# Patient Record
Sex: Female | Born: 1945
Health system: Southern US, Community
[De-identification: ages and names within clinical notes are randomized; demographics above are authoritative.]

## PROBLEM LIST (undated history)

## (undated) DIAGNOSIS — I10 Essential (primary) hypertension: Secondary | ICD-10-CM

## (undated) DIAGNOSIS — N189 Chronic kidney disease, unspecified: Secondary | ICD-10-CM

## (undated) DIAGNOSIS — G20 Parkinson's disease: Secondary | ICD-10-CM

## (undated) DIAGNOSIS — M109 Gout, unspecified: Secondary | ICD-10-CM

## (undated) DIAGNOSIS — I4891 Unspecified atrial fibrillation: Secondary | ICD-10-CM

## (undated) DIAGNOSIS — E785 Hyperlipidemia, unspecified: Secondary | ICD-10-CM

## (undated) DIAGNOSIS — D649 Anemia, unspecified: Secondary | ICD-10-CM

## (undated) DIAGNOSIS — G20A1 Parkinson's disease without dyskinesia, without mention of fluctuations: Secondary | ICD-10-CM

## (undated) HISTORY — DX: Chronic kidney disease, unspecified: N18.9

## (undated) HISTORY — PX: APPENDECTOMY: SHX54

## (undated) HISTORY — DX: Anemia, unspecified: D64.9

## (undated) HISTORY — DX: Gout, unspecified: M10.9

## (undated) HISTORY — DX: Essential (primary) hypertension: I10

## (undated) HISTORY — DX: Hyperlipidemia, unspecified: E78.5

---

## 1998-05-08 ENCOUNTER — Emergency Department (HOSPITAL_COMMUNITY): Admission: EM | Admit: 1998-05-08 | Discharge: 1998-05-08 | Payer: Self-pay | Admitting: Emergency Medicine

## 1999-06-26 ENCOUNTER — Encounter: Admission: RE | Admit: 1999-06-26 | Discharge: 1999-09-24 | Payer: Self-pay | Admitting: Family Medicine

## 2000-06-02 ENCOUNTER — Other Ambulatory Visit: Admission: RE | Admit: 2000-06-02 | Discharge: 2000-06-02 | Payer: Self-pay | Admitting: Family Medicine

## 2001-06-08 ENCOUNTER — Other Ambulatory Visit: Admission: RE | Admit: 2001-06-08 | Discharge: 2001-06-08 | Payer: Self-pay | Admitting: Family Medicine

## 2002-09-05 ENCOUNTER — Other Ambulatory Visit: Admission: RE | Admit: 2002-09-05 | Discharge: 2002-09-05 | Payer: Self-pay | Admitting: Family Medicine

## 2003-08-21 ENCOUNTER — Other Ambulatory Visit: Admission: RE | Admit: 2003-08-21 | Discharge: 2003-08-21 | Payer: Self-pay | Admitting: Family Medicine

## 2004-08-20 ENCOUNTER — Ambulatory Visit: Payer: Self-pay | Admitting: Family Medicine

## 2004-08-27 ENCOUNTER — Ambulatory Visit: Payer: Self-pay | Admitting: Family Medicine

## 2004-08-27 ENCOUNTER — Other Ambulatory Visit: Admission: RE | Admit: 2004-08-27 | Discharge: 2004-08-27 | Payer: Self-pay | Admitting: Family Medicine

## 2004-09-26 ENCOUNTER — Ambulatory Visit: Payer: Self-pay | Admitting: Family Medicine

## 2004-11-13 ENCOUNTER — Ambulatory Visit: Payer: Self-pay | Admitting: Family Medicine

## 2005-02-10 ENCOUNTER — Ambulatory Visit: Payer: Self-pay | Admitting: Family Medicine

## 2005-08-28 ENCOUNTER — Ambulatory Visit: Payer: Self-pay | Admitting: Family Medicine

## 2005-09-02 ENCOUNTER — Other Ambulatory Visit: Admission: RE | Admit: 2005-09-02 | Discharge: 2005-09-02 | Payer: Self-pay | Admitting: Family Medicine

## 2005-09-02 ENCOUNTER — Ambulatory Visit: Payer: Self-pay | Admitting: Family Medicine

## 2005-09-02 ENCOUNTER — Encounter: Admission: RE | Admit: 2005-09-02 | Discharge: 2005-09-02 | Payer: Self-pay | Admitting: Family Medicine

## 2005-09-11 ENCOUNTER — Ambulatory Visit: Payer: Self-pay | Admitting: Family Medicine

## 2005-11-24 ENCOUNTER — Ambulatory Visit: Payer: Self-pay | Admitting: Family Medicine

## 2005-12-02 ENCOUNTER — Ambulatory Visit: Payer: Self-pay | Admitting: Family Medicine

## 2006-06-01 ENCOUNTER — Ambulatory Visit: Payer: Self-pay | Admitting: Family Medicine

## 2006-09-04 ENCOUNTER — Ambulatory Visit: Payer: Self-pay | Admitting: Family Medicine

## 2006-09-04 LAB — CONVERTED CEMR LAB
ALT: 19 units/L (ref 0–40)
AST: 18 units/L (ref 0–37)
Albumin: 3.9 g/dL (ref 3.5–5.2)
Alkaline Phosphatase: 87 units/L (ref 39–117)
BUN: 23 mg/dL (ref 6–23)
Basophils Absolute: 0 10*3/uL (ref 0.0–0.1)
Basophils Relative: 0.4 % (ref 0.0–1.0)
CO2: 25 meq/L (ref 19–32)
Calcium: 9.7 mg/dL (ref 8.4–10.5)
Chloride: 108 meq/L (ref 96–112)
Chol/HDL Ratio, serum: 3.8
Cholesterol: 139 mg/dL (ref 0–200)
Creatinine, Ser: 1.2 mg/dL (ref 0.4–1.2)
Creatinine,U: 153.5 mg/dL
Eosinophil percent: 3.8 % (ref 0.0–5.0)
GFR calc non Af Amer: 49 mL/min
Glomerular Filtration Rate, Af Am: 59 mL/min/{1.73_m2}
Glucose, Bld: 139 mg/dL — ABNORMAL HIGH (ref 70–99)
HCT: 32.6 % — ABNORMAL LOW (ref 36.0–46.0)
HDL: 36.3 mg/dL — ABNORMAL LOW (ref >39.0)
Hemoglobin: 10.9 g/dL — ABNORMAL LOW (ref 12.0–15.0)
Hgb A1c MFr Bld: 6.7 % — ABNORMAL HIGH (ref 4.6–6.0)
LDL Cholesterol: 66 mg/dL (ref 0–99)
Lymphocytes Relative: 22 % (ref 12.0–46.0)
MCHC: 33.5 g/dL (ref 30.0–36.0)
MCV: 83.3 fL (ref 78.0–100.0)
Microalb Creat Ratio: 5.9 mg/g (ref 0.0–30.0)
Microalb, Ur: 0.9 mg/dL (ref 0.0–1.9)
Monocytes Absolute: 0.5 10*3/uL (ref 0.2–0.7)
Monocytes Relative: 6.6 % (ref 3.0–11.0)
Neutro Abs: 5.3 10*3/uL (ref 1.4–7.7)
Neutrophils Relative %: 67.2 % (ref 43.0–77.0)
Platelets: 242 10*3/uL (ref 150–400)
Potassium: 4.3 meq/L (ref 3.5–5.1)
RBC: 3.92 M/uL (ref 3.87–5.11)
RDW: 13.7 % (ref 11.5–14.6)
Sodium: 141 meq/L (ref 135–145)
TSH: 1.4 microintl units/mL (ref 0.35–5.50)
Total Bilirubin: 0.6 mg/dL (ref 0.3–1.2)
Total Protein: 6.8 g/dL (ref 6.0–8.3)
Triglyceride fasting, serum: 184 mg/dL — ABNORMAL HIGH (ref 0–149)
VLDL: 37 mg/dL (ref 0–40)
WBC: 7.8 10*3/uL (ref 4.5–10.5)

## 2006-09-11 ENCOUNTER — Ambulatory Visit: Payer: Self-pay | Admitting: Family Medicine

## 2006-09-11 ENCOUNTER — Other Ambulatory Visit: Admission: RE | Admit: 2006-09-11 | Discharge: 2006-09-11 | Payer: Self-pay | Admitting: Family Medicine

## 2006-09-11 ENCOUNTER — Encounter (INDEPENDENT_AMBULATORY_CARE_PROVIDER_SITE_OTHER): Payer: Self-pay | Admitting: Specialist

## 2006-09-11 LAB — CONVERTED CEMR LAB
Ferritin: 69.5 ng/mL (ref 10.0–291.0)
Folate: 9.7 ng/mL
Iron: 53 ug/dL (ref 42–145)
Saturation Ratios: 14.7 % — ABNORMAL LOW (ref 20.0–50.0)
Transferrin: 258.1 mg/dL (ref >=212.0)
Vitamin B-12: 277 pg/mL (ref 211–911)

## 2006-09-25 ENCOUNTER — Ambulatory Visit: Payer: Self-pay | Admitting: Family Medicine

## 2006-10-09 ENCOUNTER — Ambulatory Visit: Payer: Self-pay | Admitting: Family Medicine

## 2006-10-09 LAB — CONVERTED CEMR LAB
Basophils Absolute: 0.1 10*3/uL (ref 0.0–0.1)
Basophils Relative: 0.8 % (ref 0.0–1.0)
Eosinophil percent: 3.9 % (ref 0.0–5.0)
HCT: 29.7 % — ABNORMAL LOW (ref 36.0–46.0)
Hemoglobin: 10 g/dL — ABNORMAL LOW (ref 12.0–15.0)
Lymphocytes Relative: 20.5 % (ref 12.0–46.0)
MCHC: 33.5 g/dL (ref 30.0–36.0)
MCV: 83.4 fL (ref 78.0–100.0)
Monocytes Absolute: 0.4 10*3/uL (ref 0.2–0.7)
Monocytes Relative: 6.7 % (ref 3.0–11.0)
Neutro Abs: 4.4 10*3/uL (ref 1.4–7.7)
Neutrophils Relative %: 68.1 % (ref 43.0–77.0)
Platelets: 198 10*3/uL (ref 150–400)
RBC: 3.56 M/uL — ABNORMAL LOW (ref 3.87–5.11)
RDW: 13.7 % (ref 11.5–14.6)
WBC: 6.6 10*3/uL (ref 4.5–10.5)

## 2006-10-20 ENCOUNTER — Ambulatory Visit: Payer: Self-pay | Admitting: Oncology

## 2006-12-02 LAB — CBC WITH DIFFERENTIAL/PLATELET
BASO%: 0.4 % (ref 0.0–2.0)
Eosinophils Absolute: 0.3 10*3/uL (ref 0.0–0.5)
MCHC: 34.5 g/dL (ref 32.0–36.0)
MONO#: 0.4 10*3/uL (ref 0.1–0.9)
NEUT#: 5.1 10*3/uL (ref 1.5–6.5)
RBC: 3.71 10*6/uL (ref 3.70–5.32)
RDW: 14.5 % (ref 11.3–14.5)
WBC: 7.4 10*3/uL (ref 3.9–10.0)
lymph#: 1.6 10*3/uL (ref 0.9–3.3)

## 2006-12-02 LAB — CHCC SMEAR

## 2006-12-04 LAB — COMPREHENSIVE METABOLIC PANEL
ALT: 16 U/L (ref 0–35)
AST: 12 U/L (ref 0–37)
CO2: 21 mEq/L (ref 19–32)
Calcium: 9.6 mg/dL (ref 8.4–10.5)
Chloride: 109 mEq/L (ref 96–112)
Sodium: 140 mEq/L (ref 135–145)
Total Bilirubin: 0.4 mg/dL (ref 0.3–1.2)
Total Protein: 6.6 g/dL (ref 6.0–8.3)

## 2006-12-04 LAB — FOLATE: Folate: 10.5 ng/mL

## 2006-12-04 LAB — PROTEIN ELECTROPHORESIS, SERUM
Alpha-1-Globulin: 4.5 % (ref 2.9–4.9)
Alpha-2-Globulin: 13.7 % — ABNORMAL HIGH (ref 7.1–11.8)
Beta 2: 5.5 % (ref 3.2–6.5)
Gamma Globulin: 11.7 % (ref 11.1–18.8)

## 2006-12-04 LAB — IGG, IGA, IGM
IgA: 141 mg/dL (ref 68–378)
IgG (Immunoglobin G), Serum: 763 mg/dL (ref 694–1618)
IgM, Serum: 80 mg/dL (ref 60–263)

## 2006-12-04 LAB — ERYTHROPOIETIN: Erythropoietin: 9.9 m[IU]/mL (ref 2.6–34.0)

## 2006-12-04 LAB — VITAMIN B12: Vitamin B-12: 337 pg/mL (ref 211–911)

## 2006-12-04 LAB — IRON AND TIBC
TIBC: 332 ug/dL (ref 250–470)
UIBC: 279 ug/dL

## 2006-12-28 ENCOUNTER — Ambulatory Visit: Payer: Self-pay | Admitting: Oncology

## 2006-12-31 LAB — CBC WITH DIFFERENTIAL/PLATELET
Basophils Absolute: 0 10*3/uL (ref 0.0–0.1)
Eosinophils Absolute: 0.3 10*3/uL (ref 0.0–0.5)
HGB: 10.3 g/dL — ABNORMAL LOW (ref 11.6–15.9)
MONO#: 0.4 10*3/uL (ref 0.1–0.9)
NEUT#: 4.7 10*3/uL (ref 1.5–6.5)
RBC: 3.65 10*6/uL — ABNORMAL LOW (ref 3.70–5.32)
RDW: 14.4 % (ref 11.3–14.5)
WBC: 6.9 10*3/uL (ref 3.9–10.0)
lymph#: 1.6 10*3/uL (ref 0.9–3.3)

## 2007-03-15 ENCOUNTER — Ambulatory Visit: Payer: Self-pay | Admitting: Oncology

## 2007-03-17 LAB — CBC WITH DIFFERENTIAL/PLATELET
Basophils Absolute: 0 10*3/uL (ref 0.0–0.1)
Eosinophils Absolute: 0.3 10*3/uL (ref 0.0–0.5)
HCT: 29.7 % — ABNORMAL LOW (ref 34.8–46.6)
HGB: 10.3 g/dL — ABNORMAL LOW (ref 11.6–15.9)
LYMPH%: 23.5 % (ref 14.0–48.0)
MCV: 80.6 fL — ABNORMAL LOW (ref 81.0–101.0)
MONO#: 0.4 10*3/uL (ref 0.1–0.9)
NEUT#: 5.4 10*3/uL (ref 1.5–6.5)
NEUT%: 67.8 % (ref 39.6–76.8)
Platelets: 214 10*3/uL (ref 145–400)
RBC: 3.68 10*6/uL — ABNORMAL LOW (ref 3.70–5.32)
WBC: 7.9 10*3/uL (ref 3.9–10.0)

## 2007-07-26 ENCOUNTER — Ambulatory Visit: Payer: Self-pay | Admitting: Oncology

## 2007-07-28 LAB — CBC WITH DIFFERENTIAL/PLATELET
BASO%: 0.3 % (ref 0.0–2.0)
HCT: 28.7 % — ABNORMAL LOW (ref 34.8–46.6)
LYMPH%: 19.7 % (ref 14.0–48.0)
MCHC: 35 g/dL (ref 32.0–36.0)
MCV: 81.6 fL (ref 81.0–101.0)
MONO#: 0.4 10*3/uL (ref 0.1–0.9)
MONO%: 4.5 % (ref 0.0–13.0)
NEUT%: 72.3 % (ref 39.6–76.8)
Platelets: 222 10*3/uL (ref 145–400)
WBC: 8.2 10*3/uL (ref 3.9–10.0)

## 2007-09-14 ENCOUNTER — Ambulatory Visit: Payer: Self-pay | Admitting: Family Medicine

## 2007-09-14 LAB — CONVERTED CEMR LAB
ALT: 15 units/L (ref 0–35)
AST: 13 units/L (ref 0–37)
Albumin: 3.6 g/dL (ref 3.5–5.2)
Alkaline Phosphatase: 69 units/L (ref 39–117)
BUN: 16 mg/dL (ref 6–23)
Basophils Absolute: 0.1 10*3/uL (ref 0.0–0.1)
Basophils Relative: 0.9 % (ref 0.0–1.0)
Bilirubin Urine: NEGATIVE
Bilirubin, Direct: 0.1 mg/dL (ref 0.0–0.3)
Blood in Urine, dipstick: NEGATIVE
CO2: 26 meq/L (ref 19–32)
Calcium: 9.7 mg/dL (ref 8.4–10.5)
Chloride: 103 meq/L (ref 96–112)
Cholesterol: 143 mg/dL (ref 0–200)
Creatinine, Ser: 1.4 mg/dL — ABNORMAL HIGH (ref 0.4–1.2)
Creatinine,U: 126.3 mg/dL
Eosinophils Absolute: 0.4 10*3/uL (ref 0.0–0.6)
Eosinophils Relative: 4.4 % (ref 0.0–5.0)
GFR calc Af Amer: 49 mL/min
GFR calc non Af Amer: 40 mL/min
Glucose, Bld: 103 mg/dL — ABNORMAL HIGH (ref 70–99)
Glucose, Urine, Semiquant: NEGATIVE
HCT: 29.8 % — ABNORMAL LOW (ref 36.0–46.0)
HDL: 34.6 mg/dL — ABNORMAL LOW (ref >39.0)
Hemoglobin: 10.1 g/dL — ABNORMAL LOW (ref 12.0–15.0)
Hgb A1c MFr Bld: 6.3 % — ABNORMAL HIGH (ref 4.6–6.0)
Ketones, urine, test strip: NEGATIVE
LDL Cholesterol: 69 mg/dL (ref 0–99)
Lymphocytes Relative: 18.7 % (ref 12.0–46.0)
MCHC: 34 g/dL (ref 30.0–36.0)
MCV: 83.1 fL (ref 78.0–100.0)
Microalb Creat Ratio: 5.5 mg/g (ref 0.0–30.0)
Microalb, Ur: 0.7 mg/dL (ref 0.0–1.9)
Monocytes Absolute: 0.5 10*3/uL (ref 0.2–0.7)
Monocytes Relative: 5.8 % (ref 3.0–11.0)
Neutro Abs: 5.7 10*3/uL (ref 1.4–7.7)
Neutrophils Relative %: 70.2 % (ref 43.0–77.0)
Nitrite: NEGATIVE
Platelets: 204 10*3/uL (ref 150–400)
Potassium: 4.3 meq/L (ref 3.5–5.1)
Protein, U semiquant: NEGATIVE
RBC: 3.59 M/uL — ABNORMAL LOW (ref 3.87–5.11)
RDW: 13.5 % (ref 11.5–14.6)
Sodium: 139 meq/L (ref 135–145)
Specific Gravity, Urine: 1.015
TSH: 1.3 microintl units/mL (ref 0.35–5.50)
Total Bilirubin: 0.4 mg/dL (ref 0.3–1.2)
Total CHOL/HDL Ratio: 4.1
Total Protein: 6.1 g/dL (ref 6.0–8.3)
Triglycerides: 196 mg/dL — ABNORMAL HIGH (ref 0–149)
Urobilinogen, UA: 0.2
VLDL: 39 mg/dL (ref 0–40)
WBC Urine, dipstick: NEGATIVE
WBC: 8.2 10*3/uL (ref 4.5–10.5)
pH: 5

## 2007-09-21 ENCOUNTER — Other Ambulatory Visit: Admission: RE | Admit: 2007-09-21 | Discharge: 2007-09-21 | Payer: Self-pay | Admitting: Family Medicine

## 2007-09-21 ENCOUNTER — Encounter: Payer: Self-pay | Admitting: Family Medicine

## 2007-09-21 ENCOUNTER — Ambulatory Visit: Payer: Self-pay | Admitting: Family Medicine

## 2007-09-21 DIAGNOSIS — E782 Mixed hyperlipidemia: Secondary | ICD-10-CM | POA: Insufficient documentation

## 2007-09-21 DIAGNOSIS — D649 Anemia, unspecified: Secondary | ICD-10-CM | POA: Insufficient documentation

## 2007-09-21 DIAGNOSIS — E139 Other specified diabetes mellitus without complications: Secondary | ICD-10-CM | POA: Insufficient documentation

## 2007-09-21 DIAGNOSIS — I10 Essential (primary) hypertension: Secondary | ICD-10-CM | POA: Insufficient documentation

## 2007-12-20 ENCOUNTER — Ambulatory Visit: Payer: Self-pay | Admitting: Oncology

## 2007-12-22 LAB — FERRITIN: Ferritin: 64 ng/mL (ref 10–291)

## 2007-12-22 LAB — COMPREHENSIVE METABOLIC PANEL
ALT: 10 U/L (ref 0–35)
CO2: 22 mEq/L (ref 19–32)
Sodium: 142 mEq/L (ref 135–145)
Total Bilirubin: 0.4 mg/dL (ref 0.3–1.2)
Total Protein: 6.4 g/dL (ref 6.0–8.3)

## 2007-12-22 LAB — CBC WITH DIFFERENTIAL/PLATELET
BASO%: 0.3 % (ref 0.0–2.0)
LYMPH%: 19.6 % (ref 14.0–48.0)
MCHC: 34.6 g/dL (ref 32.0–36.0)
MONO#: 0.3 10*3/uL (ref 0.1–0.9)
Platelets: 212 10*3/uL (ref 145–400)
RBC: 3.61 10*6/uL — ABNORMAL LOW (ref 3.70–5.32)
WBC: 6.4 10*3/uL (ref 3.9–10.0)
lymph#: 1.3 10*3/uL (ref 0.9–3.3)

## 2007-12-22 LAB — IRON AND TIBC: Iron: 46 ug/dL (ref 42–145)

## 2008-03-21 ENCOUNTER — Ambulatory Visit: Payer: Self-pay | Admitting: Family Medicine

## 2008-03-21 ENCOUNTER — Ambulatory Visit: Payer: Self-pay | Admitting: Oncology

## 2008-03-21 LAB — CONVERTED CEMR LAB
BUN: 23 mg/dL (ref 6–23)
Basophils Absolute: 0 10*3/uL (ref 0.0–0.1)
Basophils Relative: 0.1 % (ref 0.0–1.0)
CO2: 23 meq/L (ref 19–32)
Calcium: 9.3 mg/dL (ref 8.4–10.5)
Chloride: 106 meq/L (ref 96–112)
Creatinine, Ser: 1.6 mg/dL — ABNORMAL HIGH (ref 0.4–1.2)
Eosinophils Absolute: 0.3 10*3/uL (ref 0.0–0.7)
Eosinophils Relative: 3.9 % (ref 0.0–5.0)
GFR calc Af Amer: 42 mL/min
GFR calc non Af Amer: 35 mL/min
Glucose, Bld: 112 mg/dL — ABNORMAL HIGH (ref 70–99)
HCT: 29.2 % — ABNORMAL LOW (ref 36.0–46.0)
Hemoglobin: 10 g/dL — ABNORMAL LOW (ref 12.0–15.0)
Hgb A1c MFr Bld: 6.4 % — ABNORMAL HIGH (ref 4.6–6.0)
Iron: 64 ug/dL (ref 42–145)
Lymphocytes Relative: 18 % (ref 12.0–46.0)
MCHC: 34.4 g/dL (ref 30.0–36.0)
MCV: 81.7 fL (ref 78.0–100.0)
Monocytes Absolute: 0.3 10*3/uL (ref 0.1–1.0)
Monocytes Relative: 4.6 % (ref 3.0–12.0)
Neutro Abs: 5.2 10*3/uL (ref 1.4–7.7)
Neutrophils Relative %: 73.4 % (ref 43.0–77.0)
Platelets: 267 10*3/uL (ref 150–400)
Potassium: 4.4 meq/L (ref 3.5–5.1)
RBC: 3.57 M/uL — ABNORMAL LOW (ref 3.87–5.11)
RDW: 13.3 % (ref 11.5–14.6)
Saturation Ratios: 19 % — ABNORMAL LOW (ref 20.0–50.0)
Sodium: 138 meq/L (ref 135–145)
Transferrin: 241.1 mg/dL (ref >=212.0)
WBC: 7.1 10*3/uL (ref 4.5–10.5)

## 2008-03-22 ENCOUNTER — Telehealth: Payer: Self-pay | Admitting: Family Medicine

## 2008-06-02 ENCOUNTER — Ambulatory Visit: Payer: Self-pay | Admitting: Oncology

## 2008-06-06 LAB — CBC WITH DIFFERENTIAL/PLATELET
BASO%: 0.5 % (ref 0.0–2.0)
EOS%: 4.5 % (ref 0.0–7.0)
LYMPH%: 26 % (ref 14.0–48.0)
MCHC: 34.6 g/dL (ref 32.0–36.0)
MONO#: 0.5 10*3/uL (ref 0.1–0.9)
Platelets: 205 10*3/uL (ref 145–400)
RBC: 3.76 10*6/uL (ref 3.70–5.32)
WBC: 7.7 10*3/uL (ref 3.9–10.0)
lymph#: 2 10*3/uL (ref 0.9–3.3)

## 2008-06-06 LAB — COMPREHENSIVE METABOLIC PANEL
ALT: 17 U/L (ref 0–35)
AST: 11 U/L (ref 0–37)
Alkaline Phosphatase: 74 U/L (ref 39–117)
CO2: 20 mEq/L (ref 19–32)
Sodium: 139 mEq/L (ref 135–145)
Total Bilirubin: 0.4 mg/dL (ref 0.3–1.2)
Total Protein: 6.7 g/dL (ref 6.0–8.3)

## 2008-06-06 LAB — IRON AND TIBC
%SAT: 22 % (ref 20–55)
TIBC: 330 ug/dL (ref 250–470)
UIBC: 256 ug/dL

## 2008-06-06 LAB — FERRITIN: Ferritin: 188 ng/mL (ref 10–291)

## 2008-07-18 ENCOUNTER — Ambulatory Visit: Payer: Self-pay | Admitting: Family Medicine

## 2008-09-01 ENCOUNTER — Ambulatory Visit: Payer: Self-pay | Admitting: Oncology

## 2008-09-05 LAB — COMPREHENSIVE METABOLIC PANEL
Alkaline Phosphatase: 75 U/L (ref 39–117)
BUN: 26 mg/dL — ABNORMAL HIGH (ref 6–23)
Glucose, Bld: 121 mg/dL — ABNORMAL HIGH (ref 70–99)
Total Bilirubin: 0.4 mg/dL (ref 0.3–1.2)

## 2008-09-05 LAB — CBC WITH DIFFERENTIAL/PLATELET
Basophils Absolute: 0.1 10*3/uL (ref 0.0–0.1)
Eosinophils Absolute: 0.3 10*3/uL (ref 0.0–0.5)
HGB: 10.5 g/dL — ABNORMAL LOW (ref 11.6–15.9)
LYMPH%: 22.5 % (ref 14.0–48.0)
MCV: 84.5 fL (ref 81.0–101.0)
MONO%: 5.4 % (ref 0.0–13.0)
NEUT#: 4.8 10*3/uL (ref 1.5–6.5)
Platelets: 212 10*3/uL (ref 145–400)

## 2008-09-05 LAB — FERRITIN: Ferritin: 171 ng/mL (ref 10–291)

## 2008-09-05 LAB — IRON AND TIBC
TIBC: 319 ug/dL (ref 250–470)
UIBC: 259 ug/dL

## 2008-09-14 ENCOUNTER — Ambulatory Visit: Payer: Self-pay | Admitting: Family Medicine

## 2008-09-14 LAB — CONVERTED CEMR LAB
ALT: 13 units/L (ref 0–35)
AST: 16 units/L (ref 0–37)
Albumin: 3.8 g/dL (ref 3.5–5.2)
Alkaline Phosphatase: 59 units/L (ref 39–117)
BUN: 24 mg/dL — ABNORMAL HIGH (ref 6–23)
Basophils Absolute: 0 10*3/uL (ref 0.0–0.1)
Basophils Relative: 0.6 % (ref 0.0–3.0)
Bilirubin Urine: NEGATIVE
Bilirubin, Direct: 0.1 mg/dL (ref 0.0–0.3)
Blood in Urine, dipstick: NEGATIVE
CO2: 26 meq/L (ref 19–32)
Calcium: 9.6 mg/dL (ref 8.4–10.5)
Chloride: 111 meq/L (ref 96–112)
Cholesterol: 126 mg/dL (ref 0–200)
Creatinine, Ser: 1.5 mg/dL — ABNORMAL HIGH (ref 0.4–1.2)
Creatinine,U: 162 mg/dL
Eosinophils Absolute: 0.2 10*3/uL (ref 0.0–0.7)
Eosinophils Relative: 3.7 % (ref 0.0–5.0)
GFR calc Af Amer: 45 mL/min
GFR calc non Af Amer: 37 mL/min
Glucose, Bld: 75 mg/dL (ref 70–99)
Glucose, Urine, Semiquant: NEGATIVE
HCT: 31.4 % — ABNORMAL LOW (ref 36.0–46.0)
HDL: 34.3 mg/dL — ABNORMAL LOW (ref >39.0)
Hemoglobin: 10.7 g/dL — ABNORMAL LOW (ref 12.0–15.0)
Hgb A1c MFr Bld: 6.2 % — ABNORMAL HIGH (ref 4.6–6.0)
Ketones, urine, test strip: NEGATIVE
LDL Cholesterol: 54 mg/dL (ref 0–99)
Lymphocytes Relative: 21.6 % (ref 12.0–46.0)
MCHC: 34.2 g/dL (ref 30.0–36.0)
MCV: 84.7 fL (ref 78.0–100.0)
Microalb Creat Ratio: 5.6 mg/g (ref 0.0–30.0)
Microalb, Ur: 0.9 mg/dL (ref 0.0–1.9)
Monocytes Absolute: 0.4 10*3/uL (ref 0.1–1.0)
Monocytes Relative: 7.1 % (ref 3.0–12.0)
Neutro Abs: 3.9 10*3/uL (ref 1.4–7.7)
Neutrophils Relative %: 67 % (ref 43.0–77.0)
Nitrite: NEGATIVE
Platelets: 195 10*3/uL (ref 150–400)
Potassium: 4.4 meq/L (ref 3.5–5.1)
RBC: 3.71 M/uL — ABNORMAL LOW (ref 3.87–5.11)
RDW: 13.4 % (ref 11.5–14.6)
Sodium: 144 meq/L (ref 135–145)
Specific Gravity, Urine: 1.02
TSH: 1.16 microintl units/mL (ref 0.35–5.50)
Total Bilirubin: 0.6 mg/dL (ref 0.3–1.2)
Total CHOL/HDL Ratio: 3.7
Total Protein: 6.7 g/dL (ref 6.0–8.3)
Triglycerides: 189 mg/dL — ABNORMAL HIGH (ref 0–149)
Urobilinogen, UA: 0.2
VLDL: 38 mg/dL (ref 0–40)
WBC: 5.8 10*3/uL (ref 4.5–10.5)
pH: 5

## 2008-10-03 ENCOUNTER — Ambulatory Visit: Payer: Self-pay | Admitting: Family Medicine

## 2008-10-03 ENCOUNTER — Other Ambulatory Visit: Admission: RE | Admit: 2008-10-03 | Discharge: 2008-10-03 | Payer: Self-pay | Admitting: Family Medicine

## 2009-03-02 ENCOUNTER — Ambulatory Visit: Payer: Self-pay | Admitting: Oncology

## 2009-03-06 LAB — COMPREHENSIVE METABOLIC PANEL
Albumin: 4.2 g/dL (ref 3.5–5.2)
BUN: 22 mg/dL (ref 6–23)
Calcium: 9.3 mg/dL (ref 8.4–10.5)
Chloride: 107 mEq/L (ref 96–112)
Creatinine, Ser: 1.57 mg/dL — ABNORMAL HIGH (ref 0.40–1.20)
Glucose, Bld: 163 mg/dL — ABNORMAL HIGH (ref 70–99)
Potassium: 4.6 mEq/L (ref 3.5–5.3)

## 2009-03-06 LAB — CBC WITH DIFFERENTIAL/PLATELET
Basophils Absolute: 0 10*3/uL (ref 0.0–0.1)
EOS%: 4.1 % (ref 0.0–7.0)
Eosinophils Absolute: 0.3 10*3/uL (ref 0.0–0.5)
HCT: 30.9 % — ABNORMAL LOW (ref 34.8–46.6)
HGB: 10.5 g/dL — ABNORMAL LOW (ref 11.6–15.9)
MCH: 28.7 pg (ref 25.1–34.0)
MCV: 84.3 fL (ref 79.5–101.0)
MONO%: 5.8 % (ref 0.0–14.0)
NEUT#: 4.2 10*3/uL (ref 1.5–6.5)
NEUT%: 66.5 % (ref 38.4–76.8)
RDW: 14.5 % (ref 11.2–14.5)
lymph#: 1.5 10*3/uL (ref 0.9–3.3)

## 2009-03-06 LAB — IRON AND TIBC
%SAT: 23 % (ref 20–55)
TIBC: 321 ug/dL (ref 250–470)
UIBC: 248 ug/dL

## 2009-03-20 ENCOUNTER — Ambulatory Visit: Payer: Self-pay | Admitting: Family Medicine

## 2009-03-20 LAB — CONVERTED CEMR LAB
BUN: 28 mg/dL — ABNORMAL HIGH (ref 6–23)
CO2: 25 meq/L (ref 19–32)
Calcium: 9.4 mg/dL (ref 8.4–10.5)
Chloride: 116 meq/L — ABNORMAL HIGH (ref 96–112)
Creatinine, Ser: 1.6 mg/dL — ABNORMAL HIGH (ref 0.4–1.2)
GFR calc non Af Amer: 34.46 mL/min (ref >60)
Glucose, Bld: 86 mg/dL (ref 70–99)
Hgb A1c MFr Bld: 6.1 % (ref 4.6–6.5)
Potassium: 4.7 meq/L (ref 3.5–5.1)
Sodium: 143 meq/L (ref 135–145)

## 2009-03-27 ENCOUNTER — Ambulatory Visit: Payer: Self-pay | Admitting: Family Medicine

## 2009-03-27 DIAGNOSIS — N189 Chronic kidney disease, unspecified: Secondary | ICD-10-CM | POA: Insufficient documentation

## 2009-06-20 ENCOUNTER — Encounter: Payer: Self-pay | Admitting: *Deleted

## 2009-07-30 ENCOUNTER — Ambulatory Visit: Payer: Self-pay | Admitting: Family Medicine

## 2009-08-13 ENCOUNTER — Ambulatory Visit: Payer: Self-pay | Admitting: Oncology

## 2009-08-15 LAB — CBC WITH DIFFERENTIAL/PLATELET
Basophils Absolute: 0 10*3/uL (ref 0.0–0.1)
HCT: 31.6 % — ABNORMAL LOW (ref 34.8–46.6)
HGB: 10.6 g/dL — ABNORMAL LOW (ref 11.6–15.9)
MCH: 28.7 pg (ref 25.1–34.0)
MONO#: 0.4 10*3/uL (ref 0.1–0.9)
NEUT%: 67.1 % (ref 38.4–76.8)
WBC: 6.9 10*3/uL (ref 3.9–10.3)
lymph#: 1.5 10*3/uL (ref 0.9–3.3)

## 2009-08-17 LAB — IRON AND TIBC
%SAT: 17 % — ABNORMAL LOW (ref 20–55)
Iron: 56 ug/dL (ref 42–145)
TIBC: 339 ug/dL (ref 250–470)
UIBC: 283 ug/dL

## 2009-08-17 LAB — SPEP & IFE WITH QIG
Albumin ELP: 58.4 % (ref 55.8–66.1)
Alpha-1-Globulin: 4.7 % (ref 2.9–4.9)
Beta 2: 4 % (ref 3.2–6.5)
Beta Globulin: 7.5 % — ABNORMAL HIGH (ref 4.7–7.2)
IgA: 127 mg/dL (ref 68–378)
Total Protein, Serum Electrophoresis: 6.6 g/dL (ref 6.0–8.3)

## 2009-08-17 LAB — COMPREHENSIVE METABOLIC PANEL
BUN: 28 mg/dL — ABNORMAL HIGH (ref 6–23)
CO2: 22 mEq/L (ref 19–32)
Calcium: 9.5 mg/dL (ref 8.4–10.5)
Chloride: 108 mEq/L (ref 96–112)
Creatinine, Ser: 1.61 mg/dL — ABNORMAL HIGH (ref 0.40–1.20)

## 2009-08-17 LAB — ERYTHROPOIETIN: Erythropoietin: 6.4 m[IU]/mL (ref 2.6–34.0)

## 2009-08-17 LAB — FERRITIN: Ferritin: 114 ng/mL (ref 10–291)

## 2009-08-20 ENCOUNTER — Encounter: Payer: Self-pay | Admitting: Family Medicine

## 2009-08-20 LAB — HM MAMMOGRAPHY

## 2009-09-28 ENCOUNTER — Ambulatory Visit: Payer: Self-pay | Admitting: Family Medicine

## 2009-09-28 LAB — CONVERTED CEMR LAB
ALT: 16 units/L (ref 0–35)
AST: 17 units/L (ref 0–37)
Albumin: 3.8 g/dL (ref 3.5–5.2)
Alkaline Phosphatase: 50 units/L (ref 39–117)
BUN: 27 mg/dL — ABNORMAL HIGH (ref 6–23)
Basophils Absolute: 0.1 10*3/uL (ref 0.0–0.1)
Basophils Relative: 0.8 % (ref 0.0–3.0)
Bilirubin Urine: NEGATIVE
Bilirubin, Direct: 0 mg/dL (ref 0.0–0.3)
Blood in Urine, dipstick: NEGATIVE
CO2: 24 meq/L (ref 19–32)
Calcium: 9.5 mg/dL (ref 8.4–10.5)
Chloride: 111 meq/L (ref 96–112)
Cholesterol: 122 mg/dL (ref 0–200)
Creatinine, Ser: 1.6 mg/dL — ABNORMAL HIGH (ref 0.4–1.2)
Creatinine,U: 140.1 mg/dL
Eosinophils Absolute: 0.4 10*3/uL (ref 0.0–0.7)
Eosinophils Relative: 5.5 % — ABNORMAL HIGH (ref 0.0–5.0)
GFR calc non Af Amer: 34.4 mL/min (ref >60)
Glucose, Bld: 87 mg/dL (ref 70–99)
Glucose, Urine, Semiquant: NEGATIVE
HCT: 30.9 % — ABNORMAL LOW (ref 36.0–46.0)
HDL: 40.5 mg/dL (ref >39.00)
Hemoglobin: 10.3 g/dL — ABNORMAL LOW (ref 12.0–15.0)
Hgb A1c MFr Bld: 6.4 % (ref 4.6–6.5)
Ketones, urine, test strip: NEGATIVE
LDL Cholesterol: 47 mg/dL (ref 0–99)
Lymphocytes Relative: 22.6 % (ref 12.0–46.0)
Lymphs Abs: 1.5 10*3/uL (ref 0.7–4.0)
MCHC: 33.5 g/dL (ref 30.0–36.0)
MCV: 87.7 fL (ref 78.0–100.0)
Microalb Creat Ratio: 7.1 mg/g (ref 0.0–30.0)
Microalb, Ur: 1 mg/dL (ref 0.0–1.9)
Monocytes Absolute: 0.4 10*3/uL (ref 0.1–1.0)
Monocytes Relative: 5.9 % (ref 3.0–12.0)
Neutro Abs: 4.3 10*3/uL (ref 1.4–7.7)
Neutrophils Relative %: 65.2 % (ref 43.0–77.0)
Nitrite: NEGATIVE
Platelets: 168 10*3/uL (ref 150.0–400.0)
Potassium: 4.8 meq/L (ref 3.5–5.1)
Protein, U semiquant: NEGATIVE
RBC: 3.52 M/uL — ABNORMAL LOW (ref 3.87–5.11)
RDW: 13.5 % (ref 11.5–14.6)
Sodium: 143 meq/L (ref 135–145)
Specific Gravity, Urine: 1.02
TSH: 0.86 microintl units/mL (ref 0.35–5.50)
Total Bilirubin: 0.6 mg/dL (ref 0.3–1.2)
Total CHOL/HDL Ratio: 3
Total Protein: 6.5 g/dL (ref 6.0–8.3)
Triglycerides: 173 mg/dL — ABNORMAL HIGH (ref 0.0–149.0)
Urobilinogen, UA: 0.2
VLDL: 34.6 mg/dL (ref 0.0–40.0)
WBC: 6.7 10*3/uL (ref 4.5–10.5)
pH: 5

## 2009-10-04 ENCOUNTER — Other Ambulatory Visit: Admission: RE | Admit: 2009-10-04 | Discharge: 2009-10-04 | Payer: Self-pay | Admitting: Family Medicine

## 2009-10-04 ENCOUNTER — Ambulatory Visit: Payer: Self-pay | Admitting: Family Medicine

## 2009-10-23 LAB — HM DIABETES EYE EXAM

## 2010-01-02 ENCOUNTER — Ambulatory Visit: Payer: Self-pay | Admitting: Internal Medicine

## 2010-01-02 DIAGNOSIS — M79609 Pain in unspecified limb: Secondary | ICD-10-CM | POA: Insufficient documentation

## 2010-01-02 LAB — CONVERTED CEMR LAB: Blood Glucose, Fingerstick: 124

## 2010-02-11 ENCOUNTER — Ambulatory Visit: Payer: Self-pay | Admitting: Oncology

## 2010-02-12 LAB — CBC WITH DIFFERENTIAL/PLATELET
BASO%: 0.5 % (ref 0.0–2.0)
Basophils Absolute: 0 10*3/uL (ref 0.0–0.1)
EOS%: 6.1 % (ref 0.0–7.0)
Eosinophils Absolute: 0.4 10*3/uL (ref 0.0–0.5)
HCT: 30.4 % — ABNORMAL LOW (ref 34.8–46.6)
HGB: 10.4 g/dL — ABNORMAL LOW (ref 11.6–15.9)
LYMPH%: 22.8 % (ref 14.0–49.7)
MCH: 28.5 pg (ref 25.1–34.0)
MCHC: 34.3 g/dL (ref 31.5–36.0)
MCV: 83.1 fL (ref 79.5–101.0)
MONO#: 0.4 10*3/uL (ref 0.1–0.9)
MONO%: 5.9 % (ref 0.0–14.0)
NEUT#: 4.5 10*3/uL (ref 1.5–6.5)
NEUT%: 64.7 % (ref 38.4–76.8)
Platelets: 222 10*3/uL (ref 145–400)
RBC: 3.67 10*6/uL — ABNORMAL LOW (ref 3.70–5.45)
RDW: 14.6 % — ABNORMAL HIGH (ref 11.2–14.5)
WBC: 7 10*3/uL (ref 3.9–10.3)
lymph#: 1.6 10*3/uL (ref 0.9–3.3)

## 2010-02-12 LAB — COMPREHENSIVE METABOLIC PANEL
ALT: 9 U/L (ref 0–35)
AST: 8 U/L (ref 0–37)
Albumin: 4.1 g/dL (ref 3.5–5.2)
Alkaline Phosphatase: 61 U/L (ref 39–117)
BUN: 19 mg/dL (ref 6–23)
CO2: 22 mEq/L (ref 19–32)
Calcium: 9.4 mg/dL (ref 8.4–10.5)
Chloride: 107 mEq/L (ref 96–112)
Creatinine, Ser: 1.51 mg/dL — ABNORMAL HIGH (ref 0.40–1.20)
Glucose, Bld: 166 mg/dL — ABNORMAL HIGH (ref 70–99)
Potassium: 4.6 mEq/L (ref 3.5–5.3)
Sodium: 139 mEq/L (ref 135–145)
Total Bilirubin: 0.2 mg/dL — ABNORMAL LOW (ref 0.3–1.2)
Total Protein: 6.4 g/dL (ref 6.0–8.3)

## 2010-02-25 LAB — FECAL OCCULT BLOOD, GUAIAC: Occult Blood: NEGATIVE

## 2010-03-27 ENCOUNTER — Ambulatory Visit: Payer: Self-pay | Admitting: Family Medicine

## 2010-03-27 LAB — CONVERTED CEMR LAB
BUN: 25 mg/dL — ABNORMAL HIGH (ref 6–23)
CO2: 25 meq/L (ref 19–32)
Calcium: 9.7 mg/dL (ref 8.4–10.5)
Chloride: 109 meq/L (ref 96–112)
Creatinine, Ser: 1.6 mg/dL — ABNORMAL HIGH (ref 0.4–1.2)
GFR calc non Af Amer: 35.37 mL/min (ref >60)
Glucose, Bld: 92 mg/dL (ref 70–99)
Hgb A1c MFr Bld: 6.5 % (ref 4.6–6.5)
Potassium: 5.1 meq/L (ref 3.5–5.1)
Sodium: 143 meq/L (ref 135–145)

## 2010-04-03 ENCOUNTER — Ambulatory Visit: Payer: Self-pay | Admitting: Family Medicine

## 2010-08-13 ENCOUNTER — Ambulatory Visit: Payer: Self-pay | Admitting: Oncology

## 2010-08-15 ENCOUNTER — Ambulatory Visit: Payer: Self-pay | Admitting: Family Medicine

## 2010-08-15 LAB — CBC WITH DIFFERENTIAL/PLATELET
BASO%: 0.4 % (ref 0.0–2.0)
Basophils Absolute: 0 10*3/uL (ref 0.0–0.1)
EOS%: 4.3 % (ref 0.0–7.0)
Eosinophils Absolute: 0.4 10*3/uL (ref 0.0–0.5)
HCT: 30.9 % — ABNORMAL LOW (ref 34.8–46.6)
HGB: 10.4 g/dL — ABNORMAL LOW (ref 11.6–15.9)
LYMPH%: 22.2 % (ref 14.0–49.7)
MCH: 28 pg (ref 25.1–34.0)
MCHC: 33.8 g/dL (ref 31.5–36.0)
MCV: 82.9 fL (ref 79.5–101.0)
MONO#: 0.5 10*3/uL (ref 0.1–0.9)
MONO%: 5.9 % (ref 0.0–14.0)
NEUT#: 6 10*3/uL (ref 1.5–6.5)
NEUT%: 67.2 % (ref 38.4–76.8)
Platelets: 227 10*3/uL (ref 145–400)
RBC: 3.73 10*6/uL (ref 3.70–5.45)
RDW: 14.9 % — ABNORMAL HIGH (ref 11.2–14.5)
WBC: 8.9 10*3/uL (ref 3.9–10.3)
lymph#: 2 10*3/uL (ref 0.9–3.3)

## 2010-08-19 LAB — COMPREHENSIVE METABOLIC PANEL
ALT: 12 U/L (ref 0–35)
Albumin: 4.6 g/dL (ref 3.5–5.2)
Alkaline Phosphatase: 65 U/L (ref 39–117)
CO2: 21 mEq/L (ref 19–32)
Potassium: 4 mEq/L (ref 3.5–5.3)
Sodium: 139 mEq/L (ref 135–145)
Total Bilirubin: 0.3 mg/dL (ref 0.3–1.2)
Total Protein: 6.8 g/dL (ref 6.0–8.3)

## 2010-08-19 LAB — SPEP & IFE WITH QIG
Beta 2: 4.8 % (ref 3.2–6.5)
Gamma Globulin: 11.5 % (ref 11.1–18.8)
IgA: 124 mg/dL (ref 68–378)
IgM, Serum: 67 mg/dL (ref 60–263)

## 2010-08-19 LAB — ERYTHROPOIETIN: Erythropoietin: 11.2 m[IU]/mL (ref 2.6–34.0)

## 2010-08-19 LAB — FERRITIN: Ferritin: 92 ng/mL (ref 10–291)

## 2010-08-19 LAB — IRON AND TIBC: %SAT: 15 % — ABNORMAL LOW (ref 20–55)

## 2010-08-21 ENCOUNTER — Encounter: Payer: Self-pay | Admitting: Family Medicine

## 2010-10-25 ENCOUNTER — Telehealth: Payer: Self-pay | Admitting: *Deleted

## 2010-11-04 ENCOUNTER — Other Ambulatory Visit: Payer: Self-pay | Admitting: Family Medicine

## 2010-11-04 ENCOUNTER — Telehealth: Payer: Self-pay | Admitting: Family Medicine

## 2010-11-04 ENCOUNTER — Ambulatory Visit
Admission: RE | Admit: 2010-11-04 | Discharge: 2010-11-04 | Payer: Self-pay | Source: Home / Self Care | Attending: Family Medicine | Admitting: Family Medicine

## 2010-11-04 ENCOUNTER — Other Ambulatory Visit
Admission: RE | Admit: 2010-11-04 | Discharge: 2010-11-04 | Payer: Self-pay | Source: Home / Self Care | Admitting: Family Medicine

## 2010-11-04 ENCOUNTER — Encounter: Payer: Self-pay | Admitting: Family Medicine

## 2010-11-04 LAB — BASIC METABOLIC PANEL
BUN: 30 mg/dL — ABNORMAL HIGH (ref 6–23)
CO2: 23 mEq/L (ref 19–32)
Calcium: 9.3 mg/dL (ref 8.4–10.5)
GFR: 32.86 mL/min — ABNORMAL LOW (ref >60.00)
Glucose, Bld: 142 mg/dL — ABNORMAL HIGH (ref 70–99)
Potassium: 4.6 mEq/L (ref 3.5–5.1)
Sodium: 137 mEq/L (ref 135–145)

## 2010-11-04 LAB — LIPID PANEL
Cholesterol: 131 mg/dL (ref 0–200)
HDL: 37.6 mg/dL — ABNORMAL LOW (ref >39.00)
LDL Cholesterol: 62 mg/dL (ref 0–99)
Total CHOL/HDL Ratio: 3
Triglycerides: 157 mg/dL — ABNORMAL HIGH (ref 0.0–149.0)
VLDL: 31.4 mg/dL (ref 0.0–40.0)

## 2010-11-04 LAB — CONVERTED CEMR LAB
Bilirubin Urine: NEGATIVE
Blood in Urine, dipstick: NEGATIVE
Glucose, Urine, Semiquant: NEGATIVE
Ketones, urine, test strip: NEGATIVE
Protein, U semiquant: NEGATIVE
Specific Gravity, Urine: 1.01
Urobilinogen, UA: 0.2
pH: 5

## 2010-11-04 LAB — MICROALBUMIN / CREATININE URINE RATIO
Creatinine,U: 123.9 mg/dL
Microalb Creat Ratio: 2.2 mg/g (ref 0.0–30.0)

## 2010-11-04 LAB — CBC WITH DIFFERENTIAL/PLATELET
Basophils Relative: 0.4 % (ref 0.0–3.0)
Eosinophils Absolute: 0.3 10*3/uL (ref 0.0–0.7)
Eosinophils Relative: 3.6 % (ref 0.0–5.0)
HCT: 30.5 % — ABNORMAL LOW (ref 36.0–46.0)
Hemoglobin: 10.6 g/dL — ABNORMAL LOW (ref 12.0–15.0)
Lymphocytes Relative: 19.7 % (ref 12.0–46.0)
Lymphs Abs: 1.5 10*3/uL (ref 0.7–4.0)
MCHC: 34.6 g/dL (ref 30.0–36.0)
Monocytes Absolute: 0.5 10*3/uL (ref 0.1–1.0)
Monocytes Relative: 6.1 % (ref 3.0–12.0)
Neutro Abs: 5.2 10*3/uL (ref 1.4–7.7)
Neutrophils Relative %: 70.2 % (ref 43.0–77.0)
RBC: 3.66 Mil/uL — ABNORMAL LOW (ref 3.87–5.11)
RDW: 14.9 % — ABNORMAL HIGH (ref 11.5–14.6)
WBC: 7.4 10*3/uL (ref 4.5–10.5)

## 2010-11-04 LAB — HM PAP SMEAR

## 2010-11-04 LAB — HEPATIC FUNCTION PANEL
ALT: 12 U/L (ref 0–35)
AST: 15 U/L (ref 0–37)
Albumin: 3.8 g/dL (ref 3.5–5.2)
Alkaline Phosphatase: 64 U/L (ref 39–117)
Total Protein: 6.5 g/dL (ref 6.0–8.3)

## 2010-11-04 LAB — TSH: TSH: 1.23 u[IU]/mL (ref 0.35–5.50)

## 2010-11-12 ENCOUNTER — Ambulatory Visit
Admission: RE | Admit: 2010-11-12 | Discharge: 2010-11-12 | Payer: Self-pay | Source: Home / Self Care | Attending: Family Medicine | Admitting: Family Medicine

## 2010-11-12 ENCOUNTER — Other Ambulatory Visit: Payer: Self-pay | Admitting: Family Medicine

## 2010-11-12 DIAGNOSIS — M109 Gout, unspecified: Secondary | ICD-10-CM | POA: Insufficient documentation

## 2010-11-12 LAB — CBC WITH DIFFERENTIAL/PLATELET
Basophils Absolute: 0 10*3/uL (ref 0.0–0.1)
Basophils Relative: 0.2 % (ref 0.0–3.0)
Eosinophils Absolute: 0.1 10*3/uL (ref 0.0–0.7)
Eosinophils Relative: 1.2 % (ref 0.0–5.0)
HCT: 29.8 % — ABNORMAL LOW (ref 36.0–46.0)
Hemoglobin: 10.2 g/dL — ABNORMAL LOW (ref 12.0–15.0)
Lymphocytes Relative: 14.6 % (ref 12.0–46.0)
Lymphs Abs: 1.5 10*3/uL (ref 0.7–4.0)
MCHC: 34.3 g/dL (ref 30.0–36.0)
MCV: 83.4 fl (ref 78.0–100.0)
Monocytes Absolute: 0.6 10*3/uL (ref 0.1–1.0)
Neutro Abs: 7.8 10*3/uL — ABNORMAL HIGH (ref 1.4–7.7)
Neutrophils Relative %: 78 % — ABNORMAL HIGH (ref 43.0–77.0)
Platelets: 203 10*3/uL (ref 150.0–400.0)
RBC: 3.57 Mil/uL — ABNORMAL LOW (ref 3.87–5.11)
RDW: 14.4 % (ref 11.5–14.6)
WBC: 10 10*3/uL (ref 4.5–10.5)

## 2010-11-12 NOTE — Assessment & Plan Note (Signed)
**Note De-Identified  Obfuscation** Summary: FLU SHOT/CJR  Nurse Visit   Review of Systems       Flu Vaccine Consent Questions     Do you have a history of severe allergic reactions to this vaccine? no    Any prior history of allergic reactions to egg and/or gelatin? no    Do you have a sensitivity to the preservative Thimersol? no    Do you have a past history of Guillan-Barre Syndrome? no    Do you currently have an acute febrile illness? no    Have you ever had a severe reaction to latex? no    Vaccine information given and explained to patient? yes    Are you currently pregnant? no    Lot Number:[id number]   Exp Date:04/12/2011   Site Given  Left Deltoid IM    Allergies: No Known Drug Allergies  Orders Added: 1)  Flu Vaccine 25yrs + MEDICARE PATIENTS [Q2039] 2)  Administration Flu vaccine - MCR [G0008]

## 2010-11-12 NOTE — Miscellaneous (Signed)
Summary: mammogram  Clinical Lists Changes  Observations: Added new observation of MAMMOGRAM: normal (08/19/2010 11:41)      Preventive Care Screening  Mammogram:    Date:  08/19/2010    Results:  normal

## 2010-11-12 NOTE — Assessment & Plan Note (Signed)
Summary: pt stubbed big toe/swollen/warm to touch/pt is diabetic/cjr   Vital Signs:  Patient profile:   65 year old female Menstrual status:  postmenopausal Weight:      222 pounds Temp:     98.6 degrees F BP sitting:   118 / 70  (right arm) Cuff size:   regular  Vitals Entered By: Duard Brady LPN (January 02, 2010 10:29 AM) CC: c/o jammed (L) toe Is Patient Diabetic? Yes CBG Result 124   CC:  c/o jammed (L) toe.  History of Present Illness: 65 year old patient to jammed her left great toe on furniture 4 days ago.  She is still having pain, swelling, and had a difficult time with ambulation.  She has treated hypertension and type 2 diabetes.  She has dyslipidemia  Preventive Screening-Counseling & Management  Alcohol-Tobacco     Smoking Status: quit  Allergies (verified): No Known Drug Allergies  Past History:  Past Medical History: Reviewed history from 09/21/2007 and no changes required. Anemia-NOS Diabetes mellitus, type II Hyperlipidemia Hypertension childbirth x 3 appendectomy  Social History: Smoking Status:  quit  Review of Systems       The patient complains of difficulty walking.    Physical Exam  General:  overweight-appearing.   Extremities:  left great toe was swollen, slightly erythematous and warm to touch.  No misalignment   Impression & Recommendations:  Problem # 1:  TOE PAIN (ICD-729.5)  Orders: T-Toe(s) (73660TC)  Complete Medication List: 1)  Lisinopril-hydrochlorothiazide 20-25 Mg Tabs (Lisinopril-hydrochlorothiazide) .... Take 1 tablet by mouth every morning 2)  Glipizide 10 Mg Tabs (Glipizide) .... Take 1 tablet by mouth two times a day 3)  Glucophage 1000 Mg Tabs (Metformin hcl) .... Take 1 tablet by mouth two times a day 4)  Zocor 20 Mg Tabs (Simvastatin) .[name].. 1 tab @ bedtime 5)  Valacyclovir Hcl 1 Gm Tabs (Valacyclovir hcl) .... Take 2 tabs two times a day as needed for fever blister 6)  Hydrocodone-acetaminophen 5-500  Mg Tabs (Hydrocodone-acetaminophen) .... One every 6 hours for pain  Other Orders: Capillary Blood Glucose/CBG (78295)  Patient Instructions: 1)  Vimovo- one twice daily 2)  x-ray as scheduled 3)  keep left foot elevated as much as possible Prescriptions: HYDROCODONE-ACETAMINOPHEN 5-500 MG TABS (HYDROCODONE-ACETAMINOPHEN) one every 6 hours for pain  #50 x 0   Entered and Authorized by:   Gordy Savers  MD   Signed by:   Gordy Savers  MD on 01/02/2010   Method used:   Print then Give to Patient   RxID:   [id number]

## 2010-11-12 NOTE — Assessment & Plan Note (Signed)
Summary: 6 month rov/njr   Vital Signs:  Patient profile:   65 year old female Menstrual status:  postmenopausal Weight:      220 pounds BMI:     37.03 Temp:     98.3 degrees F oral BP sitting:   116 / 78  (left arm) Cuff size:   regular  Vitals Entered By: Kern Reap CMA Duncan Dull) (April 03, 2010 8:36 AM) CC: follow-up visit   CC:  follow-up visit.  History of Present Illness: Sherri Pratt is a 65 year old female, who comes in today for follow-up of type 2 diabetes.  She is on Glucotrol 10 mg b.i.d., Glucophage 1000 mg b.i.d.  Fasting blood sugar 92.  A1c6 .5%.  Diabetes Management History:      She says that she is not exercising regularly.    Allergies (verified): No Known Drug Allergies  Past History:  Past medical, surgical, family and social histories (including risk factors) reviewed, and no changes noted (except as noted below).  Past Medical History: Reviewed history from 09/21/2007 and no changes required. Anemia-NOS Diabetes mellitus, type II Hyperlipidemia Hypertension childbirth x 3 appendectomy  Family History: Reviewed history from 09/21/2007 and no changes required. Family History of Colon CA 1st degree relative <60 both mother and father had colon cancer  Social History: Reviewed history from 09/21/2007 and no changes required. Married Never Smoked Alcohol use-no Drug use-no Regular exercise-no  Review of Systems      See HPI  Physical Exam  General:  Well-developed,well-nourished,in no acute distress; alert,appropriate and cooperative throughout examination   Impression & Recommendations:  Problem # 1:  DIABETES MELLITUS, TYPE II (ICD-250.00) Assessment Improved  Her updated medication list for this problem includes:    Lisinopril-hydrochlorothiazide 20-25 Mg Tabs (Lisinopril-hydrochlorothiazide) .[name]... Take 1 tablet by mouth every morning    Glipizide 10 Mg Tabs (Glipizide) .[name]... Take 1 tablet by mouth two times a day    Glucophage 1000  Mg Tabs (Metformin hcl) .[name]... Take 1 tablet by mouth two times a day  Complete Medication List: 1)  Lisinopril-hydrochlorothiazide 20-25 Mg Tabs (Lisinopril-hydrochlorothiazide) .... Take 1 tablet by mouth every morning 2)  Glipizide 10 Mg Tabs (Glipizide) .... Take 1 tablet by mouth two times a day 3)  Glucophage 1000 Mg Tabs (Metformin hcl) .... Take 1 tablet by mouth two times a day 4)  Zocor 20 Mg Tabs (Simvastatin) .[name].. 1 tab @ bedtime 5)  Valacyclovir Hcl 1 Gm Tabs (Valacyclovir hcl) .... Take 2 tabs two times a day as needed for fever blister  Patient Instructions: 1)  continue current medication.  Since her tell is better resembling a walking program 30 minutes daily.  Return in 6 months for your annual complete exam

## 2010-11-12 NOTE — Letter (Signed)
Summary: [facility]  [facility]   Imported By: Maryln Gottron 08/26/2010 09:22:45  _____________________________________________________________________  External Attachment:    Type:   Image     Comment:   External Document

## 2010-11-13 ENCOUNTER — Encounter: Payer: Self-pay | Admitting: Family Medicine

## 2010-11-14 NOTE — Assessment & Plan Note (Signed)
Summary: pt will come in fasting/njrq/pt rsc from bmp/cjr rsc bmp/njr   Vital Signs:  Patient profile:   65 year old female Menstrual status:  postmenopausal Height:      64.75 inches Weight:      215 pounds Temp:     97.6 degrees F oral BP sitting:   130 / 80  (left arm) Cuff size:   regular  Vitals Entered By: Kern Reap CMA Duncan Dull) (November 04, 2010 9:40 AM) CC: wellness exam Is Patient Diabetic? No   CC:  wellness exam.  History of Present Illness: Sherri Pratt is a 65 year old, married female, nonsmoker, who comes in today for Medicare wellness examination because of underlying hypertension, diabetes, and hyperlipidemia, and obesity.  Her medications were reviewed in detail.  There been no changes.  Her weight is down 5 pounds.  She started a walking program.  Encouraged her to continue to do that 30 minutes daily.  Her last eye exam was January 2011 normal by ophthalmologist, regular dental care, BSE monthly, any mammography, colonoscopy, normal, tetanus, 2009, seasonal flu 2011, Pneumovax 2010, information given on shingles Here for Medicare AWV:  1.   Risk factors based on Past M, S, F history:......reviewed no changes except for weight loss 2.   Physical Activities: walking daily now 3.   Depression/mood: good mood.  No depression 4.   Hearing: normal 5.   ADL's: functions independently 6.   Fall Risk: reviewed.  None identified 7.   Home Safety: no guns in the house 8.   Height, weight, &visual acuity:height vision.  Normal weight loss as noted above 9.   Counseling: continue good health habits walking and weight loss 10.   Labs ordered based on risk factors: done today 11.           Referral Coordination....none identified 12.           Care Plan......continue current medicines follow-up in 3 months 13.            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  Past Medical History: Reviewed history from 09/21/2007 and no changes required. Anemia-NOS Diabetes mellitus, type II Hyperlipidemia Hypertension childbirth x 3 appendectomy  Family History: Reviewed history from 09/21/2007 and no changes required. Family History of Colon CA 1st degree relative <60 both mother and father had colon cancer  Social History: Reviewed history from 09/21/2007 and no changes required. Married Never Smoked Alcohol use-no Drug use-no Regular exercise-no  Review of Systems      See HPI  Physical Exam  General:  Well-developed,well-nourished,in no acute distress; alert,appropriate and cooperative throughout examination Head:  Normocephalic and atraumatic without obvious abnormalities. No apparent alopecia or balding. Eyes:  No corneal or conjunctival inflammation noted. EOMI. Perrla. Funduscopic exam benign, without hemorrhages, exudates or papilledema. Vision grossly normal. Ears:  External ear exam shows no significant lesions or deformities.  Otoscopic examination reveals clear canals, tympanic membranes are intact bilaterally without bulging, retraction, inflammation or discharge. Hearing is grossly normal bilaterally. Nose:  External nasal examination shows no deformity or inflammation. Nasal mucosa are pink and moist without lesions or exudates. Mouth:  Oral mucosa and oropharynx without lesions or exudates.  Teeth in good repair. Neck:  No deformities, masses, or tenderness noted. Chest Wall:  No deformities, masses, or tenderness noted. Breasts:  No mass, nodules, thickening, tenderness, bulging, retraction, inflamation, nipple discharge or skin changes noted.   Lungs:  Normal respiratory effort, chest expands symmetrically. Lungs are clear to auscultation, no crackles or wheezes. Heart:  Normal rate and regular rhythm. S1 and S2 normal without  gallop, murmur, click, rub or other extra sounds. Abdomen:  Bowel sounds positive,abdomen soft and non-tender without masses, organomegaly or hernias noted. Rectal:  No external abnormalities noted. Normal sphincter tone. No rectal masses or tenderness. Genitalia:  Pelvic Exam:        External: normal female genitalia without lesions or masses        Vagina: normal without lesions or masses        Cervix: normal without lesions or masses        Adnexa: normal bimanual exam without masses or fullness        Uterus: normal by palpation        Pap smear: performed Msk:  No deformity or scoliosis noted of thoracic or lumbar spine.   Pulses:  R and L carotid,radial,femoral,dorsalis pedis and posterior tibial pulses are full and equal bilaterally Extremities:  No clubbing, cyanosis, edema, or deformity noted with normal full range of motion of all joints.   Neurologic:  No cranial nerve deficits noted. Station and gait are normal. Plantar reflexes are down-going bilaterally. DTRs are symmetrical throughout. Sensory, motor and coordinative functions appear intact. Skin:  Intact without suspicious lesions or rashes Cervical Nodes:  No lymphadenopathy noted Axillary Nodes:  No palpable lymphadenopathy Inguinal Nodes:  No significant adenopathy Psych:  Cognition and judgment appear intact. Alert and cooperative with normal attention span and concentration. No apparent delusions, illusions, hallucinations  Diabetes Management Exam:    Foot Exam (with socks and/or shoes not present):       Sensory-Pinprick/Light touch:          Left medial foot (L-4): normal          Left dorsal foot (L-5): normal          Left lateral foot (S-1): normal          Right medial foot (L-4): normal          Right dorsal foot (L-5): normal          Right lateral foot (S-1): normal       Sensory-Monofilament:          Left foot: normal          Right foot: normal       Inspection:          Left foot: normal           Right foot: normal       Nails:          Left foot: normal          Right foot: normal    Eye Exam:       Eye Exam done elsewhere          Date: 10/23/2009          Results: normal          Done by: opth   Impression & Recommendations:  Problem # 1:  HYPERTENSION (ICD-401.9) Assessment Improved  Her updated medication list for this problem includes:    Lisinopril-hydrochlorothiazide 20-25 Mg Tabs (Lisinopril-hydrochlorothiazide) .[name]... Take 1 tablet by mouth every morning  Orders: Venipuncture (47425) TLB-Lipid Panel (80061-LIPID) TLB-BMP (Basic Metabolic Panel-BMET) (80048-METABOL) TLB-CBC Platelet - w/Differential (85025-CBCD) TLB-Hepatic/Liver Function Pnl (80076-HEPATIC) TLB-TSH (Thyroid  Stimulating Hormone) (84443-TSH) TLB-A1C / Hgb A1C (Glycohemoglobin) (83036-A1C) TLB-Microalbumin/Creat Ratio, Urine (82043-MALB) Prescription Created Electronically [phone]) Medicare -1st Annual Wellness Visit [phone]) Urinalysis-dipstick only (Medicare patient) (32440NU) Specimen Handling (27253)  Problem # 2:  HYPERLIPIDEMIA (ICD-272.4) Assessment: Improved  Her updated medication list for this problem includes:    Zocor 20 Mg Tabs (Simvastatin) .[name]... 1 tab @ bedtime  Orders: Venipuncture (66440) TLB-Lipid Panel (80061-LIPID) TLB-BMP (Basic Metabolic Panel-BMET) (80048-METABOL) TLB-CBC Platelet - w/Differential (85025-CBCD) TLB-Hepatic/Liver Function Pnl (80076-HEPATIC) TLB-TSH (Thyroid Stimulating Hormone) (84443-TSH) TLB-A1C / Hgb A1C (Glycohemoglobin) (83036-A1C) TLB-Microalbumin/Creat Ratio, Urine (82043-MALB) Prescription Created Electronically [phone]) Medicare -1st Annual Wellness Visit [phone]) Urinalysis-dipstick only (Medicare patient) (87564PP) Specimen Handling (29518)  Problem # 3:  DIABETES MELLITUS, TYPE II (ICD-250.00) Assessment: Improved  Her updated medication list for this problem includes:    Lisinopril-hydrochlorothiazide 20-25 Mg Tabs  (Lisinopril-hydrochlorothiazide) .[name]... Take 1 tablet by mouth every morning    Glipizide 10 Mg Tabs (Glipizide) .[name]... Take 1 tablet by mouth two times a day    Glucophage 1000 Mg Tabs (Metformin hcl) .[name]... Take 1 tablet by mouth two times a day  Orders: Venipuncture (84166) TLB-Lipid Panel (80061-LIPID) TLB-BMP (Basic Metabolic Panel-BMET) (80048-METABOL) TLB-CBC Platelet - w/Differential (85025-CBCD) TLB-Hepatic/Liver Function Pnl (80076-HEPATIC) TLB-TSH (Thyroid Stimulating Hormone) (84443-TSH) TLB-A1C / Hgb A1C (Glycohemoglobin) (83036-A1C) TLB-Microalbumin/Creat Ratio, Urine (82043-MALB) Prescription Created Electronically [id number]) Medicare -1st Annual Wellness Visit [phone]) Urinalysis-dipstick only (Medicare patient) (32355DD) Specimen Handling (22025)  Problem # 4:  PHYSICAL EXAMINATION (ICD-V70.0) Assessment: Unchanged  Orders: Venipuncture (42706) TLB-Lipid Panel (80061-LIPID) TLB-BMP (Basic Metabolic Panel-BMET) (80048-METABOL) TLB-CBC Platelet - w/Differential (85025-CBCD) TLB-Hepatic/Liver Function Pnl (80076-HEPATIC) TLB-TSH (Thyroid Stimulating Hormone) (84443-TSH) TLB-A1C / Hgb A1C (Glycohemoglobin) (83036-A1C) TLB-Microalbumin/Creat Ratio, Urine (82043-MALB) Prescription Created Electronically [phone]) Medicare -1st Annual Wellness Visit [phone]) Urinalysis-dipstick only (Medicare patient) (76160VP) Specimen Handling (71062)  Complete Medication List: 1)  Lisinopril-hydrochlorothiazide 20-25 Mg Tabs (Lisinopril-hydrochlorothiazide) .... Take 1 tablet by mouth every morning 2)  Glipizide 10 Mg Tabs (Glipizide) .... Take 1 tablet by mouth two times a day 3)  Glucophage 1000 Mg Tabs (Metformin hcl) .... Take 1 tablet by mouth two times a day 4)  Zocor 20 Mg Tabs (Simvastatin) .[name].. 1 tab @ bedtime 5)  Valacyclovir Hcl 1 Gm Tabs (Valacyclovir hcl) .... Take 2 tabs two times a day as needed for fever blister  Patient Instructions: 1)  Please schedule a follow-up  appointment in 3 months. 2)  Check your blood sugars regularly. If your readings are usually above : or below 70 you should contact our office. 3)  It is important that your Diabetic A1c level is checked every 3 months. 4)  See your eye doctor yearly to check for diabetic eye damage. 5)  Check your feet each night for sore areas, calluses or signs of infection. 6)  Check your Blood Pressure regularly. If it is above: you should make an appointment. 7)  BMP prior to visit, ICD-9: 8)  HbgA1C prior to visit, ICD-9: Prescriptions: ZOCOR 20 MG  TABS (SIMVASTATIN) 1 tab @ bedtime  #100 x 3   Entered and Authorized by:   Roderick Pee MD   Signed by:   Roderick Pee MD on 11/04/2010   Method used:   Electronically to        [facility]* (retail)       [address]       [place], [place]  [address]       Ph: [phone]       Fax: [phone]  RxID:   [id number] GLUCOPHAGE 1000 MG  TABS (METFORMIN HCL) Take 1 tablet by mouth two times a day  #200 x 3   Entered and Authorized by:   Roderick Pee MD   Signed by:   Roderick Pee MD on 11/04/2010   Method used:   Electronically to        [facility]* (retail)       [address]       [place], [place]  [address]       Ph: [phone]       Fax: [phone]   RxID:   [id number] GLIPIZIDE 10 MG  TABS (GLIPIZIDE) Take 1 tablet by mouth two times a day  #200 x 3   Entered and Authorized by:   Roderick Pee MD   Signed by:   Roderick Pee MD on 11/04/2010   Method used:   Electronically to        [facility]* (retail)       [address]       [place], [place]  [address]       Ph: [phone]       Fax: [phone]   RxID:   [id number] LISINOPRIL-HYDROCHLOROTHIAZIDE 20-25 MG  TABS (LISINOPRIL-HYDROCHLOROTHIAZIDE) Take 1 tablet by mouth every morning  #100 x 3   Entered and Authorized by:   Roderick Pee MD   Signed by:   Roderick Pee MD on 11/04/2010   Method used:   Electronically to         [facility]* (retail)       [address]       [place], [place]  [address]       Ph: [phone]       Fax: [phone]   RxID:   [id number]    Orders Added: 1)  Venipuncture [48546] 2)  TLB-Lipid Panel [80061-LIPID] 3)  TLB-BMP (Basic Metabolic Panel-BMET) [80048-METABOL] 4)  TLB-CBC Platelet - w/Differential [85025-CBCD] 5)  TLB-Hepatic/Liver Function Pnl [80076-HEPATIC] 6)  TLB-TSH (Thyroid Stimulating Hormone) [84443-TSH] 7)  TLB-A1C / Hgb A1C (Glycohemoglobin) [83036-A1C] 8)  TLB-Microalbumin/Creat Ratio, Urine [82043-MALB] 9)  Prescription Created Electronically [G8553] 10)  Medicare -1st Annual Wellness Visit [G0438] 11)  Urinalysis-dipstick only (Medicare patient) [81003QW] 12)  Specimen Handling [99000]       Laboratory Results   Urine Tests    Routine Urinalysis   Color: yellow Appearance: Clear Glucose: negative   (Normal Range: Negative) Bilirubin: negative   (Normal Range: Negative) Ketone: negative   (Normal Range: Negative) Spec. Gravity: 1.010   (Normal Range: 1.003-1.035) Blood: negative   (Normal Range: Negative) pH: 5.0   (Normal Range: 5.0-8.0) Protein: negative   (Normal Range: Negative) Urobilinogen: 0.2   (Normal Range: 0-1) Nitrite: negative   (Normal Range: Negative) Leukocyte Esterace: trace   (Normal Range: Negative)    Comments: Rita Ohara  November 04, 2010 12:10 PM      Appended Document: Orders Update    Clinical Lists Changes  Orders: Added new Service order of EKG w/ Interpretation (93000) - Signed

## 2010-11-14 NOTE — Progress Notes (Signed)
  Phone Note Outgoing Call   Summary of Call: I called Sherri Pratt to review her lab work hemoglobin 10.6, which is about, what it's been before.  She's been followed at the oncology center by Dr. Nida Boatman........ also GFR down to 27............. stop lisinopril, drink lots of water.  Follow-up in 3 weeks Initial call taken by: Roderick Pee MD,  November 04, 2010 5:29 PM

## 2010-11-14 NOTE — Progress Notes (Signed)
Summary: refill  Phone Note From Pharmacy   Caller: [facility] pharmacy Reason for Call: Needs renewal Details for Reason: metformin, lisinopril/hctz and simvastatin Initial call taken by: Romualdo Bolk, CMA Duncan Dull),  October 25, 2010 2:24 PM  Follow-up for Phone Call        Rx sent to pharmacy Follow-up by: Romualdo Bolk, CMA (AAMA),  October 25, 2010 2:25 PM    Prescriptions: ZOCOR 20 MG  TABS (SIMVASTATIN) 1 tab @ bedtime  #100 x 0   Entered by:   Romualdo Bolk, CMA (AAMA)   Authorized by:   Roderick Pee MD   Signed by:   Romualdo Bolk, CMA (AAMA) on 10/25/2010   Method used:   Electronically to        [facility]* (retail)       [address]       [place], [place]  [address]       Ph: [phone]       Fax: [phone]   RxID:   [id number] GLUCOPHAGE 1000 MG  TABS (METFORMIN HCL) Take 1 tablet by mouth two times a day  #200 x 0   Entered by:   Romualdo Bolk, CMA (AAMA)   Authorized by:   Roderick Pee MD   Signed by:   Romualdo Bolk, CMA (AAMA) on 10/25/2010   Method used:   Electronically to        [facility]* (retail)       [address]       [place], [place]  [address]       Ph: [phone]       Fax: [phone]   RxID:   [id number] LISINOPRIL-HYDROCHLOROTHIAZIDE 20-25 MG  TABS (LISINOPRIL-HYDROCHLOROTHIAZIDE) Take 1 tablet by mouth every morning  #100 x 0   Entered by:   Romualdo Bolk, CMA (AAMA)   Authorized by:   Roderick Pee MD   Signed by:   Romualdo Bolk, CMA (AAMA) on 10/25/2010   Method used:   Electronically to        [facility]* (retail)       [address]       [place], [place]  [address]       Ph: [phone]       Fax: [phone]   RxID:   [id number]

## 2010-11-20 NOTE — Assessment & Plan Note (Signed)
Summary: foot swollen,righr wrist swollen//ccm   Vital Signs:  Patient profile:   65 year old female Menstrual status:  postmenopausal Temp:     98.1 degrees F oral BP sitting:   140 / 80  (left arm) Cuff size:   regular  Vitals Entered By: Kern Reap CMA Duncan Dull) (November 12, 2010 11:47 AM) CC: left ankle and right wrist pain   CC:  left ankle and right wrist pain.  History of Present Illness: Sherri Pratt is a 65 year old, married female, nonsmoker, who comes in today for evaluation of pain in her right wrist and her left great toe for 4 days.  She states Saturday afternoon she all of a sudden developed pain in her left great toe in her right wrist.  They been swollen, red, and hot.  Pain on a scale of one to 10 as a 10.  She is not been able to sle of the severe pain.  No previous history or gallop.  Family history negative  Allergies: No Known Drug Allergies  Past History:  Past medical, surgical, family and social histories (including risk factors) reviewed for relevance to current acute and chronic problems.  Past Medical History: Reviewed history from 09/21/2007 and no changes required. Anemia-NOS Diabetes mellitus, type II Hyperlipidemia Hypertension childbirth x 3 appendectomy  Family History: Reviewed history from 09/21/2007 and no changes required. Family History of Colon CA 1st degree relative <60 both mother and father had colon cancer  Social History: Reviewed history from 09/21/2007 and no changes required. Married Never Smoked Alcohol use-no Drug use-no Regular exercise-no  Review of Systems      See HPI  Physical Exam  General:  Well-developed,well-nourished,in no acute distress; alert,appropriate and cooperative throughout examination Msk:  there is redness and swelling of the left great toe also right wrist   Problems:  Medical Problems Added: 1)  Dx of Gout  (ICD-274.9)  Impression & Recommendations:  Problem # 1:  GOUT  (ICD-274.9) Assessment New  Orders: Venipuncture ([address]) TLB-CBC Platelet - w/Differential (85025-CBCD) TLB-Uric Acid, Blood (84550-URIC) Prescription Created Electronically (J8119)  Complete Medication List: 1)  Glipizide 10 Mg Tabs (Glipizide) .... Take 1 tablet by mouth two times a day 2)  Glucophage 1000 Mg Tabs (Metformin hcl) .... Take 1 tablet by mouth two times a day 3)  Zocor 20 Mg Tabs (Simvastatin) .[name].. 1 tab @ bedtime 4)  Valacyclovir Hcl 1 Gm Tabs (Valacyclovir hcl) .... Take 2 tabs two times a day as needed for fever blister 5)  Prednisone 10 Mg Tabs (Prednisone) .... Uad 6)  Vicodin Es 7.5-750 Mg Tabs (Hydrocodone-acetaminophen) .... 1/2 to 1 q 4h as needed pain  Patient Instructions: 1)  begin prednisone, take two tabs x 3 days, one x 3 days, a half x 3 days, then a half a tablet every other day for two week taper.  Return in one week for follow-up Prescriptions: VICODIN ES 7.5-750 MG TABS (HYDROCODONE-ACETAMINOPHEN) 1/2 to 1 q 4h as needed pain  #30 x 1   Entered and Authorized by:   Roderick Pee MD   Signed by:   Roderick Pee MD on 11/12/2010   Method used:   Print then Give to Patient   RxID:   [id number] PREDNISONE 10 MG TABS (PREDNISONE) UAD  #50 x 1   Entered and Authorized by:   Roderick Pee MD   Signed by:   Roderick Pee MD on 11/12/2010   Method used:   Electronically to  [facility]* (retail)       [address]       [place], [place]  [address]       Ph: [phone]       Fax: [phone]   RxID:   [id number]    Orders Added: 1)  Venipuncture [40102] 2)  TLB-CBC Platelet - w/Differential [85025-CBCD] 3)  TLB-Uric Acid, Blood [84550-URIC] 4)  Prescription Created Electronically [G8553] 5)  Est. Patient Level III [72536]

## 2010-11-20 NOTE — Miscellaneous (Signed)
  Clinical Lists Changes  Medications: Added new medication of ALLOPURINOL 300 MG TABS (ALLOPURINOL) take one tab by mouth once daily - Signed Rx of ALLOPURINOL 300 MG TABS (ALLOPURINOL) take one tab by mouth once daily;  #100 x 3;  Signed;  Entered by: Kern Reap CMA (AAMA);  Authorized by: Roderick Pee MD;  Method used: Electronically to [facility]*, [address], [place], [place]  [address], Ph: [phone], Fax: [phone]    Prescriptions: ALLOPURINOL 300 MG TABS (ALLOPURINOL) take one tab by mouth once daily  #100 x 3   Entered by:   Kern Reap CMA (AAMA)   Authorized by:   Roderick Pee MD   Signed by:   Kern Reap CMA (AAMA) on 11/13/2010   Method used:   Electronically to        [facility]* (retail)       [address]       [place], [place]  [address]       Ph: [phone]       Fax: [phone]   RxID:   [id number]

## 2010-11-22 ENCOUNTER — Encounter: Payer: Self-pay | Admitting: Family Medicine

## 2010-11-25 ENCOUNTER — Encounter: Payer: Self-pay | Admitting: Family Medicine

## 2010-11-25 ENCOUNTER — Ambulatory Visit (INDEPENDENT_AMBULATORY_CARE_PROVIDER_SITE_OTHER): Payer: Medicare Other | Admitting: Family Medicine

## 2010-11-25 DIAGNOSIS — M109 Gout, unspecified: Secondary | ICD-10-CM

## 2010-11-25 DIAGNOSIS — N189 Chronic kidney disease, unspecified: Secondary | ICD-10-CM

## 2010-11-25 LAB — BASIC METABOLIC PANEL
BUN: 16 mg/dL (ref 6–23)
CO2: 26 mEq/L (ref 19–32)
Calcium: 9 mg/dL (ref 8.4–10.5)
Chloride: 104 mEq/L (ref 96–112)
Creatinine, Ser: 1.4 mg/dL — ABNORMAL HIGH (ref 0.4–1.2)
GFR: 41 mL/min — ABNORMAL LOW (ref >60.00)
Glucose, Bld: 113 mg/dL — ABNORMAL HIGH (ref 70–99)
Potassium: 5.1 mEq/L (ref 3.5–5.1)

## 2010-11-25 LAB — URIC ACID: Uric Acid, Serum: 4.7 mg/dL (ref 2.4–7.0)

## 2010-11-25 NOTE — Patient Instructions (Signed)
Continue your current medications.  We will call with further instructions.  Once we get her lab work back

## 2010-11-25 NOTE — Progress Notes (Signed)
  Subjective:    Patient ID: Sherri Pratt, female    DOB: June 16, 1946, 65 y.o.   MRN: [id number]  HPI  Sherri Pratt is a 65 year old female, who comes in today for follow-up of two problems.  We saw her a week ago with a severely swollen, red, left great toe.  We felt like it was gout.  Uric acid level was 9.3.  We start her on allopurinol 300 daily, and anti-inflammatories.  Now the pain, redness and swelling are gone.  Advised to take gallop.  There are no everyday for over  Her physical labs showed some marked decrease in her GFR to 32,,,,,, to go to repeat her labs today  Review of Systems    Negative Objective:   Physical Exam    Well-developed well-nourished, female, in no acute distress.  Examination of the left foot shows no erythema, swelling, or tenderness    Assessment & Plan:  Gout  Plan is to continue the allopurinol once daily.  Decrease in GFR.  Plan recheck labs

## 2011-01-13 ENCOUNTER — Other Ambulatory Visit: Payer: Self-pay | Admitting: *Deleted

## 2011-01-13 MED ORDER — METFORMIN HCL 1000 MG PO TABS: 1000.0000 mg | ORAL_TABLET | Freq: Two times a day (BID) | ORAL | Status: DC

## 2011-01-13 MED ORDER — GLIPIZIDE 10 MG PO TABS: 10.0000 mg | ORAL_TABLET | Freq: Two times a day (BID) | ORAL | Status: DC

## 2011-01-23 ENCOUNTER — Other Ambulatory Visit (INDEPENDENT_AMBULATORY_CARE_PROVIDER_SITE_OTHER): Payer: Medicare Other | Admitting: Family Medicine

## 2011-01-23 DIAGNOSIS — E119 Type 2 diabetes mellitus without complications: Secondary | ICD-10-CM

## 2011-01-23 LAB — BASIC METABOLIC PANEL
Calcium: 9 mg/dL (ref 8.4–10.5)
Creatinine, Ser: 1.4 mg/dL — ABNORMAL HIGH (ref 0.4–1.2)
GFR: 39.32 mL/min — ABNORMAL LOW (ref >60.00)

## 2011-01-23 LAB — HEMOGLOBIN A1C: Hgb A1c MFr Bld: 6.7 % — ABNORMAL HIGH (ref 4.6–6.5)

## 2011-01-30 ENCOUNTER — Ambulatory Visit (INDEPENDENT_AMBULATORY_CARE_PROVIDER_SITE_OTHER): Payer: Medicare Other | Admitting: Family Medicine

## 2011-01-30 ENCOUNTER — Encounter: Payer: Self-pay | Admitting: Family Medicine

## 2011-01-30 VITALS — BP 124/80 | Temp 97.6°F | Wt 225.0 lb

## 2011-01-30 DIAGNOSIS — E119 Type 2 diabetes mellitus without complications: Secondary | ICD-10-CM

## 2011-01-30 NOTE — Progress Notes (Signed)
  Subjective:    Patient ID: Sherri Pratt, female    DOB: [birth year], 65 y.o.   MRN: [id number]  HPI Sherri Pratt is a 65 year old, married female, nonsmoker, who comes in today for follow-up of diabetes, type II.  Her fasting blood sugar was 127A1c 6.7%.  A1c in January was 6.5.  She's walking most days, but not every day.  She takes metformin 1000 mg b.i.d. And Glucotrol 10 mg b.i.d..  No hypoglycemia   Review of Systems    General and metabolic review of systems otherwise negative Objective:   Physical Exam    Well-developed well-nourished, female, in no acute distress    Assessment & Plan:  Diabetes type II,,,,,,,,,, continue current medications follow-up in September,,,,,,,,,, A1c,,,,,,,,,,,,,,, CPX January 2013  Walk daily

## 2011-01-30 NOTE — Patient Instructions (Signed)
Continue current medications.  Follow-up BMP, and A1c in September.  Annual physical first week in January 2013  Walk daily !!!!!!!!!!!!!

## 2011-05-16 ENCOUNTER — Other Ambulatory Visit: Payer: Self-pay | Admitting: Oncology

## 2011-05-16 ENCOUNTER — Encounter (HOSPITAL_BASED_OUTPATIENT_CLINIC_OR_DEPARTMENT_OTHER): Payer: Medicare Other | Admitting: Oncology

## 2011-05-16 DIAGNOSIS — N289 Disorder of kidney and ureter, unspecified: Secondary | ICD-10-CM

## 2011-05-16 DIAGNOSIS — D649 Anemia, unspecified: Secondary | ICD-10-CM

## 2011-05-16 DIAGNOSIS — D509 Iron deficiency anemia, unspecified: Secondary | ICD-10-CM

## 2011-05-16 DIAGNOSIS — D638 Anemia in other chronic diseases classified elsewhere: Secondary | ICD-10-CM

## 2011-05-16 LAB — CBC WITH DIFFERENTIAL/PLATELET
Basophils Absolute: 0 10*3/uL (ref 0.0–0.1)
EOS%: 4.6 % (ref 0.0–7.0)
HCT: 32.7 % — ABNORMAL LOW (ref 34.8–46.6)
HGB: 10.8 g/dL — ABNORMAL LOW (ref 11.6–15.9)
LYMPH%: 22.1 % (ref 14.0–49.7)
MCH: 27.6 pg (ref 25.1–34.0)
MCV: 83.2 fL (ref 79.5–101.0)
MONO%: 5.3 % (ref 0.0–14.0)
NEUT%: 67.5 % (ref 38.4–76.8)
Platelets: 188 10*3/uL (ref 145–400)
lymph#: 1.8 10*3/uL (ref 0.9–3.3)

## 2011-05-16 LAB — COMPREHENSIVE METABOLIC PANEL
AST: 22 U/L (ref 0–37)
BUN: 21 mg/dL (ref 6–23)
Calcium: 9.1 mg/dL (ref 8.4–10.5)
Chloride: 107 mEq/L (ref 96–112)
Creatinine, Ser: 1.5 mg/dL — ABNORMAL HIGH (ref 0.50–1.10)
Total Bilirubin: 0.3 mg/dL (ref 0.3–1.2)

## 2011-07-02 ENCOUNTER — Other Ambulatory Visit (INDEPENDENT_AMBULATORY_CARE_PROVIDER_SITE_OTHER): Payer: Medicare Other

## 2011-07-02 DIAGNOSIS — E119 Type 2 diabetes mellitus without complications: Secondary | ICD-10-CM

## 2011-07-02 LAB — BASIC METABOLIC PANEL
BUN: 16 mg/dL (ref 6–23)
CO2: 25 mEq/L (ref 19–32)
Chloride: 109 mEq/L (ref 96–112)
Creatinine, Ser: 1.4 mg/dL — ABNORMAL HIGH (ref 0.4–1.2)

## 2011-07-09 ENCOUNTER — Ambulatory Visit (INDEPENDENT_AMBULATORY_CARE_PROVIDER_SITE_OTHER): Payer: Medicare Other | Admitting: Family Medicine

## 2011-07-09 ENCOUNTER — Encounter: Payer: Self-pay | Admitting: Family Medicine

## 2011-07-09 VITALS — BP 124/84 | Temp 97.9°F | Wt 222.0 lb

## 2011-07-09 DIAGNOSIS — Z23 Encounter for immunization: Secondary | ICD-10-CM

## 2011-07-09 DIAGNOSIS — E119 Type 2 diabetes mellitus without complications: Secondary | ICD-10-CM

## 2011-07-09 DIAGNOSIS — L255 Unspecified contact dermatitis due to plants, except food: Secondary | ICD-10-CM

## 2011-07-09 NOTE — Progress Notes (Signed)
  Subjective:    Patient ID: Sherri Pratt, female    DOB: 01/16/1946, 65 y.o.   MRN: [id number]  Sherri Pratt is a 65 year old, married female, nonsmoker, who comes in today for follow-up of diabetes, and a new problem of a skin rash.  She is currently maxed out on her oral medicines, 10 mg, glipizide b.i.d., and metformin 1000 mg b.i.d..  Her fasting blood sugars 136A1c7.2.  She does not follow diet and she does not exercise on a regular basis.  We discussed adding insulin.  She is very reluctant to do that.  She has a rash on her left arm for 3 weeks.  It's red itchy.    Review of Systems    General metabolic and dermatologic review of systems otherwise negative Objective:   Physical Exam   Well-developed well-nourished, female, in no acute distress.  Examination of the skin shows rash, consistent with a contact her private     Assessment & Plan:  Diabetes type II not a goal plan walk 15 minutes daily.  Follow-up CPX in January.  Contact dermatitis.  Cortisone cream 3 times daily

## 2011-07-09 NOTE — Patient Instructions (Signed)
Continued all medications.  Walk 15 minutes daily.  Follow up in January for her annual exam.  Labs one week prior

## 2011-07-29 LAB — HM DIABETES EYE EXAM

## 2011-07-30 ENCOUNTER — Encounter: Payer: Self-pay | Admitting: Family Medicine

## 2011-10-27 ENCOUNTER — Other Ambulatory Visit (INDEPENDENT_AMBULATORY_CARE_PROVIDER_SITE_OTHER): Payer: Medicare Other

## 2011-10-27 DIAGNOSIS — E119 Type 2 diabetes mellitus without complications: Secondary | ICD-10-CM

## 2011-10-27 LAB — POCT URINALYSIS DIPSTICK
Blood, UA: NEGATIVE
Ketones, UA: NEGATIVE
Spec Grav, UA: 1.025
pH, UA: 5.5

## 2011-10-27 LAB — CBC WITH DIFFERENTIAL/PLATELET
Basophils Absolute: 0 10*3/uL (ref 0.0–0.1)
Eosinophils Absolute: 0.4 10*3/uL (ref 0.0–0.7)
Hemoglobin: 11.3 g/dL — ABNORMAL LOW (ref 12.0–15.0)
Lymphocytes Relative: 20.3 % (ref 12.0–46.0)
Lymphs Abs: 1.4 10*3/uL (ref 0.7–4.0)
MCHC: 33.2 g/dL (ref 30.0–36.0)
Neutro Abs: 4.7 10*3/uL (ref 1.4–7.7)
Platelets: 187 10*3/uL (ref 150.0–400.0)
RDW: 16 % — ABNORMAL HIGH (ref 11.5–14.6)

## 2011-10-27 LAB — BASIC METABOLIC PANEL
BUN: 15 mg/dL (ref 6–23)
CO2: 25 mEq/L (ref 19–32)
Calcium: 8.9 mg/dL (ref 8.4–10.5)
Glucose, Bld: 92 mg/dL (ref 70–99)
Sodium: 142 mEq/L (ref 135–145)

## 2011-10-27 LAB — LIPID PANEL
Cholesterol: 104 mg/dL (ref 0–200)
HDL: 43.3 mg/dL (ref >39.00)
VLDL: 31.6 mg/dL (ref 0.0–40.0)

## 2011-10-27 LAB — HEPATIC FUNCTION PANEL
Albumin: 3.8 g/dL (ref 3.5–5.2)
Alkaline Phosphatase: 72 U/L (ref 39–117)

## 2011-10-27 LAB — MICROALBUMIN / CREATININE URINE RATIO: Microalb Creat Ratio: 2.2 mg/g (ref 0.0–30.0)

## 2011-11-06 ENCOUNTER — Encounter: Payer: Self-pay | Admitting: Family Medicine

## 2011-11-06 ENCOUNTER — Ambulatory Visit (INDEPENDENT_AMBULATORY_CARE_PROVIDER_SITE_OTHER): Payer: Medicare Other | Admitting: Family Medicine

## 2011-11-06 DIAGNOSIS — B009 Herpesviral infection, unspecified: Secondary | ICD-10-CM

## 2011-11-06 DIAGNOSIS — Z0000 Encounter for general adult medical examination without abnormal findings: Secondary | ICD-10-CM

## 2011-11-06 DIAGNOSIS — M109 Gout, unspecified: Secondary | ICD-10-CM

## 2011-11-06 DIAGNOSIS — I10 Essential (primary) hypertension: Secondary | ICD-10-CM

## 2011-11-06 DIAGNOSIS — E669 Obesity, unspecified: Secondary | ICD-10-CM

## 2011-11-06 DIAGNOSIS — N189 Chronic kidney disease, unspecified: Secondary | ICD-10-CM

## 2011-11-06 DIAGNOSIS — E119 Type 2 diabetes mellitus without complications: Secondary | ICD-10-CM

## 2011-11-06 DIAGNOSIS — E785 Hyperlipidemia, unspecified: Secondary | ICD-10-CM

## 2011-11-06 MED ORDER — GLIPIZIDE 10 MG PO TABS: 10.0000 mg | ORAL_TABLET | Freq: Two times a day (BID) | ORAL | Status: DC

## 2011-11-06 MED ORDER — VALACYCLOVIR HCL 1 G PO TABS: 1000.0000 mg | ORAL_TABLET | Freq: Two times a day (BID) | ORAL | Status: DC | PRN

## 2011-11-06 MED ORDER — LISINOPRIL-HYDROCHLOROTHIAZIDE 20-25 MG PO TABS: 1.0000 | ORAL_TABLET | Freq: Every day | ORAL | Status: DC

## 2011-11-06 MED ORDER — ALLOPURINOL 300 MG PO TABS: 300.0000 mg | ORAL_TABLET | Freq: Every day | ORAL | Status: DC

## 2011-11-06 MED ORDER — SIMVASTATIN 20 MG PO TABS: 20.0000 mg | ORAL_TABLET | Freq: Every day | ORAL | Status: DC

## 2011-11-06 MED ORDER — METFORMIN HCL 1000 MG PO TABS: 1000.0000 mg | ORAL_TABLET | Freq: Two times a day (BID) | ORAL | Status: DC

## 2011-11-06 NOTE — Progress Notes (Signed)
  Subjective:    Patient ID: Sherri Pratt, female    DOB: 1945/11/16, 66 y.o.   MRN: [id number]  HPI Sherri Pratt is a 66 year old married female nonsmoker who comes in today for a Medicare wellness examination because of a history of diabetes, hypertension, hyperuricemia, hyperlipidemia, obesity,  She takes glipizide 10 mg b.i.d. And metformin 1000 mg b.i.d. Fasting blood sugar 121 yesterday A1c is 6.8%  She takes it at his side 20 to 25 daily for hypertension and initial BP was 180/90.  She states she always gets nervous when she comes in here and her blood pressure goes up and her blood pressure at home was normal.  Asked her to check her daily for 3 weeks and fax me the data  She takes Zocor 20 mg nightly and an aspirin tablet for hyperlipidemia lipids are dull with an LDL of 29  Her weight is 212 pounds unchanged.  She does not diet or exercise.  She has an occasional bout of HSV and she takes Valtrex 1000 mg b.i.d. P.r.n.  She gets routine eye care, hearing normal, regular dental care, colonoscopy normal, pelvic and Pap last year normal therefore it re-three years, flu shot 2012, Pneumovax x 2, tetanus 2009, information given on shingles  Cognitive function normal she does not exercise on a regular basis.  Home health safety reviewed no issues identified, no guns in the house, she does have a health care power-of-attorney and a living well.   Review of Systems  Constitutional: Negative.   HENT: Negative.   Eyes: Negative.   Respiratory: Negative.   Cardiovascular: Negative.   Gastrointestinal: Negative.   Genitourinary: Negative.   Musculoskeletal: Negative.   Neurological: Negative.   Hematological: Negative.   Psychiatric/Behavioral: Negative.        Objective:   Physical Exam  Constitutional: She appears well-developed and well-nourished.  HENT:  Head: Normocephalic and atraumatic.  Right Ear: External ear normal.  Left Ear: External ear normal.  Nose: Nose normal.    Mouth/Throat: Oropharynx is clear and moist.  Eyes: EOM are normal. Pupils are equal, round, and reactive to light.  Neck: Normal range of motion. Neck supple. No thyromegaly present.  Cardiovascular: Normal rate, regular rhythm, normal heart sounds and intact distal pulses.  Exam reveals no gallop and no friction rub.   No murmur heard. Pulmonary/Chest: Effort normal and breath sounds normal.  Abdominal: Soft. Bowel sounds are normal. She exhibits no distension and no mass. There is no tenderness. There is no rebound.  Genitourinary:       Bilateral breast exam normal  Musculoskeletal: Normal range of motion.  Lymphadenopathy:    She has no cervical adenopathy.  Neurological: She is alert. She has normal reflexes. No cranial nerve deficit. She exhibits normal muscle tone. Coordination normal.  Skin: Skin is warm and dry.  Psychiatric: She has a normal mood and affect. Her behavior is normal. Judgment and thought content normal.          Assessment & Plan:  Healthy female.  Diabetes type II adult continue current therapy begin a walking exercise program daily.  Hypertension.  Question gall plan BP check at home daily for 3 weeks and fax data.  Hyperlipidemia, goal continue current medication.  History of hyperuricemia continue no apparent 300 mg daily.  Obesity.  Again encouraged diet exercise and weight loss.  History of HSV.  Continue Valtrex p.r.n.  Follow up A1c in 6 months in stable

## 2011-11-06 NOTE — Patient Instructions (Signed)
Continue your current medications.  Begin a walking program 20 minutes daily.  Check a blood pressure in the morning everyday for 3 weeks and fax me the day that at [phone].  Return in 6 months for follow-up, sooner if any problems.  Call today and get set up for your mammogram

## 2012-02-02 ENCOUNTER — Other Ambulatory Visit: Payer: Self-pay | Admitting: *Deleted

## 2012-02-02 DIAGNOSIS — E119 Type 2 diabetes mellitus without complications: Secondary | ICD-10-CM

## 2012-02-02 MED ORDER — GLIPIZIDE 10 MG PO TABS: 10.0000 mg | ORAL_TABLET | Freq: Two times a day (BID) | ORAL | Status: DC

## 2012-04-12 ENCOUNTER — Telehealth: Payer: Self-pay | Admitting: Oncology

## 2012-04-12 NOTE — Telephone Encounter (Signed)
called pt and she is aware of her appt  aom

## 2012-04-27 ENCOUNTER — Other Ambulatory Visit: Payer: Medicare Other

## 2012-05-04 ENCOUNTER — Other Ambulatory Visit (INDEPENDENT_AMBULATORY_CARE_PROVIDER_SITE_OTHER): Payer: Medicare Other

## 2012-05-04 ENCOUNTER — Ambulatory Visit: Payer: Medicare Other | Admitting: Family Medicine

## 2012-05-04 DIAGNOSIS — E119 Type 2 diabetes mellitus without complications: Secondary | ICD-10-CM

## 2012-05-04 LAB — BASIC METABOLIC PANEL
CO2: 25 mEq/L (ref 19–32)
Chloride: 105 mEq/L (ref 96–112)
Potassium: 4.1 mEq/L (ref 3.5–5.1)

## 2012-05-04 LAB — HEMOGLOBIN A1C: Hgb A1c MFr Bld: 7.4 % — ABNORMAL HIGH (ref 4.6–6.5)

## 2012-05-10 ENCOUNTER — Encounter: Payer: Self-pay | Admitting: Family Medicine

## 2012-05-10 ENCOUNTER — Ambulatory Visit (INDEPENDENT_AMBULATORY_CARE_PROVIDER_SITE_OTHER): Payer: Medicare Other | Admitting: Family Medicine

## 2012-05-10 VITALS — BP 120/80 | Temp 98.2°F | Wt 215.0 lb

## 2012-05-10 DIAGNOSIS — E119 Type 2 diabetes mellitus without complications: Secondary | ICD-10-CM

## 2012-05-10 MED ORDER — "INSULIN SYRINGE 29G X 1/2"" 0.5 ML MISC": 1.0000 | Freq: Every day | Status: DC

## 2012-05-10 MED ORDER — INSULIN LISPRO PROT & LISPRO (75-25 MIX) 100 UNIT/ML ~~LOC~~ SUSP: 10.0000 [IU] | Freq: Every day | SUBCUTANEOUS | Status: DC

## 2012-05-10 NOTE — Progress Notes (Signed)
  Subjective:    Patient ID: Sherri Pratt, female    DOB: 1945/12/21, 66 y.o.   MRN: [id number]  HPI Sherri Pratt is a 66 year old married female nonsmoker who comes in today for followup of diabetes type 2  She's currently on max dose of Glucotrol and metformin however blood sugars going up A1c going up. We'll therefore start her on small doses of insulin. Weight is steady at 2:15. She finds it difficult to diet and exercise   Review of Systems    general and metabolic review of systems otherwise negative Objective:   Physical Exam Obese female no acute distress       Assessment & Plan:  Diabetes type 1 ,,,,,,,,, started insulin 7525 doses 10 units nightly followup A1c in 2 months

## 2012-05-10 NOTE — Patient Instructions (Signed)
Begin insulin and take 10 units nightly at bedtime  Check a fasting blood sugar daily in the morning  Walk 20 minutes daily  Followup in 2 months labs one week prior

## 2012-05-26 ENCOUNTER — Other Ambulatory Visit: Payer: Self-pay | Admitting: Oncology

## 2012-05-26 ENCOUNTER — Ambulatory Visit (HOSPITAL_BASED_OUTPATIENT_CLINIC_OR_DEPARTMENT_OTHER): Payer: Medicare Other | Admitting: Oncology

## 2012-05-26 ENCOUNTER — Other Ambulatory Visit (HOSPITAL_BASED_OUTPATIENT_CLINIC_OR_DEPARTMENT_OTHER): Payer: Medicare Other | Admitting: Lab

## 2012-05-26 VITALS — BP 164/75 | HR 66 | Temp 97.2°F | Resp 20 | Ht 64.5 in | Wt 219.6 lb

## 2012-05-26 DIAGNOSIS — D649 Anemia, unspecified: Secondary | ICD-10-CM

## 2012-05-26 DIAGNOSIS — E119 Type 2 diabetes mellitus without complications: Secondary | ICD-10-CM

## 2012-05-26 DIAGNOSIS — N289 Disorder of kidney and ureter, unspecified: Secondary | ICD-10-CM

## 2012-05-26 DIAGNOSIS — I10 Essential (primary) hypertension: Secondary | ICD-10-CM

## 2012-05-26 DIAGNOSIS — N189 Chronic kidney disease, unspecified: Secondary | ICD-10-CM

## 2012-05-26 LAB — CBC WITH DIFFERENTIAL/PLATELET
Basophils Absolute: 0 10*3/uL (ref 0.0–0.1)
Eosinophils Absolute: 0.3 10*3/uL (ref 0.0–0.5)
HGB: 11.5 g/dL — ABNORMAL LOW (ref 11.6–15.9)
MCV: 83.5 fL (ref 79.5–101.0)
MONO%: 5.7 % (ref 0.0–14.0)
NEUT#: 5.1 10*3/uL (ref 1.5–6.5)
Platelets: 180 10*3/uL (ref 145–400)
RDW: 15.9 % — ABNORMAL HIGH (ref 11.2–14.5)

## 2012-05-26 LAB — IRON AND TIBC
TIBC: 357 ug/dL (ref 250–470)
UIBC: 313 ug/dL (ref 125–400)

## 2012-05-26 LAB — FERRITIN: Ferritin: 40 ng/mL (ref 10–291)

## 2012-05-26 NOTE — Progress Notes (Signed)
Hematology and Oncology Follow Up Visit  Sherri Pratt [id number] 03-18-1946 66 y.o. 05/26/2012 10:11 AM    Principle Diagnosis: A 66 year old female with multifactorial anemia.  She has anemia of renal disease, anemia of chronic disease and element of iron deficiency.  Prior Therapy: Status post iron replacement 500 mg IV back in 2008.  SECONDARY DIAGNOSES:  Including diabetes mellitus, chronic renal sufficiency and hypothyroidism.  Interim History: Sherri Pratt presents today for followup visit. A very pleasant, 66 year old female with few comorbid conditions outlined above.  Since the last time I saw her she had not really reported any major changes in her health or her performance status.  She had not had any hospitalization.  Had not had any illnesses.  Did not report any hematochezia or melena.  Overall performance status and activity level is pretty much unchanged at this point.  She did not report any hematochezia.  She does report that she had a colonoscopy although she is not quite recall when that was. She was recently started on insulin and have been doing well with it.   Medications: I have reviewed the patient's current medications. Current outpatient prescriptions:allopurinol (ZYLOPRIM) 300 MG tablet, Take 1 tablet (300 mg total) by mouth daily., Disp: 100 tablet, Rfl: 3;  glipiZIDE (GLUCOTROL) 10 MG tablet, Take 1 tablet (10 mg total) by mouth 2 (two) times daily before a meal., Disp: 200 tablet, Rfl: 3;  insulin lispro protamine-insulin lispro (HUMALOG 75/25) (75-25) 100 UNIT/ML SUSP, Inject 10 Units into the skin daily with breakfast., Disp: 10 mL, Rfl: 12 INSULIN SYRINGE .5CC/29G 29G X 1/2" 0.5 ML MISC, 1 each by Does not apply route daily., Disp: 100 each, Rfl: 3;  lisinopril-hydrochlorothiazide (PRINZIDE,ZESTORETIC) 20-25 MG per tablet, Take 1 tablet by mouth daily., Disp: 100 tablet, Rfl: 3;  metFORMIN (GLUCOPHAGE) 1000 MG tablet, Take 1 tablet (1,000 mg total) by mouth 2 (two)  times daily with a meal., Disp: 200 tablet, Rfl: 3 simvastatin (ZOCOR) 20 MG tablet, Take 1 tablet (20 mg total) by mouth at bedtime., Disp: 100 tablet, Rfl: 3  Allergies: Not on File  Past Medical History, Surgical history, Social history, and Family History were reviewed and updated.  Review of Systems: Constitutional:  Negative for fever, chills, night sweats, anorexia, weight loss, pain. Cardiovascular: no chest pain or dyspnea on exertion Respiratory: negative Neurological: negative Dermatological: negative ENT: negative Skin: Negative. Gastrointestinal: negative Genito-Urinary: negative Hematological and Lymphatic: negative Breast: negative Musculoskeletal: negative Remaining ROS negative. Physical Exam: Blood pressure 164/75, pulse 66, temperature 97.2 F (36.2 C), temperature source Oral, resp. rate 20, height 5' 4.5" (1.638 m), weight 219 lb 9.6 oz (99.61 kg). ECOG: 1 General appearance: alert Head: Normocephalic, without obvious abnormality, atraumatic Neck: no adenopathy, no carotid bruit, no JVD, supple, symmetrical, trachea midline and thyroid not enlarged, symmetric, no tenderness/mass/nodules Lymph nodes: Cervical, supraclavicular, and axillary nodes normal. Heart:regular rate and rhythm, S1, S2 normal, no murmur, click, rub or gallop Lung:chest clear, no wheezing, rales, normal symmetric air entry Abdomin: soft, non-tender, without masses or organomegaly EXT:no erythema, induration, or nodules   Lab Results: Lab Results  Component Value Date   WBC 7.6 05/26/2012   HGB 11.5* 05/26/2012   HCT 34.9 05/26/2012   MCV 83.5 05/26/2012   PLT 180 05/26/2012     Chemistry      Component Value Date/Time   NA 138 05/04/2012 0852   K 4.1 05/04/2012 0852   CL 105 05/04/2012 0852   CO2 25 05/04/2012 4098  BUN 20 05/04/2012 0852   CREATININE 1.2 05/04/2012 0852      Component Value Date/Time   CALCIUM 9.5 05/04/2012 0852   ALKPHOS 72 10/27/2011 0805   AST 22 10/27/2011  0805   ALT 22 10/27/2011 0805   BILITOT 0.4 10/27/2011 0805      Impression and Plan:  This is a pleasant, 66 year old female with the following issues:  1. Mild anemia, multifactorial in nature.  Again her erythropoietin levels were suboptimal prior and are fairly adequate at this point.  Overall, her hemoglobin is almost normal. She is asymptomatic.  I do not think there is really need to initiate growth factor support such as Aranesp or Procrit to boost her production.  For now, I will be happy to see Sherri Pratt as needed and we can consider retreating her with iron or initiating growth factor support in the future if her counts drop. For now, she does not really need any intervention.   2. Hypertension, slightly elevated again, monitored by Dr. Tawanna Cooler.  [facility], MD 8/14/201310:11 AM

## 2012-07-12 ENCOUNTER — Other Ambulatory Visit (INDEPENDENT_AMBULATORY_CARE_PROVIDER_SITE_OTHER): Payer: Medicare Other

## 2012-07-12 DIAGNOSIS — E119 Type 2 diabetes mellitus without complications: Secondary | ICD-10-CM

## 2012-07-12 LAB — BASIC METABOLIC PANEL
CO2: 24 mEq/L (ref 19–32)
Calcium: 9.1 mg/dL (ref 8.4–10.5)
GFR: 44.13 mL/min — ABNORMAL LOW (ref >60.00)
Sodium: 138 mEq/L (ref 135–145)

## 2012-07-12 LAB — HEMOGLOBIN A1C: Hgb A1c MFr Bld: 7.2 % — ABNORMAL HIGH (ref 4.6–6.5)

## 2012-07-19 ENCOUNTER — Ambulatory Visit (INDEPENDENT_AMBULATORY_CARE_PROVIDER_SITE_OTHER): Payer: Medicare Other | Admitting: Family Medicine

## 2012-07-19 ENCOUNTER — Encounter: Payer: Self-pay | Admitting: Family Medicine

## 2012-07-19 DIAGNOSIS — N189 Chronic kidney disease, unspecified: Secondary | ICD-10-CM

## 2012-07-19 DIAGNOSIS — E119 Type 2 diabetes mellitus without complications: Secondary | ICD-10-CM

## 2012-07-19 MED ORDER — LISINOPRIL 40 MG PO TABS: 40.0000 mg | ORAL_TABLET | Freq: Every day | ORAL | Status: DC

## 2012-07-19 NOTE — Progress Notes (Signed)
  Subjective:    Patient ID: Sherri Pratt, female    DOB: [birth year], 66 y.o.   MRN: [id number]  HPI Sherri Pratt is a 66 year old married female nonsmoker who comes in today for followup of diabetes and renal insufficiency  She's done a much better job controlling her diabetes her blood sugar was 177 2 months ago now start to 113. Her A1c has gone from 7.4-7.2%. However her creatinine went up from 1.2-1.3 and her GFR Strout from 50 down to 44   Review of Systems    general and kidney review of systems otherwise negative Objective:   Physical Exam Well-developed overweight female no acute distress       Assessment & Plan:  Diabetes type 2 approach and goal continue current therapy  Hypertension and renal insufficiency

## 2012-07-19 NOTE — Patient Instructions (Signed)
Stop the Zestoretic  Begin plain lisinopril 40 mg one tablet daily in the morning  BP checked daily in the morning followup in 4 weeks  Drink 24 ounces of water daily

## 2012-07-29 LAB — HM DIABETES EYE EXAM: HM Diabetic Eye Exam: DETECTED

## 2012-08-02 ENCOUNTER — Encounter: Payer: Self-pay | Admitting: Family Medicine

## 2012-08-23 ENCOUNTER — Encounter: Payer: Self-pay | Admitting: Family Medicine

## 2012-08-23 ENCOUNTER — Ambulatory Visit (INDEPENDENT_AMBULATORY_CARE_PROVIDER_SITE_OTHER): Payer: Medicare Other | Admitting: Family Medicine

## 2012-08-23 VITALS — BP 150/84 | Temp 98.0°F | Wt 222.0 lb

## 2012-08-23 DIAGNOSIS — E119 Type 2 diabetes mellitus without complications: Secondary | ICD-10-CM

## 2012-08-23 DIAGNOSIS — I10 Essential (primary) hypertension: Secondary | ICD-10-CM

## 2012-08-23 NOTE — Patient Instructions (Addendum)
Continue your current treatment for your diabetes  Remember to walk daily  24 ounces of water daily  Increase the lisinopril to 60 mg daily  Check your blood pressure right arm sitting position daily in the morning  Return in 4 weeks for followup

## 2012-08-23 NOTE — Progress Notes (Signed)
  Subjective:    Patient ID: [name], female    DOB: 10/18/1945, 66 y.o.   MRN: [id number]  HPI Janina is a 66 year old married female nonsmoker who comes in today for followup of diabetes and hypertension  She's currently on lisinopril 40 mg daily BP right arm sitting position by me 170/80  Her digital machine is reading about 10 points too low on the systolic.  Her blood sugars are in the 120 range on Glucotrol 10 mg twice a day, 10 units of 7525 insulin daily along with metformin 1000 mg twice a day.   Review of Systems Gen. cardiovascular metabolic review of systems otherwise negative    Objective:   Physical Exam Well-developed well-nourished slightly overweight female no acute distress BP right arm sitting position is 170/70  Blood sugar at goal         Assessment & Plan:  Diabetes type 1 check labs  Hypertension not at goal increase lisinopril to 60 mg daily followup in 4 weeks

## 2012-09-20 ENCOUNTER — Ambulatory Visit (INDEPENDENT_AMBULATORY_CARE_PROVIDER_SITE_OTHER): Payer: Medicare Other | Admitting: Family Medicine

## 2012-09-20 ENCOUNTER — Encounter: Payer: Self-pay | Admitting: Family Medicine

## 2012-09-20 VITALS — BP 120/72 | Temp 97.9°F | Wt 223.0 lb

## 2012-09-20 DIAGNOSIS — E119 Type 2 diabetes mellitus without complications: Secondary | ICD-10-CM

## 2012-09-20 DIAGNOSIS — I10 Essential (primary) hypertension: Secondary | ICD-10-CM

## 2012-09-20 LAB — BASIC METABOLIC PANEL
Calcium: 9.5 mg/dL (ref 8.4–10.5)
GFR: 47.06 mL/min — ABNORMAL LOW (ref >60.00)
Sodium: 138 mEq/L (ref 135–145)

## 2012-09-20 MED ORDER — LISINOPRIL 40 MG PO TABS: ORAL_TABLET | ORAL | Status: DC

## 2012-09-20 NOTE — Patient Instructions (Addendum)
Continue your current medications  I would check a fasting blood sugar and blood pressure weekly  Followup in January

## 2012-09-20 NOTE — Progress Notes (Signed)
  Subjective:    Patient ID: Sherri Pratt, female    DOB: 02-18-1946, 66 y.o.   MRN: [id number]  HPI Sherri Pratt is a 66 year old married female nonsmoker who comes in today for evaluation of hypertension  We increased her lisinopril from 40 mg daily the 60 because her blood pressure was elevated. BP now 120/72  Fasting blood sugars daily in the 110 range   Review of Systems    review of systems negative Objective:   Physical Exam  Well-developed well-nourished female in acute distress BP right arm sitting position 120/72      Assessment & Plan:  Hypertension adult continue current therapy  Diabetes type 1  at goal continue current therapy

## 2012-11-01 ENCOUNTER — Other Ambulatory Visit (INDEPENDENT_AMBULATORY_CARE_PROVIDER_SITE_OTHER): Payer: Medicare Other

## 2012-11-01 DIAGNOSIS — I10 Essential (primary) hypertension: Secondary | ICD-10-CM

## 2012-11-01 DIAGNOSIS — E119 Type 2 diabetes mellitus without complications: Secondary | ICD-10-CM

## 2012-11-01 LAB — BASIC METABOLIC PANEL
BUN: 16 mg/dL (ref 6–23)
CO2: 25 mEq/L (ref 19–32)
Chloride: 107 mEq/L (ref 96–112)
GFR: 46.16 mL/min — ABNORMAL LOW (ref >60.00)
Glucose, Bld: 117 mg/dL — ABNORMAL HIGH (ref 70–99)
Potassium: 4.7 mEq/L (ref 3.5–5.1)

## 2012-11-01 LAB — POCT URINALYSIS DIPSTICK
Blood, UA: NEGATIVE
Ketones, UA: NEGATIVE
Leukocytes, UA: NEGATIVE
pH, UA: 5

## 2012-11-01 LAB — LIPID PANEL
HDL: 41.8 mg/dL (ref >39.00)
LDL Cholesterol: 56 mg/dL (ref 0–99)
Total CHOL/HDL Ratio: 3
Triglycerides: 145 mg/dL (ref 0.0–149.0)
VLDL: 29 mg/dL (ref 0.0–40.0)

## 2012-11-01 LAB — CBC WITH DIFFERENTIAL/PLATELET
Basophils Relative: 0.5 % (ref 0.0–3.0)
Eosinophils Relative: 5 % (ref 0.0–5.0)
HCT: 33.2 % — ABNORMAL LOW (ref 36.0–46.0)
Hemoglobin: 11 g/dL — ABNORMAL LOW (ref 12.0–15.0)
Lymphs Abs: 1.7 10*3/uL (ref 0.7–4.0)
MCV: 84.9 fl (ref 78.0–100.0)
Monocytes Absolute: 0.4 10*3/uL (ref 0.1–1.0)
Monocytes Relative: 5.8 % (ref 3.0–12.0)
Neutro Abs: 5.2 10*3/uL (ref 1.4–7.7)
Platelets: 181 10*3/uL (ref 150.0–400.0)
RBC: 3.91 Mil/uL (ref 3.87–5.11)
WBC: 7.7 10*3/uL (ref 4.5–10.5)

## 2012-11-01 LAB — HEPATIC FUNCTION PANEL
AST: 24 U/L (ref 0–37)
Albumin: 3.9 g/dL (ref 3.5–5.2)
Total Bilirubin: 0.5 mg/dL (ref 0.3–1.2)

## 2012-11-08 ENCOUNTER — Encounter: Payer: Self-pay | Admitting: Family Medicine

## 2012-11-08 ENCOUNTER — Ambulatory Visit (INDEPENDENT_AMBULATORY_CARE_PROVIDER_SITE_OTHER): Payer: Medicare Other | Admitting: Family Medicine

## 2012-11-08 VITALS — BP 130/70 | Temp 98.3°F | Ht 65.5 in | Wt 222.0 lb

## 2012-11-08 DIAGNOSIS — D649 Anemia, unspecified: Secondary | ICD-10-CM | POA: Insufficient documentation

## 2012-11-08 DIAGNOSIS — E119 Type 2 diabetes mellitus without complications: Secondary | ICD-10-CM

## 2012-11-08 DIAGNOSIS — M109 Gout, unspecified: Secondary | ICD-10-CM

## 2012-11-08 DIAGNOSIS — Z0000 Encounter for general adult medical examination without abnormal findings: Secondary | ICD-10-CM

## 2012-11-08 DIAGNOSIS — I10 Essential (primary) hypertension: Secondary | ICD-10-CM

## 2012-11-08 DIAGNOSIS — N189 Chronic kidney disease, unspecified: Secondary | ICD-10-CM

## 2012-11-08 DIAGNOSIS — E785 Hyperlipidemia, unspecified: Secondary | ICD-10-CM

## 2012-11-08 DIAGNOSIS — B369 Superficial mycosis, unspecified: Secondary | ICD-10-CM | POA: Insufficient documentation

## 2012-11-08 LAB — HM DIABETES FOOT EXAM: HM Diabetic Foot Exam: NORMAL

## 2012-11-08 MED ORDER — INSULIN LISPRO PROT & LISPRO (75-25 MIX) 100 UNIT/ML ~~LOC~~ SUSP: 10.0000 [IU] | Freq: Every day | SUBCUTANEOUS | Status: DC

## 2012-11-08 MED ORDER — GLIPIZIDE 10 MG PO TABS: 10.0000 mg | ORAL_TABLET | Freq: Two times a day (BID) | ORAL | Status: DC

## 2012-11-08 MED ORDER — SIMVASTATIN 20 MG PO TABS: 20.0000 mg | ORAL_TABLET | Freq: Every day | ORAL | Status: DC

## 2012-11-08 MED ORDER — ALLOPURINOL 300 MG PO TABS: 300.0000 mg | ORAL_TABLET | Freq: Every day | ORAL | Status: DC

## 2012-11-08 MED ORDER — METFORMIN HCL 1000 MG PO TABS: 1000.0000 mg | ORAL_TABLET | Freq: Two times a day (BID) | ORAL | Status: DC

## 2012-11-08 MED ORDER — "INSULIN SYRINGE 29G X 1/2"" 0.5 ML MISC": 1.0000 | Freq: Every day | Status: DC

## 2012-11-08 NOTE — Patient Instructions (Addendum)
Continue your current medications  Remember to walk daily  Work hard this year on a diet and weight loss  Followup in 6 months  Nonfasting labs one week prior  Stop all aspirin and aspirin products

## 2012-11-08 NOTE — Progress Notes (Signed)
  Subjective:    Patient ID: Sherri Pratt, female    DOB: June 29, 1946, 67 y.o.   MRN: [id number]  HPI [place] is a 67 year old married female nonsmoker who comes in today for a Medicare wellness examination because of a history of gout, diabetes type 1, hyperlipidemia, hypertension, obesity, and a fungal infection in her left groin area  Her medications reviewed in detail and there've been no changes. 6 her fasting blood sugar at home in the morning weekly is around 95.  She gets routine eye care, dental,,,,,,,,,,, due to have her teeth removed by Dr. Manson Passey oral surgeon,,,,,,, BSE monthly and you mammography and colonoscopy and GI. Vaccinations up-to-date flu shot 2013.  Cognitive function normal she does not exercise on a regular basis home health safety reviewed no issues identified, no guns in the house, she does have a health care power of attorney and living well   Review of Systems  Constitutional: Negative.   HENT: Negative.   Eyes: Negative.   Respiratory: Negative.   Cardiovascular: Negative.   Gastrointestinal: Negative.   Genitourinary: Negative.   Musculoskeletal: Negative.   Neurological: Negative.   Hematological: Negative.   Psychiatric/Behavioral: Negative.        Objective:   Physical Exam  Constitutional: She is oriented to person, place, and time. She appears well-developed and well-nourished.  HENT:  Head: Normocephalic and atraumatic.  Right Ear: External ear normal.  Left Ear: External ear normal.  Nose: Nose normal.  Mouth/Throat: Oropharynx is clear and moist.  Eyes: EOM are normal. Pupils are equal, round, and reactive to light.  Neck: Normal range of motion. Neck supple. No thyromegaly present.  Cardiovascular: Normal rate, regular rhythm, normal heart sounds and intact distal pulses.  Exam reveals no gallop and no friction rub.   No murmur heard.      No carotid bruits, aorta normal, peripheral pulses normal  Pulmonary/Chest: Effort normal and breath  sounds normal.  Abdominal: Soft. Bowel sounds are normal. She exhibits no distension and no mass. There is no tenderness. There is no rebound.  Genitourinary:       Bilateral breast exam normal  Pelvic and rectal deferred no longer indicated  Musculoskeletal: Normal range of motion.  Lymphadenopathy:    She has no cervical adenopathy.  Neurological: She is alert and oriented to person, place, and time. She has normal reflexes. No cranial nerve deficit. She exhibits normal muscle tone. Coordination normal.       No neuropathy  Skin: Skin is warm and dry.       Total body skin exam normal except for fungal infection left groin  Psychiatric: She has a normal mood and affect. Her behavior is normal. Judgment and thought content normal.          Assessment & Plan:  Healthy female  History of gout continue allopurinol  Diabetes type 2 continue current medication  Hypertension continue current medication  Hyperlipidemia continue current medication  Obesity weight 222 pounds again discussed diet exercise and weight loss  Fungal infection left groin OTC antifungal cream twice daily  Return in 6 months for followup on her diabetes

## 2013-01-27 ENCOUNTER — Other Ambulatory Visit: Payer: Self-pay | Admitting: Family Medicine

## 2013-05-02 ENCOUNTER — Other Ambulatory Visit (INDEPENDENT_AMBULATORY_CARE_PROVIDER_SITE_OTHER): Payer: Medicare Other

## 2013-05-02 DIAGNOSIS — E119 Type 2 diabetes mellitus without complications: Secondary | ICD-10-CM

## 2013-05-02 DIAGNOSIS — D649 Anemia, unspecified: Secondary | ICD-10-CM

## 2013-05-02 LAB — CBC WITH DIFFERENTIAL/PLATELET
Eosinophils Absolute: 0.5 10*3/uL (ref 0.0–0.7)
HCT: 32.2 % — ABNORMAL LOW (ref 36.0–46.0)
Lymphs Abs: 2.1 10*3/uL (ref 0.7–4.0)
MCHC: 33.1 g/dL (ref 30.0–36.0)
MCV: 86.8 fl (ref 78.0–100.0)
Monocytes Absolute: 0.6 10*3/uL (ref 0.1–1.0)
Neutrophils Relative %: 67.2 % (ref 43.0–77.0)
Platelets: 201 10*3/uL (ref 150.0–400.0)

## 2013-05-02 LAB — BASIC METABOLIC PANEL
BUN: 24 mg/dL — ABNORMAL HIGH (ref 6–23)
CO2: 23 mEq/L (ref 19–32)
Chloride: 112 mEq/L (ref 96–112)
Creatinine, Ser: 1.2 mg/dL (ref 0.4–1.2)
Glucose, Bld: 121 mg/dL — ABNORMAL HIGH (ref 70–99)

## 2013-05-02 LAB — FERRITIN: Ferritin: 40.5 ng/mL (ref 10.0–291.0)

## 2013-05-02 LAB — VITAMIN B12: Vitamin B-12: 182 pg/mL — ABNORMAL LOW (ref 211–911)

## 2013-05-09 ENCOUNTER — Ambulatory Visit: Payer: Medicare Other | Admitting: Family Medicine

## 2013-07-14 ENCOUNTER — Encounter: Payer: Self-pay | Admitting: Family Medicine

## 2013-07-14 ENCOUNTER — Ambulatory Visit (INDEPENDENT_AMBULATORY_CARE_PROVIDER_SITE_OTHER): Payer: Medicare Other | Admitting: Family Medicine

## 2013-07-14 VITALS — BP 120/80 | Temp 98.6°F | Wt 216.0 lb

## 2013-07-14 DIAGNOSIS — I10 Essential (primary) hypertension: Secondary | ICD-10-CM

## 2013-07-14 DIAGNOSIS — D649 Anemia, unspecified: Secondary | ICD-10-CM

## 2013-07-14 DIAGNOSIS — E119 Type 2 diabetes mellitus without complications: Secondary | ICD-10-CM

## 2013-07-14 DIAGNOSIS — D510 Vitamin B12 deficiency anemia due to intrinsic factor deficiency: Secondary | ICD-10-CM

## 2013-07-14 DIAGNOSIS — Z23 Encounter for immunization: Secondary | ICD-10-CM

## 2013-07-14 MED ORDER — GLUCOSE BLOOD VI STRP: 1.0000 | ORAL_STRIP | Freq: Every day | Status: DC

## 2013-07-14 MED ORDER — CYANOCOBALAMIN 1000 MCG/ML IJ SOLN: INTRAMUSCULAR | Status: DC

## 2013-07-14 MED ORDER — "INSULIN SYRINGE 29G X 1/2"" 1 ML MISC": 1.0000 | Status: DC

## 2013-07-14 NOTE — Patient Instructions (Addendum)
Vitamin B12........[name] 1 cc weekly for 2 months then 1 cc monthly forever  Continue your diet and exercise and diabetes medicine as outlined  Return in 6 months for followup sooner if any problems  Return in 3 months for followup on the pernicious anemia

## 2013-07-14 NOTE — Progress Notes (Signed)
  Subjective:    Patient ID: Sherri Pratt, female    DOB: 13-Dec-1945, 67 y.o.   MRN: [id number]  HPI Sherri Pratt is a 67 year old married female nonsmoker who comes in today for evaluation of 2 problems  She has underlying diabetes and is currently on insulin 10 units with breakfast long-acting 70-25, metformin 1000 mg twice a day, glipizide 10 mg twice a day. Blood sugar is in the 110 range at home A1c has dropped from 7.2 7.0%. She's lost 6 pounds in the last 6 months by walking every day and pay more attention to her diet.  Her hemoglobin has dropped to 10.7. Ferritin level normal at 40.5 B12 level low at 182.   Review of Systems    review of systems otherwise negative Objective:   Physical Exam Well-developed well-nourished female no acute distress vital signs stable she is afebrile       Assessment & Plan:  Diabetes type 1 under improved control with improved diet and daily exercise now A1c normal continue above therapy followup in 6 months  Pernicious anemia begin B12......... I gave her 1 cc of B12 left upper thigh and instructed her how to do it at home

## 2013-09-13 ENCOUNTER — Other Ambulatory Visit: Payer: Self-pay | Admitting: *Deleted

## 2013-09-13 MED ORDER — LISINOPRIL 40 MG PO TABS: ORAL_TABLET | ORAL | Status: DC

## 2013-10-30 ENCOUNTER — Telehealth: Payer: Self-pay | Admitting: Family Medicine

## 2013-10-30 DIAGNOSIS — E119 Type 2 diabetes mellitus without complications: Secondary | ICD-10-CM

## 2013-10-30 NOTE — Telephone Encounter (Signed)
[facility] requesting refill of glipiZIDE (GLUCOTROL) 10 MG tablet #200, last filled 07/27/13

## 2013-10-31 MED ORDER — GLIPIZIDE 10 MG PO TABS: 10.0000 mg | ORAL_TABLET | Freq: Two times a day (BID) | ORAL | Status: DC

## 2013-11-02 ENCOUNTER — Other Ambulatory Visit (INDEPENDENT_AMBULATORY_CARE_PROVIDER_SITE_OTHER): Payer: Medicare Other

## 2013-11-02 DIAGNOSIS — Z0000 Encounter for general adult medical examination without abnormal findings: Secondary | ICD-10-CM

## 2013-11-02 DIAGNOSIS — E119 Type 2 diabetes mellitus without complications: Secondary | ICD-10-CM

## 2013-11-02 LAB — CBC WITH DIFFERENTIAL/PLATELET
BASOS ABS: 0 10*3/uL (ref 0.0–0.1)
Basophils Relative: 0.4 % (ref 0.0–3.0)
EOS ABS: 0.3 10*3/uL (ref 0.0–0.7)
Eosinophils Relative: 3.9 % (ref 0.0–5.0)
HEMATOCRIT: 33.6 % — AB (ref 36.0–46.0)
HEMOGLOBIN: 11 g/dL — AB (ref 12.0–15.0)
LYMPHS ABS: 1.7 10*3/uL (ref 0.7–4.0)
Lymphocytes Relative: 21.2 % (ref 12.0–46.0)
MCHC: 32.8 g/dL (ref 30.0–36.0)
MCV: 83.4 fl (ref 78.0–100.0)
MONO ABS: 0.5 10*3/uL (ref 0.1–1.0)
MONOS PCT: 5.6 % (ref 3.0–12.0)
NEUTROS ABS: 5.6 10*3/uL (ref 1.4–7.7)
Neutrophils Relative %: 68.9 % (ref 43.0–77.0)
PLATELETS: 209 10*3/uL (ref 150.0–400.0)
RBC: 4.03 Mil/uL (ref 3.87–5.11)
RDW: 15.8 % — AB (ref 11.5–14.6)
WBC: 8.2 10*3/uL (ref 4.5–10.5)

## 2013-11-02 LAB — BASIC METABOLIC PANEL
BUN: 18 mg/dL (ref 6–23)
CO2: 23 meq/L (ref 19–32)
Calcium: 9.4 mg/dL (ref 8.4–10.5)
Chloride: 112 mEq/L (ref 96–112)
Creatinine, Ser: 1.4 mg/dL — ABNORMAL HIGH (ref 0.4–1.2)
GFR: 38.37 mL/min — AB (ref >60.00)
GLUCOSE: 134 mg/dL — AB (ref 70–99)
POTASSIUM: 4.3 meq/L (ref 3.5–5.1)
SODIUM: 141 meq/L (ref 135–145)

## 2013-11-02 LAB — POCT URINALYSIS DIPSTICK
Blood, UA: NEGATIVE
Glucose, UA: NEGATIVE
Ketones, UA: NEGATIVE
Nitrite, UA: NEGATIVE
Spec Grav, UA: >=1.03
UROBILINOGEN UA: 0.2
pH, UA: 5

## 2013-11-02 LAB — HEPATIC FUNCTION PANEL
ALBUMIN: 3.7 g/dL (ref 3.5–5.2)
ALT: 15 U/L (ref 0–35)
AST: 15 U/L (ref 0–37)
Alkaline Phosphatase: 76 U/L (ref 39–117)
BILIRUBIN TOTAL: 0.4 mg/dL (ref 0.3–1.2)
Bilirubin, Direct: 0 mg/dL (ref 0.0–0.3)
Total Protein: 7 g/dL (ref 6.0–8.3)

## 2013-11-02 LAB — LIPID PANEL
CHOL/HDL RATIO: 2
Cholesterol: 120 mg/dL (ref 0–200)
HDL: 48.4 mg/dL (ref >39.00)
LDL Cholesterol: 49 mg/dL (ref 0–99)
Triglycerides: 114 mg/dL (ref 0.0–149.0)
VLDL: 22.8 mg/dL (ref 0.0–40.0)

## 2013-11-02 LAB — MICROALBUMIN / CREATININE URINE RATIO
Creatinine,U: 225.7 mg/dL
MICROALB/CREAT RATIO: 1.7 mg/g (ref 0.0–30.0)
Microalb, Ur: 3.8 mg/dL — ABNORMAL HIGH (ref 0.0–1.9)

## 2013-11-02 LAB — HEMOGLOBIN A1C: HEMOGLOBIN A1C: 6.8 % — AB (ref 4.6–6.5)

## 2013-11-02 LAB — PSA: PSA: 0 ng/mL — ABNORMAL LOW (ref 0.10–4.00)

## 2013-11-02 LAB — TSH: TSH: 0.71 u[IU]/mL (ref 0.35–5.50)

## 2013-11-09 ENCOUNTER — Encounter: Payer: Self-pay | Admitting: Family Medicine

## 2013-11-09 ENCOUNTER — Ambulatory Visit (INDEPENDENT_AMBULATORY_CARE_PROVIDER_SITE_OTHER): Payer: Medicare Other | Admitting: Family Medicine

## 2013-11-09 VITALS — BP 124/80 | Temp 97.7°F | Ht 64.5 in | Wt 218.0 lb

## 2013-11-09 DIAGNOSIS — N189 Chronic kidney disease, unspecified: Secondary | ICD-10-CM

## 2013-11-09 DIAGNOSIS — Z23 Encounter for immunization: Secondary | ICD-10-CM

## 2013-11-09 DIAGNOSIS — E785 Hyperlipidemia, unspecified: Secondary | ICD-10-CM

## 2013-11-09 DIAGNOSIS — E119 Type 2 diabetes mellitus without complications: Secondary | ICD-10-CM

## 2013-11-09 DIAGNOSIS — Z0000 Encounter for general adult medical examination without abnormal findings: Secondary | ICD-10-CM

## 2013-11-09 DIAGNOSIS — D510 Vitamin B12 deficiency anemia due to intrinsic factor deficiency: Secondary | ICD-10-CM

## 2013-11-09 DIAGNOSIS — M109 Gout, unspecified: Secondary | ICD-10-CM

## 2013-11-09 DIAGNOSIS — D649 Anemia, unspecified: Secondary | ICD-10-CM

## 2013-11-09 DIAGNOSIS — I10 Essential (primary) hypertension: Secondary | ICD-10-CM

## 2013-11-09 MED ORDER — METFORMIN HCL 1000 MG PO TABS: 1000.0000 mg | ORAL_TABLET | Freq: Two times a day (BID) | ORAL | Status: DC

## 2013-11-09 MED ORDER — INSULIN LISPRO PROT & LISPRO (75-25 MIX) 100 UNIT/ML ~~LOC~~ SUSP: 10.0000 [IU] | Freq: Every day | SUBCUTANEOUS | Status: DC

## 2013-11-09 MED ORDER — "INSULIN SYRINGE 29G X 1/2"" 1 ML MISC": 1.0000 | Status: DC

## 2013-11-09 MED ORDER — GLIPIZIDE 10 MG PO TABS: 10.0000 mg | ORAL_TABLET | Freq: Two times a day (BID) | ORAL | Status: DC

## 2013-11-09 MED ORDER — SIMVASTATIN 20 MG PO TABS: 20.0000 mg | ORAL_TABLET | Freq: Every day | ORAL | Status: DC

## 2013-11-09 MED ORDER — GLUCOSE BLOOD VI STRP: 1.0000 | ORAL_STRIP | Freq: Every day | Status: DC

## 2013-11-09 MED ORDER — INSULIN SYRINGE 29G X 1/2" 0.5 ML MISC
1.0000 | Freq: Every day | Status: DC
Start: 2013-11-09 — End: 2014-05-18

## 2013-11-09 MED ORDER — CYANOCOBALAMIN 1000 MCG/ML IJ SOLN: INTRAMUSCULAR | Status: DC

## 2013-11-09 MED ORDER — ALLOPURINOL 300 MG PO TABS: 300.0000 mg | ORAL_TABLET | Freq: Every day | ORAL | Status: DC

## 2013-11-09 MED ORDER — LISINOPRIL 40 MG PO TABS: ORAL_TABLET | ORAL | Status: DC

## 2013-11-09 NOTE — Progress Notes (Signed)
Pre visit review using our clinic review tool, if applicable. No additional management support is needed unless otherwise documented below in the visit note. 

## 2013-11-09 NOTE — Progress Notes (Signed)
   Subjective:    Patient ID: Sherri Pratt, female    DOB: 1946/07/22, 68 y.o.   MRN: [id number]  HPI Darel HongJudy is a 68 year old married female nonsmoker who comes in today for a Medicare wellness examination because of a history of diabetes type 1, gallop, B12 deficiency, hyperlipidemia, hypertension, obesity.  She gets routine eye care, dental care, BSE monthly, and you mammography, and a colonoscopy 10 years they've been normal  Medication reviewed the been no changes  [place] cognitive function normal she does not exercise on a regular basis weight 218 pounds, home health safety reviewed no issues identified, no guns in the house, she does have a health care power of attorney and living well.  She's taking her insulin at bedtime 10 units in getting some lows in the morning 55-65.   Review of Systems  Constitutional: Negative.   HENT: Negative.   Eyes: Negative.   Respiratory: Negative.   Cardiovascular: Negative.   Gastrointestinal: Negative.   Endocrine: Negative.   Genitourinary: Negative.   Musculoskeletal: Negative.   Allergic/Immunologic: Negative.   Neurological: Negative.   Hematological: Negative.   Psychiatric/Behavioral: Negative.        Objective:   Physical Exam  Nursing note and vitals reviewed. Constitutional: She appears well-developed and well-nourished.  HENT:  Head: Normocephalic and atraumatic.  Right Ear: External ear normal.  Left Ear: External ear normal.  Nose: Nose normal.  Mouth/Throat: Oropharynx is clear and moist.  Eyes: EOM are normal. Pupils are equal, round, and reactive to light.  Neck: Normal range of motion. Neck supple. No thyromegaly present.  Cardiovascular: Normal rate, regular rhythm, normal heart sounds and intact distal pulses.  Exam reveals no gallop and no friction rub.   No murmur heard. No carotid or bruits peripheral pulses 2+ and symmetrical no neuropathy  Pulmonary/Chest: Effort normal and breath sounds normal.  Abdominal:  Soft. Bowel sounds are normal. She exhibits no distension and no mass. There is no tenderness. There is no rebound and no guarding.  Massive panniculus  Genitourinary:  Bilateral breast exam normal  She said perhaps for 40+ years all of which have been normal therefore not repeated asymptomatic  Musculoskeletal: Normal range of motion.  Lymphadenopathy:    She has no cervical adenopathy.  Neurological: She is alert. She has normal reflexes. No cranial nerve deficit. She exhibits normal muscle tone. Coordination normal.  Skin: Skin is warm and dry.  Total body skin exam normal  Psychiatric: She has a normal mood and affect. Her behavior is normal. Judgment and thought content normal.          Assessment & Plan:  Healthy female  Diabetes type 1 continue current medications  History of gout continue allopurinol  History of B12 deficiency continue vitamin B12 monthly  Hypertension at goal continue lisinopril 40 mg daily  Hyperlipidemia continue simvastatin 20 mg daily. Followup in 6 months

## 2013-11-09 NOTE — Patient Instructions (Signed)
Continue current medications except take your insulin with the meal instead of at bedtime to avoid the hypoglycemia you're getting in the morning  Start a walking program 15 minutes daily  Return in 6 months for followup  Labs one week prior

## 2013-11-11 ENCOUNTER — Telehealth: Payer: Self-pay

## 2013-11-11 ENCOUNTER — Telehealth: Payer: Self-pay | Admitting: Family Medicine

## 2013-11-11 NOTE — Telephone Encounter (Signed)
Relevant patient education assigned to patient using Emmi. ° °

## 2014-05-02 ENCOUNTER — Other Ambulatory Visit (INDEPENDENT_AMBULATORY_CARE_PROVIDER_SITE_OTHER): Payer: Medicare Other

## 2014-05-02 DIAGNOSIS — E119 Type 2 diabetes mellitus without complications: Secondary | ICD-10-CM

## 2014-05-02 LAB — BASIC METABOLIC PANEL
BUN: 25 mg/dL — ABNORMAL HIGH (ref 6–23)
CALCIUM: 9.5 mg/dL (ref 8.4–10.5)
CO2: 25 mEq/L (ref 19–32)
Chloride: 112 mEq/L (ref 96–112)
Creatinine, Ser: 1.5 mg/dL — ABNORMAL HIGH (ref 0.4–1.2)
GFR: 36 mL/min — AB (ref >60.00)
GLUCOSE: 149 mg/dL — AB (ref 70–99)
POTASSIUM: 4.9 meq/L (ref 3.5–5.1)
SODIUM: 142 meq/L (ref 135–145)

## 2014-05-02 LAB — HEMOGLOBIN A1C: HEMOGLOBIN A1C: 7.1 % — AB (ref 4.6–6.5)

## 2014-05-09 ENCOUNTER — Ambulatory Visit: Payer: Medicare Other | Admitting: Family Medicine

## 2014-05-18 ENCOUNTER — Ambulatory Visit (INDEPENDENT_AMBULATORY_CARE_PROVIDER_SITE_OTHER): Payer: Medicare Other | Admitting: Family Medicine

## 2014-05-18 ENCOUNTER — Encounter: Payer: Self-pay | Admitting: Family Medicine

## 2014-05-18 VITALS — BP 110/80 | Temp 97.5°F | Wt 219.0 lb

## 2014-05-18 DIAGNOSIS — E119 Type 2 diabetes mellitus without complications: Secondary | ICD-10-CM

## 2014-05-18 DIAGNOSIS — I10 Essential (primary) hypertension: Secondary | ICD-10-CM

## 2014-05-18 DIAGNOSIS — N189 Chronic kidney disease, unspecified: Secondary | ICD-10-CM

## 2014-05-18 NOTE — Patient Instructions (Signed)
Continue your current medications  Walk 15 minutes daily  Return in January for your annual exam  Labs one week prior,,,,,,,, 11:30,,,,,,,, or 4:30

## 2014-05-18 NOTE — Progress Notes (Signed)
   Subjective:    Patient ID: Sherri Pratt, female    DOB: [birth year], 68 y.o.   MRN: [id number]  HPI Sherri Pratt is a 68 year old female who comes in today for followup of diabetes, hypertension, renal insufficiency  Her weight is 219 pounds hemoglobin A1c 6 months ago was 6.8 now 7.1. Her medicine as stated the same but she's not exercising on a daily. Encouraged her to walk daily  BP 110/80 on medication  Renal insufficiency stable   Review of Systems    review of systems negative except she's not exercising daily Objective:   Physical Exam  Well-developed well-nourished female no acute distress vital signs stable she is afebrile     Assessment & Plan:  Diabetes type 1,,,,,,, continue current oral medications and insulin followup in 6 months  Hypertension at goal continue current therapy  Renal insufficiency stable  Overweight,,,,,,,,,,, walk 15 minutes daily,,

## 2014-05-18 NOTE — Progress Notes (Signed)
Pre visit review using our clinic review tool, if applicable. No additional management support is needed unless otherwise documented below in the visit note. Lab Results  Component Value Date   HGBA1C 7.1* 05/02/2014   HGBA1C 6.8* 11/02/2013   HGBA1C 7.0* 05/02/2013   Lab Results  Component Value Date   MICROALBUR 3.8* 11/02/2013   LDLCALC 49 11/02/2013   CREATININE 1.5* 05/02/2014

## 2014-07-07 ENCOUNTER — Ambulatory Visit (INDEPENDENT_AMBULATORY_CARE_PROVIDER_SITE_OTHER): Payer: Medicare Other

## 2014-07-07 DIAGNOSIS — Z23 Encounter for immunization: Secondary | ICD-10-CM

## 2014-11-06 ENCOUNTER — Other Ambulatory Visit (INDEPENDENT_AMBULATORY_CARE_PROVIDER_SITE_OTHER): Payer: Medicare Other

## 2014-11-06 DIAGNOSIS — E119 Type 2 diabetes mellitus without complications: Secondary | ICD-10-CM

## 2014-11-06 DIAGNOSIS — N189 Chronic kidney disease, unspecified: Secondary | ICD-10-CM

## 2014-11-06 DIAGNOSIS — I10 Essential (primary) hypertension: Secondary | ICD-10-CM

## 2014-11-06 LAB — HEPATIC FUNCTION PANEL
ALK PHOS: 71 U/L (ref 39–117)
ALT: 10 U/L (ref 0–35)
AST: 14 U/L (ref 0–37)
Albumin: 3.9 g/dL (ref 3.5–5.2)
BILIRUBIN DIRECT: 0.1 mg/dL (ref 0.0–0.3)
Total Bilirubin: 0.5 mg/dL (ref 0.2–1.2)
Total Protein: 6.5 g/dL (ref 6.0–8.3)

## 2014-11-06 LAB — CBC WITH DIFFERENTIAL/PLATELET
BASOS PCT: 0.5 % (ref 0.0–3.0)
Basophils Absolute: 0 10*3/uL (ref 0.0–0.1)
EOS ABS: 0.2 10*3/uL (ref 0.0–0.7)
Eosinophils Relative: 3 % (ref 0.0–5.0)
HCT: 34.6 % — ABNORMAL LOW (ref 36.0–46.0)
HEMOGLOBIN: 11.4 g/dL — AB (ref 12.0–15.0)
Lymphocytes Relative: 20.7 % (ref 12.0–46.0)
Lymphs Abs: 1.6 10*3/uL (ref 0.7–4.0)
MCHC: 32.9 g/dL (ref 30.0–36.0)
MCV: 82.9 fl (ref 78.0–100.0)
Monocytes Absolute: 0.4 10*3/uL (ref 0.1–1.0)
Monocytes Relative: 5.7 % (ref 3.0–12.0)
Neutro Abs: 5.4 10*3/uL (ref 1.4–7.7)
Neutrophils Relative %: 70.1 % (ref 43.0–77.0)
Platelets: 221 10*3/uL (ref 150.0–400.0)
RBC: 4.18 Mil/uL (ref 3.87–5.11)
RDW: 16 % — AB (ref 11.5–15.5)
WBC: 7.7 10*3/uL (ref 4.0–10.5)

## 2014-11-06 LAB — LIPID PANEL
Cholesterol: 129 mg/dL (ref 0–200)
HDL: 46 mg/dL (ref >39.00)
LDL Cholesterol: 56 mg/dL (ref 0–99)
NonHDL: 83
Total CHOL/HDL Ratio: 3
Triglycerides: 135 mg/dL (ref 0.0–149.0)
VLDL: 27 mg/dL (ref 0.0–40.0)

## 2014-11-06 LAB — TSH: TSH: 1.51 u[IU]/mL (ref 0.35–4.50)

## 2014-11-06 LAB — POCT URINALYSIS DIPSTICK
Bilirubin, UA: NEGATIVE
Glucose, UA: NEGATIVE
KETONES UA: NEGATIVE
Leukocytes, UA: NEGATIVE
Nitrite, UA: NEGATIVE
Spec Grav, UA: 1.02
UROBILINOGEN UA: 0.2
pH, UA: 5

## 2014-11-06 LAB — BASIC METABOLIC PANEL
BUN: 14 mg/dL (ref 6–23)
CO2: 23 mEq/L (ref 19–32)
CREATININE: 1.15 mg/dL (ref 0.40–1.20)
Calcium: 9.4 mg/dL (ref 8.4–10.5)
Chloride: 108 mEq/L (ref 96–112)
GFR: 49.59 mL/min — AB (ref >60.00)
Glucose, Bld: 126 mg/dL — ABNORMAL HIGH (ref 70–99)
Potassium: 4 mEq/L (ref 3.5–5.1)
SODIUM: 140 meq/L (ref 135–145)

## 2014-11-06 LAB — MICROALBUMIN / CREATININE URINE RATIO
Creatinine,U: 128.4 mg/dL
Microalb Creat Ratio: 1.9 mg/g (ref 0.0–30.0)
Microalb, Ur: 2.4 mg/dL — ABNORMAL HIGH (ref 0.0–1.9)

## 2014-11-06 LAB — HEMOGLOBIN A1C: HEMOGLOBIN A1C: 7 % — AB (ref 4.6–6.5)

## 2014-11-13 ENCOUNTER — Ambulatory Visit (INDEPENDENT_AMBULATORY_CARE_PROVIDER_SITE_OTHER): Payer: Medicare Other | Admitting: Family Medicine

## 2014-11-13 ENCOUNTER — Encounter: Payer: Self-pay | Admitting: Family Medicine

## 2014-11-13 VITALS — BP 122/76 | HR 60 | Temp 97.3°F | Ht 64.25 in | Wt 215.4 lb

## 2014-11-13 DIAGNOSIS — D510 Vitamin B12 deficiency anemia due to intrinsic factor deficiency: Secondary | ICD-10-CM

## 2014-11-13 DIAGNOSIS — I10 Essential (primary) hypertension: Secondary | ICD-10-CM

## 2014-11-13 DIAGNOSIS — E7801 Familial hypercholesterolemia: Secondary | ICD-10-CM

## 2014-11-13 DIAGNOSIS — E780 Pure hypercholesterolemia: Secondary | ICD-10-CM

## 2014-11-13 DIAGNOSIS — E139 Other specified diabetes mellitus without complications: Secondary | ICD-10-CM

## 2014-11-13 DIAGNOSIS — B369 Superficial mycosis, unspecified: Secondary | ICD-10-CM

## 2014-11-13 DIAGNOSIS — M10079 Idiopathic gout, unspecified ankle and foot: Secondary | ICD-10-CM

## 2014-11-13 DIAGNOSIS — N181 Chronic kidney disease, stage 1: Secondary | ICD-10-CM

## 2014-11-13 DIAGNOSIS — Z0000 Encounter for general adult medical examination without abnormal findings: Secondary | ICD-10-CM

## 2014-11-13 MED ORDER — INSULIN LISPRO PROT & LISPRO (75-25 MIX) 100 UNIT/ML ~~LOC~~ SUSP: 10.0000 [IU] | Freq: Every day | SUBCUTANEOUS | Status: DC

## 2014-11-13 MED ORDER — ALLOPURINOL 300 MG PO TABS
300.0000 mg | ORAL_TABLET | Freq: Every day | ORAL | Status: DC
Start: 2014-11-13 — End: 2015-11-21

## 2014-11-13 MED ORDER — GLIPIZIDE 10 MG PO TABS: 10.0000 mg | ORAL_TABLET | Freq: Two times a day (BID) | ORAL | Status: DC

## 2014-11-13 MED ORDER — CYANOCOBALAMIN 1000 MCG/ML IJ SOLN: INTRAMUSCULAR | Status: DC

## 2014-11-13 MED ORDER — SIMVASTATIN 20 MG PO TABS: 20.0000 mg | ORAL_TABLET | Freq: Every day | ORAL | Status: DC

## 2014-11-13 MED ORDER — METFORMIN HCL 1000 MG PO TABS: 1000.0000 mg | ORAL_TABLET | Freq: Two times a day (BID) | ORAL | Status: DC

## 2014-11-13 MED ORDER — LISINOPRIL 40 MG PO TABS: ORAL_TABLET | ORAL | Status: DC

## 2014-11-13 NOTE — Progress Notes (Signed)
   Subjective:    Patient ID: Sherri Pratt, female    DOB: 1946/04/10, 69 y.o.   MRN: [id number]  HPI Sherri Pratt is a 69 year old married female nonsmoker who comes in today for general physical examination because of a history of gout, diabetes type 1, pernicious anemia, hypertension, obesity  Her weight is unchanged at 215 pounds. She does not exercise on a regular basis  Med list reviewed it's been accurate.  Vaccinations up-to-date she needs a shingles vaccine. Advised to call insurance company  She gets routine eye care, no dental care because she has upper and lower dentures. She does BSE monthly and annual mammography and recent colonoscopy by Dr. Dickie LaBrody was normal.  Home health safety reviewed no issues identified, she does not exercise on a regular basis, home cognitive function normal, no guns in the house, she does not have a healthcare power of attorney and living well. [place] 3 [place]. Asked her to work with one of her sons to get a living well     Review of Systems  Constitutional: Negative.   HENT: Negative.   Eyes: Negative.   Respiratory: Negative.   Cardiovascular: Negative.   Gastrointestinal: Negative.   Endocrine: Negative.   Genitourinary: Negative.   Musculoskeletal: Negative.   Skin: Negative.   Allergic/Immunologic: Negative.   Neurological: Negative.   Hematological: Negative.   Psychiatric/Behavioral: Negative.        Objective:   Physical Exam  Constitutional: She is oriented to person, place, and time. She appears well-developed and well-nourished.  HENT:  Head: Normocephalic and atraumatic.  Right Ear: External ear normal.  Left Ear: External ear normal.  Nose: Nose normal.  Mouth/Throat: Oropharynx is clear and moist.  Eyes: EOM are normal. Pupils are equal, round, and reactive to light.  Neck: Normal range of motion. Neck supple. No JVD present. No tracheal deviation present. No thyromegaly present.  Cardiovascular: Normal  rate, regular rhythm, normal heart sounds and intact distal pulses.  Exam reveals no gallop and no friction rub.   No murmur heard. Pulmonary/Chest: Effort normal and breath sounds normal. No stridor. No respiratory distress. She has no wheezes. She has no rales. She exhibits no tenderness.  Abdominal: Soft. Bowel sounds are normal. She exhibits no distension and no mass. There is no tenderness. There is no rebound and no guarding.  Genitourinary:  Pelvic and Pap normal year ago recommend every 3 years  Bilateral breast exam normal except for numerous seborrheic keratoses  Musculoskeletal: Normal range of motion.  Lymphadenopathy:    She has no cervical adenopathy.  Neurological: She is alert and oriented to person, place, and time. She has normal reflexes. No cranial nerve deficit. She exhibits normal muscle tone. Coordination normal.  No neuropathy  Skin: Skin is warm and dry. No rash noted. No erythema. No pallor.  Psychiatric: She has a normal mood and affect. Her behavior is normal. Judgment and thought content normal.  Nursing note and vitals reviewed.         Assessment & Plan:  Healthy female  History gout continue allopurinol  Diabetes type 1 continue current treatment program but at a 30 minute walking program  Hypertension continue lisinopril 40 mg daily  History of B12 deficiency continue B12 1 mL monthly

## 2014-11-13 NOTE — Patient Instructions (Addendum)
Continue current medications  Start a walking program 30 minutes daily  Nonfasting labs now August  Follow-up in September for your blood sugar  Healthcare power of attorney and living well  Shingles vaccine.......... call your insurance And find out where and you and your husband can get it done  the cheapest

## 2014-11-13 NOTE — Progress Notes (Signed)
Pre visit review using our clinic review tool, if applicable. No additional management support is needed unless otherwise documented below in the visit note. 

## 2014-11-21 ENCOUNTER — Telehealth: Payer: Self-pay

## 2014-11-21 NOTE — Telephone Encounter (Signed)
[facility] refill request for PRODIGY 1/2CC 31G. USE AS DIRECTED TO GIVE HUMALOG. #100

## 2014-11-22 MED ORDER — "INSULIN SYRINGE-NEEDLE U-100 31G X 5/16"" 0.5 ML MISC": 1.0000 | Freq: Every day | Status: DC

## 2014-11-22 NOTE — Telephone Encounter (Signed)
Rx sent 

## 2015-02-05 LAB — HM DIABETES EYE EXAM

## 2015-02-13 ENCOUNTER — Encounter: Payer: Self-pay | Admitting: Family Medicine

## 2015-03-26 ENCOUNTER — Telehealth: Payer: Self-pay

## 2015-03-26 NOTE — Telephone Encounter (Signed)
Not due until 01/03/16

## 2015-06-12 ENCOUNTER — Other Ambulatory Visit (INDEPENDENT_AMBULATORY_CARE_PROVIDER_SITE_OTHER): Payer: Medicare Other

## 2015-06-12 DIAGNOSIS — E119 Type 2 diabetes mellitus without complications: Secondary | ICD-10-CM | POA: Diagnosis not present

## 2015-06-12 DIAGNOSIS — I10 Essential (primary) hypertension: Secondary | ICD-10-CM | POA: Diagnosis not present

## 2015-06-12 LAB — BASIC METABOLIC PANEL
BUN: 18 mg/dL (ref 6–23)
CALCIUM: 9.7 mg/dL (ref 8.4–10.5)
CO2: 25 meq/L (ref 19–32)
CREATININE: 1.16 mg/dL (ref 0.40–1.20)
Chloride: 108 mEq/L (ref 96–112)
GFR: 49.01 mL/min — AB (ref >60.00)
GLUCOSE: 104 mg/dL — AB (ref 70–99)
Potassium: 5.2 mEq/L — ABNORMAL HIGH (ref 3.5–5.1)
Sodium: 140 mEq/L (ref 135–145)

## 2015-06-12 LAB — HEMOGLOBIN A1C: Hgb A1c MFr Bld: 7.1 % — ABNORMAL HIGH (ref 4.6–6.5)

## 2015-06-19 ENCOUNTER — Ambulatory Visit (INDEPENDENT_AMBULATORY_CARE_PROVIDER_SITE_OTHER): Payer: Medicare Other | Admitting: Family Medicine

## 2015-06-19 ENCOUNTER — Encounter: Payer: Self-pay | Admitting: Family Medicine

## 2015-06-19 VITALS — BP 120/84 | Temp 98.1°F | Wt 215.0 lb

## 2015-06-19 DIAGNOSIS — Z23 Encounter for immunization: Secondary | ICD-10-CM

## 2015-06-19 DIAGNOSIS — I10 Essential (primary) hypertension: Secondary | ICD-10-CM | POA: Diagnosis not present

## 2015-06-19 DIAGNOSIS — E139 Other specified diabetes mellitus without complications: Secondary | ICD-10-CM

## 2015-06-19 NOTE — Progress Notes (Signed)
   Subjective:    Patient ID: Sherri Pratt, female    DOB: 1946/06/14, 69 y.o.   MRN: [id number]  HPI  Sherri Pratt is a 69 year old married female nonsmoker who comes in today for follow-up of diabetes type 1 and hypertension  She's on a combination of Glucotrol 10 mg twice a day, metformin 1000 mg twice a day, and insulin 10 units daily at breakfast. She takes the Humalog mix 75-25.  She has overall she feels well her weight is a same 215 pounds as it was last February however her blood sugars down her A1c is dropped too. She's changed her diet and is now avoiding all carbohydrates. She's not walking daily  BP 120/84 on lisinopril 60 mg daily  Blood sugar down to 104 A1c 7.1%   Review of Systems Review of systems otherwise negative no hypoglycemia    Objective:   Physical Exam  Well-developed well-nourished female no acute distress vital signs stable she's afebrile BP 120/84      Assessment & Plan:  Diabetes type 1 at goal........ continue current therapy follow-up in 6 months  Hypertension at goal......... continue current therapy

## 2015-06-19 NOTE — Progress Notes (Signed)
Pre visit review using our clinic review tool, if applicable. No additional management support is needed unless otherwise documented below in the visit note. 

## 2015-06-19 NOTE — Patient Instructions (Signed)
Continue current medications  Start a walking program........ 20 minutes daily  Return in February for follow-up and annual physical examination  Labs fasting one week prior

## 2015-09-17 ENCOUNTER — Other Ambulatory Visit: Payer: Self-pay | Admitting: Family Medicine

## 2015-11-14 ENCOUNTER — Other Ambulatory Visit (INDEPENDENT_AMBULATORY_CARE_PROVIDER_SITE_OTHER): Payer: Medicare Other

## 2015-11-14 DIAGNOSIS — E139 Other specified diabetes mellitus without complications: Secondary | ICD-10-CM | POA: Diagnosis not present

## 2015-11-14 LAB — HEPATIC FUNCTION PANEL
ALBUMIN: 4 g/dL (ref 3.5–5.2)
ALK PHOS: 68 U/L (ref 39–117)
ALT: 8 U/L (ref 0–35)
AST: 11 U/L (ref 0–37)
BILIRUBIN DIRECT: 0.1 mg/dL (ref 0.0–0.3)
Total Bilirubin: 0.6 mg/dL (ref 0.2–1.2)
Total Protein: 6.9 g/dL (ref 6.0–8.3)

## 2015-11-14 LAB — CBC WITH DIFFERENTIAL/PLATELET
BASOS PCT: 0.4 % (ref 0.0–3.0)
Basophils Absolute: 0 10*3/uL (ref 0.0–0.1)
EOS PCT: 1.8 % (ref 0.0–5.0)
Eosinophils Absolute: 0.1 10*3/uL (ref 0.0–0.7)
HEMATOCRIT: 36.1 % (ref 36.0–46.0)
HEMOGLOBIN: 11.4 g/dL — AB (ref 12.0–15.0)
LYMPHS ABS: 1.5 10*3/uL (ref 0.7–4.0)
Lymphocytes Relative: 19.8 % (ref 12.0–46.0)
MCHC: 31.7 g/dL (ref 30.0–36.0)
MCV: 83.4 fl (ref 78.0–100.0)
MONO ABS: 0.4 10*3/uL (ref 0.1–1.0)
MONOS PCT: 5.9 % (ref 3.0–12.0)
NEUTROS ABS: 5.5 10*3/uL (ref 1.4–7.7)
Neutrophils Relative %: 72.1 % (ref 43.0–77.0)
PLATELETS: 233 10*3/uL (ref 150.0–400.0)
RBC: 4.33 Mil/uL (ref 3.87–5.11)
RDW: 15.6 % — AB (ref 11.5–15.5)
WBC: 7.7 10*3/uL (ref 4.0–10.5)

## 2015-11-14 LAB — HEMOGLOBIN A1C: Hgb A1c MFr Bld: 6.6 % — ABNORMAL HIGH (ref 4.6–6.5)

## 2015-11-14 LAB — BASIC METABOLIC PANEL
BUN: 13 mg/dL (ref 6–23)
CHLORIDE: 105 meq/L (ref 96–112)
CO2: 23 mEq/L (ref 19–32)
Calcium: 9.6 mg/dL (ref 8.4–10.5)
Creatinine, Ser: 1.18 mg/dL (ref 0.40–1.20)
GFR: 48 mL/min — ABNORMAL LOW (ref >60.00)
Glucose, Bld: 74 mg/dL (ref 70–99)
POTASSIUM: 3.7 meq/L (ref 3.5–5.1)
SODIUM: 140 meq/L (ref 135–145)

## 2015-11-14 LAB — LIPID PANEL
CHOL/HDL RATIO: 3
Cholesterol: 133 mg/dL (ref 0–200)
HDL: 48.7 mg/dL (ref >39.00)
LDL Cholesterol: 53 mg/dL (ref 0–99)
NONHDL: 84.52
Triglycerides: 160 mg/dL — ABNORMAL HIGH (ref 0.0–149.0)
VLDL: 32 mg/dL (ref 0.0–40.0)

## 2015-11-14 LAB — MICROALBUMIN / CREATININE URINE RATIO
CREATININE, U: 183.8 mg/dL
Microalb Creat Ratio: 1 mg/g (ref 0.0–30.0)
Microalb, Ur: 1.9 mg/dL (ref 0.0–1.9)

## 2015-11-14 LAB — POCT URINALYSIS DIPSTICK
BILIRUBIN UA: NEGATIVE
GLUCOSE UA: NEGATIVE
Ketones, UA: NEGATIVE
Leukocytes, UA: NEGATIVE
Nitrite, UA: NEGATIVE
PH UA: 5.5
RBC UA: NEGATIVE
SPEC GRAV UA: 1.025
Urobilinogen, UA: 0.2

## 2015-11-14 LAB — TSH: TSH: 1.42 u[IU]/mL (ref 0.35–4.50)

## 2015-11-21 ENCOUNTER — Ambulatory Visit (INDEPENDENT_AMBULATORY_CARE_PROVIDER_SITE_OTHER): Payer: Medicare Other | Admitting: Family Medicine

## 2015-11-21 ENCOUNTER — Encounter: Payer: Self-pay | Admitting: Family Medicine

## 2015-11-21 VITALS — BP 120/84 | Temp 97.5°F | Ht 64.0 in | Wt 209.0 lb

## 2015-11-21 DIAGNOSIS — M109 Gout, unspecified: Secondary | ICD-10-CM

## 2015-11-21 DIAGNOSIS — Z0000 Encounter for general adult medical examination without abnormal findings: Secondary | ICD-10-CM | POA: Diagnosis not present

## 2015-11-21 DIAGNOSIS — E785 Hyperlipidemia, unspecified: Secondary | ICD-10-CM

## 2015-11-21 DIAGNOSIS — E7801 Familial hypercholesterolemia: Secondary | ICD-10-CM

## 2015-11-21 DIAGNOSIS — E139 Other specified diabetes mellitus without complications: Secondary | ICD-10-CM | POA: Diagnosis not present

## 2015-11-21 DIAGNOSIS — D510 Vitamin B12 deficiency anemia due to intrinsic factor deficiency: Secondary | ICD-10-CM

## 2015-11-21 DIAGNOSIS — M10079 Idiopathic gout, unspecified ankle and foot: Secondary | ICD-10-CM

## 2015-11-21 DIAGNOSIS — I10 Essential (primary) hypertension: Secondary | ICD-10-CM

## 2015-11-21 DIAGNOSIS — N181 Chronic kidney disease, stage 1: Secondary | ICD-10-CM

## 2015-11-21 MED ORDER — ALLOPURINOL 300 MG PO TABS: 300.0000 mg | ORAL_TABLET | Freq: Every day | ORAL | Status: DC

## 2015-11-21 MED ORDER — METFORMIN HCL 1000 MG PO TABS: 1000.0000 mg | ORAL_TABLET | Freq: Two times a day (BID) | ORAL | Status: DC

## 2015-11-21 MED ORDER — "INSULIN SYRINGE-NEEDLE U-100 31G X 5/16"" 0.5 ML MISC": 1.0000 | Freq: Every day | Status: DC

## 2015-11-21 MED ORDER — INSULIN LISPRO PROT & LISPRO (75-25 MIX) 100 UNIT/ML ~~LOC~~ SUSP: 10.0000 [IU] | Freq: Every day | SUBCUTANEOUS | Status: DC

## 2015-11-21 MED ORDER — GLIPIZIDE 10 MG PO TABS: 10.0000 mg | ORAL_TABLET | Freq: Two times a day (BID) | ORAL | Status: DC

## 2015-11-21 MED ORDER — LISINOPRIL 40 MG PO TABS: ORAL_TABLET | ORAL | Status: DC

## 2015-11-21 MED ORDER — CYANOCOBALAMIN 1000 MCG/ML IJ SOLN: INTRAMUSCULAR | Status: DC

## 2015-11-21 MED ORDER — SIMVASTATIN 20 MG PO TABS: 20.0000 mg | ORAL_TABLET | Freq: Every day | ORAL | Status: DC

## 2015-11-21 NOTE — Progress Notes (Signed)
   Subjective:    Patient ID: Sherri Pratt, female    DOB: 01-Oct-1946, 70 y.o.   MRN: [id number]  HPI Sherri Pratt is a 70 year old married female nonsmoker who comes in today for general physical examination  She has a history of gout and takes allopurinol 300 mg daily. Asymptomatic on medication  She has history of B12 deficiency. She takes vitamin B12 1 mL monthly  She has diabetes type 1 on Glucotrol 10 mg twice a day, metformin 1000 mg twice a day, and insulin 10 units at breakfast. Blood sugar is in the 78-80 range in the morning. A1c 6.6%  She takes lisinopril 60 mg daily for hypertension BP today 120/84  She takes Zocor 20 mg daily for hyperlipidemia. Also her weight is dropped from 6 pounds from last year. Encouraged to continue diet exercise and weight loss program. Obviously she has classic metabolic syndrome  She gets routine eye care, dental care,,,,,, upper denture,,,,, last colonoscopy of Dr. Dickie La was normal. Mammogram March 2015. Advised to do BSE monthly and get annual mammography.  Pelvic and Pap 2015 normal asymptomatic therefore every 3 years.  Vaccinations up-to-date including a shingles vaccine   Review of Systems  Constitutional: Negative.   HENT: Negative.   Eyes: Negative.   Respiratory: Negative.   Cardiovascular: Negative.   Gastrointestinal: Negative.   Endocrine: Negative.   Genitourinary: Negative.   Musculoskeletal: Negative.   Skin: Negative.   Allergic/Immunologic: Negative.   Neurological: Negative.   Hematological: Negative.   Psychiatric/Behavioral: Negative.        Objective:   Physical Exam  Constitutional: She appears well-developed and well-nourished.  HENT:  Head: Normocephalic and atraumatic.  Right Ear: External ear normal.  Left Ear: External ear normal.  Nose: Nose normal.  Mouth/Throat: Oropharynx is clear and moist.  Eyes: EOM are normal. Pupils are equal, round, and reactive to light.  Neck: Normal range of motion. Neck  supple. No JVD present. No tracheal deviation present. No thyromegaly present.  Cardiovascular: Normal rate, regular rhythm, normal heart sounds and intact distal pulses.  Exam reveals no gallop and no friction rub.   No murmur heard. No carotid nor aortic bruits peripheral pulses 2+ and symmetrical  Pulmonary/Chest: Effort normal and breath sounds normal. No stridor. No respiratory distress. She has no wheezes. She has no rales. She exhibits no tenderness.  Abdominal: Soft. Bowel sounds are normal. She exhibits no distension and no mass. There is no tenderness. There is no rebound and no guarding.  Genitourinary:  Bilateral breast exam normal  Musculoskeletal: Normal range of motion.  Lymphadenopathy:    She has no cervical adenopathy.  Neurological: She is alert. She has normal reflexes. No cranial nerve deficit. She exhibits normal muscle tone. Coordination normal.  Skin: Skin is warm and dry. No rash noted. No erythema. No pallor.  Total body skin exam normal except for numerous seborrheic keratosis  Psychiatric: She has a normal mood and affect. Her behavior is normal. Judgment and thought content normal.  Nursing note and vitals reviewed.         Assessment & Plan:  Classic metabolic syndrome  ............... diabetes at goal........ continue current therapy  Hypertension at goal.....[name] continue current therapy  Hyperlipidemia at goal.....[name] continue current therapy  Obesity..........[name] weight loss 6 pounds............[name]

## 2015-11-21 NOTE — Progress Notes (Signed)
Pre visit review using our clinic review tool, if applicable. No additional management support is needed unless otherwise documented below in the visit note. 

## 2015-11-21 NOTE — Patient Instructions (Signed)
Call and get set up for your screening mammogram.  Call GI........ [phone]......... to inquire about a follow-up colonoscopy  Walk 30 minutes daily  Continue current medications  Check a fasting blood sugar Monday Wednesday Friday.....[name] if your blood sugar drops below 80 in the morning...Marland KitchenMarland KitchenMarland Kitchen decrease the Glucotrol to 10 mg in the morning and 5 mg before your evening female  Follow-up in 6 months........ you might want to establish at the about her [facility] office since is closer to your home......[name] or feel like to come here we have 3 new people........ Atilano Median or adult nurse practitioner's or Dr. [place]  Talk with your family about a healthcare power of attorney and living well  Call your insurance company and find out where you can get the shingles vaccine  Baby aspirin Monday Wednesday Friday

## 2016-01-18 ENCOUNTER — Telehealth: Payer: Self-pay | Admitting: *Deleted

## 2016-01-18 MED ORDER — "INSULIN SYRINGE-NEEDLE U-100 31G X 5/16"" 0.5 ML MISC": Status: DC

## 2016-01-21 NOTE — Telephone Encounter (Signed)
rx sent

## 2016-02-27 DIAGNOSIS — H5203 Hypermetropia, bilateral: Secondary | ICD-10-CM | POA: Diagnosis not present

## 2016-03-06 ENCOUNTER — Encounter: Payer: Self-pay | Admitting: Adult Health

## 2016-03-06 ENCOUNTER — Ambulatory Visit (INDEPENDENT_AMBULATORY_CARE_PROVIDER_SITE_OTHER): Payer: Medicare Other | Admitting: Adult Health

## 2016-03-06 DIAGNOSIS — N183 Chronic kidney disease, stage 3 unspecified: Secondary | ICD-10-CM

## 2016-03-06 DIAGNOSIS — E139 Other specified diabetes mellitus without complications: Secondary | ICD-10-CM

## 2016-03-06 DIAGNOSIS — Z23 Encounter for immunization: Secondary | ICD-10-CM | POA: Diagnosis not present

## 2016-03-06 DIAGNOSIS — Z7189 Other specified counseling: Secondary | ICD-10-CM

## 2016-03-06 DIAGNOSIS — I10 Essential (primary) hypertension: Secondary | ICD-10-CM

## 2016-03-06 DIAGNOSIS — Z7689 Persons encountering health services in other specified circumstances: Secondary | ICD-10-CM

## 2016-03-06 MED ORDER — PNEUMOCOCCAL VAC POLYVALENT 25 MCG/0.5ML IJ INJ: 0.5000 mL | INJECTION | INTRAMUSCULAR | Status: DC

## 2016-03-06 NOTE — Patient Instructions (Signed)
It was great meeting you today!   Work on diet and increasing the time you walk.   I will see you in August for a follow up appointment for diabetes.   If you need anything in the meantime, please let me know

## 2016-03-06 NOTE — Progress Notes (Signed)
Pre visit review using our clinic review tool, if applicable. No additional management support is needed unless otherwise documented below in the visit note. 

## 2016-03-06 NOTE — Progress Notes (Signed)
Patient presents to clinic today to establish care. She is a pleasant 70 year old female who  has a past medical history of Anemia; Diabetes mellitus; Hypertension; and Hyperlipidemia.   Her last physical was in Feb  2017 with MD Sherri Pratt.   She has no acute concerns  Acute Concerns: Establish Care  Chronic Issues: CKD  - She feels as though this is well controlled. Does not see Nephrology   Diabetes Type 1 She has diabetes type 1 on Glucotrol 1.5 tablets mg  Once a day, metformin 1000 mg twice a day, and insulin 10 units at breakfast. Blood sugar is in the 78-80 range in the morning. A1c 6.6%  Essential Hypertension  - Well controlled. She denies any symptoms    Gout  - No recent gout flares.   Health Maintenance: Dental -- She has dentures  Vision -- Yearly  Immunizations -- UTD  Colonoscopy -- With Dr. Dickie Pratt - do not have file  Mammogram --2010 - She has an appointment tomorrow morning.  PAP -- 2015 - Normal  Diet: Tries to eat healthy  Exercise: She walks 15 minutes per day.    She is not followed by anyone else   Past Medical History  Diagnosis Date  . Anemia   . Diabetes mellitus   . Hypertension   . Hyperlipidemia     Past Surgical History  Procedure Laterality Date  . Appendectomy      Current Outpatient Prescriptions on File Prior to Visit  Medication Sig Dispense Refill  . allopurinol (ZYLOPRIM) 300 MG tablet Take 1 tablet (300 mg total) by mouth daily. 100 tablet 3  . cyanocobalamin (,VITAMIN B-12,) 1000 MCG/ML injection 1 cc monthly 30 mL 1  . glipiZIDE (GLUCOTROL) 10 MG tablet Take 1 tablet (10 mg total) by mouth 2 (two) times daily before a meal. 200 tablet 3  . glucose blood (BAYER CONTOUR TEST) test strip 1 each by Other route daily. Dx 250.01 100 each 12  . insulin lispro protamine-lispro (HUMALOG 75/25 MIX) (75-25) 100 UNIT/ML SUSP injection Inject 10 Units into the skin daily with breakfast. 10 mL 12  . Insulin Syringe-Needle U-100 31G X  5/16" 0.5 ML MISC Use daily dx e11.9 100 each 3  . lisinopril (PRINIVIL,ZESTRIL) 40 MG tablet One and half tabs daily 150 tablet 3  . metFORMIN (GLUCOPHAGE) 1000 MG tablet Take 1 tablet (1,000 mg total) by mouth 2 (two) times daily with a meal. 200 tablet 3  . simvastatin (ZOCOR) 20 MG tablet Take 1 tablet (20 mg total) by mouth at bedtime. 100 tablet 3   No current facility-administered medications on file prior to visit.    No Known Allergies  Family History  Problem Relation Age of Onset  . Cancer Mother     colon  . Cancer Father     colon    Social History   Social History  . Marital Status: Married    Spouse Name: N/A  . Number of Children: N/A  . Years of Education: N/A   Occupational History  . Not on file.   Social History Main Topics  . Smoking status: Never Smoker   . Smokeless tobacco: Not on file  . Alcohol Use: No  . Drug Use: No  . Sexual Activity: Not on file   Other Topics Concern  . Not on file   Social History Narrative    Review of Systems  Constitutional: Negative.   HENT: Negative.   Respiratory: Negative.  Cardiovascular: Negative.   Gastrointestinal: Negative.   Genitourinary: Negative.   Neurological: Negative.     There were no vitals taken for this visit.  Physical Exam  Constitutional: She is oriented to person, place, and time. No distress.  HENT:  Head: Normocephalic and atraumatic.  Right Ear: External ear normal.  Left Ear: External ear normal.  Nose: Nose normal.  Mouth/Throat: Oropharynx is clear and moist. No oropharyngeal exudate.  Cardiovascular: Normal rate, regular rhythm, normal heart sounds and intact distal pulses.  Exam reveals no gallop and no friction rub.   No murmur heard. Pulmonary/Chest: Effort normal and breath sounds normal. No respiratory distress. She has no wheezes. She has no rales. She exhibits no tenderness.  Neurological: She is alert and oriented to person, place, and time. Gait normal. GCS  score is 15.  Skin: Skin is warm and dry. No rash noted. She is not diaphoretic. No erythema. No pallor.  Psychiatric: Mood, memory, affect and judgment normal.  Nursing note and vitals reviewed.   Assessment/Plan:   1. Encounter to establish care - Follow up in Feb. For next CPE - Follow up in 3 months for diabetes follow up - Work on diet and exercise  2. Essential hypertension - Well controlled, no change  3. Diabetes 1.5, managed as type 1 (HCC) - Well controlled, no change   4. Chronic kidney disease, stage 3 (moderate) - Stable over the last 2 years.   5. Need for prophylactic vaccination against Streptococcus pneumoniae (pneumococcus) - pneumococcal 23 valent vaccine (PNU-IMMUNE) injection 0.5 mL; Inject 0.5 mLs into the muscle tomorrow at 10 am.   [name], NP

## 2016-03-07 DIAGNOSIS — Z1231 Encounter for screening mammogram for malignant neoplasm of breast: Secondary | ICD-10-CM | POA: Diagnosis not present

## 2016-03-07 LAB — HM MAMMOGRAPHY

## 2016-03-11 ENCOUNTER — Encounter: Payer: Self-pay | Admitting: Adult Health

## 2016-07-17 ENCOUNTER — Encounter: Payer: Self-pay | Admitting: Adult Health

## 2016-07-17 ENCOUNTER — Ambulatory Visit (INDEPENDENT_AMBULATORY_CARE_PROVIDER_SITE_OTHER): Payer: Medicare Other | Admitting: Adult Health

## 2016-07-17 DIAGNOSIS — Z23 Encounter for immunization: Secondary | ICD-10-CM

## 2016-07-17 DIAGNOSIS — I10 Essential (primary) hypertension: Secondary | ICD-10-CM | POA: Diagnosis not present

## 2016-07-17 DIAGNOSIS — E139 Other specified diabetes mellitus without complications: Secondary | ICD-10-CM

## 2016-07-17 DIAGNOSIS — E109 Type 1 diabetes mellitus without complications: Secondary | ICD-10-CM | POA: Diagnosis not present

## 2016-07-17 LAB — POCT GLYCOSYLATED HEMOGLOBIN (HGB A1C): Hemoglobin A1C: 6.7

## 2016-07-17 NOTE — Progress Notes (Signed)
Pre visit review using our clinic review tool, if applicable. No additional management support is needed unless otherwise documented below in the visit note. 

## 2016-07-17 NOTE — Progress Notes (Addendum)
Subjective:    Patient ID: Sherri Pratt, female    DOB: [birth year], 70 y.o.   MRN: [id number]  HPI  70 year old female who  has a past medical history of Anemia; Chronic kidney disease; Diabetes mellitus; Gout; Hyperlipidemia; and Hypertension. She presents to the office today for follow up regarding diabetes and hypertension. Her last A1c was 6.6 in February 2017. Her diabetes is controlled by Glipizide 10 mg, Humalog, and Metformin.   Her blood pressure is well controlled with lisinopril 40 mg. Her blood pressure is 140/70 today and she has not taken her blood pressure medication.   Today in the office she reports that she is doing well. She is taking her medications as directed. She is trying to eat healthy and continues to walk.    Review of Systems  Constitutional: Negative.   Respiratory: Negative.   Cardiovascular: Negative.   Genitourinary: Negative.   Neurological: Negative.   All other systems reviewed and are negative.  Past Medical History:  Diagnosis Date  . Anemia   . Chronic kidney disease   . Diabetes mellitus   . Gout   . Hyperlipidemia   . Hypertension     Social History   Social History  . Marital status: Married    Spouse name: N/A  . Number of children: N/A  . Years of education: N/A   Occupational History  . Not on file.   Social History Main Topics  . Smoking status: Never Smoker  . Smokeless tobacco: Not on file  . Alcohol use No  . Drug use: No  . Sexual activity: Not on file   Other Topics Concern  . Not on file   Social History Narrative   Retired - She Forensic psychologist   Married for 51 years    Three children - all in [place]      She likes to quilting and shopping.     Past Surgical History:  Procedure Laterality Date  . APPENDECTOMY      Family History  Problem Relation Age of Onset  . Cancer Mother     colon  . Cancer Father     colon  . Hypertension Mother     No Known Allergies  Current Outpatient Prescriptions  on File Prior to Visit  Medication Sig Dispense Refill  . allopurinol (ZYLOPRIM) 300 MG tablet Take 1 tablet (300 mg total) by mouth daily. 100 tablet 3  . cyanocobalamin (,VITAMIN B-12,) 1000 MCG/ML injection 1 cc monthly 30 mL 1  . glipiZIDE (GLUCOTROL) 10 MG tablet Take 1 tablet (10 mg total) by mouth 2 (two) times daily before a meal. (Patient taking differently: Take 15 mg by mouth 2 (two) times daily before a meal. ) 200 tablet 3  . glucose blood (BAYER CONTOUR TEST) test strip 1 each by Other route daily. Dx 250.01 100 each 12  . insulin lispro protamine-lispro (HUMALOG 75/25 MIX) (75-25) 100 UNIT/ML SUSP injection Inject 10 Units into the skin daily with breakfast. 10 mL 12  . Insulin Syringe-Needle U-100 31G X 5/16" 0.5 ML MISC Use daily dx e11.9 100 each 3  . lisinopril (PRINIVIL,ZESTRIL) 40 MG tablet One and half tabs daily 150 tablet 3  . metFORMIN (GLUCOPHAGE) 1000 MG tablet Take 1 tablet (1,000 mg total) by mouth 2 (two) times daily with a meal. 200 tablet 3  . simvastatin (ZOCOR) 20 MG tablet Take 1 tablet (20 mg total) by mouth at bedtime. 100 tablet 3   No  current facility-administered medications on file prior to visit.     BP 140/70 (BP Location: Left Arm, Patient Position: Sitting, Cuff Size: Large)   Pulse 63   Temp 97.6 F (36.4 C) (Oral)   Resp 20   Ht 5\' 4"  (1.626 m)   Wt 214 lb 6.1 oz (97.2 kg)   SpO2 99%   BMI 36.80 kg/m       Objective:   Physical Exam  Constitutional: She is oriented to person, place, and time. She appears well-developed and well-nourished. No distress.  HENT:  Head: Normocephalic and atraumatic.  Right Ear: External ear normal.  Left Ear: External ear normal.  Nose: Nose normal.  Mouth/Throat: Oropharynx is clear and moist. No oropharyngeal exudate.  Eyes: Conjunctivae and EOM are normal. Pupils are equal, round, and reactive to light. Right eye exhibits no discharge. No scleral icterus.  Cardiovascular: Normal rate, regular rhythm,  normal heart sounds and intact distal pulses.  Exam reveals no gallop and no friction rub.   No murmur heard. Pulmonary/Chest: Effort normal and breath sounds normal. No respiratory distress. She has no wheezes. She has no rales. She exhibits no tenderness.  Abdominal: Soft. Bowel sounds are normal. She exhibits no distension and no mass. There is no tenderness. There is no rebound and no guarding.  Musculoskeletal: Normal range of motion. She exhibits no edema, tenderness or deformity.  Neurological: She is alert and oriented to person, place, and time. She has normal reflexes. She displays normal reflexes. No cranial nerve deficit. She exhibits normal muscle tone. Coordination normal.  Skin: Skin is warm and dry. No rash noted. She is not diaphoretic. No erythema. No pallor.  Psychiatric: She has a normal mood and affect. Her behavior is normal. Judgment and thought content normal.  Nursing note and vitals reviewed.     Assessment & Plan:  1. Essential hypertension - Well controlled on current medication  - No change   2. Diabetes 1.5, managed as type 1 (HCC) - POCT glycosylated hemoglobin (Hb A1C) - 6.7 - Continue with current medication therapy - Increase exercise - Continue to work on diabetic diet  Sherri Freesory Tinley Rought, NP

## 2016-11-10 ENCOUNTER — Other Ambulatory Visit (INDEPENDENT_AMBULATORY_CARE_PROVIDER_SITE_OTHER): Payer: Medicare Other

## 2016-11-10 DIAGNOSIS — Z0000 Encounter for general adult medical examination without abnormal findings: Secondary | ICD-10-CM | POA: Diagnosis not present

## 2016-11-10 LAB — BASIC METABOLIC PANEL
BUN: 22 mg/dL (ref 6–23)
CALCIUM: 9.1 mg/dL (ref 8.4–10.5)
CO2: 24 mEq/L (ref 19–32)
Chloride: 109 mEq/L (ref 96–112)
Creatinine, Ser: 1.04 mg/dL (ref 0.40–1.20)
GFR: 55.37 mL/min — AB (ref >60.00)
GLUCOSE: 148 mg/dL — AB (ref 70–99)
Potassium: 4.4 mEq/L (ref 3.5–5.1)
Sodium: 141 mEq/L (ref 135–145)

## 2016-11-10 LAB — MICROALBUMIN / CREATININE URINE RATIO
Creatinine,U: 130.4 mg/dL
Microalb Creat Ratio: 1 mg/g (ref 0.0–30.0)
Microalb, Ur: 1.3 mg/dL (ref 0.0–1.9)

## 2016-11-10 LAB — CBC WITH DIFFERENTIAL/PLATELET
BASOS ABS: 0.1 10*3/uL (ref 0.0–0.1)
Basophils Relative: 1 % (ref 0.0–3.0)
EOS ABS: 0.3 10*3/uL (ref 0.0–0.7)
Eosinophils Relative: 4.5 % (ref 0.0–5.0)
HCT: 32.7 % — ABNORMAL LOW (ref 36.0–46.0)
HEMOGLOBIN: 10.8 g/dL — AB (ref 12.0–15.0)
LYMPHS ABS: 1.8 10*3/uL (ref 0.7–4.0)
Lymphocytes Relative: 22.9 % (ref 12.0–46.0)
MCHC: 33.1 g/dL (ref 30.0–36.0)
MCV: 83.9 fl (ref 78.0–100.0)
MONO ABS: 0.5 10*3/uL (ref 0.1–1.0)
Monocytes Relative: 6.3 % (ref 3.0–12.0)
NEUTROS PCT: 65.3 % (ref 43.0–77.0)
Neutro Abs: 5 10*3/uL (ref 1.4–7.7)
Platelets: 191 10*3/uL (ref 150.0–400.0)
RBC: 3.89 Mil/uL (ref 3.87–5.11)
RDW: 16.4 % — ABNORMAL HIGH (ref 11.5–15.5)
WBC: 7.7 10*3/uL (ref 4.0–10.5)

## 2016-11-10 LAB — HEPATIC FUNCTION PANEL
ALK PHOS: 66 U/L (ref 39–117)
ALT: 7 U/L (ref 0–35)
AST: 10 U/L (ref 0–37)
Albumin: 3.9 g/dL (ref 3.5–5.2)
BILIRUBIN DIRECT: 0.1 mg/dL (ref 0.0–0.3)
BILIRUBIN TOTAL: 0.3 mg/dL (ref 0.2–1.2)
Total Protein: 6 g/dL (ref 6.0–8.3)

## 2016-11-10 LAB — POC URINALSYSI DIPSTICK (AUTOMATED)
BILIRUBIN UA: NEGATIVE
Blood, UA: NEGATIVE
Glucose, UA: NEGATIVE
KETONES UA: NEGATIVE
Nitrite, UA: NEGATIVE
PH UA: 5
Protein, UA: NEGATIVE
SPEC GRAV UA: 1.02
Urobilinogen, UA: 0.2

## 2016-11-10 LAB — HEMOGLOBIN A1C: Hgb A1c MFr Bld: 6.7 % — ABNORMAL HIGH (ref 4.6–6.5)

## 2016-11-10 LAB — LIPID PANEL
CHOL/HDL RATIO: 3
CHOLESTEROL: 136 mg/dL (ref 0–200)
HDL: 41.6 mg/dL (ref >39.00)
LDL CALC: 58 mg/dL (ref 0–99)
NonHDL: 94.05
Triglycerides: 181 mg/dL — ABNORMAL HIGH (ref 0.0–149.0)
VLDL: 36.2 mg/dL (ref 0.0–40.0)

## 2016-11-10 LAB — TSH: TSH: 1.22 u[IU]/mL (ref 0.35–4.50)

## 2016-11-21 ENCOUNTER — Encounter: Payer: Medicare Other | Admitting: Adult Health

## 2016-11-25 ENCOUNTER — Ambulatory Visit (INDEPENDENT_AMBULATORY_CARE_PROVIDER_SITE_OTHER): Payer: Medicare Other | Admitting: Adult Health

## 2016-11-25 VITALS — BP 124/70 | Temp 97.5°F | Ht 64.0 in | Wt 208.6 lb

## 2016-11-25 DIAGNOSIS — E785 Hyperlipidemia, unspecified: Secondary | ICD-10-CM | POA: Diagnosis not present

## 2016-11-25 DIAGNOSIS — E109 Type 1 diabetes mellitus without complications: Secondary | ICD-10-CM | POA: Diagnosis not present

## 2016-11-25 DIAGNOSIS — I10 Essential (primary) hypertension: Secondary | ICD-10-CM

## 2016-11-25 DIAGNOSIS — Z0000 Encounter for general adult medical examination without abnormal findings: Secondary | ICD-10-CM | POA: Diagnosis not present

## 2016-11-25 DIAGNOSIS — E139 Other specified diabetes mellitus without complications: Secondary | ICD-10-CM

## 2016-11-25 NOTE — Progress Notes (Signed)
Subjective:    Patient ID: Sherri BiddingJudy C Cotto, female    DOB: 27-May-1946, 71 y.o.   MRN: [id number]  HPI  Patient presents for yearly preventative medicine examination. She is a pleasant 71 year old female who  has a past medical history of Anemia; Chronic kidney disease; Diabetes mellitus; Gout; Hyperlipidemia; and Hypertension.   All immunizations and health maintenance protocols were reviewed with the patient and needed orders were placed.  Appropriate screening laboratory values were ordered for the patient including screening of hyperlipidemia, renal function and hepatic function. If indicated by BPH, a PSA was ordered.  Medication reconciliation,  past medical history, social history, problem list and allergies were reviewed in detail with the patient  Goals were established with regard to weight loss, exercise, and  diet in compliance with medications. Her diet and exercise regimen have been suffering  End of life planning was discussed. She does not have an advanced directive or living will.   She is up to date on her dental visits, vision exams, colonoscopies, mammograms.   He reports that her blood sugars have been in the 170's as she has been eating more sweets and has not been walking.   She has no acute complaints today   Review of Systems  Constitutional: Negative.   HENT: Negative.   Eyes: Negative.   Respiratory: Negative.   Cardiovascular: Negative.   Endocrine: Negative.   Genitourinary: Negative.   Musculoskeletal: Negative.   Skin: Negative.   Allergic/Immunologic: Negative.   Neurological: Negative.   Psychiatric/Behavioral: Negative.   All other systems reviewed and are negative.  Past Medical History:  Diagnosis Date  . Anemia   . Chronic kidney disease   . Diabetes mellitus   . Gout   . Hyperlipidemia   . Hypertension     Social History   Social History  . Marital status: Married    Spouse name: N/A  . Number of children: N/A  . Years of  education: N/A   Occupational History  . Not on file.   Social History Main Topics  . Smoking status: Never Smoker  . Smokeless tobacco: Not on file  . Alcohol use No  . Drug use: No  . Sexual activity: Not on file   Other Topics Concern  . Not on file   Social History Narrative   Retired - She Forensic psychologistsold insurance   Married for 51 years    Three children - all in [place]      She likes to quilting and shopping.     Past Surgical History:  Procedure Laterality Date  . APPENDECTOMY      Family History  Problem Relation Age of Onset  . Cancer Mother     colon  . Cancer Father     colon  . Hypertension Mother     No Known Allergies  Current Outpatient Prescriptions on File Prior to Visit  Medication Sig Dispense Refill  . allopurinol (ZYLOPRIM) 300 MG tablet Take 1 tablet (300 mg total) by mouth daily. 100 tablet 3  . cyanocobalamin (,VITAMIN B-12,) 1000 MCG/ML injection 1 cc monthly 30 mL 1  . glipiZIDE (GLUCOTROL) 10 MG tablet Take 1 tablet (10 mg total) by mouth 2 (two) times daily before a meal. (Patient taking differently: Take 15 mg by mouth 2 (two) times daily before a meal. ) 200 tablet 3  . glucose blood (BAYER CONTOUR TEST) test strip 1 each by Other route daily. Dx 250.01 100 each 12  .  insulin lispro protamine-lispro (HUMALOG 75/25 MIX) (75-25) 100 UNIT/ML SUSP injection Inject 10 Units into the skin daily with breakfast. 10 mL 12  . Insulin Syringe-Needle U-100 31G X 5/16" 0.5 ML MISC Use daily dx e11.9 100 each 3  . lisinopril (PRINIVIL,ZESTRIL) 40 MG tablet One and half tabs daily 150 tablet 3  . metFORMIN (GLUCOPHAGE) 1000 MG tablet Take 1 tablet (1,000 mg total) by mouth 2 (two) times daily with a meal. 200 tablet 3  . simvastatin (ZOCOR) 20 MG tablet Take 1 tablet (20 mg total) by mouth at bedtime. 100 tablet 3   No current facility-administered medications on file prior to visit.     BP 124/70   Temp 97.5 F (36.4 C) (Oral)   Ht 5\' 4"  (1.626 m)   Wt 208  lb 9.6 oz (94.6 kg)   BMI 35.81 kg/m       Objective:   Physical Exam  Constitutional: She is oriented to person, place, and time. She appears well-developed and well-nourished. No distress.  Obese   HENT:  Head: Normocephalic and atraumatic.  Right Ear: External ear normal.  Left Ear: External ear normal.  Nose: Nose normal.  Mouth/Throat: Oropharynx is clear and moist. No oropharyngeal exudate.  Eyes: Conjunctivae are normal. Right eye exhibits no discharge. Left eye exhibits no discharge.  Neck: Normal range of motion. Neck supple. Carotid bruit is not present. No tracheal deviation present. No thyroid mass and no thyromegaly present.  Cardiovascular: Normal rate, regular rhythm, normal heart sounds and intact distal pulses.  Exam reveals no gallop and no friction rub.   No murmur heard. Pulmonary/Chest: Effort normal and breath sounds normal. No stridor. No respiratory distress. She has no wheezes. She has no rales. She exhibits no tenderness.  Abdominal: Soft. Bowel sounds are normal. She exhibits no distension and no mass. There is no tenderness. There is no rebound and no guarding.  Musculoskeletal: Normal range of motion.  Lymphadenopathy:    She has no cervical adenopathy.  Neurological: She is alert and oriented to person, place, and time. No cranial nerve deficit. Coordination normal.  Skin: Skin is warm and dry. No rash noted. She is not diaphoretic. No erythema. No pallor.  Psychiatric: She has a normal mood and affect. Her behavior is normal. Judgment and thought content normal.  Nursing note and vitals reviewed.     Assessment & Plan:  1. Routine general medical examination at a health care facility - Reviewed labs in detail with patient and all questions answered - I would like her to work on increasing exercising and cutting back on sugars.  - Follow up in one year for CPE  2. Essential hypertension - At goal. No change in medication   3. Diabetes 1.5,  managed as type 1 (HCC) - A1c has stayed steady at 6.7  - No change in medications  - work on diet and exercise - Follow up in 6 months   4. Hyperlipidemia, unspecified hyperlipidemia type - Controlled on statin  - No change at this time   [name], NP

## 2016-12-08 ENCOUNTER — Other Ambulatory Visit: Payer: Self-pay | Admitting: Family Medicine

## 2016-12-08 DIAGNOSIS — E139 Other specified diabetes mellitus without complications: Secondary | ICD-10-CM

## 2017-01-05 ENCOUNTER — Other Ambulatory Visit: Payer: Self-pay | Admitting: Family Medicine

## 2017-01-05 DIAGNOSIS — E7801 Familial hypercholesterolemia: Secondary | ICD-10-CM

## 2017-01-05 DIAGNOSIS — M10079 Idiopathic gout, unspecified ankle and foot: Secondary | ICD-10-CM

## 2017-01-06 ENCOUNTER — Other Ambulatory Visit: Payer: Self-pay | Admitting: Family Medicine

## 2017-01-06 DIAGNOSIS — M10079 Idiopathic gout, unspecified ankle and foot: Secondary | ICD-10-CM

## 2017-01-06 DIAGNOSIS — E7801 Familial hypercholesterolemia: Secondary | ICD-10-CM

## 2017-01-06 NOTE — Telephone Encounter (Signed)
Ok to refill for one year  

## 2017-01-26 ENCOUNTER — Other Ambulatory Visit: Payer: Self-pay | Admitting: Family Medicine

## 2017-01-26 DIAGNOSIS — I10 Essential (primary) hypertension: Secondary | ICD-10-CM

## 2017-03-02 DIAGNOSIS — H524 Presbyopia: Secondary | ICD-10-CM | POA: Diagnosis not present

## 2017-03-02 LAB — HM DIABETES EYE EXAM

## 2017-03-03 ENCOUNTER — Encounter: Payer: Self-pay | Admitting: Family Medicine

## 2017-03-03 ENCOUNTER — Other Ambulatory Visit: Payer: Self-pay | Admitting: Family Medicine

## 2017-03-03 DIAGNOSIS — E139 Other specified diabetes mellitus without complications: Secondary | ICD-10-CM

## 2017-04-06 ENCOUNTER — Other Ambulatory Visit: Payer: Self-pay | Admitting: Adult Health

## 2017-04-06 DIAGNOSIS — E7801 Familial hypercholesterolemia: Secondary | ICD-10-CM

## 2017-04-06 DIAGNOSIS — M10079 Idiopathic gout, unspecified ankle and foot: Secondary | ICD-10-CM

## 2017-04-07 NOTE — Telephone Encounter (Signed)
Ok to refill Allopurinol?

## 2017-04-07 NOTE — Telephone Encounter (Signed)
Ok to refill allopurinol for one year

## 2017-04-14 ENCOUNTER — Encounter (HOSPITAL_COMMUNITY): Payer: Self-pay

## 2017-04-14 DIAGNOSIS — N182 Chronic kidney disease, stage 2 (mild): Secondary | ICD-10-CM | POA: Diagnosis not present

## 2017-04-14 DIAGNOSIS — I959 Hypotension, unspecified: Secondary | ICD-10-CM | POA: Diagnosis not present

## 2017-04-14 DIAGNOSIS — N179 Acute kidney failure, unspecified: Principal | ICD-10-CM | POA: Diagnosis present

## 2017-04-14 DIAGNOSIS — D631 Anemia in chronic kidney disease: Secondary | ICD-10-CM | POA: Diagnosis not present

## 2017-04-14 DIAGNOSIS — E1122 Type 2 diabetes mellitus with diabetic chronic kidney disease: Secondary | ICD-10-CM | POA: Diagnosis present

## 2017-04-14 DIAGNOSIS — E109 Type 1 diabetes mellitus without complications: Secondary | ICD-10-CM | POA: Diagnosis not present

## 2017-04-14 DIAGNOSIS — Z6833 Body mass index (BMI) 33.0-33.9, adult: Secondary | ICD-10-CM | POA: Diagnosis not present

## 2017-04-14 DIAGNOSIS — E0590 Thyrotoxicosis, unspecified without thyrotoxic crisis or storm: Secondary | ICD-10-CM | POA: Diagnosis not present

## 2017-04-14 DIAGNOSIS — R41841 Cognitive communication deficit: Secondary | ICD-10-CM | POA: Diagnosis not present

## 2017-04-14 DIAGNOSIS — I480 Paroxysmal atrial fibrillation: Secondary | ICD-10-CM | POA: Diagnosis present

## 2017-04-14 DIAGNOSIS — E860 Dehydration: Secondary | ICD-10-CM | POA: Diagnosis present

## 2017-04-14 DIAGNOSIS — I129 Hypertensive chronic kidney disease with stage 1 through stage 4 chronic kidney disease, or unspecified chronic kidney disease: Secondary | ICD-10-CM | POA: Diagnosis present

## 2017-04-14 DIAGNOSIS — R531 Weakness: Secondary | ICD-10-CM | POA: Diagnosis not present

## 2017-04-14 DIAGNOSIS — Z79899 Other long term (current) drug therapy: Secondary | ICD-10-CM | POA: Diagnosis not present

## 2017-04-14 DIAGNOSIS — E785 Hyperlipidemia, unspecified: Secondary | ICD-10-CM | POA: Diagnosis present

## 2017-04-14 DIAGNOSIS — E669 Obesity, unspecified: Secondary | ICD-10-CM | POA: Diagnosis present

## 2017-04-14 DIAGNOSIS — E878 Other disorders of electrolyte and fluid balance, not elsewhere classified: Secondary | ICD-10-CM | POA: Diagnosis present

## 2017-04-14 DIAGNOSIS — Z8249 Family history of ischemic heart disease and other diseases of the circulatory system: Secondary | ICD-10-CM | POA: Diagnosis not present

## 2017-04-14 DIAGNOSIS — E11649 Type 2 diabetes mellitus with hypoglycemia without coma: Secondary | ICD-10-CM | POA: Diagnosis not present

## 2017-04-14 DIAGNOSIS — I10 Essential (primary) hypertension: Secondary | ICD-10-CM | POA: Diagnosis not present

## 2017-04-14 DIAGNOSIS — N390 Urinary tract infection, site not specified: Secondary | ICD-10-CM | POA: Diagnosis present

## 2017-04-14 DIAGNOSIS — R278 Other lack of coordination: Secondary | ICD-10-CM | POA: Diagnosis not present

## 2017-04-14 DIAGNOSIS — M109 Gout, unspecified: Secondary | ICD-10-CM | POA: Diagnosis not present

## 2017-04-14 DIAGNOSIS — Z794 Long term (current) use of insulin: Secondary | ICD-10-CM

## 2017-04-14 DIAGNOSIS — I4891 Unspecified atrial fibrillation: Secondary | ICD-10-CM | POA: Diagnosis present

## 2017-04-14 DIAGNOSIS — Z7984 Long term (current) use of oral hypoglycemic drugs: Secondary | ICD-10-CM

## 2017-04-14 DIAGNOSIS — R110 Nausea: Secondary | ICD-10-CM | POA: Diagnosis not present

## 2017-04-14 DIAGNOSIS — D510 Vitamin B12 deficiency anemia due to intrinsic factor deficiency: Secondary | ICD-10-CM | POA: Diagnosis not present

## 2017-04-14 DIAGNOSIS — E162 Hypoglycemia, unspecified: Secondary | ICD-10-CM | POA: Diagnosis present

## 2017-04-14 DIAGNOSIS — Z9049 Acquired absence of other specified parts of digestive tract: Secondary | ICD-10-CM

## 2017-04-14 DIAGNOSIS — M6281 Muscle weakness (generalized): Secondary | ICD-10-CM | POA: Diagnosis not present

## 2017-04-14 DIAGNOSIS — R2681 Unsteadiness on feet: Secondary | ICD-10-CM | POA: Diagnosis not present

## 2017-04-14 LAB — CBG MONITORING, ED
GLUCOSE-CAPILLARY: 58 mg/dL — AB (ref 65–99)
Glucose-Capillary: 120 mg/dL — ABNORMAL HIGH (ref 65–99)

## 2017-04-14 NOTE — ED Triage Notes (Signed)
Pt brought in by family due to concerns of hypoglycemia. Her son reports pt was confused today. He states she has not eaten in 3 days and has not taken any of her medications. Pt is A&OX4 answered all questions appropriately. CBG at triage 58. Pt to be given orange juice and peanut butter crackers per hypoglycemic order set/protocols.

## 2017-04-15 ENCOUNTER — Inpatient Hospital Stay (HOSPITAL_COMMUNITY)
Admission: EM | Admit: 2017-04-15 | Discharge: 2017-04-17 | DRG: 683 | Disposition: A | Discharge status: Skilled Nursing Facility | Payer: Medicare Other | Attending: Internal Medicine | Admitting: Internal Medicine

## 2017-04-15 ENCOUNTER — Other Ambulatory Visit (HOSPITAL_COMMUNITY): Payer: Medicare Other

## 2017-04-15 ENCOUNTER — Encounter (HOSPITAL_COMMUNITY): Payer: Self-pay | Admitting: Internal Medicine

## 2017-04-15 ENCOUNTER — Observation Stay (HOSPITAL_COMMUNITY): Payer: Medicare Other

## 2017-04-15 ENCOUNTER — Emergency Department (HOSPITAL_COMMUNITY): Payer: Medicare Other

## 2017-04-15 DIAGNOSIS — N189 Chronic kidney disease, unspecified: Secondary | ICD-10-CM | POA: Diagnosis present

## 2017-04-15 DIAGNOSIS — I10 Essential (primary) hypertension: Secondary | ICD-10-CM | POA: Diagnosis present

## 2017-04-15 DIAGNOSIS — N179 Acute kidney failure, unspecified: Secondary | ICD-10-CM | POA: Diagnosis not present

## 2017-04-15 DIAGNOSIS — R531 Weakness: Secondary | ICD-10-CM | POA: Diagnosis not present

## 2017-04-15 DIAGNOSIS — N183 Chronic kidney disease, stage 3 (moderate): Secondary | ICD-10-CM

## 2017-04-15 DIAGNOSIS — N390 Urinary tract infection, site not specified: Secondary | ICD-10-CM | POA: Diagnosis not present

## 2017-04-15 DIAGNOSIS — E0590 Thyrotoxicosis, unspecified without thyrotoxic crisis or storm: Secondary | ICD-10-CM

## 2017-04-15 DIAGNOSIS — R110 Nausea: Secondary | ICD-10-CM

## 2017-04-15 DIAGNOSIS — E11649 Type 2 diabetes mellitus with hypoglycemia without coma: Secondary | ICD-10-CM | POA: Diagnosis not present

## 2017-04-15 DIAGNOSIS — D649 Anemia, unspecified: Secondary | ICD-10-CM | POA: Diagnosis present

## 2017-04-15 DIAGNOSIS — E109 Type 1 diabetes mellitus without complications: Secondary | ICD-10-CM

## 2017-04-15 DIAGNOSIS — E139 Other specified diabetes mellitus without complications: Secondary | ICD-10-CM | POA: Diagnosis present

## 2017-04-15 DIAGNOSIS — E1122 Type 2 diabetes mellitus with diabetic chronic kidney disease: Secondary | ICD-10-CM | POA: Diagnosis not present

## 2017-04-15 LAB — BASIC METABOLIC PANEL
ANION GAP: 15 (ref 5–15)
ANION GAP: 8 (ref 5–15)
BUN: 147 mg/dL — ABNORMAL HIGH (ref 6–20)
BUN: 89 mg/dL — ABNORMAL HIGH (ref 6–20)
CALCIUM: 9.2 mg/dL (ref 8.9–10.3)
CHLORIDE: 116 mmol/L — AB (ref 101–111)
CO2: 17 mmol/L — AB (ref 22–32)
CO2: 18 mmol/L — ABNORMAL LOW (ref 22–32)
CREATININE: 2.07 mg/dL — AB (ref 0.44–1.00)
Calcium: 8.7 mg/dL — ABNORMAL LOW (ref 8.9–10.3)
Chloride: 102 mmol/L (ref 101–111)
Creatinine, Ser: 3.25 mg/dL — ABNORMAL HIGH (ref 0.44–1.00)
GFR calc non Af Amer: 23 mL/min — ABNORMAL LOW (ref >60)
GFR, EST AFRICAN AMERICAN: 15 mL/min — AB (ref >60)
GFR, EST AFRICAN AMERICAN: 26 mL/min — AB (ref >60)
GFR, EST NON AFRICAN AMERICAN: 13 mL/min — AB (ref >60)
GLUCOSE: 291 mg/dL — AB (ref 65–99)
Glucose, Bld: 146 mg/dL — ABNORMAL HIGH (ref 65–99)
POTASSIUM: 4.4 mmol/L (ref 3.5–5.1)
Potassium: 4.2 mmol/L (ref 3.5–5.1)
SODIUM: 142 mmol/L (ref 135–145)
Sodium: 134 mmol/L — ABNORMAL LOW (ref 135–145)

## 2017-04-15 LAB — IRON AND TIBC
IRON: 37 ug/dL (ref 28–170)
SATURATION RATIOS: 15 % (ref 10.4–31.8)
TIBC: 246 ug/dL — AB (ref 250–450)
UIBC: 209 ug/dL

## 2017-04-15 LAB — CBC
HEMATOCRIT: 32.1 % — AB (ref 36.0–46.0)
HEMOGLOBIN: 10.5 g/dL — AB (ref 12.0–15.0)
MCH: 27.1 pg (ref 26.0–34.0)
MCHC: 32.7 g/dL (ref 30.0–36.0)
MCV: 82.9 fL (ref 78.0–100.0)
Platelets: 178 10*3/uL (ref 150–400)
RBC: 3.87 MIL/uL (ref 3.87–5.11)
RDW: 15.7 % — ABNORMAL HIGH (ref 11.5–15.5)
WBC: 10.5 10*3/uL (ref 4.0–10.5)

## 2017-04-15 LAB — GLUCOSE, CAPILLARY
Glucose-Capillary: 134 mg/dL — ABNORMAL HIGH (ref 65–99)
Glucose-Capillary: 166 mg/dL — ABNORMAL HIGH (ref 65–99)
Glucose-Capillary: 180 mg/dL — ABNORMAL HIGH (ref 65–99)
Glucose-Capillary: 207 mg/dL — ABNORMAL HIGH (ref 65–99)
Glucose-Capillary: 216 mg/dL — ABNORMAL HIGH (ref 65–99)

## 2017-04-15 LAB — COMPREHENSIVE METABOLIC PANEL
ALBUMIN: 3.1 g/dL — AB (ref 3.5–5.0)
ALK PHOS: 58 U/L (ref 38–126)
ALT: 8 U/L — AB (ref 14–54)
AST: 11 U/L — AB (ref 15–41)
Anion gap: 10 (ref 5–15)
BILIRUBIN TOTAL: 0.5 mg/dL (ref 0.3–1.2)
BUN: 120 mg/dL — AB (ref 6–20)
CALCIUM: 8.4 mg/dL — AB (ref 8.9–10.3)
CO2: 16 mmol/L — ABNORMAL LOW (ref 22–32)
CREATININE: 2.49 mg/dL — AB (ref 0.44–1.00)
Chloride: 111 mmol/L (ref 101–111)
GFR calc Af Amer: 21 mL/min — ABNORMAL LOW (ref >60)
GFR, EST NON AFRICAN AMERICAN: 18 mL/min — AB (ref >60)
GLUCOSE: 206 mg/dL — AB (ref 65–99)
Potassium: 4.3 mmol/L (ref 3.5–5.1)
Sodium: 137 mmol/L (ref 135–145)
TOTAL PROTEIN: 5.7 g/dL — AB (ref 6.5–8.1)

## 2017-04-15 LAB — I-STAT TROPONIN, ED: Troponin i, poc: 0.06 ng/mL (ref 0.00–0.08)

## 2017-04-15 LAB — CBC WITH DIFFERENTIAL/PLATELET
BASOS PCT: 0 %
Basophils Absolute: 0 10*3/uL (ref 0.0–0.1)
Eosinophils Absolute: 0.1 10*3/uL (ref 0.0–0.7)
Eosinophils Relative: 0 %
HEMATOCRIT: 34.1 % — AB (ref 36.0–46.0)
HEMOGLOBIN: 11.4 g/dL — AB (ref 12.0–15.0)
LYMPHS PCT: 11 %
Lymphs Abs: 1.2 10*3/uL (ref 0.7–4.0)
MCH: 27.9 pg (ref 26.0–34.0)
MCHC: 33.4 g/dL (ref 30.0–36.0)
MCV: 83.4 fL (ref 78.0–100.0)
Monocytes Absolute: 0.8 10*3/uL (ref 0.1–1.0)
Monocytes Relative: 7 %
NEUTROS ABS: 9.3 10*3/uL — AB (ref 1.7–7.7)
NEUTROS PCT: 82 %
Platelets: 204 10*3/uL (ref 150–400)
RBC: 4.09 MIL/uL (ref 3.87–5.11)
RDW: 15.7 % — ABNORMAL HIGH (ref 11.5–15.5)
WBC: 11.4 10*3/uL — ABNORMAL HIGH (ref 4.0–10.5)

## 2017-04-15 LAB — PHOSPHORUS
PHOSPHORUS: 2.9 mg/dL (ref 2.5–4.6)
Phosphorus: 3.8 mg/dL (ref 2.5–4.6)

## 2017-04-15 LAB — VITAMIN B12: Vitamin B-12: 453 pg/mL (ref 180–914)

## 2017-04-15 LAB — CK: Total CK: 30 U/L — ABNORMAL LOW (ref 38–234)

## 2017-04-15 LAB — CREATININE, SERUM
Creatinine, Ser: 2.85 mg/dL — ABNORMAL HIGH (ref 0.44–1.00)
GFR, EST AFRICAN AMERICAN: 18 mL/min — AB (ref >60)
GFR, EST NON AFRICAN AMERICAN: 15 mL/min — AB (ref >60)

## 2017-04-15 LAB — FOLATE: FOLATE: 11.9 ng/mL (ref >5.9)

## 2017-04-15 LAB — RETICULOCYTES
RBC.: 3.85 MIL/uL — AB (ref 3.87–5.11)
Retic Count, Absolute: 61.6 10*3/uL (ref 19.0–186.0)
Retic Ct Pct: 1.6 % (ref 0.4–3.1)

## 2017-04-15 LAB — MAGNESIUM
MAGNESIUM: 2.2 mg/dL (ref 1.7–2.4)
MAGNESIUM: 2 mg/dL (ref 1.7–2.4)

## 2017-04-15 LAB — CBG MONITORING, ED
GLUCOSE-CAPILLARY: 226 mg/dL — AB (ref 65–99)
GLUCOSE-CAPILLARY: 302 mg/dL — AB (ref 65–99)
GLUCOSE-CAPILLARY: 312 mg/dL — AB (ref 65–99)
GLUCOSE-CAPILLARY: 317 mg/dL — AB (ref 65–99)

## 2017-04-15 LAB — TSH: TSH: 0.203 u[IU]/mL — ABNORMAL LOW (ref 0.350–4.500)

## 2017-04-15 LAB — FERRITIN: Ferritin: 197 ng/mL (ref 11–307)

## 2017-04-15 MED ORDER — ONDANSETRON HCL 4 MG/2ML IJ SOLN
4.0000 mg | Freq: Once | INTRAMUSCULAR | Status: AC
  Administered 2017-04-15: 4 mg via INTRAVENOUS
  Filled 2017-04-15: qty 2

## 2017-04-15 MED ORDER — HEPARIN BOLUS VIA INFUSION
3500.0000 [IU] | Freq: Once | INTRAVENOUS | Status: DC
  Filled 2017-04-15: qty 3500

## 2017-04-15 MED ORDER — HEPARIN (PORCINE) IN NACL 100-0.45 UNIT/ML-% IJ SOLN
1150.0000 [IU]/h | INTRAMUSCULAR | Status: DC
  Administered 2017-04-15 – 2017-04-17 (×2): 1150 [IU]/h via INTRAVENOUS
  Filled 2017-04-15 (×2): qty 250

## 2017-04-15 MED ORDER — ACETAMINOPHEN 325 MG PO TABS: 650.0000 mg | ORAL_TABLET | Freq: Four times a day (QID) | ORAL | Status: DC | PRN

## 2017-04-15 MED ORDER — HEPARIN BOLUS VIA INFUSION
2000.0000 [IU] | Freq: Once | INTRAVENOUS | Status: AC
  Administered 2017-04-15: 2000 [IU] via INTRAVENOUS
  Filled 2017-04-15: qty 2000

## 2017-04-15 MED ORDER — SODIUM CHLORIDE 0.9 % IV BOLUS (SEPSIS)
1000.0000 mL | Freq: Once | INTRAVENOUS | Status: AC
  Administered 2017-04-15: 1000 mL via INTRAVENOUS

## 2017-04-15 MED ORDER — HYDRALAZINE HCL 20 MG/ML IJ SOLN: 10.0000 mg | INTRAMUSCULAR | Status: DC | PRN

## 2017-04-15 MED ORDER — SODIUM CHLORIDE 0.9 % IV SOLN
INTRAVENOUS | Status: AC
  Administered 2017-04-15 (×2): via INTRAVENOUS

## 2017-04-15 MED ORDER — ACETAMINOPHEN 650 MG RE SUPP: 650.0000 mg | Freq: Four times a day (QID) | RECTAL | Status: DC | PRN

## 2017-04-15 MED ORDER — HEPARIN SODIUM (PORCINE) 5000 UNIT/ML IJ SOLN
5000.0000 [IU] | Freq: Three times a day (TID) | INTRAMUSCULAR | Status: DC
  Administered 2017-04-15 (×2): 5000 [IU] via SUBCUTANEOUS
  Filled 2017-04-15: qty 1

## 2017-04-15 NOTE — Progress Notes (Signed)
ER RN called and gave report. Room ready.  

## 2017-04-15 NOTE — Evaluation (Signed)
Physical Therapy Evaluation Patient Details Name: Sherri Pratt MRN: [id number] DOB: 1946/06/07 Today's Date: 04/15/2017   History of Present Illness  pt is a 71 y/o female with h/o dm2, HTN, CKD, admitted to ED  increasingly weak and less able to walk  over 2 week period.  She also had decreased oral intake over the same period.  She is admitted for acute renal failure likely from dehydration.  Clinical Impression  Pt admitted with/for significant weakness and found to be in Acute kidney failure, suspected due to dehydration.  Pt needing mod/max assist for basic mobility at this time..  Pt currently limited functionally due to the problems listed. ( See problems list.)   Pt will benefit from PT to maximize function and safety in order to get ready for next venue listed below.     Follow Up Recommendations SNF;Supervision/Assistance - 24 hour    Equipment Recommendations  Rolling walker with 5" wheels    Recommendations for Other Services       Precautions / Restrictions Precautions Precautions: Fall      Mobility  Bed Mobility Overal bed mobility: Needs Assistance Bed Mobility: Supine to Sit;Sit to Sidelying     Supine to sit: Max assist   Sit to sidelying: Mod assist General bed mobility comments: pt slow to initiate, needed cues to keep on task.  She could not seem to generate any momentum, but did at times demo techniques that would normally be used for symmetrical scoot.  pt needed max assist ultimately for the scoot.  Transfers Overall transfer level: Needs assistance Equipment used: 1 person hand held assist Transfers: Sit to/from Stand Sit to Stand: Mod assist         General transfer comment: assist to boost up more than forward.  pt had difficulty side stepping up 1 foot toward HOB before sit down.  Ambulation/Gait             General Gait Details: NT,  pt needing significant assist to stand and side step.  Gait aborted today.  Stairs             Wheelchair Mobility    Modified Rankin (Stroke Patients Only)       Balance Overall balance assessment: Needs assistance   Sitting balance-Leahy Scale: Fair Sitting balance - Comments: unable to w/shift well or come significantly forward outside BOS to unweight hips.  Tended to list Left in general.  Managed to sit without UE assist for short periods.     Standing balance-Leahy Scale: Poor Standing balance comment: needed supportive assist.                             Pertinent Vitals/Pain Pain Assessment: Faces Faces Pain Scale: Hurts a little bit Pain Location: vague to MMT  ?Knees Pain Descriptors / Indicators: Grimacing;Guarding Pain Intervention(s): Monitored during session;Limited activity within patient's tolerance    Home Living Family/patient expects to be discharged to:: Private residence Living Arrangements: Spouse/significant other Available Help at Discharge: Family;Available 24 hours/day Type of Home: House Home Access: Stairs to enter Entrance Stairs-Rails: None (post or uses husband) Secretary/administrator of Steps: 2 Home Layout: One level Home Equipment: None      Prior Function Level of Independence: Needs assistance   Gait / Transfers Assistance Needed: independent up until last 2 weaks and then needing significant amounts of assist  ADL's / Homemaking Assistance Needed: has been independent with ADL's up until last  2 weeks per pt/husband        Hand Dominance        Extremity/Trunk Assessment   Upper Extremity Assessment Upper Extremity Assessment: Generalized weakness    Lower Extremity Assessment Lower Extremity Assessment: Generalized weakness    Cervical / Trunk Assessment Cervical / Trunk Assessment: Kyphotic  Communication   Communication: No difficulties  Cognition Arousal/Alertness: Awake/alert Behavior During Therapy: Flat affect Overall Cognitive Status: Within Functional Limits for tasks assessed (slow  to process, looking to husb. who occ. laughed)                                        General Comments      Exercises     Assessment/Plan    PT Assessment Patient needs continued PT services  PT Problem List Decreased strength;Decreased activity tolerance;Decreased balance;Decreased mobility;Decreased coordination;Decreased knowledge of use of DME       PT Treatment Interventions Gait training;DME instruction;Functional mobility training;Therapeutic activities;Balance training;Patient/family education;Therapeutic exercise;Stair training    PT Goals (Current goals can be found in the Care Plan section)  Acute Rehab PT Goals Patient Stated Goal: I want to go home now PT Goal Formulation: With patient Time For Goal Achievement: 04/29/17 Potential to Achieve Goals: Fair    Frequency Min 3X/week   Barriers to discharge        Co-evaluation               AM-PAC PT "6 Clicks" Daily Activity  Outcome Measure Difficulty turning over in bed (including adjusting bedclothes, sheets and blankets)?: Total Difficulty moving from lying on back to sitting on the side of the bed? : Total Difficulty sitting down on and standing up from a chair with arms (e.g., wheelchair, bedside commode, etc,.)?: Total Help needed moving to and from a bed to chair (including a wheelchair)?: A Lot Help needed walking in hospital room?: A Lot Help needed climbing 3-5 steps with a railing? : Total 6 Click Score: 8    End of Session   Activity Tolerance: Patient limited by fatigue;Patient tolerated treatment well Patient left: in bed;with call bell/phone within reach;with family/visitor present Nurse Communication: Mobility status PT Visit Diagnosis: Unsteadiness on feet (R26.81);Muscle weakness (generalized) (M62.81);Other abnormalities of gait and mobility (R26.89)    Time: 1655-1730 PT Time Calculation (min) (ACUTE ONLY): 35 min   Charges:   PT Evaluation $PT Eval Moderate  Complexity: 1 Procedure PT Treatments $Therapeutic Activity: 8-22 mins   PT G Codes:   PT G-Codes **NOT FOR INPATIENT CLASS** Functional Assessment Tool Used: AM-PAC 6 Clicks Basic Mobility;Clinical judgement Functional Limitation: Mobility: Walking and moving around Mobility: Walking and Moving Around Current Status ([id number](G8978): At least 20 percent but less than 40 percent impaired, limited or restricted Mobility: Walking and Moving Around Goal Status [phone](G8979): At least 1 percent but less than 20 percent impaired, limited or restricted    04/15/2017  [place] BingKen Ivett Luebbe, PT [phone]  (pager)  Eliseo GumKenneth V Zula Hovsepian 04/15/2017, 5:50 PM

## 2017-04-15 NOTE — Progress Notes (Addendum)
ANTICOAGULATION CONSULT NOTE - Initial Consult  Pharmacy Consult:  Heparin Indication: atrial fibrillation  No Known Allergies  Patient Measurements: Height: 5' 5.5" (166.4 cm) Weight: 205 lb 4.8 oz (93.1 kg) IBW/kg (Calculated) : 58.15 Heparin Dosing Weight: 79 kg  Vital Signs: Temp: 98.3 F (36.8 C) (07/04 2048) Temp Source: Oral (07/04 2048) BP: 100/44 (07/04 2048) Pulse Rate: 64 (07/04 2048)  Labs:  Recent Labs  04/15/17 0300 04/15/17 0542 04/15/17 1031  HGB 11.4* 10.5*  --   HCT 34.1* 32.1*  --   PLT 204 178  --   CREATININE 3.25* 2.85* 2.49*  CKTOTAL  --   --  30*    Estimated Creatinine Clearance: 23.3 mL/min (A) (by C-G formula based on SCr of 2.49 mg/dL (H)).   Medical History: Past Medical History:  Diagnosis Date  . Anemia   . Chronic kidney disease   . Diabetes mellitus   . Gout   . Hyperlipidemia   . Hypertension       Assessment: [age] YOF with new-onset Afib RVR that converted to NSR.  Given high CHADsVASc score, Pharmacy consulted to initiate IV heparin.  Baseline labs and home meds reviewed.  Noted that patient received heparin SQ around 1500 today.   Goal of Therapy:  Heparin level 0.3-0.7 units/ml Monitor platelets by anticoagulation protocol: Yes    Plan:  - D/C heparin SQ - Heparin 2000 units IV bolus (reduced dose) - Heparin gtt at 1150 units/hr - Check 8 hr heparin level - Daily heparin level and CBC   Blayke Pinera D. Laney Potashang, PharmD, BCPS Pager:  [phone] 04/15/2017, 9:22 PM

## 2017-04-15 NOTE — ED Notes (Signed)
Pt in XR. Introduced self to family in room.

## 2017-04-15 NOTE — ED Provider Notes (Signed)
[facility]-EMERGENCY DEPT Provider Note   CSN: [id number] Arrival date & time: 04/14/17  2024  By signing my name below, I, Rosana Fret, attest that this documentation has been prepared under the direction and in the presence of Geoffery Lyons, MD. Electronically Signed: Rosana Fret, ED Scribe. 04/15/17. 2:13 AM.  History   Chief Complaint Chief Complaint  Patient presents with  . Hypoglycemia   HPI Comments: Sherri Pratt is a 71 y.o. female who presents to the Emergency Department complaining of gradual onset hypoglycemia onset 3 days ago. Pt states she has not taken any of her medications in 3 days. Pt's family is concerned and states she has been confused lately.    The history is provided by the patient. No language interpreter was used.  Hypoglycemia  Initial blood sugar:  58 Blood sugar after intervention:  317 Severity:  Mild Onset quality:  Gradual Duration:  3 days Timing:  Constant Chronicity:  New Diabetic status:  Controlled with oral medications and controlled with insulin Time since last antidiabetic medication:  3 days Context: diet changes   Relieved by:  Eating Ineffective treatments: didn't take her medications. Associated symptoms: altered mental status     Past Medical History:  Diagnosis Date  . Anemia   . Chronic kidney disease   . Diabetes mellitus   . Gout   . Hyperlipidemia   . Hypertension     Patient Active Problem List   Diagnosis Date Noted  . Anemia 11/08/2012  . GOUT 11/12/2010  . Chronic kidney disease 03/27/2009  . Diabetes 1.5, managed as type 1 (HCC) 09/21/2007  . Hyperlipidemia 09/21/2007  . Essential hypertension 09/21/2007    Past Surgical History:  Procedure Laterality Date  . APPENDECTOMY      OB History    No data available       Home Medications    Prior to Admission medications   Medication Sig Start Date End Date Taking? Authorizing Provider  Alcohol Swabs (B-D SINGLE USE SWABS REGULAR) PADS USE AS  DIRECTED 12/10/16   [name], Kandee Keen, NP  allopurinol (ZYLOPRIM) 300 MG tablet TAKE 1 TABLET BY MOUTH ONCE A DAY 04/07/17   [name], Kandee Keen, NP  cyanocobalamin (,VITAMIN B-12,) 1000 MCG/ML injection 1 cc monthly 11/21/15   Roderick Pee, MD  glipiZIDE (GLUCOTROL) 10 MG tablet TAKE 1 TABLET BY MOUTH TWICE A DAY BEFORE A MEAL 12/10/16   [name], Kandee Keen, NP  glucose blood (BAYER CONTOUR TEST) test strip 1 each by Other route daily. Dx 250.01 11/09/13   Roderick Pee, MD  insulin lispro protamine-lispro (HUMALOG 75/25 MIX) (75-25) 100 UNIT/ML SUSP injection Inject 10 Units into the skin daily with breakfast. 11/21/15 11/25/17  Roderick Pee, MD  lisinopril (PRINIVIL,ZESTRIL) 40 MG tablet TAKE 1 AND 1/2 TABLETS BY MOUTH DAILY 01/26/17   Roderick Pee, MD  metFORMIN (GLUCOPHAGE) 1000 MG tablet TAKE 1 TABLET BY MOUTH TWICE A DAY WITH A MEAL 03/03/17   Roderick Pee, MD  PRODIGY INSULIN SYRINGE 31G X 5/16" 0.5 ML MISC USE DAILY AS DIRECTED 01/26/17   Roderick Pee, MD  simvastatin (ZOCOR) 20 MG tablet TAKE 1 TABLET BY MOUTH DAILY AT BEDTIME 04/07/17   [name], NP    Family History Family History  Problem Relation Age of Onset  . Cancer Mother        colon  . Hypertension Mother   . Cancer Father        colon    Social History Social  History  Substance Use Topics  . Smoking status: Never Smoker  . Smokeless tobacco: Never Used  . Alcohol use No     Allergies   Patient has no known allergies.   Review of Systems Review of Systems  Constitutional: Negative for fever.  Psychiatric/Behavioral: Positive for confusion.  All other systems reviewed and are negative.    Physical Exam Updated Vital Signs BP (!) 97/56 (BP Location: Right Arm)   Pulse 82   Temp 97.8 F (36.6 C) (Oral)   Resp 12   SpO2 98%   Physical Exam  Constitutional: She is oriented to person, place, and time. She appears well-developed and well-nourished.  HENT:  Head: Normocephalic and atraumatic.  Right  Ear: External ear normal.  Left Ear: External ear normal.  Nose: Nose normal.  Eyes: Right eye exhibits no discharge. Left eye exhibits no discharge.  Cardiovascular: Normal rate, regular rhythm and normal heart sounds.  Exam reveals no gallop and no friction rub.   No murmur heard. Pulmonary/Chest: Effort normal and breath sounds normal. No respiratory distress. She has no wheezes. She has no rales.  Abdominal: Soft. There is no tenderness.  Neurological: She is alert and oriented to person, place, and time.  Skin: Skin is warm and dry.  Nursing note and vitals reviewed.    ED Treatments / Results  DIAGNOSTIC STUDIES: Oxygen Saturation is 98% on RA, normal by my interpretation.   COORDINATION OF CARE: 2:10 AM-Discussed next steps with pt. Pt verbalized understanding and is agreeable with the plan.   Labs (all labs ordered are listed, but only abnormal results are displayed) Labs Reviewed  CBG MONITORING, ED - Abnormal; Notable for the following:       Result Value   Glucose-Capillary 58 (*)    All other components within normal limits  CBG MONITORING, ED - Abnormal; Notable for the following:    Glucose-Capillary 120 (*)    All other components within normal limits  CBG MONITORING, ED - Abnormal; Notable for the following:    Glucose-Capillary 312 (*)    All other components within normal limits  CBG MONITORING, ED - Abnormal; Notable for the following:    Glucose-Capillary 317 (*)    All other components within normal limits    EKG  EKG Interpretation None       Radiology No results found.  Procedures Procedures (including critical care time)  Medications Ordered in ED Medications - No data to display   Initial Impression / Assessment and Plan / ED Course  I have reviewed the triage vital signs and the nursing notes.  Pertinent labs & imaging results that were available during my care of the patient were reviewed by me and considered in my medical  decision making (see chart for details).  Clinical Course as of Apr 15 209  Wed Apr 15, 2017  0210 Glucose-Capillary: [name] 317 [DW]    Clinical Course User Index [DW] Rosana Fret   Patient brought by family for evaluation of low blood sugar. This evening she became less responsive and sugars drop to the 40s. She was given by mouth nourishment and her sugars have since improved.  In talking to the son, he tells me she has been less steady on her feet for the past week. There air conditioner apparently broken it has been very hot at the house. The patient states that she has not been eating or drinking and has not had much appetite due to the heat. Additional laboratory studies  were obtained including a CBC and basic metabolic panel. This showed acute renal failure with azotemia. Her BUN was 147 with creatinine of 3.25. Both of these are markedly above her baseline.  I've discussed these findings with Dr. Toniann FailKakrakandy from the hospitalist service who agrees to admit.  Final Clinical Impressions(s) / ED Diagnoses   Final diagnoses:  None    New Prescriptions New Prescriptions   No medications on file   I personally performed the services described in this documentation, which was scribed in my presence. The recorded information has been reviewed and is accurate.        Geoffery Lyonselo, Tori Cupps, MD 04/16/17 [id number]

## 2017-04-15 NOTE — Plan of Care (Signed)
Patient was noticed to be tachycardic and the monitor showing atrial fibrillation with RVR. EKG taken also shows A. fib with RVR which converted back to sinus rhythm. I have discussed with Dr.Qureshi, on call Cardiologist. Dr.Qureshi confirmed that patient was in Atrial fibrillation reviewing the EKG. Chads2Vasc score of 4. Heparin ordered and will check TSH 2D ECHO aand troponin.  Sherri Pratt.

## 2017-04-15 NOTE — ED Notes (Signed)
Pt actively vomiting. Zofran ordered. 

## 2017-04-15 NOTE — Progress Notes (Signed)
Patient was admitted early this AM after midnight and H and P has been reviewed and I am in current agreement with the Assessment and Plan done by Dr. Toniann FailKakrakandy. Patient is a a 71 year old female with a PMH of Insulin Dependent Diabetes Mellitus, Gout, HTN, HLD, CKD Stage 2, Anemia and other comorbids who [facility] with hypoglycemia, weakness and not eating well along with Nausea and Vomiting. She was found to have an AKI on CKD Stage 2 with a BUN/Cr of 147/3.25. We will continue IVF Rehydration and decrease the rate to 100 mL/hr. Agree with holding Home Humalog 75-25 at this time and continuing CBG Checks q4h. Patient's Abdominal Ultrasound showed no acute abnormality and cortical thinning in both kidneys and Negative for Hydronephrosis.  We will repeat Blood work and monitor patient's clinical response to interventions.

## 2017-04-15 NOTE — H&P (Signed)
History and Physical    Sherri Pratt WUJ:[id number] DOB: [birth year] DOA: 04/15/2017  PCP: [name], NP  Patient coming from: Home.  Chief Complaint: Weakness.  HPI: Sherri Pratt is a 71 y.o. female with history of diabetes mellitus type 2, hypertension, hyperlipidemia was brought to the ER by patient's son after patient was found to be increasingly weak and unable to ambulate and decreased by mouth intake. Patient states that patient's home after conditioning has not been working for the last couple of weeks. Patient has been feeling dehydrated week and poor appetite. Had one episode of diarrhea 2 weeks ago and one in the ER today. One episode of nausea vomiting. He denies any abdominal pain.   ED Course: Labs in the ER shows creatinine of 3.25 with elevated BUN. Patient was hypoglycemic for which patient was given D50. Patient's last creatinine in January of this year was 1.04. Patient admitted for acute renal failure likely from dehydration. UA is pending. Patient was given fluid bolus.  Review of Systems: As per HPI, rest all negative.   Past Medical History:  Diagnosis Date  . Anemia   . Chronic kidney disease   . Diabetes mellitus   . Gout   . Hyperlipidemia   . Hypertension     Past Surgical History:  Procedure Laterality Date  . APPENDECTOMY       reports that she has never smoked. She has never used smokeless tobacco. She reports that she does not drink alcohol or use drugs.  No Known Allergies  Family History  Problem Relation Age of Onset  . Cancer Mother        colon  . Hypertension Mother   . Cancer Father        colon    Prior to Admission medications   Medication Sig Start Date End Date Taking? Authorizing Provider  allopurinol (ZYLOPRIM) 300 MG tablet TAKE 1 TABLET BY MOUTH ONCE A DAY 04/07/17  Yes [name], Kandee Keen, NP  cyanocobalamin (,VITAMIN B-12,) 1000 MCG/ML injection 1 cc monthly Patient taking differently: Inject 1,000 mcg into the muscle  every 30 (thirty) days.  11/21/15  Yes Roderick Pee, MD  glipiZIDE (GLUCOTROL) 10 MG tablet TAKE 1 TABLET BY MOUTH TWICE A DAY BEFORE A MEAL 12/10/16  Yes [name], Kandee Keen, NP  insulin lispro protamine-lispro (HUMALOG 75/25 MIX) (75-25) 100 UNIT/ML SUSP injection Inject 10 Units into the skin daily with breakfast. 11/21/15 11/25/17 Yes Roderick Pee, MD  lisinopril (PRINIVIL,ZESTRIL) 40 MG tablet TAKE 1 AND 1/2 TABLETS BY MOUTH DAILY 01/26/17  Yes Roderick Pee, MD  metFORMIN (GLUCOPHAGE) 1000 MG tablet TAKE 1 TABLET BY MOUTH TWICE A DAY WITH A MEAL 03/03/17  Yes Roderick Pee, MD  simvastatin (ZOCOR) 20 MG tablet TAKE 1 TABLET BY MOUTH DAILY AT BEDTIME 04/07/17  Yes [name], Kandee Keen, NP  Alcohol Swabs (B-D SINGLE USE SWABS REGULAR) PADS USE AS DIRECTED 12/10/16   [name], Kandee Keen, NP  glucose blood (BAYER CONTOUR TEST) test strip 1 each by Other route daily. Dx 250.01 11/09/13   Roderick Pee, MD  PRODIGY INSULIN SYRINGE 31G X 5/16" 0.5 ML MISC USE DAILY AS DIRECTED 01/26/17   Roderick Pee, MD    Physical Exam: Vitals:   04/15/17 0055 04/15/17 0315 04/15/17 0400 04/15/17 0415  BP: (!) 97/56 (!) 85/40 (!) 93/37 (!) 92/46  Pulse: 82 63 66 73  Resp: 12 14 13 17   Temp: 97.8 F (36.6 C)     TempSrc:  Oral     SpO2: 98% 98% 98% 100%      Constitutional: Moderately built and nourished. Vitals:   04/15/17 0055 04/15/17 0315 04/15/17 0400 04/15/17 0415  BP: (!) 97/56 (!) 85/40 (!) 93/37 (!) 92/46  Pulse: 82 63 66 73  Resp: 12 14 13 17   Temp: 97.8 F (36.6 C)     TempSrc: Oral     SpO2: 98% 98% 98% 100%   Eyes: Anicteric no pallor. ENMT: No discharge from the ears eyes nose and mouth. Neck: No mass felt. No neck rigidity. No JVD appreciated. Respiratory: No rhonchi or crepitations. Cardiovascular: S1-S2 heard no murmurs appreciated. Abdomen: Soft nontender bowel sounds present. Musculoskeletal: No edema. No joint effusion. Skin: No rash. Skin appears warm. Neurologic: Alert awake  oriented to time place and person. Moves all extremities. Psychiatric: Appears normal. Normal affect.   Labs on Admission: I have personally reviewed following labs and imaging studies  CBC:  Recent Labs Lab 04/15/17 0300  WBC 11.4*  NEUTROABS 9.3*  HGB 11.4*  HCT 34.1*  MCV 83.4  PLT 204   Basic Metabolic Panel:  Recent Labs Lab 04/15/17 0300  NA 134*  K 4.4  CL 102  CO2 17*  GLUCOSE 291*  BUN 147*  CREATININE 3.25*  CALCIUM 9.2   GFR: CrCl cannot be calculated (Unknown ideal weight.). Liver Function Tests: No results for input(s): AST, ALT, ALKPHOS, BILITOT, PROT, ALBUMIN in the last 168 hours. No results for input(s): LIPASE, AMYLASE in the last 168 hours. No results for input(s): AMMONIA in the last 168 hours. Coagulation Profile: No results for input(s): INR, PROTIME in the last 168 hours. Cardiac Enzymes: No results for input(s): CKTOTAL, CKMB, CKMBINDEX, TROPONINI in the last 168 hours. BNP (last 3 results) No results for input(s): PROBNP in the last 8760 hours. HbA1C: No results for input(s): HGBA1C in the last 72 hours. CBG:  Recent Labs Lab 04/14/17 2031 04/14/17 2143 04/15/17 0026 04/15/17 0150 04/15/17 0256  GLUCAP 58* 120* 312* 317* 302*   Lipid Profile: No results for input(s): CHOL, HDL, LDLCALC, TRIG, CHOLHDL, LDLDIRECT in the last 72 hours. Thyroid Function Tests: No results for input(s): TSH, T4TOTAL, FREET4, T3FREE, THYROIDAB in the last 72 hours. Anemia Panel: No results for input(s): VITAMINB12, FOLATE, FERRITIN, TIBC, IRON, RETICCTPCT in the last 72 hours. Urine analysis:    Component Value Date/Time   COLORURINE yellow 11/04/2010 0921   APPEARANCEUR Clear 11/04/2010 0921   LABSPEC 1.010 11/04/2010 0921   PHURINE 5.0 11/04/2010 0921   HGBUR negative 11/04/2010 0921   BILIRUBINUR n 11/10/2016 0939   PROTEINUR n 11/10/2016 0939   UROBILINOGEN 0.2 11/10/2016 0939   UROBILINOGEN 0.2 11/04/2010 0921   NITRITE n 11/10/2016  0939   NITRITE negative 11/04/2010 0921   LEUKOCYTESUR Trace (A) 11/10/2016 0939   Sepsis Labs: @LABRCNTIP (procalcitonin:4,lacticidven:4) )No results found for this or any previous visit (from the past 240 hour(s)).   Radiological Exams on Admission: Ct Head Wo Contrast  Result Date: 04/15/2017 CLINICAL DATA:  Acute onset of generalized weakness. Initial encounter. EXAM: CT HEAD WITHOUT CONTRAST TECHNIQUE: Contiguous axial images were obtained from the base of the skull through the vertex without intravenous contrast. COMPARISON:  None. FINDINGS: Brain: No evidence of acute infarction, hemorrhage, hydrocephalus, extra-axial collection or mass lesion/mass effect. Prominence of the ventricles and sulci reflects mild to moderate cortical volume loss. Mild cerebellar atrophy is noted. Mild periventricular white matter change likely reflects small vessel ischemic microangiopathy. The brainstem and fourth ventricle are  within normal limits. The basal ganglia are unremarkable in appearance. The cerebral hemispheres demonstrate grossly normal gray-white differentiation. No mass effect or midline shift is seen. Vascular: No hyperdense vessel or unexpected calcification. Skull: There is no evidence of fracture; visualized osseous structures are unremarkable in appearance. Sinuses/Orbits: The orbits are within normal limits. The paranasal sinuses and mastoid air cells are well-aerated. Other: No significant soft tissue abnormalities are seen. IMPRESSION: 1. No acute intracranial pathology seen on CT. 2. Mild to moderate cortical volume loss and scattered small vessel ischemic microangiopathy. Electronically Signed   By: Roanna RaiderJeffery  Chang M.D.   On: 04/15/2017 03:06    EKG: Independently reviewed. Normal sinus rhythm.  Assessment/Plan Principal Problem:   ARF (acute renal failure) (HCC) Active Problems:   Diabetes 1.5, managed as type 1 (HCC)   Essential hypertension   Chronic kidney disease   Anemia     1. Acute renal failure on chronic kidney disease stage II likely from dehydration and poor oral intake - urine analysis is pending along with FENa. For now patient is receiving the second liter bolus and will continue with hydration. Further plans will be dependent on patient's urine studies and response to fluids. Patient states she does make urine. Abdominal sonogram is pending. 2. Hypertension presently hypotensive so we'll hold off antihypertensives. 3. Diabetes mellitus type 2 presently hypoglycemic - holding off all antidiabetic medications and checking CBGs every 4 hourly for now. 4. Hyperlipidemia on statins. Check CK levels. 5. Normocytic normochromic anemia - check anemia panel.  I have reviewed patient's old charts and labs.   DVT prophylaxis: Heparin.  Code Status: Full code.  Family Communication: Patient's son.  Disposition Plan: Home.  Consults called: None.  Admission status: Observation.    Eduard ClosKAKRAKANDY,Danika Kluender N. MD [facility] Pager [phone].  If 7PM-7AM, please contact night-coverage [email or web address] Password [email or web address]  04/15/2017, 4:20 AM

## 2017-04-15 NOTE — Progress Notes (Signed)
Notified of pt increased pulse. Pulse was in the 170s-200s from 1956-2022. Charge nurse called rapid. MD notified. EKG done.

## 2017-04-15 NOTE — Care Management Note (Signed)
MEDICARE OBSERVATION STATUS NOTIFICATION   Patient Details  Name: Sherri BiddingJudy C Hippler MRN: [id number] Date of Birth: 1946-04-13   Medicare Observation Status Notification Given: yes      Isaias CowmanOliveras-Aizpurua, Suheyb Raucci, RN 04/15/2017, 1:07 PM

## 2017-04-16 ENCOUNTER — Inpatient Hospital Stay (HOSPITAL_COMMUNITY): Payer: Medicare Other

## 2017-04-16 DIAGNOSIS — N182 Chronic kidney disease, stage 2 (mild): Secondary | ICD-10-CM

## 2017-04-16 DIAGNOSIS — D510 Vitamin B12 deficiency anemia due to intrinsic factor deficiency: Secondary | ICD-10-CM | POA: Diagnosis not present

## 2017-04-16 DIAGNOSIS — I4891 Unspecified atrial fibrillation: Secondary | ICD-10-CM | POA: Diagnosis not present

## 2017-04-16 DIAGNOSIS — E109 Type 1 diabetes mellitus without complications: Secondary | ICD-10-CM | POA: Diagnosis not present

## 2017-04-16 DIAGNOSIS — E878 Other disorders of electrolyte and fluid balance, not elsewhere classified: Secondary | ICD-10-CM | POA: Diagnosis present

## 2017-04-16 DIAGNOSIS — E162 Hypoglycemia, unspecified: Secondary | ICD-10-CM | POA: Diagnosis present

## 2017-04-16 DIAGNOSIS — I959 Hypotension, unspecified: Secondary | ICD-10-CM | POA: Diagnosis present

## 2017-04-16 DIAGNOSIS — E0590 Thyrotoxicosis, unspecified without thyrotoxic crisis or storm: Secondary | ICD-10-CM | POA: Diagnosis present

## 2017-04-16 DIAGNOSIS — N390 Urinary tract infection, site not specified: Secondary | ICD-10-CM | POA: Diagnosis not present

## 2017-04-16 DIAGNOSIS — M109 Gout, unspecified: Secondary | ICD-10-CM | POA: Diagnosis present

## 2017-04-16 DIAGNOSIS — I480 Paroxysmal atrial fibrillation: Secondary | ICD-10-CM | POA: Diagnosis not present

## 2017-04-16 DIAGNOSIS — Z7984 Long term (current) use of oral hypoglycemic drugs: Secondary | ICD-10-CM | POA: Diagnosis not present

## 2017-04-16 DIAGNOSIS — E11649 Type 2 diabetes mellitus with hypoglycemia without coma: Secondary | ICD-10-CM | POA: Diagnosis present

## 2017-04-16 DIAGNOSIS — Z8249 Family history of ischemic heart disease and other diseases of the circulatory system: Secondary | ICD-10-CM | POA: Diagnosis not present

## 2017-04-16 DIAGNOSIS — E860 Dehydration: Secondary | ICD-10-CM | POA: Diagnosis present

## 2017-04-16 DIAGNOSIS — N179 Acute kidney failure, unspecified: Secondary | ICD-10-CM | POA: Diagnosis present

## 2017-04-16 DIAGNOSIS — D631 Anemia in chronic kidney disease: Secondary | ICD-10-CM | POA: Diagnosis present

## 2017-04-16 DIAGNOSIS — E669 Obesity, unspecified: Secondary | ICD-10-CM | POA: Diagnosis present

## 2017-04-16 DIAGNOSIS — E785 Hyperlipidemia, unspecified: Secondary | ICD-10-CM | POA: Diagnosis present

## 2017-04-16 DIAGNOSIS — I129 Hypertensive chronic kidney disease with stage 1 through stage 4 chronic kidney disease, or unspecified chronic kidney disease: Secondary | ICD-10-CM | POA: Diagnosis present

## 2017-04-16 DIAGNOSIS — Z6833 Body mass index (BMI) 33.0-33.9, adult: Secondary | ICD-10-CM | POA: Diagnosis not present

## 2017-04-16 DIAGNOSIS — Z79899 Other long term (current) drug therapy: Secondary | ICD-10-CM | POA: Diagnosis not present

## 2017-04-16 DIAGNOSIS — E1122 Type 2 diabetes mellitus with diabetic chronic kidney disease: Secondary | ICD-10-CM | POA: Diagnosis present

## 2017-04-16 DIAGNOSIS — Z794 Long term (current) use of insulin: Secondary | ICD-10-CM | POA: Diagnosis not present

## 2017-04-16 DIAGNOSIS — Z9049 Acquired absence of other specified parts of digestive tract: Secondary | ICD-10-CM | POA: Diagnosis not present

## 2017-04-16 LAB — COMPREHENSIVE METABOLIC PANEL
ALBUMIN: 2.9 g/dL — AB (ref 3.5–5.0)
ALT: 7 U/L — ABNORMAL LOW (ref 14–54)
ANION GAP: 7 (ref 5–15)
AST: 10 U/L — AB (ref 15–41)
Alkaline Phosphatase: 52 U/L (ref 38–126)
BUN: 68 mg/dL — AB (ref 6–20)
CHLORIDE: 118 mmol/L — AB (ref 101–111)
CO2: 18 mmol/L — AB (ref 22–32)
Calcium: 8.3 mg/dL — ABNORMAL LOW (ref 8.9–10.3)
Creatinine, Ser: 1.83 mg/dL — ABNORMAL HIGH (ref 0.44–1.00)
GFR calc Af Amer: 31 mL/min — ABNORMAL LOW (ref >60)
GFR calc non Af Amer: 26 mL/min — ABNORMAL LOW (ref >60)
GLUCOSE: 112 mg/dL — AB (ref 65–99)
POTASSIUM: 4 mmol/L (ref 3.5–5.1)
SODIUM: 143 mmol/L (ref 135–145)
TOTAL PROTEIN: 5.2 g/dL — AB (ref 6.5–8.1)
Total Bilirubin: 0.6 mg/dL (ref 0.3–1.2)

## 2017-04-16 LAB — HEPARIN LEVEL (UNFRACTIONATED)
HEPARIN UNFRACTIONATED: 0.44 [IU]/mL (ref 0.30–0.70)
Heparin Unfractionated: 0.34 IU/mL (ref 0.30–0.70)

## 2017-04-16 LAB — CBC WITH DIFFERENTIAL/PLATELET
Basophils Absolute: 0 10*3/uL (ref 0.0–0.1)
Basophils Relative: 0 %
Eosinophils Absolute: 0.2 10*3/uL (ref 0.0–0.7)
Eosinophils Relative: 3 %
HCT: 29.7 % — ABNORMAL LOW (ref 36.0–46.0)
Hemoglobin: 9.5 g/dL — ABNORMAL LOW (ref 12.0–15.0)
Lymphocytes Relative: 24 %
Lymphs Abs: 1.6 10*3/uL (ref 0.7–4.0)
MCH: 27.3 pg (ref 26.0–34.0)
MCHC: 32 g/dL (ref 30.0–36.0)
MCV: 85.3 fL (ref 78.0–100.0)
Monocytes Absolute: 0.5 10*3/uL (ref 0.1–1.0)
Monocytes Relative: 8 %
Neutro Abs: 4.2 10*3/uL (ref 1.7–7.7)
Neutrophils Relative %: 65 %
Platelets: 155 10*3/uL (ref 150–400)
RBC: 3.48 MIL/uL — ABNORMAL LOW (ref 3.87–5.11)
RDW: 16 % — ABNORMAL HIGH (ref 11.5–15.5)
WBC: 6.5 10*3/uL (ref 4.0–10.5)

## 2017-04-16 LAB — URINALYSIS, ROUTINE W REFLEX MICROSCOPIC
Bilirubin Urine: NEGATIVE
GLUCOSE, UA: NEGATIVE mg/dL
HGB URINE DIPSTICK: NEGATIVE
Ketones, ur: NEGATIVE mg/dL
Nitrite: POSITIVE — AB
PH: 5 (ref 5.0–8.0)
Protein, ur: NEGATIVE mg/dL
SQUAMOUS EPITHELIAL / LPF: NONE SEEN
Specific Gravity, Urine: 1.013 (ref 1.005–1.030)

## 2017-04-16 LAB — PHOSPHORUS: Phosphorus: 2.8 mg/dL (ref 2.5–4.6)

## 2017-04-16 LAB — ECHOCARDIOGRAM COMPLETE
HEIGHTINCHES: 65.5 in
Weight: 3312 oz

## 2017-04-16 LAB — MAGNESIUM: Magnesium: 1.9 mg/dL (ref 1.7–2.4)

## 2017-04-16 LAB — SODIUM, URINE, RANDOM: Sodium, Ur: 75 mmol/L

## 2017-04-16 LAB — GLUCOSE, CAPILLARY
GLUCOSE-CAPILLARY: 116 mg/dL — AB (ref 65–99)
GLUCOSE-CAPILLARY: 279 mg/dL — AB (ref 65–99)
GLUCOSE-CAPILLARY: 181 mg/dL — AB (ref 65–99)
Glucose-Capillary: 116 mg/dL — ABNORMAL HIGH (ref 65–99)
Glucose-Capillary: 159 mg/dL — ABNORMAL HIGH (ref 65–99)

## 2017-04-16 LAB — CREATININE, URINE, RANDOM: Creatinine, Urine: 52.09 mg/dL

## 2017-04-16 MED ORDER — SIMVASTATIN 20 MG PO TABS
20.0000 mg | ORAL_TABLET | Freq: Every day | ORAL | Status: DC
  Administered 2017-04-16: 20 mg via ORAL
  Filled 2017-04-16: qty 1

## 2017-04-16 MED ORDER — POTASSIUM CL IN DEXTROSE 5% 20 MEQ/L IV SOLN
20.0000 meq | INTRAVENOUS | Status: DC
  Administered 2017-04-16 – 2017-04-17 (×3): 20 meq via INTRAVENOUS
  Filled 2017-04-16 (×4): qty 1000

## 2017-04-16 NOTE — Progress Notes (Addendum)
Notified MD of bradycardia 38-54 and low TSH. Will notify if patient becomes symptomatic going forward.

## 2017-04-16 NOTE — Consult Note (Signed)
Cardiology Consult    Patient ID: Sherri Pratt MRN: [id number], DOB/AGE: Feb 23, 1946   Admit date: 04/15/2017 Date of Consult: 04/16/2017  Primary Physician: [name], NP Primary Cardiologist: New Requesting Provider: Marland Mcalpine Reason for Consultation: Afib   Sherri Pratt is a 71 y.o. female who is being seen today for the evaluation of Afib at the request of Dr. Marland Mcalpine.  Patient Profile    71 yo female with PMH of DM, HTN, HL who presented with increasing weakness, poor oral intake and developed Afib RVR.   Past Medical History   Past Medical History:  Diagnosis Date  . Anemia   . Chronic kidney disease   . Diabetes mellitus   . Gout   . Hyperlipidemia   . Hypertension     Past Surgical History:  Procedure Laterality Date  . APPENDECTOMY       Allergies  No Known Allergies  History of Present Illness    Sherri Pratt is a 71 yo female with PMH of DM, HTN, HL. She currently lives independently with her husband at home. Reports 2 weeks ago the air conditioning in her home stopped working. Over the period of those weeks she has felt increasingly weak, had some nausea/diarrhea as well. Feels that she has been dehydrated with poor oral intake and appetite. Denies any chest pain, dyspnea, palpitations, dizziness or lower extremity edema. She denies any previous cardiac hx.   Was brought to the ED by her son on 04/15/17 with ongoing weakness. In the ED labs showed Cr 3.25, Hgb 11.4, stable electrolytes, CBG 58. CT head was negative. EKG showed SR with nonspecific IVCD, and prolonged QT interval. She was admitted for IV hydration and further work up. While inpatient last evening developed brief episode of Afib RVR with rates in the 170s. She converted spontaneously without the use of medications. Placed on IV heparin. She reported being asymptomatic during this event.   Inpatient Medications      Family History    Family History  Problem Relation Age of Onset  . Cancer  Mother        colon  . Hypertension Mother   . Cancer Father        colon    Social History    Social History   Social History  . Marital status: Married    Spouse name: N/A  . Number of children: N/A  . Years of education: N/A   Occupational History  . Not on file.   Social History Main Topics  . Smoking status: Never Smoker  . Smokeless tobacco: Never Used  . Alcohol use No  . Drug use: No  . Sexual activity: Not on file   Other Topics Concern  . Not on file   Social History Narrative   Retired - She Forensic psychologist   Married for 51 years    Three children - all in [place]      She likes to quilting and shopping.      Review of Systems    See HPI  All other systems reviewed and are otherwise negative except as noted above.  Physical Exam    Blood pressure (!) 118/48, pulse 88, temperature 98.1 F (36.7 C), temperature source Oral, resp. rate 18, height 5' 5.5" (1.664 m), weight 207 lb (93.9 kg), SpO2 98 %.  General: Pleasant obese older WF, NAD Psych: Normal affect. Neuro: Alert and oriented X 3. Moves all extremities spontaneously. HEENT: Normal  Neck: Supple without bruits  or JVD. Lungs:  Resp regular and unlabored, CTA. Heart: RRR no s3, s4, or murmurs. Abdomen: Soft, non-tender, non-distended, BS + x 4.  Extremities: No clubbing, cyanosis or edema. DP/PT/Radials 2+ and equal bilaterally.  Labs    Troponin [facility] of Care Test)  Recent Labs  04/15/17 0349  TROPIPOC 0.06    Recent Labs  04/15/17 1031  CKTOTAL 30*   Lab Results  Component Value Date   WBC 6.5 04/16/2017   HGB 9.5 (L) 04/16/2017   HCT 29.7 (L) 04/16/2017   MCV 85.3 04/16/2017   PLT 155 04/16/2017     Recent Labs Lab 04/16/17 0542  NA 143  K 4.0  CL 118*  CO2 18*  BUN 68*  CREATININE 1.83*  CALCIUM 8.3*  PROT 5.2*  BILITOT 0.6  ALKPHOS 52  ALT 7*  AST 10*  GLUCOSE 112*   Lab Results  Component Value Date   CHOL 136 11/10/2016   HDL 41.60 11/10/2016    LDLCALC 58 11/10/2016   TRIG 181.0 (H) 11/10/2016   No results found for: [facility]   Radiology Studies    Ct Head Wo Contrast  Result Date: 04/15/2017 CLINICAL DATA:  Acute onset of generalized weakness. Initial encounter. EXAM: CT HEAD WITHOUT CONTRAST TECHNIQUE: Contiguous axial images were obtained from the base of the skull through the vertex without intravenous contrast. COMPARISON:  None. FINDINGS: Brain: No evidence of acute infarction, hemorrhage, hydrocephalus, extra-axial collection or mass lesion/mass effect. Prominence of the ventricles and sulci reflects mild to moderate cortical volume loss. Mild cerebellar atrophy is noted. Mild periventricular white matter change likely reflects small vessel ischemic microangiopathy. The brainstem and fourth ventricle are within normal limits. The basal ganglia are unremarkable in appearance. The cerebral hemispheres demonstrate grossly normal gray-white differentiation. No mass effect or midline shift is seen. Vascular: No hyperdense vessel or unexpected calcification. Skull: There is no evidence of fracture; visualized osseous structures are unremarkable in appearance. Sinuses/Orbits: The orbits are within normal limits. The paranasal sinuses and mastoid air cells are well-aerated. Other: No significant soft tissue abnormalities are seen. IMPRESSION: 1. No acute intracranial pathology seen on CT. 2. Mild to moderate cortical volume loss and scattered small vessel ischemic microangiopathy. Electronically Signed   By: Roanna Raider M.D.   On: 04/15/2017 03:06   US Abdomen Complete  Result Date: 04/15/2017 CLINICAL DATA:  Acute renal failure.  Nausea. EXAM: ABDOMEN ULTRASOUND COMPLETE COMPARISON:  None. FINDINGS: Gallbladder: No gallstones or wall thickening visualized. No sonographic Murphy sign noted by sonographer. Common bile duct: Diameter: 0.5 cm Liver: No focal lesion identified. Within normal limits in parenchymal echogenicity. IVC: No abnormality  visualized. Pancreas: Visualized portion unremarkable. Spleen: Size and appearance within normal limits. Right Kidney: Length: 14.6 cm. No hydronephrosis or mass. Marked cortical thinning is noted. Left Kidney: Length: 12.0 cm. No hydronephrosis or mass. Cortex is markedly thinned. Abdominal aorta: No aneurysm visualized. Other findings: None. IMPRESSION: No acute abnormality. Cortical thinning in both kidneys.  Negative for hydronephrosis. Electronically Signed   By: Drusilla Kanner M.D.   On: 04/15/2017 09:27    ECG & Cardiac Imaging    EKG: 04/14/17 Afib RVR  Echo: Pending  Assessment & Plan    71 yo female with PMH of DM, HTN, HL who presented with increasing weakness, poor oral intake and developed Afib RVR.   1. Afib RVR: Had a brief episode last evening while at rest, and converted spontaneously. Reports being asymptomatic during this event. Has maintained SR since  that episode. Concerning as she has been having episodes of weakness over the past couple of weeks, question whether she has been having episodes of Afib at home.  -- remains in SR, does have episodes of bradycardia down into the 30s while at rest. Therefore would avoid rate controlling agents at this time. Suspect this could be 2/2 acute state. No signs of HF on exam.  -- This patients CHA2DS2-VASc Score and unadjusted Ischemic Stroke Rate (% per year) is equal to 4.8 % stroke rate/year from a score of 4 HTN, DM, Age if 4965-74, Female. Did have AKI on admission, but now improving. Would continue to monitor Cr, if improves would be a candidate for DOAC, if not then coumadin.  -- echo pending  2. AKI: Likely 2/2 poor oral intake/dehydration. Improved with IVFs.  -- daily BMET  3. HTN: Was hypotensive on arrival, but now improved. ACEi held  4. DM: hypoglycemic on admission. Metformin held. -- plans per primary  5. HL: on home statin  Signed, Laverda PageLindsay Roberts, NP-C Pager [phone] 04/16/2017, 11:37 AM   I have seen and  examined the patient along with Laverda PageLindsay Roberts, NP-C.  I have reviewed the chart, notes and new data.  I agree with NP's note.  Key new complaints: feeling stronger than on admission Key examination changes: normal rhythm and CV exam. Key new findings / data: echo shows mild HTNive changes (mild LVH and mildly dilated LA), but preserved systolic and diastolic LV function.  PLAN: Unclear if the arrhythmia is entirely due to the acute illness (possible) or we stumbled upon asymptomatic episodes of preexistent paroxysmal atrial fibrillation. Would recommend starting DOAC and then 30 day event monitor. If there is no arrhythmia recurrence, consider stopping the DOAC. Agree that at this point rate control meds should not be started - more likely to cause complications of bradycardia than they would help with RVR.   Thurmon FairMihai Marco Adelson, MD, [facility] [phone] 04/16/2017, 1:10 PM

## 2017-04-16 NOTE — Progress Notes (Signed)
  Echocardiogram 2D Echocardiogram has been performed.  Kimmy Totten 04/16/2017, 10:03 AM

## 2017-04-16 NOTE — Progress Notes (Signed)
Physical Therapy Treatment Patient Details Name: CARISMA TROUPE MRN: [id number] DOB: 1946/03/10 Today's Date: 04/16/2017    History of Present Illness pt is a 71 y/o female with h/o dm2, HTN, CKD, admitted to ED  increasingly weak and less able to walk  over 2 week period.  She also had decreased oral intake over the same period.  She is admitted for acute renal failure likely from dehydration.  On pm of 04/15/17, pt developed A-Fib with RVR    PT Comments    Pt demonstrates improved tolerance for gait and mobility this session with decreased assistance required for transfers. Pt performs gait with RW with cues for proper positioning within walker. Pt wants to return home at discharge, depending on progress this might be a possibility.     Follow Up Recommendations  SNF     Equipment Recommendations  Rolling walker with 5" wheels    Recommendations for Other Services       Precautions / Restrictions Precautions Precautions: Fall Restrictions Weight Bearing Restrictions: No    Mobility  Bed Mobility               General bed mobility comments: Pt sitting up in recliner   Transfers Overall transfer level: Needs assistance Equipment used: None Transfers: Sit to/from Stand;Stand Pivot Transfers Sit to Stand: Min guard Stand pivot transfers: Min assist       General transfer comment: Miin guard for safety from recliner and commode  Ambulation/Gait Ambulation/Gait assistance: Min guard Ambulation Distance (Feet): 75 Feet Assistive device: Rolling walker (2 wheeled) Gait Pattern/deviations: Step-through pattern;Decreased step length - right;Decreased step length - left Gait velocity: decreased Gait velocity interpretation: Below normal speed for age/gender General Gait Details: decreased step length bilaterally, cues for staying within walker and taking increased steps in order to improve mobility   Stairs            Wheelchair Mobility    Modified Rankin  (Stroke Patients Only)       Balance Overall balance assessment: Needs assistance Sitting-balance support: Feet supported Sitting balance-Leahy Scale: Good     Standing balance support: No upper extremity supported;During functional activity Standing balance-Leahy Scale: Fair Standing balance comment: able to stand at sink to wash hands with min guard                            Cognition Arousal/Alertness: Awake/alert Behavior During Therapy: Flat affect Overall Cognitive Status: No family/caregiver present to determine baseline cognitive functioning Area of Impairment: Problem solving                             Problem Solving: Slow processing General Comments: Pt moves very slowly and is very slow to initiate and perform activities       Exercises      General Comments General comments (skin integrity, edema, etc.): HR 84 with activity       Pertinent Vitals/Pain Pain Assessment: No/denies pain    Home Living Family/patient expects to be discharged to:: Private residence Living Arrangements: Spouse/significant other Available Help at Discharge: Family;Available 24 hours/day Type of Home: House Home Access: Stairs to enter Entrance Stairs-Rails: None Home Layout: One level Home Equipment: None Additional Comments: spouse has dementia and is unreliable  with making decisions     Prior Function Level of Independence: Needs assistance  Gait / Transfers Assistance Needed: independent up until last 2  weaks and then needing significant amounts of assist ADL's / Homemaking Assistance Needed: has been independent with ADL's up until last 2 weeks per pt/husband Comments: prior to two weeks ago, pt reports she was grocery shopping, driving, and independent in the community    PT Goals (current goals can now be found in the care plan section) Acute Rehab PT Goals Patient Stated Goal: to go home  Progress towards PT goals: Progressing toward  goals    Frequency    Min 3X/week      PT Plan Current plan remains appropriate    Co-evaluation              AM-PAC PT "6 Clicks" Daily Activity  Outcome Measure  Difficulty turning over in bed (including adjusting bedclothes, sheets and blankets)?: None Difficulty moving from lying on back to sitting on the side of the bed? : None Difficulty sitting down on and standing up from a chair with arms (e.g., wheelchair, bedside commode, etc,.)?: Total Help needed moving to and from a bed to chair (including a wheelchair)?: A Little Help needed walking in hospital room?: A Little Help needed climbing 3-5 steps with a railing? : A Lot 6 Click Score: 17    End of Session Equipment Utilized During Treatment: Gait belt Activity Tolerance: Patient tolerated treatment well Patient left: in bed;with call bell/phone within reach;with family/visitor present Nurse Communication: Mobility status PT Visit Diagnosis: Unsteadiness on feet (R26.81);Muscle weakness (generalized) (M62.81);Other abnormalities of gait and mobility (R26.89)     Time: 1039-1101 PT Time Calculation (min) (ACUTE ONLY): 22 min  Charges:  $Gait Training: 8-22 mins                    G Codes:       Colin BroachSabra M. Sofija Antwi PT, DPT  [phone]    Ruel FavorsSabra Aletha HalimMarie Teddy Rebstock 04/16/2017, 1:37 PM

## 2017-04-16 NOTE — Evaluation (Signed)
Occupational Therapy Evaluation Patient Details Name: Sherri Pratt MRN: [id number] DOB: Sep 27, 1946 Today's Date: 04/16/2017    History of Present Illness pt is a 71 y/o female with h/o dm2, HTN, CKD, admitted to ED  increasingly weak and less able to walk  over 2 week period.  She also had decreased oral intake over the same period.  She is admitted for acute renal failure likely from dehydration.  On pm of 04/15/17, pt developed A-Fib with RVR   Clinical Impression   Pt admitted with above. She demonstrates the below listed deficits and will benefit from continued OT to maximize safety and independence with BADLs.  Pt presents with generalized weakness, decreased activity tolerance, impaired balance, and mild cognitive deficits - she is VERY slow to initiate and perform ADL activities.  She fatigues quite quickly with minimal activity.  As she fatigues, her balance worsens placing her at increased risk of fall and injury.  Per family, pt's spouse has dementia and is an unreliable caregiver, and do not feel pt can safely maintain herself over a 24 hour period.  Feel she would benefit from short SNF stay to allow her to increase her strength, endurance, and speed of processing thus allowing her to return home safely with spouse at modified independent level.  Pt agreeable to SNF.       Follow Up Recommendations  SNF    Equipment Recommendations  3 in 1 bedside commode    Recommendations for Other Services       Precautions / Restrictions Restrictions Weight Bearing Restrictions: No      Mobility Bed Mobility               General bed mobility comments: Pt sitting up in recliner   Transfers Overall transfer level: Needs assistance Equipment used: None Transfers: Sit to/from Stand;Stand Pivot Transfers Sit to Stand: Min guard Stand pivot transfers: Min assist       General transfer comment: min A for balance as she fatigues     Balance Overall balance assessment: Needs  assistance Sitting-balance support: Feet supported Sitting balance-Leahy Scale: Good     Standing balance support: During functional activity;Single extremity supported Standing balance-Leahy Scale: Fair Standing balance comment: Pt able to maintain static standing with min guard assist, but requires UE support and occasional min A for dynamic standing                            ADL either performed or assessed with clinical judgement   ADL Overall ADL's : Needs assistance/impaired Eating/Feeding: Independent   Grooming: Wash/dry hands;Wash/dry face;Oral care;Brushing hair;Min guard;Standing   Upper Body Bathing: Set up;Supervision/ safety;Sitting   Lower Body Bathing: Minimal assistance;Sit to/from stand   Upper Body Dressing : Set up;Supervision/safety;Sitting   Lower Body Dressing: Minimal assistance;Sit to/from stand Lower Body Dressing Details (indicate cue type and reason): pt requires a significant amount of time to perform LB dressing.  Requires assist to pull pants over hips and for balance  Toilet Transfer: Minimal assistance;Ambulation;Regular Toilet;Grab bars   Toileting- Clothing Manipulation and Hygiene: Minimal assistance;Sit to/from stand Toileting - Clothing Manipulation Details (indicate cue type and reason): assist to pull pants over hips and for balance      Functional mobility during ADLs: Minimal assistance General ADL Comments: Pt fatigues quite rapidly.  and when she does, increased LOB noted with functional transfers and ADLs.        Vision Baseline Vision/History: No  visual deficits Patient Visual Report: No change from baseline       Perception     Praxis      Pertinent Vitals/Pain Pain Assessment: No/denies pain     Hand Dominance Right   Extremity/Trunk Assessment Upper Extremity Assessment Upper Extremity Assessment: Generalized weakness   Lower Extremity Assessment Lower Extremity Assessment: Defer to PT evaluation    Cervical / Trunk Assessment Cervical / Trunk Assessment: Kyphotic   Communication Communication Communication: No difficulties   Cognition Arousal/Alertness: Awake/alert Behavior During Therapy: Flat affect Overall Cognitive Status: Impaired/Different from baseline Area of Impairment: Problem solving                             Problem Solving: Slow processing General Comments: Pt moves very slowly and is very slow to initiate and perform activities    General Comments  HR 84 with activity     Exercises     Shoulder Instructions      Home Living Family/patient expects to be discharged to:: Private residence Living Arrangements: Spouse/significant other Available Help at Discharge: Family;Available 24 hours/day Type of Home: House Home Access: Stairs to enter [employer] of Steps: 2 Entrance Stairs-Rails: None Home Layout: One level     Bathroom Shower/Tub: Producer, television/film/video: Standard     Home Equipment: None   Additional Comments: spouse has dementia and is unreliable  with making decisions       Prior Functioning/Environment Level of Independence: Needs assistance  Gait / Transfers Assistance Needed: independent up until last 2 weaks and then needing significant amounts of assist ADL's / Homemaking Assistance Needed: has been independent with ADL's up until last 2 weeks per pt/husband   Comments: prior to two weeks ago, pt reports she was grocery shopping, driving, and independent in the community         OT Problem List: Decreased strength;Decreased activity tolerance;Impaired balance (sitting and/or standing);Decreased cognition;Decreased safety awareness;Cardiopulmonary status limiting activity      OT Treatment/Interventions: Self-care/ADL training;Therapeutic exercise;Energy conservation;DME and/or AE instruction;Therapeutic activities;Cognitive remediation/compensation;Patient/family education;Balance training     OT Goals(Current goals can be found in the care plan section) Acute Rehab OT Goals Patient Stated Goal: to go home  OT Goal Formulation: With patient Time For Goal Achievement: 04/30/17 Potential to Achieve Goals: Good ADL Goals Pt Will Perform Grooming: with modified independence;standing Pt Will Perform Upper Body Bathing: with modified independence;sitting Pt Will Perform Lower Body Bathing: with modified independence;sit to/from stand Pt Will Perform Upper Body Dressing: with modified independence;sitting Pt Will Perform Lower Body Dressing: with modified independence;sit to/from stand Pt Will Transfer to Toilet: with modified independence;ambulating;regular height toilet;grab bars Pt Will Perform Toileting - Clothing Manipulation and hygiene: with modified independence;sit to/from stand Pt Will Perform Tub/Shower Transfer: Shower transfer;with supervision;ambulating;3 in 1 Pt/caregiver will Perform Home Exercise Program: Increased strength;Right Upper extremity;Left upper extremity;With theraband;With written HEP provided;With Supervision  OT Frequency: Min 2X/week   Barriers to D/C: Decreased caregiver support  spouse with dementia and is unreliable caregiver        Co-evaluation              AM-PAC PT "6 Clicks" Daily Activity     Outcome Measure Help from another person eating meals?: None Help from another person taking care of personal grooming?: A Little Help from another person toileting, which includes using toliet, bedpan, or urinal?: A Little Help from another person bathing (including washing, rinsing,  drying)?: A Little Help from another person to put on and taking off regular upper body clothing?: A Little Help from another person to put on and taking off regular lower body clothing?: A Little 6 Click Score: 19   End of Session Equipment Utilized During Treatment: Gait belt Nurse Communication: Mobility status  Activity Tolerance: Patient limited by  fatigue Patient left: in chair;with call bell/phone within reach;with family/visitor present  OT Visit Diagnosis: Unsteadiness on feet (R26.81)                Time: 4132-44011150-1217 OT Time Calculation (min): 27 min Charges:    G-Codes:     Jeani HawkingWendi Emarion Toral, OTR/L [phone]   Jeani Hawkingonarpe, Lucetta Baehr M 04/16/2017, 12:34 PM

## 2017-04-16 NOTE — Progress Notes (Signed)
ANTICOAGULATION CONSULT NOTE - Initial Consult  Pharmacy Consult:  Heparin Indication: atrial fibrillation  No Known Allergies  Patient Measurements: Height: 5' 5.5" (166.4 cm) Weight: 207 lb (93.9 kg) IBW/kg (Calculated) : 58.15 Heparin Dosing Weight: 79 kg  Vital Signs: Temp: 97.7 F (36.5 C) (07/05 0427) Temp Source: Oral (07/05 0427) BP: 120/43 (07/05 0427) Pulse Rate: 57 (07/05 0427)  Labs:  Recent Labs  04/15/17 0300 04/15/17 0542 04/15/17 1031 04/15/17 2125 04/16/17 0542  HGB 11.4* 10.5*  --   --  9.5*  HCT 34.1* 32.1*  --   --  29.7*  PLT 204 178  --   --  155  HEPARINUNFRC  --   --   --   --  0.34  CREATININE 3.25* 2.85* 2.49* 2.07*  --   CKTOTAL  --   --  30*  --   --     Estimated Creatinine Clearance: 28.1 mL/min (A) (by C-G formula based on SCr of 2.07 mg/dL (H)).   Medical History: Past Medical History:  Diagnosis Date  . Anemia   . Chronic kidney disease   . Diabetes mellitus   . Gout   . Hyperlipidemia   . Hypertension     Assessment: [age] YOF with new-onset Afib RVR that converted to NSR.  Given high CHADsVASc score, Pharmacy consulted to initiate IV heparin. No PTA oral anticoagulation. Hgb with slight downtrend from 10.5 to 9.5, platelets within normal limits. Heparin level therapeutic at 0.34. No overt s/s bleeding noted.   Goal of Therapy:  Heparin level 0.3-0.7 units/ml Monitor platelets by anticoagulation protocol: Yes   Plan:  Continue heparin gtt at 1150 units/hr Confirmatory heparin level in 8 hours Daily heparin level and CBC Monitor for s/s bleeding  F/u long-term AC plan   York CeriseKatherine Cook, PharmD Clinical Pharmacist 04/16/17 6:30 AM

## 2017-04-16 NOTE — Progress Notes (Signed)
PROGRESS NOTE    Sherri Pratt  WJX:[id number] DOB: 04-25-1946 DOA: 04/15/2017 PCP: [name], NP  Brief Narrative:  Patient is a a 71 year old female with a PMH of Insulin Dependent Diabetes Mellitus, Gout, HTN, HLD, CKD Stage 2, Anemia and other comorbids who presented to [facility] ED with hypoglycemia, weakness and not eating well along with Nausea and Vomiting. She was found to have an AKI on CKD Stage 2 with a BUN/Cr of 147/3.25 and admitted for Dehydration. Abdominal U/S showed no acute abnormality and cortical thinning in both kidneys and Negative for Hydronephrosis. Patient was responding well to IVF Rehydration but had a Rapid Response overnight after she had an increased HR from 170-190's and was found to be in A Fib RVR which spontaneously converted back to NSR. She was placed on Heparin gtt as CHADS2VASc was elevated.   Assessment & Plan:   Principal Problem:   ARF (acute renal failure) (HCC) Active Problems:   Diabetes 1.5, managed as type 1 (HCC)   Essential hypertension   Chronic kidney disease   Anemia   AKI (acute kidney injury) (HCC)  Acute renal failure on chronic kidney disease stage II likely from dehydration and poor oral intake, improving -Patient's BUN/Cr went from 147/3.25 -> 120/2.49 -> 89/2.07 -> 68/1.83  -Was on IVF with NS at 125 mL/hr and now changed to D5W + 20 mEQ of KCl at 75 mL/hr -Urinalysis was not done -Abdominal U/S showed No acute abnormality. There was cortical thinning in both kidneys. Negative for hydronephrosis. -Repeat CMP in AM  Hyperchloremia  -Cl went to 118 -Changed IVF as above -Repeat CMP in AM  Hypertension presently Hypotensive  -Continue to hold off Antihypertensives.  Diabetes Mellitus Type 2  -Was Hypogylcemic on Admission;  -Holding off all antidiabetic medications and checking CBGs every 4 hourly for now. -CBG's ranging from 116-159  Hyperlipidemia -Checked CK levels and was 30 -Check Lipid Panel in  AM -Restarted Simvastatin 20 mg po Daily   Normocytic Normochromic Anemia  -Check Anemia Panel and showed Iron Level of 37, UIBC of 209, TIBC of 246, Saturation Ratios of 15, Ferritin of 197. -Patient's Hb/Hct was 9.5/29.7 -Continue to Monitor for S/Sx of Bleeding as patient is on Heparin gtt -Repeat CBC in AM  Paroxysmal Atrial Fibrillation with RVR -TSH was 0.203 -C/w Telemetry; Patient spontaneously converted yesterday -Cardiology Dr. Rachelle Hora Croitoru consulted for further evaluation and Managemnt -Has remained in SR with episodes of Bradycardia; Per Cardiology avoid Rate Controlling Agents -CHADS2-VASc was 4 -Started on Heparin gtt and once Kidney Fxn improves further start NOAC -ECHOCARDIOGRAM done and showed the estimated ejection fraction was in the range of 55% to 60%. Wall motion was normal; there were no regional wall motion abnormalities. Left ventricular diastolic function parameters were normal. -Continue to [place] and appreciated Cardiology Recc's with DOAC and 30 Day Event Monitor   DVT prophylaxis: Anticoagulated with Heparin gtt Code Status: FULL CODE Family Communication: No family present at bedside this AM Disposition Plan: SNF  Consultants:   Cardiology Dr. Rachelle Hora Croitoru   Procedures:  ECHOCARDIOGRAM ------------------------------------------------------------------- Study Conclusions  - Left ventricle: The cavity size was normal. There was mild   concentric hypertrophy. Systolic function was normal. The   estimated ejection fraction was in the range of 55% to 60%. Wall   motion was normal; there were no regional wall motion   abnormalities. Left ventricular diastolic function parameters   were normal. - Aortic valve: Valve area (VTI): 1.76 cm^2. Valve  area (Vmax):   1.75 cm^2. Valve area (Vmean): 1.62 cm^2. - Left atrium: The atrium was mildly dilated. - Pulmonary arteries: PA peak pressure: 32 mm Hg (S).   Antimicrobials: Anti-infectives    None      Subjective: Seen and examined and was feeling better today. No nausea or vomiting. Still remains a little weak. No other concerns or complaints at this time.   Objective: Vitals:   04/15/17 1708 04/15/17 2024 04/15/17 2048 04/16/17 0427  BP: (!) 111/47 (!) 118/92 (!) 100/44 (!) 120/43  Pulse: 66 88 64 (!) 57  Resp: 18 19 18 18   Temp: 98.2 F (36.8 C) 98.5 F (36.9 C) 98.3 F (36.8 C) 97.7 F (36.5 C)  TempSrc: Oral Oral Oral Oral  SpO2: 98% 99% 97% 98%  Weight:  93.9 kg (207 lb)    Height:        Intake/Output Summary (Last 24 hours) at 04/16/17 0808 Last data filed at 04/16/17 0601  Gross per 24 hour  Intake          2854.06 ml  Output             1000 ml  Net          1854.06 ml   Filed Weights   04/15/17 0610 04/15/17 2024  Weight: 93.1 kg (205 lb 4.8 oz) 93.9 kg (207 lb)   Examination: Physical Exam:  Constitutional:  NAD and appears calm and comfortable Eyes: Lids and conjunctivae normal, sclerae anicteric  ENMT: External Ears, Nose appear normal. Grossly normal hearing. Mucous membranes are slightly dry.  Neck: Appears normal, supple, no cervical masses, normal ROM, no appreciable thyromegaly, no JVD Respiratory: Clear to auscultation bilaterally, no wheezing, rales, rhonchi or crackles. Normal respiratory effort and patient is not tachypenic. No accessory muscle use.  Cardiovascular: RRR, no murmurs / rubs / gallops. S1 and S2 auscultated. No extremity edema. Abdomen: Soft, non-tender, non-distended. No masses palpated. No appreciable hepatosplenomegaly. Bowel sounds positive x4.  GU: Deferred. Musculoskeletal: No clubbing / cyanosis of digits/nails. No joint deformity upper and lower extremities. Skin: No rashes, lesions, ulcers on limited skin evaluation. No induration; Warm and dry.  Neurologic: CN 2-12 grossly intact with no focal deficits. Sensation intact in all 4 Extremities. Romberg sign cerebellar reflexes not assessed.  Psychiatric: Normal judgment  and insight. Alert and oriented x 3. Normal mood and appropriate affect.   Data Reviewed: I have personally reviewed following labs and imaging studies  CBC:  Recent Labs Lab 04/15/17 0300 04/15/17 0542 04/16/17 0542  WBC 11.4* 10.5 6.5  NEUTROABS 9.3*  --  4.2  HGB 11.4* 10.5* 9.5*  HCT 34.1* 32.1* 29.7*  MCV 83.4 82.9 85.3  PLT 204 178 155   Basic Metabolic Panel:  Recent Labs Lab 04/15/17 0300 04/15/17 0542 04/15/17 1031 04/15/17 2125 04/16/17 0542  NA 134*  --  137 142 143  K 4.4  --  4.3 4.2 4.0  CL 102  --  111 116* 118*  CO2 17*  --  16* 18* 18*  GLUCOSE 291*  --  206* 146* 112*  BUN 147*  --  120* 89* 68*  CREATININE 3.25* 2.85* 2.49* 2.07* 1.83*  CALCIUM 9.2  --  8.4* 8.7* 8.3*  MG  --   --  2.2 2.0 1.9  PHOS  --   --  3.8 2.9 2.8   GFR: Estimated Creatinine Clearance: 31.8 mL/min (A) (by C-G formula based on SCr of 1.83 mg/dL (H)). Liver Function Tests:  Recent Labs Lab 04/15/17 1031 04/16/17 0542  AST 11* 10*  ALT 8* 7*  ALKPHOS 58 52  BILITOT 0.5 0.6  PROT 5.7* 5.2*  ALBUMIN 3.1* 2.9*   No results for input(s): LIPASE, AMYLASE in the last 168 hours. No results for input(s): AMMONIA in the last 168 hours. Coagulation Profile: No results for input(s): INR, PROTIME in the last 168 hours. Cardiac Enzymes:  Recent Labs Lab 04/15/17 1031  CKTOTAL 30*   BNP (last 3 results) No results for input(s): PROBNP in the last 8760 hours. HbA1C: No results for input(s): HGBA1C in the last 72 hours. CBG:  Recent Labs Lab 04/15/17 1644 04/15/17 2005 04/15/17 2356 04/16/17 0410 04/16/17 0726  GLUCAP 207* 166* 134* 116* 116*   Lipid Profile: No results for input(s): CHOL, HDL, LDLCALC, TRIG, CHOLHDL, LDLDIRECT in the last 72 hours. Thyroid Function Tests:  Recent Labs  04/15/17 2125  TSH 0.203*   Anemia Panel:  Recent Labs  04/15/17 1031  VITAMINB12 453  FOLATE 11.9  FERRITIN 197  TIBC 246*  IRON 37  RETICCTPCT 1.6   Sepsis  Labs: No results for input(s): PROCALCITON, LATICACIDVEN in the last 168 hours.  No results found for this or any previous visit (from the past 240 hour(s)).   Radiology Studies: Ct Head Wo Contrast  Result Date: 04/15/2017 CLINICAL DATA:  Acute onset of generalized weakness. Initial encounter. EXAM: CT HEAD WITHOUT CONTRAST TECHNIQUE: Contiguous axial images were obtained from the base of the skull through the vertex without intravenous contrast. COMPARISON:  None. FINDINGS: Brain: No evidence of acute infarction, hemorrhage, hydrocephalus, extra-axial collection or mass lesion/mass effect. Prominence of the ventricles and sulci reflects mild to moderate cortical volume loss. Mild cerebellar atrophy is noted. Mild periventricular white matter change likely reflects small vessel ischemic microangiopathy. The brainstem and fourth ventricle are within normal limits. The basal ganglia are unremarkable in appearance. The cerebral hemispheres demonstrate grossly normal gray-white differentiation. No mass effect or midline shift is seen. Vascular: No hyperdense vessel or unexpected calcification. Skull: There is no evidence of fracture; visualized osseous structures are unremarkable in appearance. Sinuses/Orbits: The orbits are within normal limits. The paranasal sinuses and mastoid air cells are well-aerated. Other: No significant soft tissue abnormalities are seen. IMPRESSION: 1. No acute intracranial pathology seen on CT. 2. Mild to moderate cortical volume loss and scattered small vessel ischemic microangiopathy. Electronically Signed   By: Roanna Raider M.D.   On: 04/15/2017 03:06   US Abdomen Complete  Result Date: 04/15/2017 CLINICAL DATA:  Acute renal failure.  Nausea. EXAM: ABDOMEN ULTRASOUND COMPLETE COMPARISON:  None. FINDINGS: Gallbladder: No gallstones or wall thickening visualized. No sonographic Murphy sign noted by sonographer. Common bile duct: Diameter: 0.5 cm Liver: No focal lesion  identified. Within normal limits in parenchymal echogenicity. IVC: No abnormality visualized. Pancreas: Visualized portion unremarkable. Spleen: Size and appearance within normal limits. Right Kidney: Length: 14.6 cm. No hydronephrosis or mass. Marked cortical thinning is noted. Left Kidney: Length: 12.0 cm. No hydronephrosis or mass. Cortex is markedly thinned. Abdominal aorta: No aneurysm visualized. Other findings: None. IMPRESSION: No acute abnormality. Cortical thinning in both kidneys.  Negative for hydronephrosis. Electronically Signed   By: Drusilla Kanner M.D.   On: 04/15/2017 09:27   Scheduled Meds: Continuous Infusions: . dextrose 5 % with KCl 20 mEq / L    . heparin 1,150 Units/hr (04/15/17 2222)    LOS: 0 days   Merlene Laughter, DO [facility] Pager [phone]  If 7PM-7AM,  please contact night-coverage [email or web address] Password [email or web address] 04/16/2017, 8:08 AM

## 2017-04-16 NOTE — Progress Notes (Signed)
ANTICOAGULATION CONSULT NOTE  Pharmacy Consult:  Heparin Indication: atrial fibrillation  No Known Allergies  Patient Measurements: Height: 5' 5.5" (166.4 cm) Weight: 207 lb (93.9 kg) IBW/kg (Calculated) : 58.15 Heparin Dosing Weight: 79 kg  Vital Signs: Temp: 98.1 F (36.7 C) (07/05 0951) Temp Source: Oral (07/05 0951) BP: 118/48 (07/05 0951) Pulse Rate: 88 (07/05 0951)  Labs:  Recent Labs  04/15/17 0300 04/15/17 0542 04/15/17 1031 04/15/17 2125 04/16/17 0542 04/16/17 1433  HGB 11.4* 10.5*  --   --  9.5*  --   HCT 34.1* 32.1*  --   --  29.7*  --   PLT 204 178  --   --  155  --   HEPARINUNFRC  --   --   --   --  0.34 0.44  CREATININE 3.25* 2.85* 2.49* 2.07* 1.83*  --   CKTOTAL  --   --  30*  --   --   --     Estimated Creatinine Clearance: 31.8 mL/min (A) (by C-G formula based on SCr of 1.83 mg/dL (H)).   Medical History: Past Medical History:  Diagnosis Date  . Anemia   . Chronic kidney disease   . Diabetes mellitus   . Gout   . Hyperlipidemia   . Hypertension     Assessment: 89 YOF with new-onset Afib RVR that converted to NSR.  Given high CHADsVASc score,  Continues on heparin Planning DOAC  Heparin level therapeutic x 2  Goal of Therapy:  Heparin level 0.3-0.7 units/ml Monitor platelets by anticoagulation protocol: Yes   Plan:  Continue heparin gtt at 1150 units/hr F/u long-term [place] plan   Thank you Okey RegalLisa Aniza Shor, PharmD [phone] 04/16/17 3:18 PM

## 2017-04-16 NOTE — Significant Event (Signed)
Rapid Response Event Note  Overview: Time Called: 2017 Arrival Time: 2020 Event Type: Cardiac  Initial Focused Assessment: Called by charge RN about patient having increased HR from 170-190s for about 20 minutes.  Bedside RN did notify MD and EKG was obtained.  Upon arrival, patient was alert, oriented, and neuro intact.  She stated she felt fine and was not symptomatic of increased HR. When I arrived, HR was in the 60s. A second  EKG was done as well.  [facility], the first appeared in AFIB and the second was more regular and might be a JT.  I paged MD on call, we discussed the patient and EKG. VSS SBP 110-120s/ 40s, lung and heart sounds normal, + pulses, skin warm and dry.  MD reviewed EKGs and patient's chart  Interventions: - Serial EKGs - Ordered labs per MD ( BMP, MAG, PHOS, TSH). - MD did call me back after he spoke with CARDS MD, patient will be started on heparin drip, bedside RN made aware  Plan of Care (if not transferred): Called RN at 2205 for update, no more occurrences of AFIB noted.   Event Summary: Name of Physician Notified: Dr.  Toniann FailKakrakandy at 2045    at    Outcome: Stayed in room and stabalized  Event End Time: 2100  Sherri Pratt R

## 2017-04-17 ENCOUNTER — Other Ambulatory Visit: Payer: Self-pay | Admitting: Physician Assistant

## 2017-04-17 DIAGNOSIS — R2681 Unsteadiness on feet: Secondary | ICD-10-CM | POA: Diagnosis not present

## 2017-04-17 DIAGNOSIS — I480 Paroxysmal atrial fibrillation: Secondary | ICD-10-CM | POA: Diagnosis not present

## 2017-04-17 DIAGNOSIS — E1121 Type 2 diabetes mellitus with diabetic nephropathy: Secondary | ICD-10-CM | POA: Diagnosis not present

## 2017-04-17 DIAGNOSIS — R41841 Cognitive communication deficit: Secondary | ICD-10-CM | POA: Diagnosis not present

## 2017-04-17 DIAGNOSIS — M6281 Muscle weakness (generalized): Secondary | ICD-10-CM | POA: Diagnosis not present

## 2017-04-17 DIAGNOSIS — R2689 Other abnormalities of gait and mobility: Secondary | ICD-10-CM | POA: Diagnosis not present

## 2017-04-17 DIAGNOSIS — N178 Other acute kidney failure: Secondary | ICD-10-CM | POA: Diagnosis not present

## 2017-04-17 DIAGNOSIS — R278 Other lack of coordination: Secondary | ICD-10-CM | POA: Diagnosis not present

## 2017-04-17 DIAGNOSIS — N390 Urinary tract infection, site not specified: Secondary | ICD-10-CM | POA: Diagnosis not present

## 2017-04-17 DIAGNOSIS — D510 Vitamin B12 deficiency anemia due to intrinsic factor deficiency: Secondary | ICD-10-CM | POA: Diagnosis not present

## 2017-04-17 DIAGNOSIS — N179 Acute kidney failure, unspecified: Secondary | ICD-10-CM | POA: Diagnosis not present

## 2017-04-17 DIAGNOSIS — N182 Chronic kidney disease, stage 2 (mild): Secondary | ICD-10-CM | POA: Diagnosis not present

## 2017-04-17 DIAGNOSIS — I4891 Unspecified atrial fibrillation: Secondary | ICD-10-CM | POA: Diagnosis not present

## 2017-04-17 DIAGNOSIS — E0590 Thyrotoxicosis, unspecified without thyrotoxic crisis or storm: Secondary | ICD-10-CM

## 2017-04-17 LAB — COMPREHENSIVE METABOLIC PANEL
ALK PHOS: 44 U/L (ref 38–126)
ALT: 6 U/L — ABNORMAL LOW (ref 14–54)
ANION GAP: 7 (ref 5–15)
AST: 10 U/L — ABNORMAL LOW (ref 15–41)
Albumin: 2.8 g/dL — ABNORMAL LOW (ref 3.5–5.0)
BUN: 37 mg/dL — ABNORMAL HIGH (ref 6–20)
CALCIUM: 8.4 mg/dL — AB (ref 8.9–10.3)
CO2: 19 mmol/L — AB (ref 22–32)
CREATININE: 1.38 mg/dL — AB (ref 0.44–1.00)
Chloride: 114 mmol/L — ABNORMAL HIGH (ref 101–111)
GFR calc non Af Amer: 37 mL/min — ABNORMAL LOW (ref >60)
GFR, EST AFRICAN AMERICAN: 43 mL/min — AB (ref >60)
Glucose, Bld: 169 mg/dL — ABNORMAL HIGH (ref 65–99)
Potassium: 4 mmol/L (ref 3.5–5.1)
SODIUM: 140 mmol/L (ref 135–145)
TOTAL PROTEIN: 5.3 g/dL — AB (ref 6.5–8.1)
Total Bilirubin: 0.6 mg/dL (ref 0.3–1.2)

## 2017-04-17 LAB — CBC WITH DIFFERENTIAL/PLATELET
Basophils Absolute: 0 10*3/uL (ref 0.0–0.1)
Basophils Relative: 0 %
Eosinophils Absolute: 0.3 10*3/uL (ref 0.0–0.7)
Eosinophils Relative: 4 %
HEMATOCRIT: 29 % — AB (ref 36.0–46.0)
Hemoglobin: 9.2 g/dL — ABNORMAL LOW (ref 12.0–15.0)
LYMPHS ABS: 1.6 10*3/uL (ref 0.7–4.0)
LYMPHS PCT: 22 %
MCH: 26.8 pg (ref 26.0–34.0)
MCHC: 31.7 g/dL (ref 30.0–36.0)
MCV: 84.5 fL (ref 78.0–100.0)
MONO ABS: 0.4 10*3/uL (ref 0.1–1.0)
Monocytes Relative: 6 %
NEUTROS ABS: 5 10*3/uL (ref 1.7–7.7)
Neutrophils Relative %: 68 %
PLATELETS: 163 10*3/uL (ref 150–400)
RBC: 3.43 MIL/uL — ABNORMAL LOW (ref 3.87–5.11)
RDW: 15.9 % — ABNORMAL HIGH (ref 11.5–15.5)
WBC: 7.3 10*3/uL (ref 4.0–10.5)

## 2017-04-17 LAB — GLUCOSE, CAPILLARY
Glucose-Capillary: 156 mg/dL — ABNORMAL HIGH (ref 65–99)
Glucose-Capillary: 159 mg/dL — ABNORMAL HIGH (ref 65–99)
Glucose-Capillary: 214 mg/dL — ABNORMAL HIGH (ref 65–99)

## 2017-04-17 LAB — T4, FREE: FREE T4: 1.25 ng/dL — AB (ref 0.61–1.12)

## 2017-04-17 LAB — HEPARIN LEVEL (UNFRACTIONATED): Heparin Unfractionated: 0.44 IU/mL (ref 0.30–0.70)

## 2017-04-17 LAB — PHOSPHORUS: PHOSPHORUS: 2.1 mg/dL — AB (ref 2.5–4.6)

## 2017-04-17 LAB — MAGNESIUM: Magnesium: 1.4 mg/dL — ABNORMAL LOW (ref 1.7–2.4)

## 2017-04-17 MED ORDER — POTASSIUM PHOSPHATES 15 MMOLE/5ML IV SOLN
30.0000 mmol | Freq: Once | INTRAVENOUS | Status: AC
  Administered 2017-04-17: 30 mmol via INTRAVENOUS
  Filled 2017-04-17: qty 10

## 2017-04-17 MED ORDER — MAGNESIUM SULFATE 2 GM/50ML IV SOLN
2.0000 g | Freq: Once | INTRAVENOUS | Status: AC
  Administered 2017-04-17: 2 g via INTRAVENOUS
  Filled 2017-04-17: qty 50

## 2017-04-17 MED ORDER — APIXABAN 5 MG PO TABS: 5.0000 mg | ORAL_TABLET | Freq: Two times a day (BID) | ORAL | 0 refills | Status: DC

## 2017-04-17 MED ORDER — CEFDINIR 300 MG PO CAPS: 300.0000 mg | ORAL_CAPSULE | Freq: Two times a day (BID) | ORAL | 0 refills | Status: DC

## 2017-04-17 MED ORDER — DEXTROSE 5 % IV SOLN
1.0000 g | INTRAVENOUS | Status: DC
  Administered 2017-04-17: 1 g via INTRAVENOUS
  Filled 2017-04-17: qty 10

## 2017-04-17 MED ORDER — APIXABAN 5 MG PO TABS
5.0000 mg | ORAL_TABLET | Freq: Two times a day (BID) | ORAL | Status: DC
  Administered 2017-04-17: 5 mg via ORAL
  Filled 2017-04-17: qty 1

## 2017-04-17 NOTE — Discharge Summary (Signed)
Physician Discharge Summary  LIAH MORR ZOX:[id number] DOB: Aug 14, 1946 DOA: 04/15/2017  PCP: [name], NP  Admit date: 04/15/2017 Discharge date: 04/17/2017  Admitted From: Home Disposition:  SNF at [facility]  Recommendations for Outpatient Follow-up:  1. Follow up with PCP in 1-2 weeks 2. Follow up with Cardiology as an outpatient for 30 day event Monitor 3. Repeat TSH and Free T4 in 6 weeks and discuss with PCP about possible Thyroid U/S 4. Please obtain CMP/CBC, Mag, Phos  in one week 5. Check Lipid Panel as an outpatient  6. Please follow up on the following pending results: Urine Culture Results  Home Health: No Equipment/Devices: None  Discharge Condition: Stable CODE STATUS: FULL CODE Diet recommendation: Heart Healthy/Carb Modified  Brief/Interim Summary: Patient is a a 71 year old female with a PMH of Insulin Dependent Diabetes Mellitus, Gout, HTN, HLD, CKD Stage 2, Anemia and other comorbidswho presented to [facility] with hypoglycemia, weaknessand not eating well along with Nausea and Vomiting. She was found to have an AKI on CKD Stage 2with a BUN/Cr of 147/3.25 and admitted for Dehydration. Abdominal U/S showed no acute abnormality and cortical thinning in both kidneys and Negative for Hydronephrosis. Patient has responding well to IVF Rehydration but had a Rapid Response 7/5 after she had an increased HR from 170-190's and was found to be in A Fib RVR which spontaneously converted back to NSR. She was placed on Heparin gtt as CHADS2VASc was elevated. Cardiology was consulted who recommended 30 day event monitor and starting patient on a NOAC. She improved however she was found to have a UTI as she was complaining of urinary discomfort and burning. She was started on Empiric Ceftriaxone and will be switched to po Cefdinir for a total of 5 days. Patient's TSH was low and Free T4 was elevated and she will need to follow up with PCP to have TSH and Free T4  Repeated to evaluate for Hyperthyroidism. Patient was seen by PT for her weakness and they recommend SNF. At this time she has been deemed medically stable and will be D/C'g to SNF and will need to follow up with PCP and Cardiology as an outpatient. We will stop her Insulin, Lisiniopril, and Metformin for now and can have it resumed/adjusted as an outpatient after kidney function continues to improve.  Discharge Diagnoses:  Principal Problem:   ARF (acute renal failure) (HCC) Active Problems:   Diabetes 1.5, managed as type 1 (HCC)   Essential hypertension   Chronic kidney disease   Anemia   AKI (acute kidney injury) (HCC)   Hypophosphatemia   Hypomagnesemia   Acute lower UTI   Hyperthyroidism  Acute renal failure on chronic kidney disease stage II likely from dehydration and poor oral intake, improving -Patient's BUN/Cr went from 147/3.25 -> 120/2.49 -> 89/2.07 -> 68/1.83  -> 37/1.38 -Was on IVF with NS at 125 mL/hr and was changed to D5W + 20 mEQ of KCl at 75 mL/hr -Urinalysis was not done -Abdominal U/S showed No acute abnormality. There was cortical thinning in both kidneys. Negative for hydronephrosis. -Repeat CMP in AM  Hyperchloremia, improving -Cl went to 118 and now improved to 114  -Changed IVF as above -Repeat CMP as an outpatient   Hypophosphatemia -Replete with IV KPhos 30 mmol prior to D/C -Repeat Phos as an outpatient  Hypomagnesemia -Patient's Mag Level was 1.4 -Replete with IV Mag Sulfate 2 grams -Repeat Mag Level as an outpatient   Hypertension  -Continue to  hold off Antihypertensives for now and Hold Lisinopril 40 mg po Daily until Kidney function improves more  Diabetes Mellitus Type 2  -Was Hypogylcemic on Admission;  -Holding off all antidiabetic medications and checking CBGs every 4 hourly for now. -Will Hold Metformin (Due to Recent Renal Function) and Insulin at D/C (Due to Hypoglycemia) at D/C -Ok to resume Home Glipizide under careful monitor  of BS -CBG's ranging from 156-181 -Last HbA1c was 6.7 in January -Follow up with PCP and defer to them to restart/change medications  Hyperlipidemia -Checked CK levels and was 30 -Check Lipid Panel as an outpatient -Restarted Simvastatin 20 mg po Daily   Normocytic Normochromic Anemia  -Check Anemia Panel and showed Iron Level of 37, UIBC of 209, TIBC of 246, Saturation Ratios of 15, Ferritin of 197. -Patient's Hb/Hct was 9.5/29.7 and went to 9.2/29.0 -Continue to Monitor for S/Sx of Bleeding as patient is on Heparin gtt -Repeat CBC in AM  Paroxysmal Atrial Fibrillation with RVR now in Sinus Brady -TSH was 0.203 and Free T4 was 1.25; ? Hyperthyroid component  -C/w Telemetry; Patient spontaneously converted yesterday -Cardiology Dr. Rachelle HoraMihai Croitoru consulted for further evaluation and Managemnt -Has remained in SR with episodes of Bradycardia; Per Cardiology avoid Rate Controlling Agents -CHADS2-VASc was 4 -Started on Heparin gtt and once Kidney Fxn improves further start NOAC -ECHOCARDIOGRAM done and showed the estimated ejection fraction was in the range of 55% to 60%. Wall motion was normal; there were no regional wall motion abnormalities. Left ventricular diastolic function parameters were normal. -Continue to [place] and appreciated Cardiology Recc's with DOAC and 30 Day Event Monitor   Hyperthyroidism? -TSH was 0.203 and Free T4 was 1.25 -Repeat TSH and Free T4 in 4-6 Weeks and if TSH is still low and Free T4 is High will need Ultrasound of Thyroid to evaluate for Nodules and possible treatment -Outpatient Follow up  UTI -Patient complained of burning and discomfort -Urinalysis showed Many Bacteria, Moderate Leukocytes, Positive Nitrites and 0-5 WBC -Urine Cx Pending and will need to follow up as an outpatient  -Was started on Empiric IV Ceftriaxone and narrowed to po Cefdinir 300 mg po BID x 4 more days for 5 day course  Discharge Instructions  Discharge Instructions     Call MD for:  difficulty breathing, headache or visual disturbances    Complete by:  As directed    Call MD for:  extreme fatigue    Complete by:  As directed    Call MD for:  hives    Complete by:  As directed    Call MD for:  persistant dizziness or light-headedness    Complete by:  As directed    Call MD for:  persistant nausea and vomiting    Complete by:  As directed    Call MD for:  redness, tenderness, or signs of infection (pain, swelling, redness, odor or green/yellow discharge around incision site)    Complete by:  As directed    Call MD for:  severe uncontrolled pain    Complete by:  As directed    Call MD for:  temperature >100.4    Complete by:  As directed    Diet - low sodium heart healthy    Complete by:  As directed    Discharge instructions    Complete by:  As directed    Follow up Care at SNF. Follow up with Cardiology at D/C for 30 day event monitor. Take all medications as prescribed. If symptoms change  or worsen please return to the ED for evaluation.   Increase activity slowly    Complete by:  As directed      Allergies as of 04/17/2017   No Known Allergies     Medication List    STOP taking these medications   insulin lispro protamine-lispro (75-25) 100 UNIT/ML Susp injection Commonly known as:  HUMALOG 75/25 MIX   lisinopril 40 MG tablet Commonly known as:  PRINIVIL,ZESTRIL   metFORMIN 1000 MG tablet Commonly known as:  GLUCOPHAGE   PRODIGY INSULIN SYRINGE 31G X 5/16" 0.5 ML Misc Generic drug:  Insulin Syringe-Needle U-100     TAKE these medications   allopurinol 300 MG tablet Commonly known as:  ZYLOPRIM TAKE 1 TABLET BY MOUTH ONCE A DAY   apixaban 5 MG Tabs tablet Commonly known as:  ELIQUIS Take 1 tablet (5 mg total) by mouth 2 (two) times daily.   B-D SINGLE USE SWABS REGULAR Pads USE AS DIRECTED   cefdinir 300 MG capsule Commonly known as:  OMNICEF Take 1 capsule (300 mg total) by mouth 2 (two) times daily. Start taking on:   04/18/2017   cyanocobalamin 1000 MCG/ML injection Commonly known as:  (VITAMIN B-12) 1 cc monthly What changed:  how much to take  how to take this  when to take this  additional instructions   glipiZIDE 10 MG tablet Commonly known as:  GLUCOTROL TAKE 1 TABLET BY MOUTH TWICE A DAY BEFORE A MEAL   glucose blood test strip Commonly known as:  BAYER CONTOUR TEST 1 each by Other route daily. Dx 250.01   simvastatin 20 MG tablet Commonly known as:  ZOCOR TAKE 1 TABLET BY MOUTH DAILY AT BEDTIME      Follow-up Information    [name], NP. Call in 1 week(s).   Specialty:  Family Medicine Contact information: [address] [place] [address] [phone]        Thurmon Fair, MD Follow up.   Specialty:  Cardiology Why:  Follow up as an outpatient with Dr. Royann Shivers for 30 Day Event Monitor when Discharged from SNF Contact information: [address] [place] [address] [phone]          No Known Allergies  Consultations:  Cardiology Dr. Rachelle Hora Croitoru  Procedures/Studies: Ct Head Wo Contrast  Result Date: 04/15/2017 CLINICAL DATA:  Acute onset of generalized weakness. Initial encounter. EXAM: CT HEAD WITHOUT CONTRAST TECHNIQUE: Contiguous axial images were obtained from the base of the skull through the vertex without intravenous contrast. COMPARISON:  None. FINDINGS: Brain: No evidence of acute infarction, hemorrhage, hydrocephalus, extra-axial collection or mass lesion/mass effect. Prominence of the ventricles and sulci reflects mild to moderate cortical volume loss. Mild cerebellar atrophy is noted. Mild periventricular white matter change likely reflects small vessel ischemic microangiopathy. The brainstem and fourth ventricle are within normal limits. The basal ganglia are unremarkable in appearance. The cerebral hemispheres demonstrate grossly normal gray-white differentiation. No mass effect or midline shift is seen.  Vascular: No hyperdense vessel or unexpected calcification. Skull: There is no evidence of fracture; visualized osseous structures are unremarkable in appearance. Sinuses/Orbits: The orbits are within normal limits. The paranasal sinuses and mastoid air cells are well-aerated. Other: No significant soft tissue abnormalities are seen. IMPRESSION: 1. No acute intracranial pathology seen on CT. 2. Mild to moderate cortical volume loss and scattered small vessel ischemic microangiopathy. Electronically Signed   By: Roanna Raider M.D.   On: 04/15/2017 03:06   US Abdomen Complete  Result Date: 04/15/2017 CLINICAL DATA:  Acute renal failure.  Nausea. EXAM: ABDOMEN ULTRASOUND COMPLETE COMPARISON:  None. FINDINGS: Gallbladder: No gallstones or wall thickening visualized. No sonographic Murphy sign noted by sonographer. Common bile duct: Diameter: 0.5 cm Liver: No focal lesion identified. Within normal limits in parenchymal echogenicity. IVC: No abnormality visualized. Pancreas: Visualized portion unremarkable. Spleen: Size and appearance within normal limits. Right Kidney: Length: 14.6 cm. No hydronephrosis or mass. Marked cortical thinning is noted. Left Kidney: Length: 12.0 cm. No hydronephrosis or mass. Cortex is markedly thinned. Abdominal aorta: No aneurysm visualized. Other findings: None. IMPRESSION: No acute abnormality. Cortical thinning in both kidneys.  Negative for hydronephrosis. Electronically Signed   By: Drusilla Kanner M.D.   On: 04/15/2017 09:27    Subjective: Seen and examined and was feeling better than when she came in. No nausea or vomiting. Still feeling weak. No CP or SOB. Had some urinary discomfort and burning. No other complaints or concerns at this time and ready to go to SNF.   Discharge Exam: Vitals:   04/17/17 0449 04/17/17 0912  BP: (!) 110/36 (!) 125/32  Pulse: (!) 56 (!) 57  Resp: 18 18  Temp: 98.4 F (36.9 C) 98.7 F (37.1 C)   Vitals:   04/16/17 1800 04/16/17 2045  04/17/17 0449 04/17/17 0912  BP: (!) 116/47 (!) 125/47 (!) 110/36 (!) 125/32  Pulse: 80 66 (!) 56 (!) 57  Resp: 18 19 18 18   Temp: 98 F (36.7 C) 98.9 F (37.2 C) 98.4 F (36.9 C) 98.7 F (37.1 C)  TempSrc: Oral Oral Oral Oral  SpO2: 99% 98% 98% 99%  Weight:  94.4 kg (208 lb 1.6 oz)    Height:       General: Pt is alert, awake, not in acute distress Cardiovascular: Bradycardic Rate but Regular Rhythm, S1/S2 +, no rubs, no gallops Respiratory: CTA bilaterally, no wheezing, no rhonchi Abdominal: Soft, NT, ND, bowel sounds + Extremities: no edema, no cyanosis  The results of significant diagnostics from this hospitalization (including imaging, microbiology, ancillary and laboratory) are listed below for reference.    Microbiology: No results found for this or any previous visit (from the past 240 hour(s)).   Labs: BNP (last 3 results) No results for input(s): BNP in the last 8760 hours. Basic Metabolic Panel:  Recent Labs Lab 04/15/17 0300 04/15/17 0542 04/15/17 1031 04/15/17 2125 04/16/17 0542 04/17/17 0433  NA 134*  --  137 142 143 140  K 4.4  --  4.3 4.2 4.0 4.0  CL 102  --  111 116* 118* 114*  CO2 17*  --  16* 18* 18* 19*  GLUCOSE 291*  --  206* 146* 112* 169*  BUN 147*  --  120* 89* 68* 37*  CREATININE 3.25* 2.85* 2.49* 2.07* 1.83* 1.38*  CALCIUM 9.2  --  8.4* 8.7* 8.3* 8.4*  MG  --   --  2.2 2.0 1.9 1.4*  PHOS  --   --  3.8 2.9 2.8 2.1*   Liver Function Tests:  Recent Labs Lab 04/15/17 1031 04/16/17 0542 04/17/17 0433  AST 11* 10* 10*  ALT 8* 7* 6*  ALKPHOS 58 52 44  BILITOT 0.5 0.6 0.6  PROT 5.7* 5.2* 5.3*  ALBUMIN 3.1* 2.9* 2.8*   No results for input(s): LIPASE, AMYLASE in the last 168 hours. No results for input(s): AMMONIA in the last 168 hours. CBC:  Recent Labs Lab 04/15/17 0300 04/15/17 0542 04/16/17 0542 04/17/17 0433  WBC 11.4* 10.5 6.5  7.3  NEUTROABS 9.3*  --  4.2 5.0  HGB 11.4* 10.5* 9.5* 9.2*  HCT 34.1* 32.1* 29.7* 29.0*   MCV 83.4 82.9 85.3 84.5  PLT 204 178 155 163   Cardiac Enzymes:  Recent Labs Lab 04/15/17 1031  CKTOTAL 30*   BNP: Invalid input(s): POCBNP CBG:  Recent Labs Lab 04/16/17 1132 04/16/17 1621 04/16/17 2030 04/17/17 0801 04/17/17 1202  GLUCAP 159* 279* 181* 156* 159*   D-Dimer No results for input(s): DDIMER in the last 72 hours. Hgb A1c No results for input(s): HGBA1C in the last 72 hours. Lipid Profile No results for input(s): CHOL, HDL, LDLCALC, TRIG, CHOLHDL, LDLDIRECT in the last 72 hours. Thyroid function studies  Recent Labs  04/15/17 2125  TSH 0.203*   Anemia work up  Recent Labs  04/15/17 1031  VITAMINB12 453  FOLATE 11.9  FERRITIN 197  TIBC 246*  IRON 37  RETICCTPCT 1.6   Urinalysis    Component Value Date/Time   COLORURINE YELLOW 04/16/2017 2200   APPEARANCEUR HAZY (A) 04/16/2017 2200   LABSPEC 1.013 04/16/2017 2200   PHURINE 5.0 04/16/2017 2200   GLUCOSEU NEGATIVE 04/16/2017 2200   HGBUR NEGATIVE 04/16/2017 2200   HGBUR negative 11/04/2010 0921   BILIRUBINUR NEGATIVE 04/16/2017 2200   BILIRUBINUR n 11/10/2016 0939   KETONESUR NEGATIVE 04/16/2017 2200   PROTEINUR NEGATIVE 04/16/2017 2200   UROBILINOGEN 0.2 11/10/2016 0939   UROBILINOGEN 0.2 11/04/2010 0921   NITRITE POSITIVE (A) 04/16/2017 2200   LEUKOCYTESUR MODERATE (A) 04/16/2017 2200   Sepsis Labs Invalid input(s): PROCALCITONIN,  WBC,  LACTICIDVEN Microbiology No results found for this or any previous visit (from the past 240 hour(s)).  Time coordinating discharge: 35 minutes  SIGNED:  Merlene Laughter, DO [facility] 04/17/2017, 3:46 PM Pager [phone]  If 7PM-7AM, please contact night-coverage [email or web address] Password [email or web address]

## 2017-04-17 NOTE — Progress Notes (Signed)
Progress Note  Patient Name: Gaspar BiddingJudy C Spake Date of Encounter: 04/17/2017  Primary Cardiologist: New (Marybelle Giraldo)  Subjective   Feels well, but did poorly with PT and will need inpatient rehab. No new sustained arrhythmia on telemetry.  Inpatient Medications    Scheduled Meds: . apixaban  5 mg Oral BID  . simvastatin  20 mg Oral QHS   Continuous Infusions: . cefTRIAXone (ROCEPHIN)  IV Stopped (04/17/17 1125)  . dextrose 5 % with KCl 20 mEq / L 20 mEq (04/17/17 1207)  . potassium phosphate IVPB (mmol) 30 mmol (04/17/17 1125)   PRN Meds: acetaminophen **OR** acetaminophen, hydrALAZINE   Vital Signs    Vitals:   04/16/17 1800 04/16/17 2045 04/17/17 0449 04/17/17 0912  BP: (!) 116/47 (!) 125/47 (!) 110/36 (!) 125/32  Pulse: 80 66 (!) 56 (!) 57  Resp: 18 19 18 18   Temp: 98 F (36.7 C) 98.9 F (37.2 C) 98.4 F (36.9 C) 98.7 F (37.1 C)  TempSrc: Oral Oral Oral Oral  SpO2: 99% 98% 98% 99%  Weight:  208 lb 1.6 oz (94.4 kg)    Height:        Intake/Output Summary (Last 24 hours) at 04/17/17 1302 Last data filed at 04/17/17 1040  Gross per 24 hour  Intake          2645.89 ml  Output             1000 ml  Net          1645.89 ml   Filed Weights   04/15/17 0610 04/15/17 2024 04/16/17 2045  Weight: 205 lb 4.8 oz (93.1 kg) 207 lb (93.9 kg) 208 lb 1.6 oz (94.4 kg)    Telemetry    NSR - Personally Reviewed  ECG    No new tracing - Personally Reviewed  Physical Exam  Comfortable at rest. Obese GEN: No acute distress.   Neck: No JVD Cardiac: RRR, no murmurs, rubs, or gallops.  Respiratory: Clear to auscultation bilaterally. GI: Soft, nontender, non-distended  MS: No edema; No deformity. Neuro:  Nonfocal  Psych: Normal affect   Labs    Chemistry Recent Labs Lab 04/15/17 1031 04/15/17 2125 04/16/17 0542 04/17/17 0433  NA 137 142 143 140  K 4.3 4.2 4.0 4.0  CL 111 116* 118* 114*  CO2 16* 18* 18* 19*  GLUCOSE 206* 146* 112* 169*  BUN 120* 89* 68* 37*    CREATININE 2.49* 2.07* 1.83* 1.38*  CALCIUM 8.4* 8.7* 8.3* 8.4*  PROT 5.7*  --  5.2* 5.3*  ALBUMIN 3.1*  --  2.9* 2.8*  AST 11*  --  10* 10*  ALT 8*  --  7* 6*  ALKPHOS 58  --  52 44  BILITOT 0.5  --  0.6 0.6  GFRNONAA 18* 23* 26* 37*  GFRAA 21* 26* 31* 43*  ANIONGAP 10 8 7 7      Hematology Recent Labs Lab 04/15/17 0542 04/15/17 1031 04/16/17 0542 04/17/17 0433  WBC 10.5  --  6.5 7.3  RBC 3.87 3.85* 3.48* 3.43*  HGB 10.5*  --  9.5* 9.2*  HCT 32.1*  --  29.7* 29.0*  MCV 82.9  --  85.3 84.5  MCH 27.1  --  27.3 26.8  MCHC 32.7  --  32.0 31.7  RDW 15.7*  --  16.0* 15.9*  PLT 178  --  155 163    Cardiac Studies   Echo 04/16/2017 - Left ventricle: The cavity size was normal. There was mild  concentric hypertrophy. Systolic function was normal. The   estimated ejection fraction was in the range of 55% to 60%. Wall   motion was normal; there were no regional wall motion   abnormalities. Left ventricular diastolic function parameters   were normal. - Aortic valve: Valve area (VTI): 1.76 cm^2. Valve area (Vmax):   1.75 cm^2. Valve area (Vmean): 1.62 cm^2. - Left atrium: The atrium was mildly dilated. - Pulmonary arteries: PA peak pressure: 32 mm Hg (S).   Patient Profile     71 y.o. female with incidental diagnosis of paroxysmal atrial fibrillation during hospitalization for prerenal acute renal failure.  Assessment & Plan    Started DOAC. Plan 30 day event monitor after DC from rehab, then decide whether lifelong anticoagulation is warranted. It is possible, but less likely, that her arrhythmia was a one time event due to her acute illness.  Signed, Thurmon Fair, MD  04/17/2017, 1:02 PM

## 2017-04-17 NOTE — Clinical Social Work Note (Signed)
Clinical Social Work Assessment  Patient Details  Name: Sherri Pratt MRN: [id number] Date of Birth: 1946-05-08  Date of referral:  04/17/17               Reason for consult:  Facility Placement                Permission sought to share information with:  Family Supports Permission granted to share information::  No (Patient allowed son to actively participate in conversation)  Name::     Sherri Pratt  Agency::     Relationship::  Son  Contact Information:  [phone]  Housing/Transportation Living arrangements for the past 2 months:  Single Family Home Source of Information:  Patient, Adult Children Patient Interpreter Needed:  None Criminal Activity/Legal Involvement Pertinent to Current Situation/Hospitalization:  No - Comment as needed Significant Relationships:  Adult Children, Spouse, Other Family Members Lives with:  Spouse Do you feel safe going back to the place where you live?  No (Patient agreeable to ST rehab due to weakness and limited ability of husband to assist at home) Need for family participation in patient care:  Yes (Comment)  Care giving concerns:  Patient concerns that she is currently weakened due to illness and her husband will be unable to assist her at home.  Social Worker assessment / plan:  CSW talked with patient, husband, son and sister-in-law at the bedside. Sherri Pratt was sitting up in bed and was alert, oriented and participated in discussion regarding her discharge plan. Husband permitted son Barbara CowerJason to lead conversation with CSW. Patient agreeable to SNF for rehab and this was discussed, along with information regarding clothing, etc., as patient has never been to a facility for ST rehab. SNF search process explained and SNF list provided.   Employment status:  Retired Health and safety inspectornsurance information:  [employer] administrator[employer] Medicare (BCBS) PT Recommendations:  Skilled Nursing Facility Information / Referral to community resources:  Skilled Nursing Facility (Patient/family  provided with SNF list)  Patient/Family's Response to care:  No concerns expressed regarding care during hospitalization.  Patient/Family's Understanding of and Emotional Response to Diagnosis, Current Treatment, and Prognosis:  Not discussed.  Emotional Assessment Appearance:  Appears stated age Attitude/Demeanor/Rapport:  Other (Appropriate) Affect (typically observed):  Appropriate, Pleasant Orientation:  Oriented to Self, Oriented to Place, Oriented to  Time, Oriented to Situation Alcohol / Substance use:  Never Used Psych involvement (Current and /or in the community):  No (Comment)  Discharge Needs  Concerns to be addressed:  Discharge Planning Concerns Readmission within the last 30 days:  No Current discharge risk:  None Barriers to Discharge:  Insurance Authorization   Sherri Pratt, Sherri Brownstein Bradley, LCSW 04/17/2017, 11:24 AM

## 2017-04-17 NOTE — Progress Notes (Signed)
Report given to Pam at [facility].

## 2017-04-17 NOTE — Care Management Note (Signed)
Case Management Note  Patient Details  Name: [name] MRN: [id number] Date of Birth: 1946/07/01  Subjective/Objective:                 Anticipate DC to SNF today as facilitated by CSW.   Action/Plan:   Expected Discharge Date:  04/20/17               Expected Discharge Plan:  Skilled Nursing Facility  In-House Referral:  Clinical Social Work  Discharge planning Services  CM Consult  Post Acute Care Choice:    Choice offered to:     DME Arranged:    DME Agency:     HH Arranged:    HH Agency:     Status of Service:  Completed, signed off  If discussed at [employer] Meetings, dates discussed:    Additional Comments:  Lawerance SabalDebbie Sandra Tellefsen, RN 04/17/2017, 11:50 AM

## 2017-04-17 NOTE — Discharge Instructions (Signed)

## 2017-04-17 NOTE — Progress Notes (Addendum)
ANTICOAGULATION CONSULT NOTE  Pharmacy Consult:  Heparin to apixaban Indication: atrial fibrillation  No Known Allergies  Patient Measurements: Height: 5' 5.5" (166.4 cm) Weight: 208 lb 1.6 oz (94.4 kg) IBW/kg (Calculated) : 58.15 Heparin Dosing Weight: 79 kg  Vital Signs: Temp: 98.4 F (36.9 C) (07/06 0449) Temp Source: Oral (07/06 0449) BP: 110/36 (07/06 0449) Pulse Rate: 56 (07/06 0449)  Labs:  Recent Labs  04/15/17 0542 04/15/17 1031 04/15/17 2125 04/16/17 0542 04/16/17 1433 04/17/17 0433  HGB 10.5*  --   --  9.5*  --  9.2*  HCT 32.1*  --   --  29.7*  --  29.0*  PLT 178  --   --  155  --  163  HEPARINUNFRC  --   --   --  0.34 0.44 0.44  CREATININE 2.85* 2.49* 2.07* 1.83*  --  1.38*  CKTOTAL  --  30*  --   --   --   --     Estimated Creatinine Clearance: 42.3 mL/min (A) (by C-G formula based on SCr of 1.38 mg/dL (H)).   Medical History: Past Medical History:  Diagnosis Date  . Anemia   . Chronic kidney disease   . Diabetes mellitus   . Gout   . Hyperlipidemia   . Hypertension     Assessment: [age] YOF with new-onset Afib RVR that converted to NSR.  Given high CHADsVASc score,  Continues on heparin Planning DOAC  Heparin level therapeutic   Goal of Therapy:  Heparin level 0.3-0.7 units/ml Monitor platelets by anticoagulation protocol: Yes   Plan:  Changed to apixaban 5 mg po BID  Thank you Okey RegalLisa Latrail Pounders, PharmD [phone] 04/17/17 8:48 AM

## 2017-04-17 NOTE — Progress Notes (Signed)
Physical Therapy Treatment Patient Details Name: Sherri Pratt MRN: [id number] DOB: 10-16-1945 Today's Date: 04/17/2017    History of Present Illness pt is a 71 y/o female with h/o dm2, HTN, CKD, admitted to ED  increasingly weak and less able to walk  over 2 week period.  She also had decreased oral intake over the same period.  She is admitted for acute renal failure likely from dehydration.  On pm of 04/15/17, pt developed A-Fib with RVR    PT Comments    Pt demonstrates good tolerance for gait this session and bed mobs. Pt continues to require a RW for mobility due to slow cadence and weakness bilaterally. Pt will benefit from SNF at discharge in order to progress mobility and strength before returning home with her husband.     Follow Up Recommendations  SNF     Equipment Recommendations  Rolling walker with 5" wheels    Recommendations for Other Services       Precautions / Restrictions Precautions Precautions: Fall Restrictions Weight Bearing Restrictions: No    Mobility  Bed Mobility Overal bed mobility: Needs Assistance Bed Mobility: Supine to Sit     Supine to sit: Min guard     General bed mobility comments: Min guard for safety   Transfers Overall transfer level: Needs assistance Equipment used: None;Rolling walker (2 wheeled) Transfers: Sit to/from [employer] Sit to Stand: Min guard Stand pivot transfers: Min guard       General transfer comment: Min guard for safety from EOB to Commode and Min guard to sit to stand from commode to RW  Ambulation/Gait Ambulation/Gait assistance: Min guard Ambulation Distance (Feet): 50 Feet Assistive device: Rolling walker (2 wheeled) Gait Pattern/deviations: Step-through pattern;Decreased step length - right;Decreased step length - left Gait velocity: decreased Gait velocity interpretation: Below normal speed for age/gender General Gait Details: decreased step length bilaterally, cues for staying  within walker and taking increased steps in order to improve mobility   Stairs            Wheelchair Mobility    Modified Rankin (Stroke Patients Only)       Balance Overall balance assessment: Needs assistance Sitting-balance support: Feet supported Sitting balance-Leahy Scale: Good     Standing balance support: No upper extremity supported;During functional activity Standing balance-Leahy Scale: Fair                              Cognition Arousal/Alertness: Awake/alert Behavior During Therapy: Flat affect Overall Cognitive Status: Within Functional Limits for tasks assessed                                        Exercises      General Comments        Pertinent Vitals/Pain Pain Assessment: No/denies pain    Home Living                      Prior Function            PT Goals (current goals can now be found in the care plan section) Acute Rehab PT Goals Patient Stated Goal: to go home  Progress towards PT goals: Progressing toward goals    Frequency    Min 3X/week      PT Plan Current plan remains appropriate    Co-evaluation  AM-PAC PT "6 Clicks" Daily Activity  Outcome Measure  Difficulty turning over in bed (including adjusting bedclothes, sheets and blankets)?: None Difficulty moving from lying on back to sitting on the side of the bed? : None Difficulty sitting down on and standing up from a chair with arms (e.g., wheelchair, bedside commode, etc,.)?: Total Help needed moving to and from a bed to chair (including a wheelchair)?: A Little Help needed walking in hospital room?: A Little Help needed climbing 3-5 steps with a railing? : A Lot 6 Click Score: 17    End of Session Equipment Utilized During Treatment: Gait belt Activity Tolerance: Patient tolerated treatment well Patient left: in chair;with call bell/phone within reach;with family/visitor present Nurse Communication:  Mobility status PT Visit Diagnosis: Unsteadiness on feet (R26.81);Muscle weakness (generalized) (M62.81);Other abnormalities of gait and mobility (R26.89)     Time: 1610-96041204-1224 PT Time Calculation (min) (ACUTE ONLY): 20 min  Charges:  $Gait Training: 8-22 mins                    G Codes:       Colin BroachSabra M. Delvecchio Madole PT, DPT  [phone]    Ruel FavorsSabra Aletha HalimMarie Felicite Zeimet 04/17/2017, 1:23 PM

## 2017-04-17 NOTE — Progress Notes (Signed)
Notified MD of u/a results   

## 2017-04-17 NOTE — Clinical Social Work Placement (Addendum)
   CLINICAL SOCIAL WORK PLACEMENT  NOTE 04/17/17 - DISCHARGED TO [facility] VIA FAMILY  Date:  04/17/2017  Patient Details  Name: Sherri Pratt MRN: [id number] Date of Birth: [birth year]  Clinical Social Work is seeking post-discharge placement for this patient at the Skilled  Nursing Facility level of care (*CSW will initial, date and re-position this form in  chart as items are completed):  Yes   Patient/family provided with [facility] Clinical Social Work Department's list of facilities offering this level of care within the geographic area requested by the patient (or if unable, by the patient's family).  Yes   Patient/family informed of their freedom to choose among providers that offer the needed level of care, that participate in Medicare, Medicaid or managed care program needed by the patient, have an available bed and are willing to accept the patient.  Yes   Patient/family informed of [facility]'s ownership interest in [facility] and [facility], as well as of the fact that they are under no obligation to receive care at these facilities.  PASRR submitted to EDS on 04/17/17     PASRR number received on 04/17/17     Existing PASRR number confirmed on       FL2 transmitted to all facilities in geographic area requested by pt/family on 04/17/17     FL2 transmitted to all facilities within larger geographic area on       Patient informed that his/her managed care company has contracts with or will negotiate with certain facilities, including the following:         YES - Patient/family informed of bed offers received.  Patient chooses bed at  [facility]     Physician recommends and patient chooses bed at      Patient to be transferred to  [facility] on  04/17/17.  Patient to be transferred to facility by  family (sons)     Patient family notified on  04/17/17 of transfer.  Name of family member notified:   Barbara CowerJason and another of patient's sons.      PHYSICIAN       Additional Comment:    _______________________________________________ Cristobal Goldmannrawford, Mirakle Tomlin Bradley, LCSW 04/17/2017, 11:29 AM

## 2017-04-17 NOTE — Plan of Care (Signed)
Problem: Physical Regulation: Goal: Will remain free from infection Outcome: Progressing Pt will resume PO ABX for UTI at SNF.

## 2017-04-17 NOTE — Progress Notes (Addendum)
Discharge instructions, medications and follow up appointments discussed and reviewed with pt and pt son, verbalized understanding. Telemetry dc'ed, CCMD notified. IV dc'ed, site clean and dry. Waiting for IV fluids to be completed then pt will be transported to [facility] by son. Pt escorted out of the unit in wheelchair accompanied by sons, took all belongings with her.

## 2017-04-17 NOTE — NC FL2 (Signed)
[place] MEDICAID FL2 LEVEL OF CARE SCREENING TOOL     IDENTIFICATION  Patient Name: Sherri BiddingJudy C Johns Birthdate: 1946/09/20 Sex: female Admission Date (Current Location): 04/15/2017  [facility] [place]   Facility and Address:  [facility] [place], [address], [place], [place] [address]      Provider Number: [id number]  Attending Physician Name and Address:  Merlene LaughterSheikh, Omair Latif, DO  Relative Name and Phone Number:  Sunnie NielsenSilver,Joe - Spouse, [phone] (h), [phone] (w);  Bridget HartshornSilver,Jeff - Son, [phone] (mobile), Matzke,Joey - Son, [phone] (mobile)      Current Level of Care: Hospital Recommended Level of Care: Skilled Nursing Facility Prior Approval Number:    Date Approved/Denied:   PASRR Number: [id number] A (Eff. 04/17/17)  Discharge Plan: SNF    Current Diagnoses: Patient Active Problem List   Diagnosis Date Noted  . AKI (acute kidney injury) (HCC) 04/16/2017  . ARF (acute renal failure) (HCC) 04/15/2017  . Anemia 11/08/2012  . GOUT 11/12/2010  . Chronic kidney disease 03/27/2009  . Diabetes 1.5, managed as type 1 (HCC) 09/21/2007  . Hyperlipidemia 09/21/2007  . Essential hypertension 09/21/2007    Orientation RESPIRATION BLADDER Height & Weight     Self, Time, Situation, Place  Normal Incontinent (External catheter placed 7.4) Weight: 208 lb 1.6 oz (94.4 kg) Height:  5' 5.5" (166.4 cm)  BEHAVIORAL SYMPTOMS/MOOD NEUROLOGICAL BOWEL NUTRITION STATUS      Continent Diet (Heart healthy - carb modified)  AMBULATORY STATUS COMMUNICATION OF NEEDS Skin   Limited Assist (Min guard per PT) Verbally Other (Comment) (Rash bilateral groin)                       Personal Care Assistance Level of Assistance  Bathing, Feeding, Dressing Bathing Assistance:  (Upper body-assistance with set-up; Lower body min assist) Feeding assistance: Independent Dressing Assistance: Limited assistance     Functional Limitations Info  Sight,  Hearing, Speech Sight Info: Impaired Hearing Info: Adequate Speech Info: Adequate    SPECIAL CARE FACTORS FREQUENCY  PT (By licensed PT), OT (By licensed OT)     PT Frequency: Evaluation 7/4 and a minimum of 3X per week therapy recommended OT Frequency: Evaluated 7/5 and a minimum of 2X per week therapy recommended            Contractures Contractures Info: Not present    Additional Factors Info  Code Status, Allergies Code Status Info: Full Allergies Info: No known allergies           Current Medications (04/17/2017):  This is the current hospital active medication list Current Facility-Administered Medications  Medication Dose Route Frequency Provider Last Rate Last Dose  . acetaminophen (TYLENOL) tablet 650 mg  650 mg Oral Q6H PRN Eduard ClosKakrakandy, Arshad N, MD       Or  . acetaminophen (TYLENOL) suppository 650 mg  650 mg Rectal Q6H PRN Eduard ClosKakrakandy, Arshad N, MD      . apixaban [facility](ELIQUIS) tablet 5 mg  5 mg Oral BID Sheikh, Omair Latif, DO      . cefTRIAXone (ROCEPHIN) 1 g in dextrose 5 % 50 mL IVPB  1 g Intravenous Q24H SheikhKateri Mc, Omair Latif, DO 100 mL/hr at 04/17/17 1046 1 g at 04/17/17 1046  . dextrose 5 % with KCl 20 mEq / L  infusion  20 mEq Intravenous Continuous Marguerita MerlesSheikh, Omair PenfieldLatif, DO   Stopped at 04/17/17 1040  . hydrALAZINE (APRESOLINE) injection 10 mg  10 mg Intravenous Q4H PRN  Eduard Clos, MD      . potassium phosphate 30 mmol in dextrose 5 % 500 mL infusion  30 mmol Intravenous Once Sheikh, Omair Latif, DO      . simvastatin (ZOCOR) tablet 20 mg  20 mg Oral QHS Sheikh, Kateri Mc [place], DO   20 mg at 04/16/17 2102     Discharge Medications: Please see discharge summary for a list of discharge medications.  Relevant Imaging Results:  Relevant Lab Results:   Additional Information ss#[id number].  Cristobal Goldmann, LCSW

## 2017-04-17 NOTE — Progress Notes (Signed)
Occupational Therapy Treatment Patient Details Name: Sherri BiddingJudy C Pratt MRN: [id number] DOB: Oct 16, 1945 Today's Date: 04/17/2017    History of present illness pt is a 71 y/o female with h/o dm2, HTN, CKD, admitted to ED  increasingly weak and less able to walk  over 2 week period.  She also had decreased oral intake over the same period.  She is admitted for acute renal failure likely from dehydration.  On pm of 04/15/17, pt developed A-Fib with RVR   OT comments  Pt progressing towards goals, showing increased stability during functional mobility and motivation to participate in therapy session. Pt reporting having completed ADLs earlier, initiating completing hallway functional mobility with RW and MinGuard assist throughout for approx 50 ft. Pt completed standing grooming ADLs with minguard assist after return to room. Min verbal cues throughout session for safety and safe use of RW. Pt continues to demonstrate slow movements and limited mostly by decreased endurance/activity tolerance. Pt will benefit from continued acute OT services; discharge plan remains appropriate for additional OT services in SNF setting prior to return home to maximize Pt's activity tolerance, safety and independence for ADLs and functional mobility prior to return home.    Follow Up Recommendations  SNF    Equipment Recommendations  3 in 1 bedside commode          Precautions / Restrictions Precautions Precautions: Fall Restrictions Weight Bearing Restrictions: No       Mobility Bed Mobility Overal bed mobility: Needs Assistance Bed Mobility: Supine to Sit;Sit to Supine     Supine to sit: Min assist Sit to supine: Mod assist   General bed mobility comments: MinA for trunk support to come to sitting; ModA for LE management when returning to supine, +2 for scooting to [facility]  Transfers Overall transfer level: Needs assistance Equipment used: Rolling walker (2 wheeled) Transfers: Sit to/from Stand Sit to  Stand: Min guard Stand pivot transfers: Min guard       General transfer comment: Close guard for safety throughout, Pt taking small, slow steps during hallway functional mobility     Balance Overall balance assessment: Needs assistance Sitting-balance support: Feet supported Sitting balance-Leahy Scale: Good     Standing balance support: During functional activity Standing balance-Leahy Scale: Fair Standing balance comment: washing hands at sink with Min guard for safety                            ADL either performed or assessed with clinical judgement   ADL Overall ADL's : Needs assistance/impaired     Grooming: Wash/dry hands;Min guard;Standing                               Functional mobility during ADLs: Min guard;Rolling walker General ADL Comments: Pt reporting having completed ADLs earlier today, agreeable to standing grooming ADLs and hallway level functional mobility, completing each with MinGuard assist using RW, min verbal cues for safety and safe use of RW during task completion; education provided to Pt and family on expectations [facility]                        Cognition Arousal/Alertness: Awake/alert Behavior During Therapy: Flat affect Overall Cognitive Status: Within Functional Limits for tasks assessed Area of Impairment: Safety/judgement  Safety/Judgement: Decreased awareness of safety     General Comments: Pt standing prior to therapist placing RW in front of her, requires min verbal cues for safe RW use during standing grooming ADLs                    General Comments      Pertinent Vitals/ Pain       Pain Assessment: No/denies pain                                                          Frequency  Min 2X/week        Progress Toward Goals  OT Goals(current goals can now be found in the care plan section)   Progress towards OT goals: Progressing toward goals  Acute Rehab OT Goals Patient Stated Goal: to go home  OT Goal Formulation: With patient Time For Goal Achievement: 04/30/17 Potential to Achieve Goals: Good  Plan Discharge plan remains appropriate                     AM-PAC PT "6 Clicks" Daily Activity     Outcome Measure   Help from another person eating meals?: None Help from another person taking care of personal grooming?: A Little Help from another person toileting, which includes using toliet, bedpan, or urinal?: A Little Help from another person bathing (including washing, rinsing, drying)?: A Little Help from another person to put on and taking off regular upper body clothing?: A Little Help from another person to put on and taking off regular lower body clothing?: A Little 6 Click Score: 19    End of Session Equipment Utilized During Treatment: Gait belt;Rolling walker  OT Visit Diagnosis: Unsteadiness on feet (R26.81)   Activity Tolerance Patient tolerated treatment well   Patient Left in bed;with call bell/phone within reach;with family/visitor present             Time: 1610-9604 OT Time Calculation (min): 17 min  Charges: OT General Charges $OT Visit: 1 Procedure OT Treatments $Self Care/Home Management : 8-22 mins  Marcy Siren, OT Pager [phone] 04/17/2017    Sherri Pratt 04/17/2017, 3:58 PM

## 2017-04-18 LAB — T3, FREE: T3, Free: 1.7 pg/mL — ABNORMAL LOW (ref 2.0–4.4)

## 2017-04-19 DIAGNOSIS — E1121 Type 2 diabetes mellitus with diabetic nephropathy: Secondary | ICD-10-CM | POA: Diagnosis not present

## 2017-04-19 DIAGNOSIS — R2689 Other abnormalities of gait and mobility: Secondary | ICD-10-CM | POA: Diagnosis not present

## 2017-04-19 DIAGNOSIS — M6281 Muscle weakness (generalized): Secondary | ICD-10-CM | POA: Diagnosis not present

## 2017-04-19 DIAGNOSIS — N178 Other acute kidney failure: Secondary | ICD-10-CM | POA: Diagnosis not present

## 2017-04-19 LAB — URINE CULTURE: Culture: >=100000 — AB

## 2017-05-06 ENCOUNTER — Ambulatory Visit (INDEPENDENT_AMBULATORY_CARE_PROVIDER_SITE_OTHER): Payer: Medicare Other | Admitting: Adult Health

## 2017-05-06 VITALS — BP 112/78 | HR 91 | Temp 97.8°F | Wt 194.0 lb

## 2017-05-06 DIAGNOSIS — E079 Disorder of thyroid, unspecified: Secondary | ICD-10-CM | POA: Diagnosis not present

## 2017-05-06 DIAGNOSIS — I10 Essential (primary) hypertension: Secondary | ICD-10-CM

## 2017-05-06 DIAGNOSIS — E0590 Thyrotoxicosis, unspecified without thyrotoxic crisis or storm: Secondary | ICD-10-CM

## 2017-05-06 DIAGNOSIS — N179 Acute kidney failure, unspecified: Secondary | ICD-10-CM | POA: Diagnosis not present

## 2017-05-06 LAB — COMPREHENSIVE METABOLIC PANEL
ALBUMIN: 3.9 g/dL (ref 3.5–5.2)
ALK PHOS: 74 U/L (ref 39–117)
ALT: 13 U/L (ref 0–35)
AST: 16 U/L (ref 0–37)
BILIRUBIN TOTAL: 0.5 mg/dL (ref 0.2–1.2)
BUN: 14 mg/dL (ref 6–23)
CO2: 24 mEq/L (ref 19–32)
Calcium: 9.4 mg/dL (ref 8.4–10.5)
Chloride: 108 mEq/L (ref 96–112)
Creatinine, Ser: 1.44 mg/dL — ABNORMAL HIGH (ref 0.40–1.20)
GFR: 37.98 mL/min — ABNORMAL LOW (ref >60.00)
GLUCOSE: 90 mg/dL (ref 70–99)
POTASSIUM: 3.9 meq/L (ref 3.5–5.1)
SODIUM: 140 meq/L (ref 135–145)
TOTAL PROTEIN: 6.5 g/dL (ref 6.0–8.3)

## 2017-05-06 LAB — CBC WITH DIFFERENTIAL/PLATELET
BASOS ABS: 0 10*3/uL (ref 0.0–0.1)
Basophils Relative: 0.7 % (ref 0.0–3.0)
EOS PCT: 3.4 % (ref 0.0–5.0)
Eosinophils Absolute: 0.2 10*3/uL (ref 0.0–0.7)
HCT: 35.1 % — ABNORMAL LOW (ref 36.0–46.0)
HEMOGLOBIN: 11.3 g/dL — AB (ref 12.0–15.0)
LYMPHS ABS: 1.8 10*3/uL (ref 0.7–4.0)
Lymphocytes Relative: 25.3 % (ref 12.0–46.0)
MCHC: 32.2 g/dL (ref 30.0–36.0)
MCV: 87 fl (ref 78.0–100.0)
MONO ABS: 0.5 10*3/uL (ref 0.1–1.0)
Monocytes Relative: 7 % (ref 3.0–12.0)
NEUTROS PCT: 63.6 % (ref 43.0–77.0)
Neutro Abs: 4.5 10*3/uL (ref 1.4–7.7)
Platelets: 251 10*3/uL (ref 150.0–400.0)
RBC: 4.03 Mil/uL (ref 3.87–5.11)
RDW: 17.3 % — ABNORMAL HIGH (ref 11.5–15.5)
WBC: 7 10*3/uL (ref 4.0–10.5)

## 2017-05-06 LAB — TSH: TSH: 0.95 u[IU]/mL (ref 0.35–4.50)

## 2017-05-06 LAB — T4, FREE: FREE T4: 0.98 ng/dL (ref 0.60–1.60)

## 2017-05-06 LAB — MAGNESIUM: MAGNESIUM: 1.9 mg/dL (ref 1.5–2.5)

## 2017-05-06 LAB — T3, FREE: T3 FREE: 3 pg/mL (ref 2.3–4.2)

## 2017-05-06 NOTE — Addendum Note (Signed)
Addended by: Nancy FetterNAFZIGER, Maha Fischel L on: 05/06/2017 04:20 PM   Modules accepted: Orders

## 2017-05-06 NOTE — Patient Instructions (Signed)
It was great seeing you and I am happy you are feeling better.   I will follow up with you regarding your blood work. Someone will call yo to schedule your thyroid ultrasound  Please let me know if you need anything

## 2017-05-06 NOTE — Progress Notes (Signed)
Subjective:    Patient ID: Sherri Pratt, female    DOB: 01/08/1946, 71 y.o.   MRN: [id number]  HPI  71 year old female who  has a past medical history of Anemia; Chronic kidney disease; Diabetes mellitus; Gout; Hyperlipidemia; and Hypertension.   She presents to the office today for follow up after being discharged from SNF  Per ER note:    She was found to have an AKI on CKD Stage 2with a BUN/Cr of 147/3.25 and admitted for Dehydration. Abdominal U/S showed no acute abnormality and cortical thinning in both kidneys and Negative for Hydronephrosis. Patient has responding well to IVF Rehydration but had a Rapid Response 7/5 after she had an increased HR from 170-190's and was found to be in A Fib RVR which spontaneously converted back to NSR. She was placed on Heparin gtt as CHADS2VASc was elevated. Cardiology was consulted who recommended 30 day event monitor and starting patient on a NOAC. She improved however she was found to have a UTI as she was complaining of urinary discomfort and burning. She was started on Empiric Ceftriaxone and will be switched to po Cefdinir for a total of 5 days. Patient's TSH was low and Free T4 was elevated and she will need to follow up with PCP to have TSH and Free T4 Repeated to evaluate for Hyperthyroidism. Patient was seen by PT for her weakness and they recommend SNF. At this time she has been deemed medically stable and will be D/C'g to SNF and will need to follow up with PCP and Cardiology as an outpatient. We will stop her Insulin, Lisiniopril, and Metformin for now and can have it resumed/adjusted as an outpatient after kidney function continues to improve.  Recommendations for Outpatient Follow-up:  1. Follow up with PCP in 1-2 weeks 2. Follow up with Cardiology as an outpatient for 30 day event Monitor 3. Repeat TSH and Free T4 in 6 weeks and discuss with PCP about possible Thyroid U/S 4. Please obtain CMP/CBC, Mag, Phos  in one week 5. Check Lipid  Panel as an outpatient  6. Please follow up on the following pending results: Urine Culture Results  Since being discharged from the SNF she reports that " I feel really good". She denies any chest pain, SOB, or UTI like symptoms. She reports that she is staying hydrated and eating healthy. Her blood sugars have been well controlled at home.   Review of Systems  Constitutional: Negative.   HENT: Negative.   Respiratory: Negative.   Cardiovascular: Negative.   Gastrointestinal: Negative.   Genitourinary: Negative.   Neurological: Negative.   Hematological: Negative.   All other systems reviewed and are negative.  Past Medical History:  Diagnosis Date  . Anemia   . Chronic kidney disease   . Diabetes mellitus   . Gout   . Hyperlipidemia   . Hypertension     Social History   Social History  . Marital status: Married    Spouse name: N/A  . Number of children: N/A  . Years of education: N/A   Occupational History  . Not on file.   Social History Main Topics  . Smoking status: Never Smoker  . Smokeless tobacco: Never Used  . Alcohol use No  . Drug use: No  . Sexual activity: Not on file   Other Topics Concern  . Not on file   Social History Narrative   Retired - She Forensic psychologistsold insurance   Married for 51 years  Three children - all in [place]      She likes to quilting and shopping.     Past Surgical History:  Procedure Laterality Date  . APPENDECTOMY      Family History  Problem Relation Age of Onset  . Cancer Mother        colon  . Hypertension Mother   . Cancer Father        colon    No Known Allergies  Current Outpatient Prescriptions on File Prior to Visit  Medication Sig Dispense Refill  . Alcohol Swabs (B-D SINGLE USE SWABS REGULAR) PADS USE AS DIRECTED 100 each 1  . allopurinol (ZYLOPRIM) 300 MG tablet TAKE 1 TABLET BY MOUTH ONCE A DAY 90 tablet 3  . apixaban (ELIQUIS) 5 MG TABS tablet Take 1 tablet (5 mg total) by mouth 2 (two) times daily. 60  tablet 0  . cefdinir (OMNICEF) 300 MG capsule Take 1 capsule (300 mg total) by mouth 2 (two) times daily. 8 capsule 0  . cyanocobalamin (,VITAMIN B-12,) 1000 MCG/ML injection 1 cc monthly (Patient taking differently: Inject 1,000 mcg into the muscle every 30 (thirty) days. ) 30 mL 1  . glipiZIDE (GLUCOTROL) 10 MG tablet TAKE 1 TABLET BY MOUTH TWICE A DAY BEFORE A MEAL 180 tablet 1  . glucose blood (BAYER CONTOUR TEST) test strip 1 each by Other route daily. Dx 250.01 100 each 12  . simvastatin (ZOCOR) 20 MG tablet TAKE 1 TABLET BY MOUTH DAILY AT BEDTIME 90 tablet 3   No current facility-administered medications on file prior to visit.     BP 112/78 (BP Location: Left Arm, Patient Position: Sitting, Cuff Size: Normal)   Pulse 91   Temp 97.8 F (36.6 C) (Oral)   Wt 194 lb (88 kg)   SpO2 98%   BMI 31.79 kg/m       Objective:   Physical Exam  Constitutional: She is oriented to person, place, and time. She appears well-developed and well-nourished. No distress.  Neck: Normal range of motion. Neck supple. No thyromegaly present.  Cardiovascular: Normal rate, regular rhythm, normal heart sounds and intact distal pulses.  Exam reveals no gallop and no friction rub.   No murmur heard. Pulmonary/Chest: Effort normal and breath sounds normal. No respiratory distress. She has no wheezes. She has no rales. She exhibits no tenderness.  Abdominal: Soft. Bowel sounds are normal. She exhibits no distension and no mass. There is no tenderness. There is no rebound and no guarding.  Lymphadenopathy:    She has no cervical adenopathy.  Neurological: She is alert and oriented to person, place, and time.  Skin: Skin is warm and dry. No rash noted. She is not diaphoretic. No erythema. No pallor.  Psychiatric: She has a normal mood and affect. Her behavior is normal. Judgment and thought content normal.  Nursing note and vitals reviewed.     Assessment & Plan:  1. Essential hypertension - well  controlled.  - CMP  2. Hyperthyroidism  - TSH - T3, Free - T4, Free - US THYROID; Future  3. AKI (acute kidney injury) (HCC)  - CBC with Differential/Platelet - CMP  4. Hypomagnesemia  - Magnesium  Shirline Freesory Jessi Jessop, NP

## 2017-05-11 ENCOUNTER — Ambulatory Visit (INDEPENDENT_AMBULATORY_CARE_PROVIDER_SITE_OTHER): Payer: Medicare Other | Admitting: Cardiology

## 2017-05-11 ENCOUNTER — Ambulatory Visit (INDEPENDENT_AMBULATORY_CARE_PROVIDER_SITE_OTHER): Payer: Medicare Other

## 2017-05-11 ENCOUNTER — Encounter: Payer: Self-pay | Admitting: Cardiology

## 2017-05-11 VITALS — BP 140/78 | HR 82 | Ht 65.5 in | Wt 196.8 lb

## 2017-05-11 DIAGNOSIS — I480 Paroxysmal atrial fibrillation: Secondary | ICD-10-CM

## 2017-05-11 DIAGNOSIS — Z79899 Other long term (current) drug therapy: Secondary | ICD-10-CM

## 2017-05-11 NOTE — Progress Notes (Signed)
05/11/2017 Sherri FraiseJudy C Vonbehren   Feb 02, 1946  [id number]  Primary Physician [name], [name], NP Primary Cardiologist: Dr. [name]    Reason for Visit/CC: [facility] f/u for Atrial Fibrillation   HPI:  Sherri BiddingJudy C Pratt is a 71 y.o. female who is being seen today for the evaluation of post hospital f/u. She has a h/o Insulin Dependent Diabetes Mellitus, Gout, HTN, HLD, CKD Stage 2, and anemia. She was [facility] for nausea, vomiting and poor PO intake, and was found to have pre-renal AKI with increase in SCr up to the 3 range. This was 2/2 dehydration and she was treated with IVFs. SCr improved. She was also noted to have a UTI and was treated with antibiotics. During her hospitalization, she was noted to have new onset atrial fibrillation w/ RVR and spontaneously converted back to NSR. 2D echo showed normal LVEF. Her arrhythmia was felt secondary to her AKI, UTI and dehydration. However she was placed on Eliquis, 5 mg BID, for stroke prophylaxis. She was seen by Dr. [name] who recommended outpatient 30 day monitor to assess for recurrent atrial fibrillation to see if she would require long term OAC.   Today in clinic she reports improvement in symptoms. No recurrent n/v. She reports good PO intake. No CP or dyspnea. She did feel brief period of tachy palpitations the other night that lasted only few seconds. She reports full compliance with Eliquis. She was constipated the other day and had a large BM, after which she notice a little bit of hematochezia, however she denies any further recurrence. No falls.   Current Meds  Medication Sig  . Alcohol Swabs (B-D SINGLE USE SWABS REGULAR) PADS USE AS DIRECTED  . allopurinol (ZYLOPRIM) 300 MG tablet TAKE 1 TABLET BY MOUTH ONCE A DAY  . apixaban (ELIQUIS) 5 MG TABS tablet Take 1 tablet (5 mg total) by mouth 2 (two) times daily.  . cefdinir (OMNICEF) 300 MG capsule Take 1 capsule (300 mg total) by mouth 2 (two) times daily.  . cyanocobalamin  (,VITAMIN B-12,) 1000 MCG/ML injection Inject 1,000 mcg into the muscle every 30 (thirty) days.  [name]. glipiZIDE (GLUCOTROL) 10 MG tablet TAKE 1 TABLET BY MOUTH TWICE A DAY BEFORE A MEAL  . glucose blood (BAYER CONTOUR TEST) test strip 1 each by Other route daily. Dx 250.01  . Multiple Vitamins-Minerals (PRESERVISION AREDS 2) CAPS Take 1 capsule by mouth 2 (two) times daily.  . simvastatin (ZOCOR) 20 MG tablet TAKE 1 TABLET BY MOUTH DAILY AT BEDTIME   Allergies  Allergen Reactions  . Peanut-Containing Drug Products    Past Medical History:  Diagnosis Date  . Anemia   . Chronic kidney disease   . Diabetes mellitus   . Gout   . Hyperlipidemia   . Hypertension    Family History  Problem Relation Age of Onset  . Cancer Mother        colon  . Hypertension Mother   . Cancer Father        colon   Past Surgical History:  Procedure Laterality Date  . APPENDECTOMY     Social History   Social History  . Marital status: Married    Spouse name: N/A  . Number of children: N/A  . Years of education: N/A   Occupational History  . Not on file.   Social History Main Topics  . Smoking status: Never Smoker  . Smokeless tobacco: Never Used  . Alcohol use No  . Drug use:  No  . Sexual activity: Not on file   Other Topics Concern  . Not on file   Social History Narrative   Retired - She Forensic psychologistsold insurance   Married for 51 years    Three children - all in [place]      She likes to quilting and shopping.      Review of Systems: General: negative for chills, fever, night sweats or weight changes.  Cardiovascular: negative for chest pain, dyspnea on exertion, edema, orthopnea, palpitations, paroxysmal nocturnal dyspnea or shortness of breath Dermatological: negative for rash Respiratory: negative for cough or wheezing Urologic: negative for hematuria Abdominal: negative for nausea, vomiting, diarrhea, bright red blood per rectum, melena, or hematemesis Neurologic: negative for visual  changes, syncope, or dizziness All other systems reviewed and are otherwise negative except as noted above.   Physical Exam:  Blood pressure 140/78, pulse 82, height 5' 5.5" (1.664 m), weight 196 lb 12.8 oz (89.3 kg).  General appearance: alert, cooperative and no distress Neck: no carotid bruit and no JVD Lungs: clear to auscultation bilaterally Heart: regular rate and rhythm, S1, S2 normal, no murmur, click, rub or gallop Extremities: extremities normal, atraumatic, no cyanosis or edema Pulses: 2+ and symmetric Skin: Skin color, texture, turgor normal. No rashes or lesions Neurologic: Grossly normal  EKG not performed -- personally reviewed   ASSESSMENT AND PLAN:   1. Atrial Fibrillation: recent occurrence was in the setting of dehydration, AKI and UTI. ? If this was an isolated event given her underlying illness. 2D echo showed normal LVEF. It has been recommended by Dr. [name] that we set up with a 30 day monitor to assess for recurrent afib. For now continue Eliquis for a/c. If no recurrent atrial fibrillation, then we can consider discontinuing Eliquis. We will check a f/u CBC today to ensure H/H is stable. She is scheduled to get 30 day monitor set up today. F/u in 6 weeks to review monitor results.     [name] PA-C, MHS [facility] 05/11/2017 3:18 PM

## 2017-05-11 NOTE — Patient Instructions (Signed)
Medication Instructions:  Your physician recommends that you continue on your current medications as directed. Please refer to the Current Medication list given to you today.   Labwork: Your physician recommends that you return for lab work today for CBC   Testing/Procedures: None Ordered   Follow-Up: Your physician recommends that you schedule a follow-up appointment in: 6 weeks with Robbie LisBrittainy Simmons, PA-C  Any Other Special Instructions Will Be Listed Below (If Applicable).     If you need a refill on your cardiac medications before your next appointment, please call your pharmacy.

## 2017-05-12 DIAGNOSIS — I480 Paroxysmal atrial fibrillation: Secondary | ICD-10-CM | POA: Diagnosis not present

## 2017-05-12 LAB — CBC
Hematocrit: 33.9 % — ABNORMAL LOW (ref 34.0–46.6)
Hemoglobin: 11.5 g/dL (ref 11.1–15.9)
MCH: 27.3 pg (ref 26.6–33.0)
MCHC: 33.9 g/dL (ref 31.5–35.7)
MCV: 81 fL (ref 79–97)
PLATELETS: 244 10*3/uL (ref 150–379)
RBC: 4.21 x10E6/uL (ref 3.77–5.28)
RDW: 16.7 % — AB (ref 12.3–15.4)
WBC: 8.7 10*3/uL (ref 3.4–10.8)

## 2017-05-15 ENCOUNTER — Encounter: Payer: Self-pay | Admitting: Cardiovascular Disease

## 2017-05-15 ENCOUNTER — Telehealth: Payer: Self-pay | Admitting: Cardiovascular Disease

## 2017-05-15 MED ORDER — METOPROLOL TARTRATE 25 MG PO TABS: 25.0000 mg | ORAL_TABLET | Freq: Two times a day (BID) | ORAL | 6 refills | Status: DC

## 2017-05-15 NOTE — Telephone Encounter (Signed)
New Message  Sherri JacobsonHelen voiced calling to report an abnormal EKG

## 2017-05-15 NOTE — Telephone Encounter (Signed)
Received a phone call from [employer] calling to report at 1:06 pm today EKG revealed AFib at rate 190.EKG strip shown to Dr.Croitoru he advised make sure pt is taking Eliquis.Advised to start Metoprolol 25 mg twice a day.Patient is taking Eliquis 5 mg twice a day.Advised to keep follow up appointment with Robbie LisBrittainy Simmons PA 06/25/17 at 10:30 am [facility].

## 2017-05-26 ENCOUNTER — Encounter: Payer: Self-pay | Admitting: Adult Health

## 2017-05-26 ENCOUNTER — Ambulatory Visit (INDEPENDENT_AMBULATORY_CARE_PROVIDER_SITE_OTHER): Payer: Medicare Other | Admitting: Adult Health

## 2017-05-26 VITALS — BP 142/62 | Temp 97.8°F | Ht 65.5 in | Wt 194.0 lb

## 2017-05-26 DIAGNOSIS — E109 Type 1 diabetes mellitus without complications: Secondary | ICD-10-CM

## 2017-05-26 DIAGNOSIS — E139 Other specified diabetes mellitus without complications: Secondary | ICD-10-CM

## 2017-05-26 LAB — POCT GLYCOSYLATED HEMOGLOBIN (HGB A1C): HEMOGLOBIN A1C: 6.2

## 2017-05-26 MED ORDER — FREESTYLE LANCETS MISC: 3 refills | Status: DC

## 2017-05-26 NOTE — Progress Notes (Signed)
Subjective:    Patient ID: Sherri Pratt, female    DOB: 19-Dec-1945, 71 y.o.   MRN: [id number]  HPI  71 year old female who  has a past medical history of Anemia; Chronic kidney disease; Diabetes mellitus; Gout; Hyperlipidemia; and Hypertension. She presents to the office today for 6 month follow up regarding diabetes. Her last A1c was 6.7 . She is currently controlled on Glipizide 10 mg.  Today in the office she reports that her blood sugars have been in the low 100's.   She denies any highs or lows.   She has been working on diet and is staying active    Review of Systems  Constitutional: Negative.   Respiratory: Negative.   Cardiovascular: Negative.   Gastrointestinal: Negative.   Genitourinary: Negative.   Neurological: Negative.   All other systems reviewed and are negative.  Past Medical History:  Diagnosis Date  . Anemia   . Chronic kidney disease   . Diabetes mellitus   . Gout   . Hyperlipidemia   . Hypertension     Social History   Social History  . Marital status: Married    Spouse name: N/A  . Number of children: N/A  . Years of education: N/A   Occupational History  . Not on file.   Social History Main Topics  . Smoking status: Never Smoker  . Smokeless tobacco: Never Used  . Alcohol use No  . Drug use: No  . Sexual activity: Not on file   Other Topics Concern  . Not on file   Social History Narrative   Retired - She Forensic psychologist   Married for 51 years    Three children - all in [place]      She likes to quilting and shopping.     Past Surgical History:  Procedure Laterality Date  . APPENDECTOMY      Family History  Problem Relation Age of Onset  . Cancer Mother        colon  . Hypertension Mother   . Cancer Father        colon    Allergies  Allergen Reactions  . Peanut-Containing Drug Products     Current Outpatient Prescriptions on File Prior to Visit  Medication Sig Dispense Refill  . Alcohol Swabs (B-D SINGLE USE SWABS  REGULAR) PADS USE AS DIRECTED 100 each 1  . allopurinol (ZYLOPRIM) 300 MG tablet TAKE 1 TABLET BY MOUTH ONCE A DAY 90 tablet 3  . apixaban (ELIQUIS) 5 MG TABS tablet Take 1 tablet (5 mg total) by mouth 2 (two) times daily. 60 tablet 0  . cyanocobalamin (,VITAMIN B-12,) 1000 MCG/ML injection Inject 1,000 mcg into the muscle every 30 (thirty) days.    [name] glipiZIDE (GLUCOTROL) 10 MG tablet TAKE 1 TABLET BY MOUTH TWICE A DAY BEFORE A MEAL 180 tablet 1  . glucose blood (BAYER CONTOUR TEST) test strip 1 each by Other route daily. Dx 250.01 100 each 12  . metoprolol tartrate (LOPRESSOR) 25 MG tablet Take 1 tablet (25 mg total) by mouth 2 (two) times daily. 60 tablet 6  . Multiple Vitamins-Minerals (PRESERVISION AREDS 2) CAPS Take 1 capsule by mouth 2 (two) times daily.    . simvastatin (ZOCOR) 20 MG tablet TAKE 1 TABLET BY MOUTH DAILY AT BEDTIME 90 tablet 3   No current facility-administered medications on file prior to visit.     BP (!) 142/62 (BP Location: Right Arm)   Temp 97.8 F (36.6 C) (  Oral)   Ht 5' 5.5" (1.664 m)   Wt 194 lb (88 kg)   BMI 31.79 kg/m       Objective:   Physical Exam  Constitutional: She is oriented to person, place, and time. She appears well-developed and well-nourished. No distress.  Cardiovascular: Normal rate, regular rhythm, normal heart sounds and intact distal pulses.  Exam reveals no gallop and no friction rub.   No murmur heard. Currently wearing a 30 day event monitor   Pulmonary/Chest: Effort normal and breath sounds normal. No respiratory distress. She has no wheezes. She has no rales. She exhibits no tenderness.  Neurological: She is alert and oriented to person, place, and time.  Skin: Skin is warm and dry. No rash noted. She is not diaphoretic. No erythema. No pallor.  Psychiatric: She has a normal mood and affect. Her behavior is normal. Judgment and thought content normal.  Nursing note and vitals reviewed.     Assessment & Plan:  1. Diabetes  1.5, managed as type 1 (HCC) - POCT A1C- 6.2. Has improved from 6.7 - Lancets (FREESTYLE) lancets; Use as instructed  Dispense: 100 each; Refill: 3 - Follow up in 6 months and will recheck at CPE  - Follow up with increasing blood sugars - Continue to eat healthy and exercise  [name], NP

## 2017-06-25 ENCOUNTER — Encounter: Payer: Self-pay | Admitting: Cardiology

## 2017-06-25 ENCOUNTER — Encounter (INDEPENDENT_AMBULATORY_CARE_PROVIDER_SITE_OTHER): Payer: Self-pay

## 2017-06-25 ENCOUNTER — Ambulatory Visit (INDEPENDENT_AMBULATORY_CARE_PROVIDER_SITE_OTHER): Payer: Medicare Other | Admitting: Cardiology

## 2017-06-25 VITALS — BP 140/78 | HR 62 | Ht 65.5 in | Wt 197.4 lb

## 2017-06-25 DIAGNOSIS — I480 Paroxysmal atrial fibrillation: Secondary | ICD-10-CM

## 2017-06-25 MED ORDER — APIXABAN 5 MG PO TABS: 5.0000 mg | ORAL_TABLET | Freq: Two times a day (BID) | ORAL | 1 refills | Status: DC

## 2017-06-25 NOTE — Patient Instructions (Signed)
Medication Instructions:  Your physician recommends that you continue on your current medications as directed. Please refer to the Current Medication list given to you today.  We have sent in a refill for the Eiliquis.  Please let us know 1 week before you run out the next time. Labwork: None ordered  Testing/Procedures: None ordered  Follow-Up: Your physician recommends that you schedule a follow-up appointment in: 3-5 MONTHS WITH DR. [name]   Any Other Special Instructions Will Be Listed Below (If Applicable).     If you need a refill on your cardiac medications before your next appointment, please call your pharmacy.

## 2017-06-25 NOTE — Progress Notes (Signed)
06/25/2017 Sherri Pratt   1946-10-10  [id number]  Primary Physician [name], [name], NP Primary Cardiologist: Dr. [name]    Reason for Visit/CC: f/u for PAF.   HPI:  Sherri Pratt is a 71 y.o. female who is being seen today for f/u after new diagnosis of PAF. She has a h/o Insulin Dependent Diabetes Mellitus, Gout, HTN, HLD, CKD Stage 2, and anemia. [facility], vomiting and poor PO intake, and was found to have pre-renal AKI with increase in SCr up to the 3 range. This was 2/2 dehydration and she was treated with IVFs. SCr improved. She was also noted to have a UTI and was treated with antibiotics. During her hospitalization, she was noted to have new onset atrial fibrillation w/ RVR and spontaneously converted back to NSR. 2D echo showed normal LVEF. Her arrhythmia was felt secondary to her AKI, UTI and dehydration. However she was placed on Eliquis, 5 mg BID, for stroke prophylaxis. She was seen by Dr. [name] who recommended outpatient 30 day monitor to assess for recurrent atrial fibrillation to see if she would require long term OAC.   Monitor was completed and results reviewed by MD. Findings as follows: Abnormal 30 day event monitor with the presence of recurrent episodes of atrial fibrillation with rapid ventricular response.I n addition there are short episodes of regular wide complex tachycardia, most likely representing supraventricular arrhythmia with aberrancy. Background rhythm is sinus bradycardia, which precludes the use of conventional rate control medications.Oral anticoagulation should be continued.   Of note, she had a f/u CBC after intiation of Eliquis and Hgb was stable at 11.5.   She is back in clinic today for f/u. She is here today with her husband. She notes that she has done well. She denies any significant symptoms of palpations. No CP, dyspnea or dizziness. HR and BP are both well controlled. RRR on exam. She reports med compliance.  No abnormal bleeding or falls. She denies melena. She admits that she drinks ~4 cups of coffee a day.   Current Meds  Medication Sig  . Alcohol Swabs (B-D SINGLE USE SWABS REGULAR) PADS USE AS DIRECTED  . allopurinol (ZYLOPRIM) 300 MG tablet TAKE 1 TABLET BY MOUTH ONCE A DAY  . apixaban (ELIQUIS) 5 MG TABS tablet Take 1 tablet (5 mg total) by mouth 2 (two) times daily.  . cyanocobalamin (,VITAMIN B-12,) 1000 MCG/ML injection Inject 1,000 mcg into the muscle every 30 (thirty) days.  [name]. glipiZIDE (GLUCOTROL) 10 MG tablet TAKE 1 TABLET BY MOUTH TWICE A DAY BEFORE A MEAL  . glucose blood (BAYER CONTOUR TEST) test strip 1 each by Other route daily. Dx 250.01  . Lancets (FREESTYLE) lancets Use as instructed  . metoprolol tartrate (LOPRESSOR) 25 MG tablet Take 1 tablet (25 mg total) by mouth 2 (two) times daily.  . Multiple Vitamins-Minerals (PRESERVISION AREDS 2) CAPS Take 1 capsule by mouth 2 (two) times daily.  . simvastatin (ZOCOR) 20 MG tablet TAKE 1 TABLET BY MOUTH DAILY AT BEDTIME  . [DISCONTINUED] apixaban (ELIQUIS) 5 MG TABS tablet Take 1 tablet (5 mg total) by mouth 2 (two) times daily.   Allergies  Allergen Reactions  . Peanut-Containing Drug Products    Past Medical History:  Diagnosis Date  . Anemia   . Chronic kidney disease   . Diabetes mellitus   . Gout   . Hyperlipidemia   . Hypertension    Family History  Problem Relation Age of Onset  .  Cancer Mother        colon  . Hypertension Mother   . Cancer Father        colon   Past Surgical History:  Procedure Laterality Date  . APPENDECTOMY     Social History   Social History  . Marital status: Married    Spouse name: N/A  . Number of children: N/A  . Years of education: N/A   Occupational History  . Not on file.   Social History Main Topics  . Smoking status: Never Smoker  . Smokeless tobacco: Never Used  . Alcohol use No  . Drug use: No  . Sexual activity: Not on file   Other Topics Concern  . Not on  file   Social History Narrative   Retired - She Forensic psychologist   Married for 51 years    Three children - all in [place]      She likes to quilting and shopping.      Review of Systems: General: negative for chills, fever, night sweats or weight changes.  Cardiovascular: negative for chest pain, dyspnea on exertion, edema, orthopnea, palpitations, paroxysmal nocturnal dyspnea or shortness of breath Dermatological: negative for rash Respiratory: negative for cough or wheezing Urologic: negative for hematuria Abdominal: negative for nausea, vomiting, diarrhea, bright red blood per rectum, melena, or hematemesis Neurologic: negative for visual changes, syncope, or dizziness All other systems reviewed and are otherwise negative except as noted above.   Physical Exam:  Blood pressure 140/78, pulse 62, height 5' 5.5" (1.664 m), weight 197 lb 6.4 oz (89.5 kg), SpO2 96 %.  General appearance: alert, cooperative and no distress Neck: no carotid bruit and no JVD Lungs: clear to auscultation bilaterally Heart: regular rate and rhythm, S1, S2 normal, no murmur, click, rub or gallop Extremities: extremities normal, atraumatic, no cyanosis or edema Pulses: 2+ and symmetric Skin: Skin color, texture, turgor normal. No rashes or lesions Neurologic: Grossly normal  EKG not performed -- personally reviewed   ASSESSMENT AND PLAN:   1. PAF: 30 day monitor showed recurrence of PAF. CHA2DS2 VASc score is > 2. Thus it has been recommended by primary cardiologist that she be continued on long term oral anticoagulation with Eliquis, 5 mg BID, for stoke prophylaxis. She had a f/u CBC for monitoring of H/H after starting the medication and Hgb was stable. She denies abnormal bleeding and no falls. Tolerating well. We will continue along with metoprolol for rate control. Pt instructed to limit daily caffeine intake. F/u with Dr. Royann Shivers in 3-6 months.    Marjorie Lussier Delmer Islam, MHS [facility] 06/25/2017 12:39 PM

## 2017-07-20 ENCOUNTER — Other Ambulatory Visit: Payer: Self-pay | Admitting: Adult Health

## 2017-07-20 DIAGNOSIS — E139 Other specified diabetes mellitus without complications: Secondary | ICD-10-CM

## 2017-07-21 NOTE — Telephone Encounter (Signed)
Sent to the pharmacy by e-scribe.  Pt is scheduled for 12/01/17 for cpx.

## 2017-09-25 ENCOUNTER — Ambulatory Visit: Payer: Medicare Other | Admitting: Cardiovascular Disease

## 2017-09-25 ENCOUNTER — Encounter: Payer: Self-pay | Admitting: Cardiovascular Disease

## 2017-09-25 VITALS — BP 134/64 | HR 52 | Ht 65.0 in | Wt 199.0 lb

## 2017-09-25 DIAGNOSIS — I10 Essential (primary) hypertension: Secondary | ICD-10-CM

## 2017-09-25 DIAGNOSIS — E1169 Type 2 diabetes mellitus with other specified complication: Secondary | ICD-10-CM | POA: Diagnosis not present

## 2017-09-25 DIAGNOSIS — E782 Mixed hyperlipidemia: Secondary | ICD-10-CM

## 2017-09-25 DIAGNOSIS — I480 Paroxysmal atrial fibrillation: Secondary | ICD-10-CM | POA: Diagnosis not present

## 2017-09-25 DIAGNOSIS — E669 Obesity, unspecified: Secondary | ICD-10-CM

## 2017-09-25 MED ORDER — METOPROLOL TARTRATE 25 MG PO TABS: 12.5000 mg | ORAL_TABLET | Freq: Two times a day (BID) | ORAL | 3 refills | Status: DC

## 2017-09-25 NOTE — Patient Instructions (Signed)
Dr [name] has recommended making the following medication changes: 1. DECREASE Metoprolol to 12.5 mg twice daily  Your physician recommends that you schedule a follow-up appointment in 12 months. You will receive a reminder letter in the mail two months in advance. If you don't receive a letter, please call our office to schedule the follow-up appointment.  If you need a refill on your cardiac medications before your next appointment, please call your pharmacy.

## 2017-09-25 NOTE — Progress Notes (Signed)
Cardiology Office Note:    Date:  09/25/2017   ID:  Sherri Pratt, DOB [birth year], MRN [id number]  PCP:  Sherri FreesNafziger, Cory, NP  Cardiologist:  Sherri FairMihai Alka Falwell, MD    Referring MD: Sherri FreesNafziger, Cory, NP   Chief complaint: Atrial fibrillation follow-up  History of Present Illness:    Sherri Pratt is a 71 y.o. female with a hx of atrial fibrillation initially detected during hospitalization for acute renal insufficiency due to hypovolemia earlier this year the atrial fibrillation resolved spontaneously during her hospitalization,.  But asymptomatic atrial fibrillation was subsequently recorded on a 30-day event monitor.  She is taking anticoagulation with Eliquis and has not had any bleeding complications or focal neurological events.  She complains of fatigue but does not have other cardiovascular problems.  She is bradycardic today.  The patient specifically denies any chest pain at rest exertion, dyspnea at rest or with exertion, orthopnea, paroxysmal nocturnal dyspnea, syncope, palpitations, focal neurological deficits, intermittent claudication, lower extremity edema, unexplained weight gain, cough, hemoptysis or wheezing.  She denies falls or bleeding.  As on previous ECGs in sinus rhythm she has a mildly prolonged QT interval.  Renal parameters have improved.  Baseline creatinine seems to be around 1.2-1.4 (creatinine clearance approximately 40-50).  However, in January her creatinine was 1.0.  She has diabetes mellitus, hyperlipidemia and hypertension, all of them well controlled on current medical regimen.   Past Medical History:  Diagnosis Date  . Anemia   . Chronic kidney disease   . Diabetes mellitus   . Gout   . Hyperlipidemia   . Hypertension     Past Surgical History:  Procedure Laterality Date  . APPENDECTOMY      Current Medications: Current Meds  Medication Sig  . Alcohol Swabs (B-D SINGLE USE SWABS REGULAR) PADS USE AS DIRECTED  . allopurinol (ZYLOPRIM) 300 MG  tablet TAKE 1 TABLET BY MOUTH ONCE A DAY  . apixaban (ELIQUIS) 5 MG TABS tablet Take 1 tablet (5 mg total) by mouth 2 (two) times daily.  . cyanocobalamin (,VITAMIN B-12,) 1000 MCG/ML injection Inject 1,000 mcg into the muscle every 30 (thirty) days.  [name]. glipiZIDE (GLUCOTROL) 10 MG tablet TAKE 1 TABLET BY MOUTH TWICE A DAY BEFORE A MEAL  . glucose blood (BAYER CONTOUR TEST) test strip 1 each by Other route daily. Dx 250.01  . Lancets (FREESTYLE) lancets Use as instructed  . metoprolol tartrate (LOPRESSOR) 25 MG tablet Take 0.5 tablets (12.5 mg total) by mouth 2 (two) times daily.  . Multiple Vitamins-Minerals (PRESERVISION AREDS 2) CAPS Take 1 capsule by mouth 2 (two) times daily.  . simvastatin (ZOCOR) 20 MG tablet TAKE 1 TABLET BY MOUTH DAILY AT BEDTIME  . [DISCONTINUED] metoprolol tartrate (LOPRESSOR) 25 MG tablet Take 1 tablet (25 mg total) by mouth 2 (two) times daily.     Allergies:   Peanut-containing drug products   Social History   Socioeconomic History  . Marital status: Married    Spouse name: None  . Number of children: None  . Years of education: None  . Highest education level: None  Social Needs  . Financial resource strain: None  . Food insecurity - worry: None  . Food insecurity - inability: None  . Transportation needs - medical: None  . Transportation needs - non-medical: None  Occupational History  . None  Tobacco Use  . Smoking status: Never Smoker  . Smokeless tobacco: Never Used  Substance and Sexual Activity  . Alcohol use: No  .  Drug use: No  . Sexual activity: None  Other Topics Concern  . None  Social History Narrative   Retired - She Forensic psychologistsold insurance   Married for 51 years    Three children - all in [place]      She likes to quilting and shopping.     Family History: The patient's family history includes Cancer in her father and mother; Hypertension in her mother. ROS:   Please see the history of present illness.    All other systems reviewed and  are negative.  EKGs/Labs/Other Studies Reviewed:    EKG:  EKG is ordered today.  The ekg ordered today demonstrates sinus bradycardia, nonspecific ST-T abnormality, borderline QTC 470 ms  Recent Labs: 05/06/2017: ALT 13; BUN 14; Creatinine, Ser 1.44; Magnesium 1.9; Potassium 3.9; Sodium 140; TSH 0.95 05/11/2017: Hemoglobin 11.5; Platelets 244  Recent Lipid Panel    Component Value Date/Time   CHOL 136 11/10/2016 0820   TRIG 181.0 (H) 11/10/2016 0820   TRIG 184 (H) 09/04/2006 0838   HDL 41.60 11/10/2016 0820   CHOLHDL 3 11/10/2016 0820   VLDL 36.2 11/10/2016 0820   LDLCALC 58 11/10/2016 0820    Physical Exam:    VS:  BP 134/64   Pulse (!) 52   Ht 5\' 5"  (1.651 m)   Wt 199 lb (90.3 kg)   BMI 33.12 kg/m     Wt Readings from Last 3 Encounters:  09/25/17 199 lb (90.3 kg)  06/25/17 197 lb 6.4 oz (89.5 kg)  05/26/17 194 lb (88 kg)     GEN: Mildly obese well nourished, well developed in no acute distress HEENT: Normal NECK: No JVD; No carotid bruits LYMPHATICS: No lymphadenopathy CARDIAC: RRR, no murmurs, rubs, gallops RESPIRATORY:  Clear to auscultation without rales, wheezing or rhonchi  ABDOMEN: Soft, non-tender, non-distended MUSCULOSKELETAL:  No edema; No deformity  SKIN: Warm and dry NEUROLOGIC:  Alert and oriented x 3 PSYCHIATRIC:  Normal affect   ASSESSMENT:    1. Paroxysmal atrial fibrillation (HCC)   2. Essential hypertension   3. Diabetes mellitus type 2 in obese (HCC)   4. Mixed hyperlipidemia   5. Mild obesity    PLAN:    In order of problems listed above:  1. AFib: Her episodes of atrial fibrillation have always been asymptomatic, so is hard to say with the burden of arrhythmia is, but she did have A. fib during a 30-day event monitor.  Current dose of metoprolol is associated with bradycardia and fatigue slowly decrease the dose to 12.5 mg twice daily. 2. HTN: Well-controlled 3. DM: Controlled on oral antidiabetic, recent hemoglobin A1c 6.2% 4. HLP:  on statin with excellent LDL cholesterol on lipid profile earlier this year mild residual hypertriglyceridemia, consistent with insulin resistance. 5. Obesity: Weight loss encouraged for better metabolic control as well as reduction in A. Fib prevalence.   Medication Adjustments/Labs and Tests Ordered: Current medicines are reviewed at length with the patient today.  Concerns regarding medicines are outlined above.  Orders Placed This Encounter  Procedures  . EKG 12-Lead   Meds ordered this encounter  Medications  . metoprolol tartrate (LOPRESSOR) 25 MG tablet    Sig: Take 0.5 tablets (12.5 mg total) by mouth 2 (two) times daily.    Dispense:  90 tablet    Refill:  3    Signed, Sherri FairMihai Tomeshia Pizzi, MD  09/25/2017 10:02 AM    [facility]

## 2017-12-01 ENCOUNTER — Ambulatory Visit (INDEPENDENT_AMBULATORY_CARE_PROVIDER_SITE_OTHER): Payer: Medicare Other | Admitting: Adult Health

## 2017-12-01 ENCOUNTER — Encounter: Payer: Self-pay | Admitting: Adult Health

## 2017-12-01 VITALS — BP 140/82 | Temp 97.7°F | Ht 65.0 in | Wt 208.0 lb

## 2017-12-01 DIAGNOSIS — Z23 Encounter for immunization: Secondary | ICD-10-CM

## 2017-12-01 DIAGNOSIS — E139 Other specified diabetes mellitus without complications: Secondary | ICD-10-CM

## 2017-12-01 DIAGNOSIS — I10 Essential (primary) hypertension: Secondary | ICD-10-CM

## 2017-12-01 DIAGNOSIS — E0590 Thyrotoxicosis, unspecified without thyrotoxic crisis or storm: Secondary | ICD-10-CM | POA: Diagnosis not present

## 2017-12-01 DIAGNOSIS — Z0000 Encounter for general adult medical examination without abnormal findings: Secondary | ICD-10-CM | POA: Diagnosis not present

## 2017-12-01 DIAGNOSIS — E785 Hyperlipidemia, unspecified: Secondary | ICD-10-CM

## 2017-12-01 DIAGNOSIS — E109 Type 1 diabetes mellitus without complications: Secondary | ICD-10-CM | POA: Diagnosis not present

## 2017-12-01 LAB — HEPATIC FUNCTION PANEL
ALBUMIN: 4 g/dL (ref 3.5–5.2)
ALK PHOS: 84 U/L (ref 39–117)
ALT: 9 U/L (ref 0–35)
AST: 10 U/L (ref 0–37)
Bilirubin, Direct: 0.1 mg/dL (ref 0.0–0.3)
TOTAL PROTEIN: 6.6 g/dL (ref 6.0–8.3)
Total Bilirubin: 0.7 mg/dL (ref 0.2–1.2)

## 2017-12-01 LAB — T4, FREE: FREE T4: 0.97 ng/dL (ref 0.60–1.60)

## 2017-12-01 LAB — CBC WITH DIFFERENTIAL/PLATELET
BASOS ABS: 0.1 10*3/uL (ref 0.0–0.1)
BASOS PCT: 0.7 % (ref 0.0–3.0)
EOS PCT: 2.6 % (ref 0.0–5.0)
Eosinophils Absolute: 0.2 10*3/uL (ref 0.0–0.7)
HEMATOCRIT: 37.5 % (ref 36.0–46.0)
Hemoglobin: 12.5 g/dL (ref 12.0–15.0)
LYMPHS PCT: 21.1 % (ref 12.0–46.0)
Lymphs Abs: 1.6 10*3/uL (ref 0.7–4.0)
MCHC: 33.3 g/dL (ref 30.0–36.0)
MCV: 82.7 fl (ref 78.0–100.0)
MONOS PCT: 6 % (ref 3.0–12.0)
Monocytes Absolute: 0.5 10*3/uL (ref 0.1–1.0)
NEUTROS ABS: 5.3 10*3/uL (ref 1.4–7.7)
Neutrophils Relative %: 69.6 % (ref 43.0–77.0)
PLATELETS: 203 10*3/uL (ref 150.0–400.0)
RBC: 4.53 Mil/uL (ref 3.87–5.11)
RDW: 16 % — AB (ref 11.5–15.5)
WBC: 7.6 10*3/uL (ref 4.0–10.5)

## 2017-12-01 LAB — BASIC METABOLIC PANEL
BUN: 23 mg/dL (ref 6–23)
CHLORIDE: 105 meq/L (ref 96–112)
CO2: 25 meq/L (ref 19–32)
Calcium: 9.6 mg/dL (ref 8.4–10.5)
Creatinine, Ser: 1.25 mg/dL — ABNORMAL HIGH (ref 0.40–1.20)
GFR: 44.65 mL/min — ABNORMAL LOW (ref >60.00)
GLUCOSE: 164 mg/dL — AB (ref 70–99)
POTASSIUM: 4.3 meq/L (ref 3.5–5.1)
SODIUM: 138 meq/L (ref 135–145)

## 2017-12-01 LAB — LIPID PANEL
CHOLESTEROL: 150 mg/dL (ref 0–200)
HDL: 44.7 mg/dL (ref >39.00)
LDL Cholesterol: 75 mg/dL (ref 0–99)
NONHDL: 105.74
Total CHOL/HDL Ratio: 3
Triglycerides: 153 mg/dL — ABNORMAL HIGH (ref 0.0–149.0)
VLDL: 30.6 mg/dL (ref 0.0–40.0)

## 2017-12-01 LAB — TSH: TSH: 1.33 u[IU]/mL (ref 0.35–4.50)

## 2017-12-01 LAB — T3, FREE: T3 FREE: 3.3 pg/mL (ref 2.3–4.2)

## 2017-12-01 LAB — HEMOGLOBIN A1C: Hgb A1c MFr Bld: 7.3 % — ABNORMAL HIGH (ref 4.6–6.5)

## 2017-12-01 NOTE — Progress Notes (Signed)
Subjective:    Patient ID: Sherri Pratt, female    DOB: [birth year], 72 y.o.   MRN: [id number]  HPI  Patient presents for yearly preventative medicine examination. She is a pleasant 72 year old female who  has a past medical history of Anemia, Chronic kidney disease, Diabetes mellitus, Gout, Hyperlipidemia, and Hypertension.  She takes glipizide due to history of diabetes Lab Results  Component Value Date   HGBA1C 6.2 05/26/2017    She takes allopurinol 300 mg for gout   She takes simvastatin 20 mg for history of hyperlipidemia Lab Results  Component Value Date   CHOL 136 11/10/2016   HDL 41.60 11/10/2016   LDLCALC 58 11/10/2016   TRIG 181.0 (H) 11/10/2016   CHOLHDL 3 11/10/2016   She is on Eliquis and metoprolol due to history of A. Fib-followed by cardiology was last seen in December 2018  She does have a history of hyperthyroidism, last TSH, free T4 and free T3  that were normal.  She has not been on any medication.  All immunizations and health maintenance protocols were reviewed with the patient and needed orders were placed. She is due for flu shot.   Appropriate screening laboratory values were ordered for the patient including screening of hyperlipidemia, renal function and hepatic function.  Medication reconciliation,  past medical history, social history, problem list and allergies were reviewed in detail with the patient  Goals were established with regard to weight loss, exercise, and  diet in compliance with medications.   End of life planning was discussed.  She does not have an advanced directive or living well  She is up-to-date on routine dental and vision exams.  Her last mammogram was in 2017. She is unsure of when her last colonoscopy was. She will contact. Dr. Marian Sorrow office to find out. She is due for dexa scan but does not want one. She does routine breast exams and has not noticed any changes in her breasts. Does not want mammogram   She has no acute  issues   Review of Systems  Constitutional: Negative.   HENT: Negative.   Eyes: Negative.   Respiratory: Negative.   Cardiovascular: Negative.   Gastrointestinal: Negative.   Endocrine: Negative.   Genitourinary: Negative.   Musculoskeletal: Positive for arthralgias and back pain.  Skin: Negative.   Allergic/Immunologic: Negative.   Neurological: Negative.   Hematological: Negative.   Psychiatric/Behavioral: Negative.    Past Medical History:  Diagnosis Date  . Anemia   . Chronic kidney disease   . Diabetes mellitus   . Gout   . Hyperlipidemia   . Hypertension     Social History   Socioeconomic History  . Marital status: Married    Spouse name: Not on file  . Number of children: Not on file  . Years of education: Not on file  . Highest education level: Not on file  Social Needs  . Financial resource strain: Not on file  . Food insecurity - worry: Not on file  . Food insecurity - inability: Not on file  . Transportation needs - medical: Not on file  . Transportation needs - non-medical: Not on file  Occupational History  . Not on file  Tobacco Use  . Smoking status: Never Smoker  . Smokeless tobacco: Never Used  Substance and Sexual Activity  . Alcohol use: No  . Drug use: No  . Sexual activity: Not on file  Other Topics Concern  . Not on file  Social  History Narrative   Retired - She Forensic psychologist   Married for 51 years    Three children - all in [place]      She likes to quilting and shopping.     Past Surgical History:  Procedure Laterality Date  . APPENDECTOMY      Family History  Problem Relation Age of Onset  . Cancer Mother        colon  . Hypertension Mother   . Cancer Father        colon    Allergies  Allergen Reactions  . Peanut-Containing Drug Products     Current Outpatient Medications on File Prior to Visit  Medication Sig Dispense Refill  . Alcohol Swabs (B-D SINGLE USE SWABS REGULAR) PADS USE AS DIRECTED 100 each 1  .  allopurinol (ZYLOPRIM) 300 MG tablet TAKE 1 TABLET BY MOUTH ONCE A DAY 90 tablet 3  . apixaban (ELIQUIS) 5 MG TABS tablet Take 1 tablet (5 mg total) by mouth 2 (two) times daily. 180 tablet 1  . cyanocobalamin (,VITAMIN B-12,) 1000 MCG/ML injection Inject 1,000 mcg into the muscle every 30 (thirty) days.    [name] glipiZIDE (GLUCOTROL) 10 MG tablet TAKE 1 TABLET BY MOUTH TWICE A DAY BEFORE A MEAL 180 tablet 1  . glucose blood (BAYER CONTOUR TEST) test strip 1 each by Other route daily. Dx 250.01 100 each 12  . Lancets (FREESTYLE) lancets Use as instructed 100 each 3  . metoprolol tartrate (LOPRESSOR) 25 MG tablet Take 0.5 tablets (12.5 mg total) by mouth 2 (two) times daily. 90 tablet 3  . simvastatin (ZOCOR) 20 MG tablet TAKE 1 TABLET BY MOUTH DAILY AT BEDTIME 90 tablet 3  . Multiple Vitamins-Minerals (PRESERVISION AREDS 2) CAPS Take 1 capsule by mouth 2 (two) times daily.     No current facility-administered medications on file prior to visit.     BP (!) 160/84 (BP Location: Right Arm)   Temp 97.7 F (36.5 C) (Oral)   Ht 5\' 5"  (1.651 m)   Wt 208 lb (94.3 kg)   BMI 34.61 kg/m        Objective:   Physical Exam  Constitutional: She is oriented to person, place, and time. She appears well-developed and well-nourished. No distress.  Overweight    HENT:  Head: Normocephalic and atraumatic.  Right Ear: External ear normal.  Left Ear: External ear normal.  Nose: Nose normal.  Mouth/Throat: Oropharynx is clear and moist. No oropharyngeal exudate.  Eyes: Conjunctivae and EOM are normal. Pupils are equal, round, and reactive to light. Right eye exhibits no discharge. Left eye exhibits no discharge. No scleral icterus.  Neck: Normal range of motion. Neck supple. No JVD present. Carotid bruit is not present. No tracheal deviation present. No thyromegaly present.  Cardiovascular: Normal rate, regular rhythm, normal heart sounds and intact distal pulses. Exam reveals no gallop and no friction rub.    No murmur heard. Pulmonary/Chest: Effort normal and breath sounds normal. No stridor. No respiratory distress. She has no wheezes. She has no rales. She exhibits no tenderness.  Abdominal: Soft. Bowel sounds are normal. She exhibits no distension and no mass. There is no tenderness. There is no rebound and no guarding.  Genitourinary:  Genitourinary Comments: Refused breast exam   Musculoskeletal: Normal range of motion. She exhibits no edema, tenderness or deformity.  Lymphadenopathy:    She has no cervical adenopathy.  Neurological: She is alert and oriented to person, place, and time. She has normal reflexes.  She displays normal reflexes. No cranial nerve deficit. She exhibits normal muscle tone. Coordination normal.  Skin: Skin is warm and dry. No rash noted. No erythema. No pallor.  Psychiatric: She has a normal mood and affect. Her behavior is normal. Judgment and thought content normal.  Nursing note and vitals reviewed.     Assessment & Plan:  1. Routine general medical examination at a health care facility - Encouraged weight loss through diet and exercise  - Follow up in one year or sooner if needed  - Basic metabolic panel - CBC with Differential/Platelet - Hepatic function panel - Lipid panel - TSH - Hemoglobin A1c  2. Hyperlipidemia, unspecified hyperlipidemia type - Consider increasing statin  - Basic metabolic panel - CBC with Differential/Platelet - Hepatic function panel - Lipid panel - TSH - Hemoglobin A1c  3. Diabetes 1.5, managed as type 1 (HCC) - Follow up in 6 months or sooner if needed.  -Consider adding agent  - Encouraged diabetic diet and exercise  - Basic metabolic panel - CBC with Differential/Platelet - Hepatic function panel - Lipid panel - TSH - Hemoglobin A1c  4. Hyperthyroidism - Basic metabolic panel - CBC with Differential/Platelet - Hepatic function panel - Lipid panel - TSH - Hemoglobin A1c - T3, Free - T4, Free  5.  Essential hypertension - Near goal. No change in medication - Basic metabolic panel - CBC with Differential/Platelet - Hepatic function panel - Lipid panel - TSH - Hemoglobin A1c  6. Need for influenza vaccination  - Flu vaccine HIGH DOSE PF (Fluzone High dose)  Shirline Freesory [place], NP

## 2017-12-01 NOTE — Patient Instructions (Signed)
It was great seeing you today   Please contact Dr. Dickie LaBrody to find out when your last colonscopy was  I will follow up with you regarding your blood work  Please work on diet and exercise  Follow up in one year for next physical and six months for next diabetes check

## 2018-02-02 ENCOUNTER — Other Ambulatory Visit: Payer: Self-pay | Admitting: Adult Health

## 2018-02-02 DIAGNOSIS — E139 Other specified diabetes mellitus without complications: Secondary | ICD-10-CM

## 2018-02-02 DIAGNOSIS — M10079 Idiopathic gout, unspecified ankle and foot: Secondary | ICD-10-CM

## 2018-02-02 NOTE — Telephone Encounter (Signed)
Sent to the pharmacy by e-scribe. 

## 2018-02-15 ENCOUNTER — Other Ambulatory Visit: Payer: Self-pay | Admitting: Cardiovascular Disease

## 2018-03-04 ENCOUNTER — Ambulatory Visit: Payer: Medicare Other | Admitting: Adult Health

## 2018-03-04 ENCOUNTER — Encounter: Payer: Self-pay | Admitting: Adult Health

## 2018-03-04 VITALS — BP 150/90 | Temp 98.0°F | Wt 211.0 lb

## 2018-03-04 DIAGNOSIS — E139 Other specified diabetes mellitus without complications: Secondary | ICD-10-CM

## 2018-03-04 LAB — POCT GLYCOSYLATED HEMOGLOBIN (HGB A1C): HEMOGLOBIN A1C: 7.1 % — AB (ref 4.0–5.6)

## 2018-03-04 MED ORDER — FREESTYLE LANCETS MISC: 3 refills | Status: DC

## 2018-03-04 NOTE — Patient Instructions (Signed)
It was great seeing you today   Your A1c has improved   Please work on diabetic diet and follow up in 3 months for recheck

## 2018-03-04 NOTE — Progress Notes (Signed)
Subjective:    Patient ID: Sherri Pratt, female    DOB: Dec 08, 1945, 72 y.o.   MRN: [id number]  HPI   72 year old female who  has a past medical history of Anemia, Chronic kidney disease, Diabetes mellitus, Gout, Hyperlipidemia, and Hypertension. She presents to the office today for 3 month follow up regarding diabetes.  She is currently prescribed Glipizide 10 mg. She does not check her blood sugars on a consistent basis. She denies any symptoms of hypoglycemia. She has not been following a diabetic diet nor exercising on a regular basis ( due to arthritis). Her last A1c was 7.3 in February 2019  Wt Readings from Last 3 Encounters:  03/04/18 211 lb (95.7 kg)  12/01/17 208 lb (94.3 kg)  09/25/17 199 lb (90.3 kg)    Review of Systems See HPI   Past Medical History:  Diagnosis Date  . Anemia   . Chronic kidney disease   . Diabetes mellitus   . Gout   . Hyperlipidemia   . Hypertension     Social History   Socioeconomic History  . Marital status: Married    Spouse name: Not on file  . Number of children: Not on file  . Years of education: Not on file  . Highest education level: Not on file  Occupational History  . Not on file  Social Needs  . Financial resource strain: Not on file  . Food insecurity:    Worry: Not on file    Inability: Not on file  . Transportation needs:    Medical: Not on file    Non-medical: Not on file  Tobacco Use  . Smoking status: Never Smoker  . Smokeless tobacco: Never Used  Substance and Sexual Activity  . Alcohol use: No  . Drug use: No  . Sexual activity: Not on file  Lifestyle  . Physical activity:    Days per week: Not on file    Minutes per session: Not on file  . Stress: Not on file  Relationships  . Social connections:    Talks on phone: Not on file    Gets together: Not on file    Attends religious service: Not on file    Active member of club or organization: Not on file    Attends meetings of clubs or organizations: Not  on file    Relationship status: Not on file  . Intimate partner violence:    Fear of current or ex partner: Not on file    Emotionally abused: Not on file    Physically abused: Not on file    Forced sexual activity: Not on file  Other Topics Concern  . 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     Past Surgical History:  Procedure Laterality Date  . APPENDECTOMY      Family History  Problem Relation Age of Onset  . Cancer Mother        colon  . Hypertension Mother   . Cancer Father        colon    Allergies  Allergen Reactions  . Peanut-Containing Drug Products     Current Outpatient Medications on File Prior to Visit  Medication Sig Dispense Refill  . Alcohol Swabs (B-D SINGLE USE SWABS REGULAR) PADS USE AS DIRECTED 100  each 1  . allopurinol (ZYLOPRIM) 300 MG tablet TAKE 1 TABLET BY MOUTH ONCE DAILY 90 tablet 2  . cyanocobalamin (,VITAMIN B-12,) 1000 MCG/ML injection Inject 1,000 mcg into the muscle every 30 (thirty) days.    [name] ELIQUIS 5 MG TABS tablet TAKE 1 TABLET BY MOUTH TWICE (2) DAILY 180 tablet 1  . glipiZIDE (GLUCOTROL) 10 MG tablet TAKE 1 TABLET BY MOUTH TWICE A DAY BEFORE A MEAL 180 tablet 1  . glucose blood (BAYER CONTOUR TEST) test strip 1 each by Other route daily. Dx 250.01 100 each 12  . Lancets (FREESTYLE) lancets Use as instructed 100 each 3  . metoprolol tartrate (LOPRESSOR) 25 MG tablet Take 0.5 tablets (12.5 mg total) by mouth 2 (two) times daily. 90 tablet 3  . Multiple Vitamins-Minerals (PRESERVISION AREDS 2) CAPS Take 1 capsule by mouth 2 (two) times daily.    . simvastatin (ZOCOR) 20 MG tablet TAKE 1 TABLET BY MOUTH DAILY AT BEDTIME 90 tablet 3   No current facility-administered medications on file prior to visit.     BP (!) 150/90   Temp 98 F (36.7 C) (Oral)   Wt 211 lb (95.7 kg)   BMI 35.11 kg/m         Objective:   Physical Exam  Constitutional: She is oriented to person, place, and time. She appears well-developed and well-nourished. No distress.  Cardiovascular: Normal rate, regular rhythm, normal heart sounds and intact distal pulses.  Pulmonary/Chest: Effort normal and breath sounds normal. No stridor. No respiratory distress. She has no wheezes. She has no rales. She exhibits no tenderness.  Abdominal: Soft. Bowel sounds are normal.  Musculoskeletal:  Slow steady gait  Neurological: She is alert and oriented to person, place, and time.  Skin: She is not diaphoretic.  Psychiatric: She has a normal mood and affect. Her behavior is normal. Judgment and thought content normal.  Nursing note and vitals reviewed.     Assessment & Plan:  1. Diabetes 1.5, managed as type 1 (HCC) - POCT A1C - 7.1 - has improved - No change in medication at this time  - Advised diabetic diet andto monitor BS on regular basis.  - Follow up in 3 months or sooner if needed - Lancets (FREESTYLE) lancets; Use as instructed  Dispense: 100 each; Refill: 3   [name], NP

## 2018-03-11 DIAGNOSIS — H524 Presbyopia: Secondary | ICD-10-CM | POA: Diagnosis not present

## 2018-03-11 LAB — HM DIABETES EYE EXAM

## 2018-03-17 ENCOUNTER — Encounter: Payer: Self-pay | Admitting: Family Medicine

## 2018-04-20 ENCOUNTER — Other Ambulatory Visit: Payer: Self-pay | Admitting: Adult Health

## 2018-04-20 DIAGNOSIS — E139 Other specified diabetes mellitus without complications: Secondary | ICD-10-CM

## 2018-04-21 NOTE — Telephone Encounter (Signed)
FILLED ON 02/02/2018 FOR 6 MONTHS.  REQUEST IS TOO EARLY.

## 2018-06-03 ENCOUNTER — Telehealth: Payer: Self-pay

## 2018-06-03 ENCOUNTER — Encounter: Payer: Self-pay | Admitting: Adult Health

## 2018-06-03 ENCOUNTER — Ambulatory Visit (INDEPENDENT_AMBULATORY_CARE_PROVIDER_SITE_OTHER): Payer: Medicare Other | Admitting: Adult Health

## 2018-06-03 ENCOUNTER — Other Ambulatory Visit: Payer: Self-pay | Admitting: Adult Health

## 2018-06-03 VITALS — BP 134/80 | HR 57 | Temp 97.9°F | Wt 213.2 lb

## 2018-06-03 DIAGNOSIS — E139 Other specified diabetes mellitus without complications: Secondary | ICD-10-CM

## 2018-06-03 DIAGNOSIS — N3946 Mixed incontinence: Secondary | ICD-10-CM

## 2018-06-03 LAB — POCT GLYCOSYLATED HEMOGLOBIN (HGB A1C): HbA1c POC (<> result, manual entry): 7.5 % (ref 4.0–5.6)

## 2018-06-03 MED ORDER — METFORMIN HCL 500 MG PO TABS: 250.0000 mg | ORAL_TABLET | Freq: Two times a day (BID) | ORAL | 1 refills | Status: DC

## 2018-06-03 MED ORDER — MIRABEGRON ER 25 MG PO TB24: 25.0000 mg | ORAL_TABLET | Freq: Every day | ORAL | 0 refills | Status: DC

## 2018-06-03 MED ORDER — SOLIFENACIN SUCCINATE 5 MG PO TABS: 5.0000 mg | ORAL_TABLET | Freq: Every day | ORAL | 0 refills | Status: DC

## 2018-06-03 NOTE — Progress Notes (Signed)
Subjective:    Patient ID: Sherri Pratt, female    DOB: September 20, 1946, 72 y.o.   MRN: [id number]  HPI  72 year old female who  has a past medical history of Anemia, Chronic kidney disease, Diabetes mellitus, Gout, Hyperlipidemia, and Hypertension. She presents to the office today for 72-month follow-up regarding diabetes mellitus.  Her current regimen includes that of Glipizide 10 mg twice daily.  Her last A1c had improved from 7.3-7.1.  She does not exercise due to arthritic pain and she does not follow a diabetic diet  She is not checking blood sugars on a regular basis.   Lab Results  Component Value Date   HGBA1C 7.1 (A) 03/04/2018   Wt Readings from Last 3 Encounters:  06/03/18 213 lb 4 oz (96.7 kg)  03/04/18 211 lb (95.7 kg)  12/01/17 208 lb (94.3 kg)   Her other complaint today is that of chronic urinary leakage. This is has been present for 6 months or so. She denies any UTI like symptoms. She reports that when she changes positions she will have urinary leakage. Denies feeling of incomplete bladder emptying or feeling like she cannot make it to the bathroom in time. Per patient " when I stand up, it leaks out."   Review of Systems See HPI   Past Medical History:  Diagnosis Date  . Anemia   . Chronic kidney disease   . Diabetes mellitus   . Gout   . Hyperlipidemia   . Hypertension     Social History   Socioeconomic History  . Marital status: Married    Spouse name: Not on file  . Number of children: Not on file  . Years of education: Not on file  . Highest education level: Not on file  Occupational History  . Not on file  Social Needs  . Financial resource strain: Not on file  . Food insecurity:    Worry: Not on file    Inability: Not on file  . Transportation needs:    Medical: Not on file    Non-medical: Not on file  Tobacco Use  . Smoking status: Never Smoker  . Smokeless tobacco: Never Used  Substance and Sexual Activity  . Alcohol use: No  .  Drug use: No  . Sexual activity: Not on file  Lifestyle  . Physical activity:    Days per week: Not on file    Minutes per session: Not on file  . Stress: Not on file  Relationships  . Social connections:    Talks on phone: Not on file    Gets together: Not on file    Attends religious service: Not on file    Active member of club or organization: Not on file    Attends meetings of clubs or organizations: Not on file    Relationship status: Not on file  . Intimate partner violence:    Fear of current or ex partner: Not on file    Emotionally abused: Not on file    Physically abused: Not on file    Forced sexual activity: Not on file  Other Topics Concern  . Not on file  Social History Narrative   Retired - She Forensic psychologist   Married for 51 years    Three children - all in [place]      She likes to quilting and shopping.     Past Surgical History:  Procedure Laterality Date  . APPENDECTOMY      Family  History  Problem Relation Age of Onset  . Cancer Mother        colon  . Hypertension Mother   . Cancer Father        colon    Allergies  Allergen Reactions  . Peanut-Containing Drug Products     Current Outpatient Medications on File Prior to Visit  Medication Sig Dispense Refill  . Alcohol Swabs (B-D SINGLE USE SWABS REGULAR) PADS USE AS DIRECTED 100 each 1  . allopurinol (ZYLOPRIM) 300 MG tablet TAKE 1 TABLET BY MOUTH ONCE DAILY 90 tablet 2  . cyanocobalamin (,VITAMIN B-12,) 1000 MCG/ML injection Inject 1,000 mcg into the muscle every 30 (thirty) days.    [name]. ELIQUIS 5 MG TABS tablet TAKE 1 TABLET BY MOUTH TWICE (2) DAILY 180 tablet 1  . glipiZIDE (GLUCOTROL) 10 MG tablet TAKE 1 TABLET BY MOUTH TWICE A DAY BEFORE A MEAL 180 tablet 1  . glucose blood (BAYER CONTOUR TEST) test strip 1 each by Other route daily. Dx 250.01 100 each 12  . Lancets (FREESTYLE) lancets Use as instructed 100 each 3  . metoprolol tartrate (LOPRESSOR) 25 MG tablet Take 0.5 tablets (12.5 mg  total) by mouth 2 (two) times daily. 90 tablet 3  . Multiple Vitamins-Minerals (PRESERVISION AREDS 2) CAPS Take 1 capsule by mouth 2 (two) times daily.    . simvastatin (ZOCOR) 20 MG tablet TAKE 1 TABLET BY MOUTH DAILY AT BEDTIME 90 tablet 3   No current facility-administered medications on file prior to visit.     BP 134/80 (BP Location: Left Arm, Patient Position: Sitting, Cuff Size: Normal)   Pulse (!) 57   Temp 97.9 F (36.6 C) (Oral)   Wt 213 lb 4 oz (96.7 kg)   SpO2 99%   BMI 35.49 kg/m       Objective:   Physical Exam  Constitutional: She is oriented to person, place, and time. She appears well-developed and well-nourished. No distress.  Cardiovascular: Normal rate, regular rhythm, normal heart sounds and intact distal pulses.  Pulmonary/Chest: Effort normal and breath sounds normal.  Abdominal: Soft. Bowel sounds are normal. She exhibits no distension. There is no tenderness.  Neurological: She is alert and oriented to person, place, and time.  Skin: Skin is warm and dry. Capillary refill takes less than 2 seconds. She is not diaphoretic.  Psychiatric: She has a normal mood and affect. Her behavior is normal. Judgment and thought content normal.  Nursing note and vitals reviewed.     Assessment & Plan:  1. Diabetes 1.5, managed as type 1 (HCC) - POC HgB A1c - 7.5  - Will add metformin 250 mg BID.  - Encouraged frequent BS checks and to follow a diabetic diet.  - metFORMIN (GLUCOPHAGE) 500 MG tablet; Take 0.5 tablets (250 mg total) by mouth 2 (two) times daily with a meal.  Dispense: 45 tablet; Refill: 1 - Follow up in 3 months   2. Mixed stress and urge urinary incontinence - Will trial her on myrbetriq 25 mg  - Will reasses at next visit  - mirabegron ER (MYRBETRIQ) 25 MG TB24 tablet; Take 1 tablet (25 mg total) by mouth daily.  Dispense: 90 tablet; Refill: 0   Shirline Freesory Iasiah Ozment, NP

## 2018-06-03 NOTE — Telephone Encounter (Signed)
Cory please advise on change of myrbetriq

## 2018-06-03 NOTE — Telephone Encounter (Signed)
Vesicare 5 mg has been sent to the pharmacy. We talked about the side effects of this medication ( drowsiness,dry mouth, etc)

## 2018-06-03 NOTE — Telephone Encounter (Signed)
Pharmacy called and stated that the Myrbetriq is over $100 and the patient can not afford it. The pharmacy is requesting an alternative. They have suggested Detrol and Vesicare.  Please advise.

## 2018-06-16 ENCOUNTER — Telehealth: Payer: Self-pay | Admitting: *Deleted

## 2018-06-16 NOTE — Telephone Encounter (Signed)
Sherri Pratt called in with [employer]. PA for medication was denied due to Non formulary..    Some other options are:   Oxybutynin  Tera dine SR  Toviaz    For any further questions call: [phone] opt 5

## 2018-06-16 NOTE — Telephone Encounter (Signed)
Prior auth for Solifenacin succinate 5mg  sent to [email or web address] [id number].

## 2018-06-17 ENCOUNTER — Other Ambulatory Visit: Payer: Self-pay | Admitting: Adult Health

## 2018-06-17 MED ORDER — OXYBUTYNIN CHLORIDE ER 5 MG PO TB24: 5.0000 mg | ORAL_TABLET | Freq: Every day | ORAL | 1 refills | Status: DC

## 2018-06-17 NOTE — Telephone Encounter (Signed)
Oxybutynin sent to pharmacy.

## 2018-06-17 NOTE — Telephone Encounter (Signed)
Pt notified to pick up new rx.

## 2018-06-18 ENCOUNTER — Other Ambulatory Visit: Payer: Self-pay | Admitting: Adult Health

## 2018-06-18 DIAGNOSIS — E7801 Familial hypercholesterolemia: Secondary | ICD-10-CM

## 2018-06-18 NOTE — Telephone Encounter (Signed)
Sent to the pharmacy by e-scribe. 

## 2018-06-21 ENCOUNTER — Telehealth: Payer: Self-pay | Admitting: *Deleted

## 2018-06-21 NOTE — Telephone Encounter (Signed)
Prior auth for Oxybutynin Chloride ER 5mg  tablets sent to [email or web address]-key [id number].

## 2018-06-28 NOTE — Telephone Encounter (Signed)
[facility] stating [facility] request was denied and this was given to Cory's asst.

## 2018-06-29 ENCOUNTER — Other Ambulatory Visit: Payer: Self-pay | Admitting: Adult Health

## 2018-06-29 MED ORDER — FESOTERODINE FUMARATE ER 4 MG PO TB24: 4.0000 mg | ORAL_TABLET | Freq: Every day | ORAL | 2 refills | Status: DC

## 2018-06-29 NOTE — Telephone Encounter (Signed)
Received a busy signal at home number.  Message left on cell to return call.

## 2018-06-29 NOTE — Telephone Encounter (Signed)
Pt notified to pick up new rx at the pharmacy. 

## 2018-06-29 NOTE — Telephone Encounter (Signed)
I have sent in a new prescription for a different medication since insurance came back and said they would not cover Oxybutynin.   Sherri Pratt was sent to pharmacy

## 2018-07-20 ENCOUNTER — Other Ambulatory Visit: Payer: Self-pay | Admitting: Cardiovascular Disease

## 2018-08-05 ENCOUNTER — Other Ambulatory Visit: Payer: Self-pay | Admitting: Adult Health

## 2018-08-05 DIAGNOSIS — E139 Other specified diabetes mellitus without complications: Secondary | ICD-10-CM

## 2018-08-06 NOTE — Telephone Encounter (Signed)
Sent to the pharmacy by e-scribe.  Pt has upcoming appt on 09/07/18.

## 2018-08-23 ENCOUNTER — Other Ambulatory Visit: Payer: Self-pay | Admitting: Adult Health

## 2018-08-23 DIAGNOSIS — E7801 Familial hypercholesterolemia: Secondary | ICD-10-CM

## 2018-08-24 NOTE — Telephone Encounter (Signed)
Filled on 06/18/18 for 6 months.  Request is too early.  Denied.

## 2018-09-07 ENCOUNTER — Ambulatory Visit: Payer: Medicare Other | Admitting: Adult Health

## 2018-09-07 ENCOUNTER — Encounter: Payer: Self-pay | Admitting: Adult Health

## 2018-09-07 VITALS — BP 140/70 | Temp 97.8°F | Wt 208.0 lb

## 2018-09-07 DIAGNOSIS — N3946 Mixed incontinence: Secondary | ICD-10-CM | POA: Diagnosis not present

## 2018-09-07 DIAGNOSIS — E139 Other specified diabetes mellitus without complications: Secondary | ICD-10-CM

## 2018-09-07 DIAGNOSIS — Z23 Encounter for immunization: Secondary | ICD-10-CM | POA: Diagnosis not present

## 2018-09-07 LAB — POCT GLYCOSYLATED HEMOGLOBIN (HGB A1C): HBA1C, POC (CONTROLLED DIABETIC RANGE): 6.2 % (ref 0.0–7.0)

## 2018-09-07 MED ORDER — MYRBETRIQ 25 MG PO TB24: 25.0000 mg | ORAL_TABLET | Freq: Every day | ORAL | 3 refills | Status: DC

## 2018-09-07 NOTE — Progress Notes (Signed)
Subjective:    Patient ID: Sherri Pratt, female    DOB: 1946-08-11, 72 y.o.   MRN: [id number]  HPI 72 year old female who  has a past medical history of Anemia, Chronic kidney disease, Diabetes mellitus, Gout, Hyperlipidemia, and Hypertension.  She presents to the office today for follow up regarding DM and urinary incontinence   DM - her last A1c in August was 7.5 this had increased from 7.1. At this time Metformin 250 mg was added to her regimen of glipizide 10 mg BID. She has been monitoring her blood sugars at home and reports readings consistently in the 120's. She denies hypoglycemia episodes or GI issues with metformin   Urinary incontinence - She was given a sample of Myrbetriq 25 mg. She reports today that this has worked well for her and is no longer having issues with incontinence.    Review of Systems See HPI   Past Medical History:  Diagnosis Date  . Anemia   . Chronic kidney disease   . Diabetes mellitus   . Gout   . Hyperlipidemia   . Hypertension     Social History   Socioeconomic History  . Marital status: Married    Spouse name: Not on file  . Number of children: Not on file  . Years of education: Not on file  . Highest education level: Not on file  Occupational History  . Not on file  Social Needs  . Financial resource strain: Not on file  . Food insecurity:    Worry: Not on file    Inability: Not on file  . Transportation needs:    Medical: Not on file    Non-medical: Not on file  Tobacco Use  . Smoking status: Never Smoker  . Smokeless tobacco: Never Used  Substance and Sexual Activity  . Alcohol use: No  . Drug use: No  . Sexual activity: Not on file  Lifestyle  . Physical activity:    Days per week: Not on file    Minutes per session: Not on file  . Stress: Not on file  Relationships  . Social connections:    Talks on phone: Not on file    Gets together: Not on file    Attends religious service: Not on file    Active member of club  or organization: Not on file    Attends meetings of clubs or organizations: Not on file    Relationship status: Not on file  . Intimate partner violence:    Fear of current or ex partner: Not on file    Emotionally abused: Not on file    Physically abused: Not on file    Forced sexual activity: Not on file  Other Topics Concern  . Not on file  Social History Narrative   Retired - She Forensic psychologistsold insurance   Married for 51 years    Three children - all in [place]      She likes to quilting and shopping.     Past Surgical History:  Procedure Laterality Date  . APPENDECTOMY      Family History  Problem Relation Age of Onset  . Cancer Mother        colon  . Hypertension Mother   . Cancer Father        colon    Allergies  Allergen Reactions  . Peanut-Containing Drug Products     Current Outpatient Medications on File Prior to Visit  Medication Sig Dispense Refill  .  Alcohol Swabs (B-D SINGLE USE SWABS REGULAR) PADS USE AS DIRECTED 100 each 1  . allopurinol (ZYLOPRIM) 300 MG tablet TAKE 1 TABLET BY MOUTH ONCE DAILY 90 tablet 2  . apixaban (ELIQUIS) 5 MG TABS tablet Take 1 tablet (5 mg total) by mouth 2 (two) times daily. Schedule follow up appt before next refill authorization 60 tablet 1  . cyanocobalamin (,VITAMIN B-12,) 1000 MCG/ML injection Inject 1,000 mcg into the muscle every 30 (thirty) days.    . fesoterodine (TOVIAZ) 4 MG TB24 tablet Take 1 tablet (4 mg total) by mouth daily. 30 tablet 2  . glipiZIDE (GLUCOTROL) 10 MG tablet TAKE 1 TABLET BY MOUTH TWICE A DAY BEFORE A MEAL 180 tablet 0  . glucose blood (BAYER CONTOUR TEST) test strip 1 each by Other route daily. Dx 250.01 100 each 12  . Lancets (FREESTYLE) lancets Use as instructed 100 each 3  . metoprolol tartrate (LOPRESSOR) 25 MG tablet Take 0.5 tablets (12.5 mg total) by mouth 2 (two) times daily. 90 tablet 3  . Multiple Vitamins-Minerals (PRESERVISION AREDS 2) CAPS Take 1 capsule by mouth 2 (two) times daily.    .  simvastatin (ZOCOR) 20 MG tablet TAKE 1 TABLET BY MOUTH ONCE DAILY AT BEDTIME 90 tablet 1  . metFORMIN (GLUCOPHAGE) 500 MG tablet Take 0.5 tablets (250 mg total) by mouth 2 (two) times daily with a meal. 45 tablet 1  . MYRBETRIQ 25 MG TB24 tablet   0   No current facility-administered medications on file prior to visit.     BP 140/70   Temp 97.8 F (36.6 C)   Wt 208 lb (94.3 kg)   BMI 34.61 kg/m       Objective:   Physical Exam  Constitutional: She is oriented to person, place, and time. She appears well-developed and well-nourished. No distress.  Cardiovascular: Normal rate, regular rhythm, normal heart sounds and intact distal pulses.  Pulmonary/Chest: Effort normal and breath sounds normal.  Neurological: She is alert and oriented to person, place, and time.  Skin: Skin is warm and dry. She is not diaphoretic.  Psychiatric: She has a normal mood and affect. Her behavior is normal. Thought content normal.  Nursing note and vitals reviewed.     Assessment & Plan:  1. Diabetes 1.5, managed as type 1 (HCC)  - POCT A1C - 6.2 - Has improved.  - Continue to monitor blood sugar and follow up in 3 months for CPE. No change in medications   2. Mixed stress and urge urinary incontinence  - MYRBETRIQ 25 MG TB24 tablet; Take 1 tablet (25 mg total) by mouth daily.  Dispense: 90 tablet; Refill: 3  3. Need for influenza vaccination  - Flu vaccine HIGH DOSE PF (Fluzone High dose)   [name], NP

## 2018-09-07 NOTE — Patient Instructions (Signed)
It was great seeing you today   Your A1c has improved to 6.2   Continue to work on diet and exercise as much as you can   Follow up in February for your physical

## 2018-09-11 ENCOUNTER — Other Ambulatory Visit: Payer: Self-pay | Admitting: Adult Health

## 2018-09-11 DIAGNOSIS — E139 Other specified diabetes mellitus without complications: Secondary | ICD-10-CM

## 2018-09-14 NOTE — Telephone Encounter (Signed)
Sent to the pharmacy by e-scribe. 

## 2018-09-27 ENCOUNTER — Encounter: Payer: Self-pay | Admitting: Adult Health

## 2018-09-27 ENCOUNTER — Ambulatory Visit: Payer: Medicare Other | Admitting: Adult Health

## 2018-09-27 VITALS — BP 140/72 | Temp 97.7°F | Wt 193.0 lb

## 2018-09-27 DIAGNOSIS — R3915 Urgency of urination: Secondary | ICD-10-CM

## 2018-09-27 LAB — POC URINALSYSI DIPSTICK (AUTOMATED)
Blood, UA: NEGATIVE
Glucose, UA: NEGATIVE
KETONES UA: NEGATIVE
NITRITE UA: NEGATIVE
PH UA: 5 (ref 5.0–8.0)
PROTEIN UA: POSITIVE — AB
Spec Grav, UA: >=1.03 — AB (ref 1.010–1.025)
UROBILINOGEN UA: 1 U/dL

## 2018-09-27 MED ORDER — CIPROFLOXACIN HCL 500 MG PO TABS: 500.0000 mg | ORAL_TABLET | Freq: Two times a day (BID) | ORAL | 0 refills | Status: DC

## 2018-09-27 NOTE — Progress Notes (Signed)
Subjective:    Patient ID: Sherri Pratt, female    DOB: October 23, 1945, 72 y.o.   MRN: [id number]  HPI  72 year old female who  has a past medical history of Anemia, Chronic kidney disease, Diabetes mellitus, Gout, Hyperlipidemia, and Hypertension.   She presents to the office today for an acute issue of urinary frequency and urgency. She denies dysuria and hematuria.Her symptoms have been present for 2-3 weeks.   She takes Myrbetriq but feels as though it is no longer working   She also reports tremors that started three weeks ago.   She denies fevers or chills.    Review of Systems See HPI   Past Medical History:  Diagnosis Date  . Anemia   . Chronic kidney disease   . Diabetes mellitus   . Gout   . Hyperlipidemia   . Hypertension     Social History   Socioeconomic History  . Marital status: Married    Spouse name: Not on file  . Number of children: Not on file  . Years of education: Not on file  . Highest education level: Not on file  Occupational History  . Not on file  Social Needs  . Financial resource strain: Not on file  . Food insecurity:    Worry: Not on file    Inability: Not on file  . Transportation needs:    Medical: Not on file    Non-medical: Not on file  Tobacco Use  . Smoking status: Never Smoker  . Smokeless tobacco: Never Used  Substance and Sexual Activity  . Alcohol use: No  . Drug use: No  . Sexual activity: Not on file  Lifestyle  . Physical activity:    Days per week: Not on file    Minutes per session: Not on file  . Stress: Not on file  Relationships  . Social connections:    Talks on phone: Not on file    Gets together: Not on file    Attends religious service: Not on file    Active member of club or organization: Not on file    Attends meetings of clubs or organizations: Not on file    Relationship status: Not on file  . Intimate partner violence:    Fear of current or ex partner: Not on file    Emotionally abused: Not  on file    Physically abused: Not on file    Forced sexual activity: Not on file  Other Topics Concern  . Not on file  Social History Narrative   Retired - She Forensic psychologist   Married for 51 years    Three children - all in [place]      She likes to quilting and shopping.     Past Surgical History:  Procedure Laterality Date  . APPENDECTOMY      Family History  Problem Relation Age of Onset  . Cancer Mother        colon  . Hypertension Mother   . Cancer Father        colon    Allergies  Allergen Reactions  . Peanut-Containing Drug Products     Current Outpatient Medications on File Prior to Visit  Medication Sig Dispense Refill  . Alcohol Swabs (B-D SINGLE USE SWABS REGULAR) PADS USE AS DIRECTED 100 each 1  . allopurinol (ZYLOPRIM) 300 MG tablet TAKE 1 TABLET BY MOUTH ONCE DAILY 90 tablet 2  . apixaban (ELIQUIS) 5 MG TABS tablet Take 1  tablet (5 mg total) by mouth 2 (two) times daily. Schedule follow up appt before next refill authorization 60 tablet 1  . cyanocobalamin (,VITAMIN B-12,) 1000 MCG/ML injection Inject 1,000 mcg into the muscle every 30 (thirty) days.    . fesoterodine (TOVIAZ) 4 MG TB24 tablet Take 1 tablet (4 mg total) by mouth daily. 30 tablet 2  . glipiZIDE (GLUCOTROL) 10 MG tablet TAKE 1 TABLET BY MOUTH TWICE A DAY BEFORE A MEAL 180 tablet 0  . glucose blood (BAYER CONTOUR TEST) test strip 1 each by Other route daily. Dx 250.01 100 each 12  . Lancets (FREESTYLE) lancets Use as instructed 100 each 3  . metFORMIN (GLUCOPHAGE) 500 MG tablet TAKE 1/2 TABLETS BY MOUTH 2 TIMES DAILY WITH A MEAL 90 tablet 0  . Multiple Vitamins-Minerals (PRESERVISION AREDS 2) CAPS Take 1 capsule by mouth 2 (two) times daily.    [name]. MYRBETRIQ 25 MG TB24 tablet Take 1 tablet (25 mg total) by mouth daily. 90 tablet 3  . simvastatin (ZOCOR) 20 MG tablet TAKE 1 TABLET BY MOUTH ONCE DAILY AT BEDTIME 90 tablet 1  . metoprolol tartrate (LOPRESSOR) 25 MG tablet Take 0.5 tablets (12.5 mg  total) by mouth 2 (two) times daily. 90 tablet 3   No current facility-administered medications on file prior to visit.     BP 140/72   Temp 97.7 F (36.5 C)   Wt 193 lb (87.5 kg)   BMI 32.12 kg/m       Objective:   Physical Exam Vitals signs and nursing note reviewed.  Constitutional:      Appearance: Normal appearance.  Cardiovascular:     Rate and Rhythm: Normal rate and regular rhythm.  Pulmonary:     Effort: Pulmonary effort is normal.     Breath sounds: Normal breath sounds.  Abdominal:     General: Bowel sounds are normal. There is no distension.     Palpations: There is no mass.     Tenderness: There is no right CVA tenderness or left CVA tenderness.  Skin:    General: Skin is warm and dry.     Capillary Refill: Capillary refill takes less than 2 seconds.  Neurological:     Mental Status: She is alert.     Motor: Tremor present.     Gait: Gait abnormal.     Comments: Slow steady gait with walker  Psychiatric:        Mood and Affect: Mood normal.        Behavior: Behavior normal.        Thought Content: Thought content normal.       Assessment & Plan:  1. Urinary urgency  - POCT Urinalysis Dipstick (Automated) + leuks and protein.  - ciprofloxacin (CIPRO) 500 MG tablet; Take 1 tablet (500 mg total) by mouth 2 (two) times daily.  Dispense: 6 tablet; Refill: 0 - Culture, Urine - follow up in 5 days or sooner if needed - Consider referral to neurology if tremor has not resolved   Shirline Freesory Broxton Broady, NP

## 2018-09-27 NOTE — Patient Instructions (Signed)
I think you have a urinary tract infection.   I have sent in a prescription for Cipro which is an antibiotic, take twice a day for 3 days   Please follow up as directed

## 2018-09-28 LAB — URINE CULTURE
MICRO NUMBER: 91502212
SPECIMEN QUALITY: ADEQUATE

## 2018-10-08 ENCOUNTER — Ambulatory Visit: Payer: Medicare Other | Admitting: Adult Health

## 2018-10-08 ENCOUNTER — Encounter: Payer: Self-pay | Admitting: Adult Health

## 2018-10-08 VITALS — BP 126/72 | HR 67 | Temp 97.6°F | Resp 16 | Ht 65.0 in | Wt 190.0 lb

## 2018-10-08 DIAGNOSIS — R251 Tremor, unspecified: Secondary | ICD-10-CM

## 2018-10-08 LAB — BASIC METABOLIC PANEL
BUN: 15 mg/dL (ref 6–23)
CO2: 25 mEq/L (ref 19–32)
CREATININE: 1.16 mg/dL (ref 0.40–1.20)
Calcium: 9.3 mg/dL (ref 8.4–10.5)
Chloride: 107 mEq/L (ref 96–112)
GFR: 48.56 mL/min — AB (ref >60.00)
Glucose, Bld: 105 mg/dL — ABNORMAL HIGH (ref 70–99)
Potassium: 3.7 mEq/L (ref 3.5–5.1)
Sodium: 144 mEq/L (ref 135–145)

## 2018-10-08 LAB — CBC WITH DIFFERENTIAL/PLATELET
BASOS PCT: 0.5 % (ref 0.0–3.0)
Basophils Absolute: 0.1 10*3/uL (ref 0.0–0.1)
EOS PCT: 0.7 % (ref 0.0–5.0)
Eosinophils Absolute: 0.1 10*3/uL (ref 0.0–0.7)
HEMATOCRIT: 38.8 % (ref 36.0–46.0)
Hemoglobin: 12.6 g/dL (ref 12.0–15.0)
LYMPHS PCT: 15.9 % (ref 12.0–46.0)
Lymphs Abs: 1.6 10*3/uL (ref 0.7–4.0)
MCHC: 32.5 g/dL (ref 30.0–36.0)
MCV: 82.4 fl (ref 78.0–100.0)
MONOS PCT: 4.9 % (ref 3.0–12.0)
Monocytes Absolute: 0.5 10*3/uL (ref 0.1–1.0)
NEUTROS ABS: 8 10*3/uL — AB (ref 1.4–7.7)
Neutrophils Relative %: 78 % — ABNORMAL HIGH (ref 43.0–77.0)
PLATELETS: 225 10*3/uL (ref 150.0–400.0)
RBC: 4.72 Mil/uL (ref 3.87–5.11)
RDW: 16.1 % — AB (ref 11.5–15.5)
WBC: 10.2 10*3/uL (ref 4.0–10.5)

## 2018-10-08 LAB — TSH: TSH: 0.78 u[IU]/mL (ref 0.35–4.50)

## 2018-10-08 NOTE — Progress Notes (Signed)
Subjective:    Patient ID: Gaspar BiddingJudy C Myung, female    DOB: [birth year], 72 y.o.   MRN: [id number]  HPI 72 year old female who  has a past medical history of Anemia, Chronic kidney disease, Diabetes mellitus, Gout, Hyperlipidemia, and Hypertension.  She presents to the office today for follow up regarding new onset tremor in upper extremities R>L. Her tremor started suddenly about a month and a half ago. She has not had any difficulty feeding herself and when the tremors start she feels as though she can voluntarily stop them. There have not been any recent medication changes.    She denies family history of parkinson's disease.  Denies recent falls. Does not feel as though tremor has become worse over the last 6 weeks.    Review of Systems See HPI   Past Medical History:  Diagnosis Date  . Anemia   . Chronic kidney disease   . Diabetes mellitus   . Gout   . Hyperlipidemia   . Hypertension     Social History   Socioeconomic History  . Marital status: Married    Spouse name: Not on file  . Number of children: Not on file  . Years of education: Not on file  . Highest education level: Not on file  Occupational History  . Not on file  Social Needs  . Financial resource strain: Not on file  . Food insecurity:    Worry: Not on file    Inability: Not on file  . Transportation needs:    Medical: Not on file    Non-medical: Not on file  Tobacco Use  . Smoking status: Never Smoker  . Smokeless tobacco: Never Used  Substance and Sexual Activity  . Alcohol use: No  . Drug use: No  . Sexual activity: Not on file  Lifestyle  . Physical activity:    Days per week: Not on file    Minutes per session: Not on file  . Stress: Not on file  Relationships  . Social connections:    Talks on phone: Not on file    Gets together: Not on file    Attends religious service: Not on file    Active member of club or organization: Not on file    Attends meetings of clubs or organizations:  Not on file    Relationship status: Not on file  . Intimate partner violence:    Fear of current or ex partner: Not on file    Emotionally abused: Not on file    Physically abused: Not on file    Forced sexual activity: Not on file  Other Topics Concern  . Not on file  Social History Narrative   Retired - She Forensic psychologistsold insurance   Married for 51 years    Three children - all in       She likes to quilting and shopping.     Past Surgical History:  Procedure Laterality Date  . APPENDECTOMY      Family History  Problem Relation Age of Onset  . Cancer Mother        colon  . Hypertension Mother   . Cancer Father        colon    Allergies  Allergen Reactions  . Peanut-Containing Drug Products     Current Outpatient Medications on File Prior to Visit  Medication Sig Dispense Refill  . Alcohol Swabs (B-D SINGLE USE SWABS REGULAR) PADS USE AS DIRECTED 100 each 1  .  allopurinol (ZYLOPRIM) 300 MG tablet TAKE 1 TABLET BY MOUTH ONCE DAILY 90 tablet 2  . apixaban (ELIQUIS) 5 MG TABS tablet Take 1 tablet (5 mg total) by mouth 2 (two) times daily. Schedule follow up appt before next refill authorization 60 tablet 1  . cyanocobalamin (,VITAMIN B-12,) 1000 MCG/ML injection Inject 1,000 mcg into the muscle every 30 (thirty) days.    . fesoterodine (TOVIAZ) 4 MG TB24 tablet Take 1 tablet (4 mg total) by mouth daily. 30 tablet 2  . glipiZIDE (GLUCOTROL) 10 MG tablet TAKE 1 TABLET BY MOUTH TWICE A DAY BEFORE A MEAL 180 tablet 0  . glucose blood (BAYER CONTOUR TEST) test strip 1 each by Other route daily. Dx 250.01 100 each 12  . Lancets (FREESTYLE) lancets Use as instructed 100 each 3  . metFORMIN (GLUCOPHAGE) 500 MG tablet TAKE 1/2 TABLETS BY MOUTH 2 TIMES DAILY WITH A MEAL 90 tablet 0  . Multiple Vitamins-Minerals (PRESERVISION AREDS 2) CAPS Take 1 capsule by mouth 2 (two) times daily.    [name]. MYRBETRIQ 25 MG TB24 tablet Take 1 tablet (25 mg total) by mouth daily. 90 tablet 3  . simvastatin  (ZOCOR) 20 MG tablet TAKE 1 TABLET BY MOUTH ONCE DAILY AT BEDTIME 90 tablet 1  . metoprolol tartrate (LOPRESSOR) 25 MG tablet Take 0.5 tablets (12.5 mg total) by mouth 2 (two) times daily. 90 tablet 3   No current facility-administered medications on file prior to visit.     BP 126/72 (BP Location: Left Arm, Patient Position: Sitting, Cuff Size: Normal)   Pulse 67   Temp 97.6 F (36.4 C) (Oral)   Resp 16   Ht 5\' 5"  (1.651 m)   Wt 190 lb (86.2 kg)   SpO2 97%   BMI 31.62 kg/m       Objective:   Physical Exam Vitals signs and nursing note reviewed.  Constitutional:      Appearance: Normal appearance.  Musculoskeletal:     Comments: Slow steady gait    Neurological:     General: No focal deficit present.     Mental Status: She is alert.     Sensory: No sensory deficit.     Motor: Tremor (Right upper extremity> left upper extremity) present. No weakness.     Coordination: Coordination normal.     Deep Tendon Reflexes: Reflexes normal.     Comments: Jerking sensation with right wrist/hand.  No pill rolling behavior  No cog wheel       Assessment & Plan:  1. Tremor - Will refer to neurology for new onset of tremors - TSH - CBC with Differential/Platelet - Basic Metabolic Panel - Ambulatory referral to Neurology  Shirline Freesory Aliveah Gallant, NP

## 2018-10-11 NOTE — Progress Notes (Deleted)
Sherri FraiseJudy C Pratt was seen today in the movement disorders clinic for neurologic consultation at the request of Sherri Pratt, Cory, NP.  The consultation is for the evaluation of tremor.  Tremor: {yes no:314532}   How long has it been going on? ***2 months  At rest or with activation?  ***  When is it noted the most?  ***  Fam hx of tremor?  {yes [id number]}no:314532}  Located where?  ***  Affected by caffeine:  {yes no:314532}  Affected by alcohol:  {yes no:314532}  Affected by stress:  {yes no:314532}  Affected by fatigue:  {yes no:314532}  Spills soup if on spoon:  {yes no:314532}  Spills glass of liquid if full:  {yes no:314532}  Affects ADL's (tying shoes, brushing teeth, etc):  {yes no:314532}  Tremor inducing meds:  {yes no:314532}  Other Specific Symptoms:  Voice: *** Sleep: ***  Vivid Dreams:  {yes no:314532}  Acting out dreams:  {yes no:314532} Wet Pillows: {yes no:314532} Postural symptoms:  {yes no:314532}  Falls?  {yes no:314532} Bradykinesia symptoms: {parkinson brady:18041} Loss of smell:  {yes no:314532} Loss of taste:  {yes no:314532} Urinary Incontinence:  {yes no:314532} Difficulty Swallowing:  {yes no:314532} Handwriting, micrographia: {yes no:314532} Trouble with ADL's:  {yes no:314532}  Trouble buttoning clothing: {yes no:314532} Depression:  {yes no:314532} Memory changes:  {yes no:314532} Hallucinations:  {yes no:314532}  visual distortions: {yes no:314532} N/V:  {yes no:314532} Lightheaded:  {yes no:314532}  Syncope: {yes no:314532} Diplopia:  {yes no:314532} Dyskinesia:  {yes no:314532}  Neuroimaging of the brain has *** previously been performed.  It *** available for my review today.  She had a CT of the brain in 2018 that demonstrated a mild to moderate atrophy and small vessel disease.  PREVIOUS MEDICATIONS: {Parkinson's RX:18200}  ALLERGIES:   Allergies  Allergen Reactions  . Peanut-Containing Drug Products     CURRENT MEDICATIONS:  Outpatient  Encounter Medications as of 10/14/2018  Medication Sig  . Alcohol Swabs (B-D SINGLE USE SWABS REGULAR) PADS USE AS DIRECTED  . allopurinol (ZYLOPRIM) 300 MG tablet TAKE 1 TABLET BY MOUTH ONCE DAILY  . apixaban (ELIQUIS) 5 MG TABS tablet Take 1 tablet (5 mg total) by mouth 2 (two) times daily. Schedule follow up appt before next refill authorization  . cyanocobalamin (,VITAMIN B-12,) 1000 MCG/ML injection Inject 1,000 mcg into the muscle every 30 (thirty) days.  . fesoterodine (TOVIAZ) 4 MG TB24 tablet Take 1 tablet (4 mg total) by mouth daily.  [name]. glipiZIDE (GLUCOTROL) 10 MG tablet TAKE 1 TABLET BY MOUTH TWICE A DAY BEFORE A MEAL  . glucose blood (BAYER CONTOUR TEST) test strip 1 each by Other route daily. Dx 250.01  . Lancets (FREESTYLE) lancets Use as instructed  . metFORMIN (GLUCOPHAGE) 500 MG tablet TAKE 1/2 TABLETS BY MOUTH 2 TIMES DAILY WITH A MEAL  . metoprolol tartrate (LOPRESSOR) 25 MG tablet Take 0.5 tablets (12.5 mg total) by mouth 2 (two) times daily.  . Multiple Vitamins-Minerals (PRESERVISION AREDS 2) CAPS Take 1 capsule by mouth 2 (two) times daily.  [name]. MYRBETRIQ 25 MG TB24 tablet Take 1 tablet (25 mg total) by mouth daily.  . simvastatin (ZOCOR) 20 MG tablet TAKE 1 TABLET BY MOUTH ONCE DAILY AT BEDTIME   No facility-administered encounter medications on file as of 10/14/2018.     PAST MEDICAL HISTORY:   Past Medical History:  Diagnosis Date  . Anemia   . Chronic kidney disease   . Diabetes mellitus   . Gout   . Hyperlipidemia   .  Hypertension     PAST SURGICAL HISTORY:   Past Surgical History:  Procedure Laterality Date  . APPENDECTOMY      SOCIAL HISTORY:   Social History   Socioeconomic History  . Marital status: Married    Spouse name: Not on file  . Number of children: Not on file  . Years of education: Not on file  . Highest education level: Not on file  Occupational History  . Not on file  Social Needs  . Financial resource strain: Not on file  . Food  insecurity:    Worry: Not on file    Inability: Not on file  . Transportation needs:    Medical: Not on file    Non-medical: Not on file  Tobacco Use  . Smoking status: Never Smoker  . Smokeless tobacco: Never Used  Substance and Sexual Activity  . Alcohol use: No  . Drug use: No  . Sexual activity: Not on file  Lifestyle  . Physical activity:    Days per week: Not on file    Minutes per session: Not on file  . Stress: Not on file  Relationships  . Social connections:    Talks on phone: Not on file    Gets together: Not on file    Attends religious service: Not on file    Active member of club or organization: Not on file    Attends meetings of clubs or organizations: Not on file    Relationship status: Not on file  . Intimate partner violence:    Fear of current or ex partner: Not on file    Emotionally abused: Not on file    Physically abused: Not on file    Forced sexual activity: Not on file  Other Topics Concern  . Not on file  Social History Narrative   Retired - She Forensic psychologist   Married for 51 years    Three children - all in [place]      She likes to quilting and shopping.     FAMILY HISTORY:   Family Status  Relation Name Status  . Mother  Deceased  . Father  Deceased    ROS:  ROS  PHYSICAL EXAMINATION:    VITALS:  There were no vitals filed for this visit.  GEN:  The patient appears stated age and is in NAD. HEENT:  Normocephalic, atraumatic.  The mucous membranes are moist. The superficial temporal arteries are without ropiness or tenderness. CV:  RRR Lungs:  CTAB Neck/HEME:  There are no carotid bruits bilaterally.  Neurological examination:  Orientation: The patient is alert and oriented x3. Fund of knowledge is appropriate.  Recent and remote memory are intact.  Attention and concentration are normal.    Able to name objects and repeat phrases. Cranial nerves: There is good facial symmetry. Pupils are equal round and reactive to light  bilaterally. Fundoscopic exam reveals clear margins bilaterally. Extraocular muscles are intact. The visual fields are full to confrontational testing. The speech is fluent and clear. Soft palate rises symmetrically and there is no tongue deviation. Hearing is intact to conversational tone. Sensation: Sensation is intact to light and pinprick throughout (facial, trunk, extremities). Vibration is intact at the bilateral big toe. There is no extinction with double simultaneous stimulation. There is no sensory dermatomal level identified. Motor: Strength is 5/5 in the bilateral upper and lower extremities.   Shoulder shrug is equal and symmetric.  There is no pronator drift. Deep tendon reflexes: Deep tendon  reflexes are 2/4 at the bilateral biceps, triceps, brachioradialis, patella and achilles. Plantar responses are downgoing bilaterally.  Movement examination: Tone: There is ***tone in the bilateral upper extremities.  The tone in the lower extremities is ***.  Abnormal movements: *** Coordination:  There is *** decremation with RAM's, *** Gait and Station: The patient has *** difficulty arising out of a deep-seated chair without the use of the hands. The patient's stride length is ***.  The patient has a *** pull test.      ASSESSMENT/PLAN:  ***  Cc:  Sherri Pratt, Cory, NP

## 2018-10-12 ENCOUNTER — Encounter (HOSPITAL_COMMUNITY): Payer: Self-pay

## 2018-10-12 ENCOUNTER — Emergency Department (HOSPITAL_COMMUNITY): Payer: Medicare Other

## 2018-10-12 ENCOUNTER — Observation Stay (HOSPITAL_COMMUNITY)
Admission: EM | Admit: 2018-10-12 | Discharge: 2018-10-15 | Disposition: A | Discharge status: Home / Self Care | Payer: Medicare Other | Attending: Internal Medicine | Admitting: Internal Medicine

## 2018-10-12 ENCOUNTER — Other Ambulatory Visit: Payer: Self-pay

## 2018-10-12 DIAGNOSIS — Z8679 Personal history of other diseases of the circulatory system: Secondary | ICD-10-CM

## 2018-10-12 DIAGNOSIS — I10 Essential (primary) hypertension: Secondary | ICD-10-CM | POA: Diagnosis present

## 2018-10-12 DIAGNOSIS — E876 Hypokalemia: Secondary | ICD-10-CM | POA: Diagnosis not present

## 2018-10-12 DIAGNOSIS — S0003XA Contusion of scalp, initial encounter: Secondary | ICD-10-CM

## 2018-10-12 DIAGNOSIS — W19XXXA Unspecified fall, initial encounter: Secondary | ICD-10-CM | POA: Insufficient documentation

## 2018-10-12 DIAGNOSIS — S299XXA Unspecified injury of thorax, initial encounter: Secondary | ICD-10-CM | POA: Diagnosis not present

## 2018-10-12 DIAGNOSIS — I480 Paroxysmal atrial fibrillation: Secondary | ICD-10-CM | POA: Diagnosis present

## 2018-10-12 DIAGNOSIS — R0902 Hypoxemia: Secondary | ICD-10-CM | POA: Diagnosis not present

## 2018-10-12 DIAGNOSIS — S065X0A Traumatic subdural hemorrhage without loss of consciousness, initial encounter: Secondary | ICD-10-CM | POA: Insufficient documentation

## 2018-10-12 DIAGNOSIS — I129 Hypertensive chronic kidney disease with stage 1 through stage 4 chronic kidney disease, or unspecified chronic kidney disease: Secondary | ICD-10-CM | POA: Insufficient documentation

## 2018-10-12 DIAGNOSIS — Y92009 Unspecified place in unspecified non-institutional (private) residence as the place of occurrence of the external cause: Secondary | ICD-10-CM | POA: Diagnosis not present

## 2018-10-12 DIAGNOSIS — Y999 Unspecified external cause status: Secondary | ICD-10-CM | POA: Diagnosis not present

## 2018-10-12 DIAGNOSIS — R2681 Unsteadiness on feet: Secondary | ICD-10-CM | POA: Insufficient documentation

## 2018-10-12 DIAGNOSIS — Y939 Activity, unspecified: Secondary | ICD-10-CM | POA: Insufficient documentation

## 2018-10-12 DIAGNOSIS — E785 Hyperlipidemia, unspecified: Secondary | ICD-10-CM | POA: Diagnosis not present

## 2018-10-12 DIAGNOSIS — R55 Syncope and collapse: Secondary | ICD-10-CM | POA: Diagnosis not present

## 2018-10-12 DIAGNOSIS — Z79899 Other long term (current) drug therapy: Secondary | ICD-10-CM | POA: Insufficient documentation

## 2018-10-12 DIAGNOSIS — E11649 Type 2 diabetes mellitus with hypoglycemia without coma: Secondary | ICD-10-CM | POA: Diagnosis present

## 2018-10-12 DIAGNOSIS — Z7984 Long term (current) use of oral hypoglycemic drugs: Secondary | ICD-10-CM | POA: Insufficient documentation

## 2018-10-12 DIAGNOSIS — N189 Chronic kidney disease, unspecified: Secondary | ICD-10-CM | POA: Diagnosis not present

## 2018-10-12 DIAGNOSIS — S065X9A Traumatic subdural hemorrhage with loss of consciousness of unspecified duration, initial encounter: Secondary | ICD-10-CM | POA: Diagnosis not present

## 2018-10-12 DIAGNOSIS — E782 Mixed hyperlipidemia: Secondary | ICD-10-CM | POA: Diagnosis present

## 2018-10-12 DIAGNOSIS — R9431 Abnormal electrocardiogram [ECG] [EKG]: Secondary | ICD-10-CM

## 2018-10-12 DIAGNOSIS — I472 Ventricular tachycardia: Secondary | ICD-10-CM | POA: Diagnosis not present

## 2018-10-12 DIAGNOSIS — S065XAA Traumatic subdural hemorrhage with loss of consciousness status unknown, initial encounter: Secondary | ICD-10-CM | POA: Diagnosis present

## 2018-10-12 DIAGNOSIS — R000 Tachycardia, unspecified: Secondary | ICD-10-CM

## 2018-10-12 DIAGNOSIS — S0990XA Unspecified injury of head, initial encounter: Secondary | ICD-10-CM | POA: Diagnosis present

## 2018-10-12 DIAGNOSIS — S199XXA Unspecified injury of neck, initial encounter: Secondary | ICD-10-CM | POA: Diagnosis not present

## 2018-10-12 DIAGNOSIS — E161 Other hypoglycemia: Secondary | ICD-10-CM | POA: Diagnosis not present

## 2018-10-12 DIAGNOSIS — E162 Hypoglycemia, unspecified: Secondary | ICD-10-CM | POA: Diagnosis not present

## 2018-10-12 LAB — CBG MONITORING, ED
GLUCOSE-CAPILLARY: 233 mg/dL — AB (ref 70–99)
Glucose-Capillary: 45 mg/dL — ABNORMAL LOW (ref 70–99)

## 2018-10-12 LAB — BASIC METABOLIC PANEL
Anion gap: 11 (ref 5–15)
BUN: 10 mg/dL (ref 8–23)
CALCIUM: 9 mg/dL (ref 8.9–10.3)
CO2: 21 mmol/L — AB (ref 22–32)
Chloride: 109 mmol/L (ref 98–111)
Creatinine, Ser: 1.11 mg/dL — ABNORMAL HIGH (ref 0.44–1.00)
GFR calc Af Amer: 57 mL/min — ABNORMAL LOW (ref >60)
GFR calc non Af Amer: 49 mL/min — ABNORMAL LOW (ref >60)
Glucose, Bld: 74 mg/dL (ref 70–99)
Potassium: 3.3 mmol/L — ABNORMAL LOW (ref 3.5–5.1)
Sodium: 141 mmol/L (ref 135–145)

## 2018-10-12 LAB — CBC
HCT: 39.4 % (ref 36.0–46.0)
Hemoglobin: 12.2 g/dL (ref 12.0–15.0)
MCH: 26.3 pg (ref 26.0–34.0)
MCHC: 31 g/dL (ref 30.0–36.0)
MCV: 84.9 fL (ref 80.0–100.0)
Platelets: 208 10*3/uL (ref 150–400)
RBC: 4.64 MIL/uL (ref 3.87–5.11)
RDW: 14.7 % (ref 11.5–15.5)
WBC: 9 10*3/uL (ref 4.0–10.5)
nRBC: 0 % (ref 0.0–0.2)

## 2018-10-12 LAB — GLUCOSE, CAPILLARY: Glucose-Capillary: 211 mg/dL — ABNORMAL HIGH (ref 70–99)

## 2018-10-12 LAB — PROTIME-INR
INR: 1.37
Prothrombin Time: 16.7 seconds — ABNORMAL HIGH (ref 11.4–15.2)

## 2018-10-12 LAB — MAGNESIUM: Magnesium: 1.6 mg/dL — ABNORMAL LOW (ref 1.7–2.4)

## 2018-10-12 MED ORDER — SODIUM CHLORIDE 0.9% FLUSH
3.0000 mL | Freq: Two times a day (BID) | INTRAVENOUS | Status: DC
  Administered 2018-10-12 – 2018-10-14 (×5): 3 mL via INTRAVENOUS

## 2018-10-12 MED ORDER — MAGNESIUM SULFATE 2 GM/50ML IV SOLN
2.0000 g | Freq: Once | INTRAVENOUS | Status: AC
  Administered 2018-10-13: 2 g via INTRAVENOUS
  Filled 2018-10-12: qty 50

## 2018-10-12 MED ORDER — METOPROLOL TARTRATE 12.5 MG HALF TABLET
12.5000 mg | ORAL_TABLET | Freq: Two times a day (BID) | ORAL | Status: DC
  Administered 2018-10-12 – 2018-10-15 (×6): 12.5 mg via ORAL
  Filled 2018-10-12 (×7): qty 1

## 2018-10-12 MED ORDER — POTASSIUM CHLORIDE CRYS ER 20 MEQ PO TBCR
40.0000 meq | EXTENDED_RELEASE_TABLET | Freq: Once | ORAL | Status: AC
  Administered 2018-10-12: 40 meq via ORAL
  Filled 2018-10-12: qty 2

## 2018-10-12 MED ORDER — INSULIN ASPART 100 UNIT/ML ~~LOC~~ SOLN
0.0000 [IU] | Freq: Three times a day (TID) | SUBCUTANEOUS | Status: DC
  Administered 2018-10-13: 1 [IU] via SUBCUTANEOUS
  Administered 2018-10-13: 3 [IU] via SUBCUTANEOUS
  Administered 2018-10-13 – 2018-10-14 (×2): 2 [IU] via SUBCUTANEOUS
  Administered 2018-10-14: 3 [IU] via SUBCUTANEOUS
  Administered 2018-10-15: 5 [IU] via SUBCUTANEOUS

## 2018-10-12 MED ORDER — ACETAMINOPHEN 325 MG PO TABS
650.0000 mg | ORAL_TABLET | Freq: Four times a day (QID) | ORAL | Status: DC | PRN
  Administered 2018-10-13: 650 mg via ORAL
  Filled 2018-10-12: qty 2

## 2018-10-12 MED ORDER — ALLOPURINOL 300 MG PO TABS
300.0000 mg | ORAL_TABLET | Freq: Every day | ORAL | Status: DC
  Administered 2018-10-13 – 2018-10-15 (×3): 300 mg via ORAL
  Filled 2018-10-12: qty 3
  Filled 2018-10-12 (×3): qty 1
  Filled 2018-10-12 (×2): qty 3

## 2018-10-12 MED ORDER — ACETAMINOPHEN 650 MG RE SUPP: 650.0000 mg | Freq: Four times a day (QID) | RECTAL | Status: DC | PRN

## 2018-10-12 MED ORDER — SIMVASTATIN 20 MG PO TABS
20.0000 mg | ORAL_TABLET | Freq: Every day | ORAL | Status: DC
  Administered 2018-10-12 – 2018-10-14 (×3): 20 mg via ORAL
  Filled 2018-10-12 (×3): qty 1

## 2018-10-12 MED ORDER — ALBUTEROL SULFATE (2.5 MG/3ML) 0.083% IN NEBU: 2.5000 mg | INHALATION_SOLUTION | Freq: Four times a day (QID) | RESPIRATORY_TRACT | Status: DC | PRN

## 2018-10-12 MED ORDER — ONDANSETRON HCL 4 MG PO TABS: 4.0000 mg | ORAL_TABLET | Freq: Four times a day (QID) | ORAL | Status: DC | PRN

## 2018-10-12 MED ORDER — ONDANSETRON HCL 4 MG/2ML IJ SOLN: 4.0000 mg | Freq: Four times a day (QID) | INTRAMUSCULAR | Status: DC | PRN

## 2018-10-12 MED ORDER — DEXTROSE 50 % IV SOLN
50.0000 mL | Freq: Once | INTRAVENOUS | Status: AC
  Administered 2018-10-12: 50 mL via INTRAVENOUS
  Filled 2018-10-12: qty 50

## 2018-10-12 NOTE — ED Notes (Signed)
Attempted to call report

## 2018-10-12 NOTE — ED Triage Notes (Signed)
[facility]- pt coming from home had a witnessed syncopal episode by her husband. CBG initially 54 but improved to 102 after peanut butter. She is alert and oriented. Pt was noted to be in atrial flutter rate as high as 180. She was given 5mg  of metoprolol with no improvement.

## 2018-10-12 NOTE — Progress Notes (Signed)
Pt [facility] awake alert and oriented x 4  with slow responses. Initial assessment done.  Red areas with red rashes to perineum and abdominal folds related to moisture associated dermatitis with some foul smell.  A bath given and antifungal powder applied. Sacral also red and blanchable. Cardiac monitor applied and verification done. POC explained to pt. call bell within reach to call for assist. And pt demonstrate understanding. Will continue to monitor.

## 2018-10-12 NOTE — ED Provider Notes (Signed)
[facility] Provider Note   CSN: [id number] Arrival date & time: 10/12/18  1729     History   Chief Complaint Chief Complaint  Patient presents with  . Loss of Consciousness  . Tachycardia    HPI Gaspar BiddingJudy C Camplin is a 72 y.o. female.  Patient s/p syncopal event at home in hall. Witnessed by spouse. Pt poor historian - doesn't recall events or what caused her to fall. Ems notes in wide complex tachy on arrival, hr 180-200 - spontaneously converted to afib with controlled rate just prior to arrival. Hx afib. On anticoag therapy. Patient denies feeling sick or ill earlier today. Had not eaten today - ems noted blood sugar 54 that improved to 102 post fluids/snack. Pt denies headache. No neck or back pain. No chest pain or discomfort. No sob. No cough or uri symptoms. No fever or chills. No abd pain. No nvd. Denies melena, vaginal bleeding or recent blood loss. Denies recent change in meds.   The history is provided by the patient.  Loss of Consciousness   Pertinent negatives include abdominal pain, back pain, chest pain, confusion, fever, headaches, vomiting and weakness.    Past Medical History:  Diagnosis Date  . Anemia   . Chronic kidney disease   . Diabetes mellitus   . Gout   . Hyperlipidemia   . Hypertension     Patient Active Problem List   Diagnosis Date Noted  . Paroxysmal atrial fibrillation (HCC) 04/17/2017  . Hypophosphatemia 04/17/2017  . Hypomagnesemia 04/17/2017  . Acute lower UTI 04/17/2017  . Hyperthyroidism 04/17/2017  . Anemia 11/08/2012  . GOUT 11/12/2010  . Chronic kidney disease 03/27/2009  . Diabetes 1.5, managed as type 1 (HCC) 09/21/2007  . Hyperlipidemia 09/21/2007  . Essential hypertension 09/21/2007    Past Surgical History:  Procedure Laterality Date  . APPENDECTOMY       OB History   No obstetric history on file.      Home Medications    Prior to Admission medications   Medication Sig  Start Date End Date Taking? Authorizing Provider  Alcohol Swabs (B-D SINGLE USE SWABS REGULAR) PADS USE AS DIRECTED 12/10/16   [name], [name], NP  allopurinol (ZYLOPRIM) 300 MG tablet TAKE 1 TABLET BY MOUTH ONCE DAILY 02/02/18   [name], [name], NP  apixaban (ELIQUIS) 5 MG TABS tablet Take 1 tablet (5 mg total) by mouth 2 (two) times daily. Schedule follow up appt before next refill authorization 07/20/18   Croitoru, Rachelle HoraMihai, MD  cyanocobalamin (,VITAMIN B-12,) 1000 MCG/ML injection Inject 1,000 mcg into the muscle every 30 (thirty) days.    [provider]  fesoterodine (TOVIAZ) 4 MG TB24 tablet Take 1 tablet (4 mg total) by mouth daily. 06/29/18   [name], [name], NP  glipiZIDE (GLUCOTROL) 10 MG tablet TAKE 1 TABLET BY MOUTH TWICE A DAY BEFORE A MEAL 08/06/18   [name], [name], NP  glucose blood (BAYER CONTOUR TEST) test strip 1 each by Other route daily. Dx 250.01 11/09/13   Roderick Peeodd, Jeffrey A, MD  Lancets (FREESTYLE) lancets Use as instructed 03/04/18   Shirline FreesNafziger, Cory, NP  metFORMIN (GLUCOPHAGE) 500 MG tablet TAKE 1/2 TABLETS BY MOUTH 2 TIMES DAILY WITH A MEAL 09/14/18   [name], [name], NP  metoprolol tartrate (LOPRESSOR) 25 MG tablet Take 0.5 tablets (12.5 mg total) by mouth 2 (two) times daily. 09/25/17 09/20/18  Croitoru, Mihai, MD  Multiple Vitamins-Minerals (PRESERVISION AREDS 2) CAPS Take 1 capsule by mouth 2 (two) times daily.  [provider]  MYRBETRIQ 25 MG TB24 tablet Take 1 tablet (25 mg total) by mouth daily. 09/07/18   [name], Kandee Keen, NP  simvastatin (ZOCOR) 20 MG tablet TAKE 1 TABLET BY MOUTH ONCE DAILY AT BEDTIME 06/18/18   [name], NP    Family History Family History  Problem Relation Age of Onset  . Cancer Mother        colon  . Hypertension Mother   . Cancer Father        colon    Social History Social History   Tobacco Use  . Smoking status: Never Smoker  . Smokeless tobacco: Never Used  Substance Use Topics  . Alcohol use: No  . Drug use: No      Allergies   Peanut-containing drug products   Review of Systems Review of Systems  Constitutional: Negative for fever.  HENT: Negative for sore throat.   Eyes: Negative for visual disturbance.  Respiratory: Negative for cough and shortness of breath.   Cardiovascular: Positive for syncope. Negative for chest pain.  Gastrointestinal: Negative for abdominal pain, blood in stool, diarrhea and vomiting.  Endocrine: Negative for polyuria.  Genitourinary: Negative for dysuria and flank pain.  Musculoskeletal: Negative for back pain and neck pain.  Skin: Negative for rash.  Neurological: Negative for weakness, numbness and headaches.  Hematological:       On anticoag therapy for afib.   Psychiatric/Behavioral: Negative for confusion.     Physical Exam Updated Vital Signs SpO2 98%   Physical Exam Vitals signs and nursing note reviewed.  Constitutional:      Appearance: Normal appearance. She is well-developed.  HENT:     Head:     Comments: Contusion posterior scalp.     Nose: Nose normal.     Mouth/Throat:     Mouth: Mucous membranes are moist.     Pharynx: Oropharynx is clear.  Eyes:     General: No scleral icterus.    Conjunctiva/sclera: Conjunctivae normal.     Pupils: Pupils are equal, round, and reactive to light.  Neck:     Musculoskeletal: Normal range of motion and neck supple. No neck rigidity or muscular tenderness.     Vascular: No carotid bruit.     Trachea: No tracheal deviation.  Cardiovascular:     Rate and Rhythm: Normal rate.     Pulses: Normal pulses.     Heart sounds: Normal heart sounds. No murmur. No friction rub. No gallop.   Pulmonary:     Effort: Pulmonary effort is normal. No respiratory distress.     Breath sounds: Normal breath sounds.  Chest:     Chest wall: No tenderness.  Abdominal:     General: Bowel sounds are normal. There is no distension.     Palpations: Abdomen is soft. There is no mass.     Tenderness: There is no  abdominal tenderness. There is no guarding.  Genitourinary:    Comments: No cva tenderness.  Musculoskeletal:     Comments: Mild mid cervical tenderness, otherwise, CTLS spine, non tender, aligned, no step off. Good rom bil ext without pain or focal bony tenderness.   Skin:    General: Skin is warm and dry.     Findings: No rash.  Neurological:     Mental Status: She is alert.     Comments: Alert, oriented. Speech slow, but clear. Motor intact bil, stre 5/5. sens grossly intact.   Psychiatric:        Mood and Affect:  Mood normal.      ED Treatments / Results  Labs (all labs ordered are listed, but only abnormal results are displayed) Results for orders placed or performed during the hospital encounter of 10/12/18  Basic metabolic panel  Result Value Ref Range   Sodium 141 135 - 145 mmol/L   Potassium 3.3 (L) 3.5 - 5.1 mmol/L   Chloride 109 98 - 111 mmol/L   CO2 21 (L) 22 - 32 mmol/L   Glucose, Bld 74 70 - 99 mg/dL   BUN 10 8 - 23 mg/dL   Creatinine, Ser 1.61 (H) 0.44 - 1.00 mg/dL   Calcium 9.0 8.9 - 09.6 mg/dL   GFR calc non Af Amer 49 (L) >60 mL/min   GFR calc Af Amer 57 (L) >60 mL/min   Anion gap 11 5 - 15  CBC  Result Value Ref Range   WBC 9.0 4.0 - 10.5 K/uL   RBC 4.64 3.87 - 5.11 MIL/uL   Hemoglobin 12.2 12.0 - 15.0 g/dL   HCT 04.5 40.9 - 81.1 %   MCV 84.9 80.0 - 100.0 fL   MCH 26.3 26.0 - 34.0 pg   MCHC 31.0 30.0 - 36.0 g/dL   RDW 91.4 78.2 - 95.6 %   Platelets 208 150 - 400 K/uL   nRBC 0.0 0.0 - 0.2 %  Protime-INR- (order if Patient is taking Coumadin / Warfarin)  Result Value Ref Range   Prothrombin Time 16.7 (H) 11.4 - 15.2 seconds   INR 1.37     EKG EKG Interpretation  Date/Time:  Tuesday October 12 2018 17:35:44 EST Ventricular Rate:  65 PR Interval:    QRS Duration: 160 QT Interval:  500 QTC Calculation: 520 R Axis:   12 Text Interpretation:  Atrial fibrillation Nonspecific intraventricular conduction delay Nonspecific T wave abnormality  Confirmed by Cathren Laine ([id number]) on 10/12/2018 5:38:06 PM   Radiology Ct Head Wo Contrast  Result Date: 10/12/2018 CLINICAL DATA:  Minor head trauma.  Syncope. EXAM: CT HEAD WITHOUT CONTRAST CT CERVICAL SPINE WITHOUT CONTRAST TECHNIQUE: Multidetector CT imaging of the head and cervical spine was performed following the standard protocol without intravenous contrast. Multiplanar CT image reconstructions of the cervical spine were also generated. COMPARISON:  Head CT 04/15/2017 FINDINGS: CT HEAD FINDINGS Brain: Minimal high-density thickening of the falx, see 4:29, measuring 3 mm in thickness. This is only convincing given comparison with 04/15/2017 head CT. Mild cerebral volume loss and chronic small vessel ischemic change in the white matter. No acute infarct, hydrocephalus, or or masslike finding Vascular: Atherosclerotic calcification Skull: Extensive scalp hematoma posteriorly on the right. No underlying calvarial fracture. Sinuses/Orbits: Negative Critical Value/emergent results were called by telephone at the time of interpretation on 10/12/2018 at 6:41 pm to Dr. Cathren Laine , who verbally acknowledged these results. CT CERVICAL SPINE FINDINGS Alignment: No traumatic malalignment Skull base and vertebrae: Negative for fracture Soft tissues and spinal canal: No prevertebral fluid or swelling. No visible canal hematoma. Thyroid nodules measuring up to 3 cm on the right. Disc levels: Prominent C5-6 and C6-7 disc degeneration with disc protrusions and buttressing osteophyte impinging on the ventral cord. Upper chest: Negative IMPRESSION: 1. Trace falcine subdural hemorrhage that is convincing based on a 2018 comparison. 2. Posterior scalp contusion without calvarial fracture. 3. Negative for cervical spine fracture. Electronically Signed   By: Marnee Spring M.D.   On: 10/12/2018 18:44   Ct Cervical Spine Wo Contrast  Result Date: 10/12/2018 CLINICAL DATA:  Minor head trauma.  Syncope. EXAM: CT HEAD  WITHOUT CONTRAST CT CERVICAL SPINE WITHOUT CONTRAST TECHNIQUE: Multidetector CT imaging of the head and cervical spine was performed following the standard protocol without intravenous contrast. Multiplanar CT image reconstructions of the cervical spine were also generated. COMPARISON:  Head CT 04/15/2017 FINDINGS: CT HEAD FINDINGS Brain: Minimal high-density thickening of the falx, see 4:29, measuring 3 mm in thickness. This is only convincing given comparison with 04/15/2017 head CT. Mild cerebral volume loss and chronic small vessel ischemic change in the white matter. No acute infarct, hydrocephalus, or or masslike finding Vascular: Atherosclerotic calcification Skull: Extensive scalp hematoma posteriorly on the right. No underlying calvarial fracture. Sinuses/Orbits: Negative Critical Value/emergent results were called by telephone at the time of interpretation on 10/12/2018 at 6:41 pm to Dr. Cathren LaineKEVIN Arron Tetrault , who verbally acknowledged these results. CT CERVICAL SPINE FINDINGS Alignment: No traumatic malalignment Skull base and vertebrae: Negative for fracture Soft tissues and spinal canal: No prevertebral fluid or swelling. No visible canal hematoma. Thyroid nodules measuring up to 3 cm on the right. Disc levels: Prominent C5-6 and C6-7 disc degeneration with disc protrusions and buttressing osteophyte impinging on the ventral cord. Upper chest: Negative IMPRESSION: 1. Trace falcine subdural hemorrhage that is convincing based on a 2018 comparison. 2. Posterior scalp contusion without calvarial fracture. 3. Negative for cervical spine fracture. Electronically Signed   By: Marnee SpringJonathon  Watts M.D.   On: 10/12/2018 18:44   Dg Chest Portable 1 View  Result Date: 10/12/2018 CLINICAL DATA:  Syncope with fall today.  Atrial fibrillation. EXAM: PORTABLE CHEST 1 VIEW COMPARISON:  09/02/2005 FINDINGS: Heart size is within normal limits considering the AP portable technique. Pulmonary vascularity is normal. Lungs are  clear. No significant bone abnormality. IMPRESSION: No active disease. Electronically Signed   By: Francene BoyersJames  Maxwell M.D.   On: 10/12/2018 18:10    Procedures Procedures (including critical care time)  Medications Ordered in ED Medications - No data to display   Initial Impression / Assessment and Plan / ED Course  I have reviewed the triage vital signs and the nursing notes.  Pertinent labs & imaging results that were available during my care of the patient were reviewed by me and considered in my medical decision making (see chart for details).  Iv ns. Continuous pulse ox and monitor. Ecg. Stat labs and imaging.  Reviewed nursing notes and prior charts for additional history.   Reviewed EMS rhythm strips, wide complex tachy, hr 200.   Ct reviewed - neurosurgery on call consulted - they reviewed images - states just hold eliquis, no other specific tx or reversal is needed. Admit to medicine.  Hospitalists consulted for admission.   Labs reviewed - k sl low. kcl po.     Final Clinical Impressions(s) / ED Diagnoses   Final diagnoses:  None    ED Discharge Orders    None       Cathren LaineSteinl, Demetrias Goodbar, MD 10/12/18 [phone]

## 2018-10-12 NOTE — ED Notes (Signed)
Admitting MD ( Dr. Arlyss Queen. Smith) notified on CBG result.

## 2018-10-12 NOTE — H&P (Addendum)
History and Physical    Sherri Pratt ZOX:[id number] DOB: 04/05/1946 DOA: 10/12/2018  Referring MD/NP/PA: Cathren Laine, MD PCP: [name], NP  Patient coming from: Home via EMS  Chief Complaint: Fall  I have personally briefly reviewed patient's old medical records in [facility]   HPI: Sherri Pratt is a 72 y.o. female with medical history significant of PAF on Eliquis, HTN, HLD, DM, gout, and CKD stage III; who presents after having a fall.  Patient lives at home with her husband and ambulates with use of a walker.  Patient calls that her husband called out to her and the next thing she recalls is lying on the floor.  Her husband witnessed the fall, but has dementia and unable to provide history.  The patient's son found her lying on the floor disoriented not acting like normal.  She normally does not feel lightheaded or dizzy when changing positions.  Her sons report that she has been having episodes of low blood sugars.  He reports that she does not check her blood sugar regularly and does not eat regularly.  She is on glipizide and metformin for treatment of her diabetes.  Currently patient denies any recent fever, history of seizures, chest pain, palpitations, nausea, vomiting, diarrhea, dysuria, or Parkinson's dz. patient has also had multiple falls with the last occurring sometime last month.  Lastly, family makes note that over the last few weeks she had developed a constant tremor of bilateral upper extremities that is present even at rest.  En route with EMS patient was noted to be in a wide-complex tachycardia with heart rates 180-200 that spontaneously converted to atrial fibrillation with controlled rate.  Initial blood sugar was 54 and improved with fluids and snack to 102.  ED Course: Upon admission into the emergency department patient was noted to be afebrile vital signs relatively within normal limits.  Labs revealed normal CBC, potassium 3.3, BUN 10, creatinine 1.11,  and glucose 74.  CT scans of the head and cervical spine revealed trace falcine subdural hemorrhage with posterior scalp contusion without fracture.  [place] [facility] was consulted and will see the patient.  Patient was given 40 mEq of potassium chloride and an amp of dextrose.  TRH called to admit.  Review of Systems  Constitutional: Negative for chills and fever.  HENT: Negative for congestion and ear discharge.   Eyes: Negative for photophobia and pain.  Respiratory: Negative for cough and shortness of breath.   Cardiovascular: Negative for chest pain and orthopnea.  Gastrointestinal: Negative for abdominal pain, nausea and vomiting.  Genitourinary: Negative for dysuria and hematuria.  Musculoskeletal: Positive for falls.  Skin: Negative for itching.  Neurological: Positive for tremors and loss of consciousness. Negative for focal weakness.  Psychiatric/Behavioral: Negative for memory loss and substance abuse.    Past Medical History:  Diagnosis Date  . Anemia   . Chronic kidney disease   . Diabetes mellitus   . Gout   . Hyperlipidemia   . Hypertension     Past Surgical History:  Procedure Laterality Date  . APPENDECTOMY       reports that she has never smoked. She has never used smokeless tobacco. She reports that she does not drink alcohol or use drugs.  Allergies  Allergen Reactions  . Peanut-Containing Drug Products     Family History  Problem Relation Age of Onset  . Cancer Mother        colon  . Hypertension Mother   .  Cancer Father        colon    Prior to Admission medications   Medication Sig Start Date End Date Taking? Authorizing Provider  Alcohol Swabs (B-D SINGLE USE SWABS REGULAR) PADS USE AS DIRECTED 12/10/16   [name], [name], NP  allopurinol (ZYLOPRIM) 300 MG tablet TAKE 1 TABLET BY MOUTH ONCE DAILY 02/02/18   [name], [name], NP  apixaban (ELIQUIS) 5 MG TABS tablet Take 1 tablet (5 mg total) by mouth 2 (two) times daily. Schedule follow up appt  before next refill authorization 07/20/18   Croitoru, Rachelle HoraMihai, MD  cyanocobalamin (,VITAMIN B-12,) 1000 MCG/ML injection Inject 1,000 mcg into the muscle every 30 (thirty) days.    [provider]  fesoterodine (TOVIAZ) 4 MG TB24 tablet Take 1 tablet (4 mg total) by mouth daily. 06/29/18   [name], [name], NP  glipiZIDE (GLUCOTROL) 10 MG tablet TAKE 1 TABLET BY MOUTH TWICE A DAY BEFORE A MEAL 08/06/18   [name], [name], NP  glucose blood (BAYER CONTOUR TEST) test strip 1 each by Other route daily. Dx 250.01 11/09/13   Roderick Peeodd, Jeffrey A, MD  Lancets (FREESTYLE) lancets Use as instructed 03/04/18   Shirline FreesNafziger, Cory, NP  metFORMIN (GLUCOPHAGE) 500 MG tablet TAKE 1/2 TABLETS BY MOUTH 2 TIMES DAILY WITH A MEAL 09/14/18   [name], [name], NP  metoprolol tartrate (LOPRESSOR) 25 MG tablet Take 0.5 tablets (12.5 mg total) by mouth 2 (two) times daily. 09/25/17 09/20/18  Croitoru, Mihai, MD  Multiple Vitamins-Minerals (PRESERVISION AREDS 2) CAPS Take 1 capsule by mouth 2 (two) times daily.    [provider]  MYRBETRIQ 25 MG TB24 tablet Take 1 tablet (25 mg total) by mouth daily. 09/07/18   [name], [name], NP  simvastatin (ZOCOR) 20 MG tablet TAKE 1 TABLET BY MOUTH ONCE DAILY AT BEDTIME 06/18/18   [name], [name], NP    Physical Exam:  Constitutional: Healthy female who appears to be in NAD, calm, comfortable Vitals:   10/12/18 1825 10/12/18 1830 10/12/18 1845 10/12/18 1916  BP:  (!) 159/72 140/67 (!) 147/75  Pulse:  66 64 62  Resp:  16 (!) 21 16  Temp: (!) 97.5 F (36.4 C)     TempSrc: Oral     SpO2:  99% 96% 99%   Eyes: PERRL, lids and conjunctivae normal ENMT: Mucous membranes are moist. Posterior pharynx clear of any exudate or lesions.  Neck: normal, supple, no masses, no thyromegaly Respiratory: clear to auscultation bilaterally, no wheezing, no crackles. Normal respiratory effort. No accessory muscle use.  Cardiovascular: Irregular irregular, no murmurs / rubs / gallops. No extremity  edema. 2+ pedal pulses. No carotid bruits.  Abdomen: no tenderness, no masses palpated. No hepatosplenomegaly. Bowel sounds positive.  Musculoskeletal: no clubbing / cyanosis. No joint deformity upper and lower extremities. Good ROM, no contractures. Normal muscle tone.  Skin: Bruising of the posterior scalp. Neurologic: CN 2-12 grossly intact. Sensation intact, tremor present bilateral upper extremities noted even at rest.  Strength 5/5 in all 4.  Psychiatric: Normal judgment and insight. Alert and oriented x 3. Normal mood.     Labs on Admission: I have personally reviewed following labs and imaging studies  CBC: Recent Labs  Lab 10/08/18 1123 10/12/18 1742  WBC 10.2 9.0  NEUTROABS 8.0*  --   HGB 12.6 12.2  HCT 38.8 39.4  MCV 82.4 84.9  PLT 225.0 208   Basic Metabolic Panel: Recent Labs  Lab 10/08/18 1123 10/12/18 1742  NA 144 141  K 3.7 3.3*  CL  107 109  CO2 25 21*  GLUCOSE 105* 74  BUN 15 10  CREATININE 1.16 1.11*  CALCIUM 9.3 9.0   GFR: Estimated Creatinine Clearance: 49 mL/min (A) (by C-G formula based on SCr of 1.11 mg/dL (H)). Liver Function Tests: No results for input(s): AST, ALT, ALKPHOS, BILITOT, PROT, ALBUMIN in the last 168 hours. No results for input(s): LIPASE, AMYLASE in the last 168 hours. No results for input(s): AMMONIA in the last 168 hours. Coagulation Profile: Recent Labs  Lab 10/12/18 1742  INR 1.37   Cardiac Enzymes: No results for input(s): CKTOTAL, CKMB, CKMBINDEX, TROPONINI in the last 168 hours. BNP (last 3 results) No results for input(s): PROBNP in the last 8760 hours. HbA1C: No results for input(s): HGBA1C in the last 72 hours. CBG: No results for input(s): GLUCAP in the last 168 hours. Lipid Profile: No results for input(s): CHOL, HDL, LDLCALC, TRIG, CHOLHDL, LDLDIRECT in the last 72 hours. Thyroid Function Tests: No results for input(s): TSH, T4TOTAL, FREET4, T3FREE, THYROIDAB in the last 72 hours. Anemia Panel: No  results for input(s): VITAMINB12, FOLATE, FERRITIN, TIBC, IRON, RETICCTPCT in the last 72 hours. Urine analysis:    Component Value Date/Time   COLORURINE YELLOW 04/16/2017 2200   APPEARANCEUR HAZY (A) 04/16/2017 2200   LABSPEC 1.013 04/16/2017 2200   PHURINE 5.0 04/16/2017 2200   GLUCOSEU NEGATIVE 04/16/2017 2200   HGBUR NEGATIVE 04/16/2017 2200   HGBUR negative 11/04/2010 0921   BILIRUBINUR 1+ 09/27/2018 1156   KETONESUR NEGATIVE 04/16/2017 2200   PROTEINUR Positive (A) 09/27/2018 1156   PROTEINUR NEGATIVE 04/16/2017 2200   UROBILINOGEN 1.0 09/27/2018 1156   UROBILINOGEN 0.2 11/04/2010 0921   NITRITE n 09/27/2018 1156   NITRITE POSITIVE (A) 04/16/2017 2200   LEUKOCYTESUR Moderate (2+) (A) 09/27/2018 1156   Sepsis Labs: No results found for this or any previous visit (from the past 240 hour(s)).   Radiological Exams on Admission: Ct Head Wo Contrast  Result Date: 10/12/2018 CLINICAL DATA:  Minor head trauma.  Syncope. EXAM: CT HEAD WITHOUT CONTRAST CT CERVICAL SPINE WITHOUT CONTRAST TECHNIQUE: Multidetector CT imaging of the head and cervical spine was performed following the standard protocol without intravenous contrast. Multiplanar CT image reconstructions of the cervical spine were also generated. COMPARISON:  Head CT 04/15/2017 FINDINGS: CT HEAD FINDINGS Brain: Minimal high-density thickening of the falx, see 4:29, measuring 3 mm in thickness. This is only convincing given comparison with 04/15/2017 head CT. Mild cerebral volume loss and chronic small vessel ischemic change in the white matter. No acute infarct, hydrocephalus, or or masslike finding Vascular: Atherosclerotic calcification Skull: Extensive scalp hematoma posteriorly on the right. No underlying calvarial fracture. Sinuses/Orbits: Negative Critical Value/emergent results were called by telephone at the time of interpretation on 10/12/2018 at 6:41 pm to Dr. Cathren Laine , who verbally acknowledged these results. CT  CERVICAL SPINE FINDINGS Alignment: No traumatic malalignment Skull base and vertebrae: Negative for fracture Soft tissues and spinal canal: No prevertebral fluid or swelling. No visible canal hematoma. Thyroid nodules measuring up to 3 cm on the right. Disc levels: Prominent C5-6 and C6-7 disc degeneration with disc protrusions and buttressing osteophyte impinging on the ventral cord. Upper chest: Negative IMPRESSION: 1. Trace falcine subdural hemorrhage that is convincing based on a 2018 comparison. 2. Posterior scalp contusion without calvarial fracture. 3. Negative for cervical spine fracture. Electronically Signed   By: Marnee Spring M.D.   On: 10/12/2018 18:44   Ct Cervical Spine Wo Contrast  Result Date: 10/12/2018 CLINICAL DATA:  Minor head trauma.  Syncope. EXAM: CT HEAD WITHOUT CONTRAST CT CERVICAL SPINE WITHOUT CONTRAST TECHNIQUE: Multidetector CT imaging of the head and cervical spine was performed following the standard protocol without intravenous contrast. Multiplanar CT image reconstructions of the cervical spine were also generated. COMPARISON:  Head CT 04/15/2017 FINDINGS: CT HEAD FINDINGS Brain: Minimal high-density thickening of the falx, see 4:29, measuring 3 mm in thickness. This is only convincing given comparison with 04/15/2017 head CT. Mild cerebral volume loss and chronic small vessel ischemic change in the white matter. No acute infarct, hydrocephalus, or or masslike finding Vascular: Atherosclerotic calcification Skull: Extensive scalp hematoma posteriorly on the right. No underlying calvarial fracture. Sinuses/Orbits: Negative Critical Value/emergent results were called by telephone at the time of interpretation on 10/12/2018 at 6:41 pm to Dr. Cathren LaineKEVIN STEINL , who verbally acknowledged these results. CT CERVICAL SPINE FINDINGS Alignment: No traumatic malalignment Skull base and vertebrae: Negative for fracture Soft tissues and spinal canal: No prevertebral fluid or swelling. No  visible canal hematoma. Thyroid nodules measuring up to 3 cm on the right. Disc levels: Prominent C5-6 and C6-7 disc degeneration with disc protrusions and buttressing osteophyte impinging on the ventral cord. Upper chest: Negative IMPRESSION: 1. Trace falcine subdural hemorrhage that is convincing based on a 2018 comparison. 2. Posterior scalp contusion without calvarial fracture. 3. Negative for cervical spine fracture. Electronically Signed   By: Marnee SpringJonathon  Watts M.D.   On: 10/12/2018 18:44   Dg Chest Portable 1 View  Result Date: 10/12/2018 CLINICAL DATA:  Syncope with fall today.  Atrial fibrillation. EXAM: PORTABLE CHEST 1 VIEW COMPARISON:  09/02/2005 FINDINGS: Heart size is within normal limits considering the AP portable technique. Pulmonary vascularity is normal. Lungs are clear. No significant bone abnormality. IMPRESSION: No active disease. Electronically Signed   By: Francene BoyersJames  Maxwell M.D.   On: 10/12/2018 18:10    EKG: Independently reviewed.  Atrial fibrillation at 65 bpm with nonspecific intraventricular conduction delay QTc calculated at 520.  Assessment/Plan Syncope and Collapse with subdural hematoma: Acute.  Patient presents after having a fall.  Patient initially found to be hypoglycemic and tachycardic with heart rates into the 180-200s.  Suspect patient likely had syncopal event causing onset of symptoms. - Admit to a progressive bed - Neurochecks - Trend troponin - Follow-up telemetry overnight - Appreciate neurosurgery consultative services, will follow-up for further recommendations  Arrhythmia, atrial fibrillation on chronic anticoagulation: Patient was noted to have heart rates into the 180-200s prior to arrival with EMS.  She is followed by Dr. [name] cardiology.  Family makes note that the patient has had recent falls.  CHA2DS2-VASc score = 4(age, sex, HTN, and DM) - Check and replace electrolyte abnormalities - Hold Eliquis due to subdural hematoma - Continue  metoprolol - May want to discuss with cardiology in a.m.  Prolonged QT interval: Initial QTc calculated at 520. - Check magnesium and replace if needed - Recheck EKG in a.m.  Diabetes mellitus type II with hypoglycemia: Patient noted blood sugars as low as 54 requiring and amp of dextrose.  Last hemoglobin A1c noted to be 7.5 on 8/22. - Hypoglycemic protocols - Check hemoglobin A1c in a.m.  - Hold metformin and glipizide(would consider discontinuing this medication due to reports of low blood sugars) - CBGs q. before meals and at bedtime with sensitive SSI with meals   Hypokalemia: Acute. Initial potassium noted be 3.3 on admission.  Patient was given 40 mEq of potassium chloride. - Continue to monitor and replace as needed  Tremor: Acute.  Symptoms present over the last few weeks per family.  Referral to neurology has been placed in the outpatient setting.  Chronic kidney disease stage 3: Stable.  Patient creatinine noted to be 1.11 on admission which appears slightly improved from previous baseline. - Continue to monitor  History of gout - Continue allopurinol  Hyperlipidemia - Continue simvastatin  DVT prophylaxis: SCD Code Status: Full Family Communication: Discussed plan of care with the patient family present at bedside Disposition Plan: Likely discharge home in 1 to 2 days once medically stable Consults called: neurosurgery  Admission status: observation  Clydie Braun MD [facility] Pager [phone]   If 7PM-7AM, please contact night-coverage [email or web address] Password [facility]  10/12/2018, 7:26 PM

## 2018-10-13 ENCOUNTER — Other Ambulatory Visit: Payer: Self-pay

## 2018-10-13 DIAGNOSIS — E876 Hypokalemia: Secondary | ICD-10-CM | POA: Diagnosis not present

## 2018-10-13 DIAGNOSIS — I10 Essential (primary) hypertension: Secondary | ICD-10-CM | POA: Diagnosis not present

## 2018-10-13 DIAGNOSIS — N182 Chronic kidney disease, stage 2 (mild): Secondary | ICD-10-CM | POA: Diagnosis not present

## 2018-10-13 DIAGNOSIS — I472 Ventricular tachycardia: Secondary | ICD-10-CM

## 2018-10-13 DIAGNOSIS — S065X0A Traumatic subdural hemorrhage without loss of consciousness, initial encounter: Secondary | ICD-10-CM | POA: Diagnosis not present

## 2018-10-13 DIAGNOSIS — E785 Hyperlipidemia, unspecified: Secondary | ICD-10-CM

## 2018-10-13 DIAGNOSIS — R55 Syncope and collapse: Secondary | ICD-10-CM | POA: Diagnosis not present

## 2018-10-13 DIAGNOSIS — R251 Tremor, unspecified: Secondary | ICD-10-CM

## 2018-10-13 DIAGNOSIS — R9431 Abnormal electrocardiogram [ECG] [EKG]: Secondary | ICD-10-CM | POA: Diagnosis present

## 2018-10-13 DIAGNOSIS — I480 Paroxysmal atrial fibrillation: Secondary | ICD-10-CM

## 2018-10-13 DIAGNOSIS — S065XAA Traumatic subdural hemorrhage with loss of consciousness status unknown, initial encounter: Secondary | ICD-10-CM | POA: Diagnosis present

## 2018-10-13 DIAGNOSIS — E162 Hypoglycemia, unspecified: Secondary | ICD-10-CM

## 2018-10-13 DIAGNOSIS — S065X9A Traumatic subdural hemorrhage with loss of consciousness of unspecified duration, initial encounter: Secondary | ICD-10-CM | POA: Diagnosis present

## 2018-10-13 DIAGNOSIS — R296 Repeated falls: Secondary | ICD-10-CM

## 2018-10-13 LAB — TROPONIN I
Troponin I: <0.03 ng/mL (ref <0.03)
Troponin I: <0.03 ng/mL (ref <0.03)
Troponin I: <0.03 ng/mL (ref <0.03)

## 2018-10-13 LAB — CBC
HCT: 34.7 % — ABNORMAL LOW (ref 36.0–46.0)
Hemoglobin: 11 g/dL — ABNORMAL LOW (ref 12.0–15.0)
MCH: 26.6 pg (ref 26.0–34.0)
MCHC: 31.7 g/dL (ref 30.0–36.0)
MCV: 83.8 fL (ref 80.0–100.0)
Platelets: 204 10*3/uL (ref 150–400)
RBC: 4.14 MIL/uL (ref 3.87–5.11)
RDW: 14.8 % (ref 11.5–15.5)
WBC: 5.8 10*3/uL (ref 4.0–10.5)
nRBC: 0 % (ref 0.0–0.2)

## 2018-10-13 LAB — GLUCOSE, CAPILLARY
Glucose-Capillary: 167 mg/dL — ABNORMAL HIGH (ref 70–99)
Glucose-Capillary: 223 mg/dL — ABNORMAL HIGH (ref 70–99)
Glucose-Capillary: 142 mg/dL — ABNORMAL HIGH (ref 70–99)
Glucose-Capillary: 247 mg/dL — ABNORMAL HIGH (ref 70–99)

## 2018-10-13 LAB — BASIC METABOLIC PANEL
Anion gap: 11 (ref 5–15)
BUN: 11 mg/dL (ref 8–23)
CO2: 22 mmol/L (ref 22–32)
Calcium: 8.7 mg/dL — ABNORMAL LOW (ref 8.9–10.3)
Chloride: 107 mmol/L (ref 98–111)
Creatinine, Ser: 1.09 mg/dL — ABNORMAL HIGH (ref 0.44–1.00)
GFR calc non Af Amer: 50 mL/min — ABNORMAL LOW (ref >60)
GFR, EST AFRICAN AMERICAN: 58 mL/min — AB (ref >60)
Glucose, Bld: 217 mg/dL — ABNORMAL HIGH (ref 70–99)
Potassium: 3.6 mmol/L (ref 3.5–5.1)
Sodium: 140 mmol/L (ref 135–145)

## 2018-10-13 LAB — HEMOGLOBIN A1C
HEMOGLOBIN A1C: 5.2 % (ref 4.8–5.6)
Mean Plasma Glucose: 102.54 mg/dL

## 2018-10-13 LAB — MAGNESIUM: Magnesium: 2.3 mg/dL (ref 1.7–2.4)

## 2018-10-13 NOTE — Progress Notes (Signed)
PROGRESS NOTE    Sherri Pratt  ZOX:[id number] DOB: 1946-08-24 DOA: 10/12/2018 PCP: Shirline FreesNafziger, Cory, NP   Brief Narrative:  HPI per Dr. Madelyn Flavorsondell Smith on 10/12/18 Sherri Pratt is a 73 y.o. female with medical history significant of PAF on Eliquis, HTN, HLD, DM, gout, and CKD stage III; who presents after having a fall.  Patient lives at home with her husband and ambulates with use of a walker.  Patient calls that her husband called out to her and the next thing she recalls is lying on the floor.  Her husband witnessed the fall, but has dementia and unable to provide history.  The patient's son found her lying on the floor disoriented not acting like normal.  She normally does not feel lightheaded or dizzy when changing positions.  Her sons report that she has been having episodes of low blood sugars.  He reports that she does not check her blood sugar regularly and does not eat regularly.  She is on glipizide and metformin for treatment of her diabetes.  Currently patient denies any recent fever, history of seizures, chest pain, palpitations, nausea, vomiting, diarrhea, dysuria, or Parkinson's dz. patient has also had multiple falls with the last occurring sometime last month.  Lastly, family makes note that over the last few weeks she had developed a constant tremor of bilateral upper extremities that is present even at rest.  En route with EMS patient was noted to be in a wide-complex tachycardia with heart rates 180-200 that spontaneously converted to atrial fibrillation with controlled rate.  Initial blood sugar was 54 and improved with fluids and snack to 102.  ED Course: Upon admission into the emergency department patient was noted to be afebrile vital signs relatively within normal limits.  Labs revealed normal CBC, potassium 3.3, BUN 10, creatinine 1.11, and glucose 74.  CT scans of the head and cervical spine revealed trace falcine subdural hemorrhage with posterior scalp contusion without  fracture.  [place] neurosurgery was consulted and will see the patient.  Patient was given 40 mEq of potassium chloride and an amp of dextrose.  [facility] called to admit  **Surgery evaluated and they recommend no acute neurosurgical intervention at this time and recommending holding Eliquis for least 2 weeks and they also recommend no repeat imaging at this time if patient continues to do well clinically.  Audiology was consulted for near syncope and feel that her hypoglycemia may have initiated this.  They recommended continue Lopressor feel this is nothing to suggest ischemic heart disease as a potential etiology and recommending outpatient monitoring if warranted.  Assessment & Plan:   Principal Problem:   Syncope and collapse Active Problems:   Hyperlipidemia   Essential hypertension   Chronic kidney disease   Paroxysmal atrial fibrillation (HCC)   Type 2 diabetes mellitus with hypoglycemia without coma (HCC)   Prolonged QT interval   Hypokalemia   SDH (subdural hematoma) (HCC)  Syncope and Collapse with subdural hematoma  -Acute. Patient presents after having a fall.  Patient initially found to be hypoglycemic and tachycardic with heart rates into the 180-200s.  Suspect patient likely had syncopal event causing onset of symptoms.  Cardiology feels that was mediated by her severe hypoglycemia -Placed in Obs Telemetry -C/w Neurochecks -Trend troponin and were <0.03 x3 -Follow-up telemetry overnight -Appreciate neurosurgery consultative services and they recommend holding Eliquis at least 2 weeks and recommend no Neurosurgical Intervention; They also recommend no repeat Imaging if doing well clinically -Will get PT to  Evaluate and Treat -  Arrhythmia, Atrial Fibrillation on Chronic Anticoagulation -Now in NSR in the 60's -Patient was noted to have heart rates into the 180-200s prior to arrival with EMS.   -She is followed by Dr. Royann Shivers cardiology. Cardiology consulted for further  evaluation and recommendations  -Family makes note that the patient has had recent falls.   -CHA2DS2-VASc score = 4(age, sex, HTN, and DM) -Check and replace electrolyte abnormalities - Hold Eliquis due to subdural hematoma but Cardiology recommending possibly holding altogether -Continue metoprolol 12.5 mg po BID    Prolonged QT Interval -Initial QTc calculated at 520. -Check magnesium and replace if needed -Recheck EKG in a.m.  Diabetes Mellitus Type II with Hypoglycemia  -Patient noted blood sugars as low as 54 requiring and amp of dextrose.   -Last hemoglobin A1c noted to be 7.5 on 8/22. -Hypoglycemic protocols -Check hemoglobin A1c in a.m and was 5.2 -Hold metformin and glipizide(would consider discontinuing this medication due to reports of low blood sugars) -CBGs q. before meals and at bedtime with sensitive SSI with meals  -Will consult Diabetes Education Coordinator  -CBG's ranging from 142-223  Hypokalemia -Acute. Initial potassium noted be 3.3 on admission.   -Patient was given 40 mEq of potassium chloride. -K+ is now 3.6 and Mg is 2.3 -Continue to monitor and Replete as needed -Repeat CMP in AM   Tremor: -Acute.   -Symptoms present over the last few weeks per family.  -Referral to neurology has been placed in the outpatient setting.  Chronic Kidney Disease Stage 3 -Stable.  Patient creatinine noted to be 1.11 on admission which appears slightly improved from previous baseline. This AM BUN/Cr was 11/1.09 -Continue to monitor and Trend Renal Fxn -Repeat CMP in AM   History of Gout -Continue allopurinol  Hyperlipidemia -Continue simvastatin  Obesity -Estimated body mass index is 30.14 kg/m as calculated from the following:   Height as of this encounter: 5\' 7"  (1.702 m).   Weight as of this encounter: 87.3 kg. -Weight Loss Counseling given  DVT prophylaxis: SCDs; Eliquis being held Code Status: FULL CODE Family Communication: Discussed with Son at  bedside Disposition Plan: Pending PT/OT Evaluations  Consultants:   NeuroSurgery  Cardiology   Procedures: None   Antimicrobials:  Anti-infectives (From admission, onward)   None     Subjective: Examined at bedside doing well.  Denying chest pain, lightheadedness or dizziness.  No nausea or vomiting.  States that she is been resting.  No headaches or blurred vision. No other concerns or complaints at this time  Objective: Vitals:   10/13/18 0006 10/13/18 0427 10/13/18 0820 10/13/18 1318  BP: (!) 132/53 (!) 136/59 (!) 152/61 (!) 121/51  Pulse: 63 60 61 61  Resp: 19 18 15 20   Temp: 98.8 F (37.1 C) 98.9 F (37.2 C) 98.3 F (36.8 C) (!) 97.4 F (36.3 C)  TempSrc: Oral Oral Oral Oral  SpO2: 96% 97% 95% 96%  Weight:      Height:        Intake/Output Summary (Last 24 hours) at 10/13/2018 1708 Last data filed at 10/13/2018 0500 Gross per 24 hour  Intake 690 ml  Output -  Net 690 ml   Filed Weights   10/12/18 2205  Weight: 87.3 kg   Examination: Physical Exam:  Constitutional: WN/WD obese Caucasian female in NAD and appears calm and comfortable Eyes: Lids and conjunctivae normal, sclerae anicteric  ENMT: External Ears, Nose appear normal. Grossly normal hearing. Mucous membranes are moist.. Neck: Appears  normal, supple, no cervical masses, normal ROM, no appreciable thyromegaly; no JVD Respiratory: Diminished to auscultation bilaterally, no wheezing, rales, rhonchi or crackles. Normal respiratory effort and patient is not tachypenic. No accessory muscle use.  Cardiovascular: RRR but on the slower side, no murmurs / rubs / gallops. S1 and S2 auscultated. Trace extremity edema. .  Abdomen: Soft, non-tender, Distended 2/2 to body habitus. No masses palpated. No appreciable hepatosplenomegaly. Bowel sounds positive x4.  GU: Deferred. Musculoskeletal: No clubbing / cyanosis of digits/nails. No joint deformity upper and lower extremities.  Skin: No rashes, lesions, ulcers  on a limited skin evaluation. No induration; Warm and dry.  Neurologic: CN 2-12 grossly intact with no focal deficits.  Romberg sign and cerebellar reflexes not assessed.  Psychiatric: Normal judgment and insight. Alert and oriented x 3. Normal mood and appropriate affect.   Data Reviewed: I have personally reviewed following labs and imaging studies  CBC: Recent Labs  Lab 10/08/18 1123 10/12/18 1742 10/13/18 0432  WBC 10.2 9.0 5.8  NEUTROABS 8.0*  --   --   HGB 12.6 12.2 11.0*  HCT 38.8 39.4 34.7*  MCV 82.4 84.9 83.8  PLT 225.0 208 204   Basic Metabolic Panel: Recent Labs  Lab 10/08/18 1123 10/12/18 1742 10/12/18 1935 10/13/18 0432  NA 144 141  --  140  K 3.7 3.3*  --  3.6  CL 107 109  --  107  CO2 25 21*  --  22  GLUCOSE 105* 74  --  217*  BUN 15 10  --  11  CREATININE 1.16 1.11*  --  1.09*  CALCIUM 9.3 9.0  --  8.7*  MG  --   --  1.6* 2.3   GFR: Estimated Creatinine Clearance: 52.2 mL/min (A) (by C-G formula based on SCr of 1.09 mg/dL (H)). Liver Function Tests: No results for input(s): AST, ALT, ALKPHOS, BILITOT, PROT, ALBUMIN in the last 168 hours. No results for input(s): LIPASE, AMYLASE in the last 168 hours. No results for input(s): AMMONIA in the last 168 hours. Coagulation Profile: Recent Labs  Lab 10/12/18 1742  INR 1.37   Cardiac Enzymes: Recent Labs  Lab 10/12/18 2304 10/13/18 0432 10/13/18 0928  TROPONINI <0.03 <0.03 <0.03   BNP (last 3 results) No results for input(s): PROBNP in the last 8760 hours. HbA1C: Recent Labs    10/13/18 0432  HGBA1C 5.2   CBG: Recent Labs  Lab 10/12/18 2049 10/12/18 2228 10/13/18 0618 10/13/18 1130 10/13/18 1614  GLUCAP 233* 211* 167* 223* 142*   Lipid Profile: No results for input(s): CHOL, HDL, LDLCALC, TRIG, CHOLHDL, LDLDIRECT in the last 72 hours. Thyroid Function Tests: No results for input(s): TSH, T4TOTAL, FREET4, T3FREE, THYROIDAB in the last 72 hours. Anemia Panel: No results for  input(s): VITAMINB12, FOLATE, FERRITIN, TIBC, IRON, RETICCTPCT in the last 72 hours. Sepsis Labs: No results for input(s): PROCALCITON, LATICACIDVEN in the last 168 hours.  No results found for this or any previous visit (from the past 240 hour(s)).   Radiology Studies: Ct Head Wo Contrast  Result Date: 10/12/2018 CLINICAL DATA:  Minor head trauma.  Syncope. EXAM: CT HEAD WITHOUT CONTRAST CT CERVICAL SPINE WITHOUT CONTRAST TECHNIQUE: Multidetector CT imaging of the head and cervical spine was performed following the standard protocol without intravenous contrast. Multiplanar CT image reconstructions of the cervical spine were also generated. COMPARISON:  Head CT 04/15/2017 FINDINGS: CT HEAD FINDINGS Brain: Minimal high-density thickening of the falx, see 4:29, measuring 3 mm in thickness. This is  only convincing given comparison with 04/15/2017 head CT. Mild cerebral volume loss and chronic small vessel ischemic change in the white matter. No acute infarct, hydrocephalus, or or masslike finding Vascular: Atherosclerotic calcification Skull: Extensive scalp hematoma posteriorly on the right. No underlying calvarial fracture. Sinuses/Orbits: Negative Critical Value/emergent results were called by telephone at the time of interpretation on 10/12/2018 at 6:41 pm to Dr. Cathren LaineKEVIN STEINL , who verbally acknowledged these results. CT CERVICAL SPINE FINDINGS Alignment: No traumatic malalignment Skull base and vertebrae: Negative for fracture Soft tissues and spinal canal: No prevertebral fluid or swelling. No visible canal hematoma. Thyroid nodules measuring up to 3 cm on the right. Disc levels: Prominent C5-6 and C6-7 disc degeneration with disc protrusions and buttressing osteophyte impinging on the ventral cord. Upper chest: Negative IMPRESSION: 1. Trace falcine subdural hemorrhage that is convincing based on a 2018 comparison. 2. Posterior scalp contusion without calvarial fracture. 3. Negative for cervical spine  fracture. Electronically Signed   By: Marnee SpringJonathon  Watts M.D.   On: 10/12/2018 18:44   Ct Cervical Spine Wo Contrast  Result Date: 10/12/2018 CLINICAL DATA:  Minor head trauma.  Syncope. EXAM: CT HEAD WITHOUT CONTRAST CT CERVICAL SPINE WITHOUT CONTRAST TECHNIQUE: Multidetector CT imaging of the head and cervical spine was performed following the standard protocol without intravenous contrast. Multiplanar CT image reconstructions of the cervical spine were also generated. COMPARISON:  Head CT 04/15/2017 FINDINGS: CT HEAD FINDINGS Brain: Minimal high-density thickening of the falx, see 4:29, measuring 3 mm in thickness. This is only convincing given comparison with 04/15/2017 head CT. Mild cerebral volume loss and chronic small vessel ischemic change in the white matter. No acute infarct, hydrocephalus, or or masslike finding Vascular: Atherosclerotic calcification Skull: Extensive scalp hematoma posteriorly on the right. No underlying calvarial fracture. Sinuses/Orbits: Negative Critical Value/emergent results were called by telephone at the time of interpretation on 10/12/2018 at 6:41 pm to Dr. Cathren LaineKEVIN STEINL , who verbally acknowledged these results. CT CERVICAL SPINE FINDINGS Alignment: No traumatic malalignment Skull base and vertebrae: Negative for fracture Soft tissues and spinal canal: No prevertebral fluid or swelling. No visible canal hematoma. Thyroid nodules measuring up to 3 cm on the right. Disc levels: Prominent C5-6 and C6-7 disc degeneration with disc protrusions and buttressing osteophyte impinging on the ventral cord. Upper chest: Negative IMPRESSION: 1. Trace falcine subdural hemorrhage that is convincing based on a 2018 comparison. 2. Posterior scalp contusion without calvarial fracture. 3. Negative for cervical spine fracture. Electronically Signed   By: Marnee SpringJonathon  Watts M.D.   On: 10/12/2018 18:44   Dg Chest Portable 1 View  Result Date: 10/12/2018 CLINICAL DATA:  Syncope with fall today.   Atrial fibrillation. EXAM: PORTABLE CHEST 1 VIEW COMPARISON:  09/02/2005 FINDINGS: Heart size is within normal limits considering the AP portable technique. Pulmonary vascularity is normal. Lungs are clear. No significant bone abnormality. IMPRESSION: No active disease. Electronically Signed   By: Francene BoyersJames  Maxwell M.D.   On: 10/12/2018 18:10   Scheduled Meds: . allopurinol  300 mg Oral Daily  . insulin aspart  0-9 Units Subcutaneous TID WC  . metoprolol tartrate  12.5 mg Oral BID  . simvastatin  20 mg Oral QHS  . sodium chloride flush  3 mL Intravenous Q12H   Continuous Infusions:   LOS: 0 days   Merlene Laughtermair Latif Sheikh, DO [facility] PAGER is on AMION  If 7PM-7AM, please contact night-coverage [email or web address] Password [email or web address] 10/13/2018, 5:08 PM

## 2018-10-13 NOTE — Consult Note (Addendum)
Cardiology Consultation:   Patient ID: Sherri Pratt MRN: [id number]; DOB: 1946-03-03  Admit date: 10/12/2018 Date of Consult: 10/13/2018  Primary Care Provider: [name], NP Primary Cardiologist: Thurmon Fair, MD  Primary Electrophysiologist:  None    Patient Profile:   Sherri Pratt is a 73 y.o. female with a hx of paroxysmal atrial fibrillation who is being seen today for the evaluation of syncope at the request of Dr. Marland Mcalpine.  History of Present Illness:   Sherri Pratt is a 73 year old woman with a past medical history significant for paroxysmal atrial fibrillation noted initially during hospitalization in 2018 for acute renal insufficiency due to hypovolemia.  She was noted to be asymptomatic with respect to atrial fibrillation on a 30-day event monitor.  She is anticoagulated with Eliquis and is on low-dose Lopressor. She is also a type II diabetic on metformin and glipizide.  She presented to the ED on 1231 after sustaining a syncopal episode at home.  She does not recall any antecedent symptoms.  EMS notes mention a wide-complex tachycardia on arrival with a heart rate in the 180-200 bpm range which spontaneously converted to atrial fibrillation.  She denies any antecedent chest pain, palpitations, fevers, chills, abdominal pain, lightheadedness/dizziness, and shortness of breath.  Her husband and 2 sons are in the room.  They provide a lot of additional history.  It appears the fall was not actually witnessed by her husband.  The patient denies loss of consciousness.  When EMS arrived, her blood sugar was apparently in the 20s and only after glucose administration in the form of juice and peanut butter did increase to the 40 range.  1 of her sons arrive before EMS and noted the patient to be confused.  Another son mentions that the patient has been having bilateral hand tremors for the past 1 month and frequent falls over the past 3 months.  She is currently being evaluated by  neurology in the outpatient setting.  Event monitoring in July 2018 demonstrated several episodes of rapid atrial fibrillation with evidence of aberrant conduction with wide-complex beats.  There were episodes of a regular wide-complex tachycardia which were felt most likely to represent atrial flutter with 2-1 AV block or paroxysmal atrial tachycardia with one-to-one AV conduction and aberrancy.  There were frequent isolated PACs and PVCs with occasional ventricular couplets.  Echocardiogram on 04/16/2017 demonstrated normal left ventricular systolic and diastolic function and normal regional wall motion, LVEF 55 to 60%, and mild left atrial dilatation.  Notable labs during this admission: Normal CBC, potassium low at 3.3 initially, creatinine mildly elevated at 1.11, magnesium mildly low at 1.6 on initial check, normal troponins.  Chest x-ray showed no active disease.  Head CT showed trace falcine subdural hemorrhage.  I personally reviewed the ECG performed yesterday at 1735 which demonstrated atrial fibrillation and nonspecific T wave abnormalities.     Past Medical History:  Diagnosis Date  . Anemia   . Chronic kidney disease   . Diabetes mellitus   . Gout   . Hyperlipidemia   . Hypertension     Past Surgical History:  Procedure Laterality Date  . APPENDECTOMY         Inpatient Medications: Scheduled Meds: . allopurinol  300 mg Oral Daily  . insulin aspart  0-9 Units Subcutaneous TID WC  . metoprolol tartrate  12.5 mg Oral BID  . simvastatin  20 mg Oral QHS  . sodium chloride flush  3 mL Intravenous Q12H   Continuous Infusions:  PRN Meds: acetaminophen **OR** acetaminophen, albuterol  Allergies:    Allergies  Allergen Reactions  . Peanut-Containing Drug Products     Social History:   Social History   Socioeconomic History  . Marital status: Married    Spouse name: Not on file  . Number of children: Not on file  . Years of education: Not on file  .  Highest education level: Not on file  Occupational History  . Not on file  Social Needs  . Financial resource strain: Not on file  . Food insecurity:    Worry: Not on file    Inability: Not on file  . Transportation needs:    Medical: Not on file    Non-medical: Not on file  Tobacco Use  . Smoking status: Never Smoker  . Smokeless tobacco: Never Used  Substance and Sexual Activity  . Alcohol use: No  . Drug use: No  . Sexual activity: Not on file  Lifestyle  . Physical activity:    Days per week: Not on file    Minutes per session: Not on file  . Stress: Not on file  Relationships  . Social connections:    Talks on phone: Not on file    Gets together: Not on file    Attends religious service: Not on file    Active member of club or organization: Not on file    Attends meetings of clubs or organizations: Not on file    Relationship status: Not on file  . Intimate partner violence:    Fear of current or ex partner: Not on file    Emotionally abused: Not on file    Physically abused: Not on file    Forced sexual activity: Not on file  Other Topics Concern  . 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     Family History:    Family History  Problem Relation Age of Onset  . Cancer Mother        colon  . Hypertension Mother   . Cancer Father        colon     ROS:  Please see the history of present illness.   All other ROS reviewed and negative.     Physical Exam/Data:   Vitals:   10/13/18 0006 10/13/18 0427 10/13/18 0820 10/13/18 1318  BP: (!) 132/53 (!) 136/59 (!) 152/61 (!) 121/51  Pulse: 63 60 61 61  Resp: 19 18 15 20   Temp: 98.8 F (37.1 C) 98.9 F (37.2 C) 98.3 F (36.8 C) (!) 97.4 F (36.3 C)  TempSrc: Oral Oral Oral Oral  SpO2: 96% 97% 95% 96%  Weight:      Height:        Intake/Output Summary (Last 24 hours) at 10/13/2018  1359 Last data filed at 10/13/2018 0500 Gross per 24 hour  Intake 690 ml  Output -  Net 690 ml   Filed Weights   10/12/18 2205  Weight: 87.3 kg   Body mass index is 30.14 kg/m.  General:  Well nourished, well developed, in no acute distress HEENT: normal Lymph: no adenopathy Neck: no JVD Endocrine:  No thryomegaly Cardiac:  normal S1, S2; RRR; no murmur  Lungs:  clear to auscultation bilaterally, no wheezing, rhonchi or rales  Abd: soft, nontender, no hepatomegaly  Ext: no edema Musculoskeletal:  No deformities, BUE and BLE strength normal and equal Skin: warm and dry  Neuro:  CNs 2-12 intact, no focal abnormalities noted Psych:  Normal affect   EKG:  The EKG was personally reviewed and demonstrates: Atrial fibrillation with nonspecific T wave abnormalities Telemetry:  Telemetry was personally reviewed and demonstrates: Currently in sinus rhythm  Relevant CV Studies:  Event monitoring in July 2018 demonstrated several episodes of rapid atrial fibrillation with evidence of aberrant conduction with wide-complex beats.  There were episodes of a regular wide-complex tachycardia which were felt most likely to represent atrial flutter with 2-1 AV block or paroxysmal atrial tachycardia with one-to-one AV conduction and aberrancy.  There were frequent isolated PACs and PVCs with occasional ventricular couplets.  Echocardiogram on 04/16/2017 demonstrated normal left ventricular systolic and diastolic function and normal regional wall motion, LVEF 55 to 60%, and mild left atrial dilatation.  Laboratory Data:  Chemistry Recent Labs  Lab 10/08/18 1123 10/12/18 1742 10/13/18 0432  NA 144 141 140  K 3.7 3.3* 3.6  CL 107 109 107  CO2 25 21* 22  GLUCOSE 105* 74 217*  BUN 15 10 11   CREATININE 1.16 1.11* 1.09*  CALCIUM 9.3 9.0 8.7*  GFRNONAA  --  49* 50*  GFRAA  --  57* 58*  ANIONGAP  --  11 11    No results for input(s): PROT, ALBUMIN, AST, ALT, ALKPHOS, BILITOT in the last 168  hours. Hematology Recent Labs  Lab 10/08/18 1123 10/12/18 1742 10/13/18 0432  WBC 10.2 9.0 5.8  RBC 4.72 4.64 4.14  HGB 12.6 12.2 11.0*  HCT 38.8 39.4 34.7*  MCV 82.4 84.9 83.8  MCH  --  26.3 26.6  MCHC 32.5 31.0 31.7  RDW 16.1* 14.7 14.8  PLT 225.0 208 204   Cardiac Enzymes Recent Labs  Lab 10/12/18 2304 10/13/18 0432 10/13/18 0928  TROPONINI <0.03 <0.03 <0.03   No results for input(s): TROPIPOC in the last 168 hours.  BNPNo results for input(s): BNP, PROBNP in the last 168 hours.  DDimer No results for input(s): DDIMER in the last 168 hours.  Radiology/Studies:  Ct Head Wo Contrast  Result Date: 10/12/2018 CLINICAL DATA:  Minor head trauma.  Syncope. EXAM: CT HEAD WITHOUT CONTRAST CT CERVICAL SPINE WITHOUT CONTRAST TECHNIQUE: Multidetector CT imaging of the head and cervical spine was performed following the standard protocol without intravenous contrast. Multiplanar CT image reconstructions of the cervical spine were also generated. COMPARISON:  Head CT 04/15/2017 FINDINGS: CT HEAD FINDINGS Brain: Minimal high-density thickening of the falx, see 4:29, measuring 3 mm in thickness. This is only convincing given comparison with 04/15/2017 head CT. Mild cerebral volume loss and chronic small vessel ischemic change in the white matter. No acute infarct, hydrocephalus, or or masslike finding Vascular: Atherosclerotic calcification Skull: Extensive scalp hematoma posteriorly on the right. No underlying calvarial fracture. Sinuses/Orbits: Negative Critical Value/emergent results were called by telephone at the time of interpretation on 10/12/2018 at 6:41 pm to Dr. Cathren Laine , who verbally acknowledged these results. CT CERVICAL SPINE FINDINGS Alignment: No traumatic malalignment Skull base and vertebrae: Negative for fracture Soft tissues and spinal canal: No prevertebral fluid or swelling. No visible canal hematoma. Thyroid nodules measuring up to 3 cm on the right. Disc levels:  Prominent C5-6 and C6-7 disc degeneration with disc protrusions and buttressing osteophyte impinging on the ventral cord. Upper chest: Negative IMPRESSION: 1. Trace falcine subdural hemorrhage that is convincing based on a 2018 comparison. 2.  Posterior scalp contusion without calvarial fracture. 3. Negative for cervical spine fracture. Electronically Signed   By: Marnee Spring M.D.   On: 10/12/2018 18:44   Ct Cervical Spine Wo Contrast  Result Date: 10/12/2018 CLINICAL DATA:  Minor head trauma.  Syncope. EXAM: CT HEAD WITHOUT CONTRAST CT CERVICAL SPINE WITHOUT CONTRAST TECHNIQUE: Multidetector CT imaging of the head and cervical spine was performed following the standard protocol without intravenous contrast. Multiplanar CT image reconstructions of the cervical spine were also generated. COMPARISON:  Head CT 04/15/2017 FINDINGS: CT HEAD FINDINGS Brain: Minimal high-density thickening of the falx, see 4:29, measuring 3 mm in thickness. This is only convincing given comparison with 04/15/2017 head CT. Mild cerebral volume loss and chronic small vessel ischemic change in the white matter. No acute infarct, hydrocephalus, or or masslike finding Vascular: Atherosclerotic calcification Skull: Extensive scalp hematoma posteriorly on the right. No underlying calvarial fracture. Sinuses/Orbits: Negative Critical Value/emergent results were called by telephone at the time of interpretation on 10/12/2018 at 6:41 pm to Dr. Cathren Laine , who verbally acknowledged these results. CT CERVICAL SPINE FINDINGS Alignment: No traumatic malalignment Skull base and vertebrae: Negative for fracture Soft tissues and spinal canal: No prevertebral fluid or swelling. No visible canal hematoma. Thyroid nodules measuring up to 3 cm on the right. Disc levels: Prominent C5-6 and C6-7 disc degeneration with disc protrusions and buttressing osteophyte impinging on the ventral cord. Upper chest: Negative IMPRESSION: 1. Trace falcine subdural  hemorrhage that is convincing based on a 2018 comparison. 2. Posterior scalp contusion without calvarial fracture. 3. Negative for cervical spine fracture. Electronically Signed   By: Marnee Spring M.D.   On: 10/12/2018 18:44   Dg Chest Portable 1 View  Result Date: 10/12/2018 CLINICAL DATA:  Syncope with fall today.  Atrial fibrillation. EXAM: PORTABLE CHEST 1 VIEW COMPARISON:  09/02/2005 FINDINGS: Heart size is within normal limits considering the AP portable technique. Pulmonary vascularity is normal. Lungs are clear. No significant bone abnormality. IMPRESSION: No active disease. Electronically Signed   By: Francene Boyers M.D.   On: 10/12/2018 18:10    Assessment and Plan:   1.  Syncope/near syncope: It is not entirely clear that she actually lost consciousness.  Given the fact that she was markedly hypoglycemic when initially evaluated by EMS, I suspect this is what caused her symptoms.  She was noted to have a wide-complex tachycardia by EMS but this may have been driven by severe hypoglycemia. With event monitoring in 2018 (reviewed above), wide-complex tachycardia was felt to represent either atrial flutter with 2:1 conduction or atrial tachycardia with one-to-one conduction and aberrancy.   Due to bradycardia with Lopressor 25 mg bid, she has been maintained on low-dose Lopressor 12.5 mg twice daily.   She is currently in sinus rhythm with a heart rate in the 60 bpm range.  I would continue this dose of Lopressor for now. There is nothing to suggest ischemic heart disease as a potential etiology. She can be reevaluated in the outpatient setting to determine if event monitoring is warranted, but I do not feel this is indicated at present.  2.  Paroxysmal atrial fibrillation: Currently in sinus rhythm.  Due to subdural hemorrhage, Eliquis is on hold and it has been recommended by neurosurgery to resume in 2 weeks.  Given her 81-month history of frequent falls, it may be prudent to withhold  anticoagulation altogether.  3.  Subdural hemorrhage: Evaluated by neurosurgery.  They recommended resuming Eliquis in 2 weeks.  Given her  frequent falls, it may be prudent to withhold systemic anticoagulation altogether.  4.  Bilateral hand tremors with frequent falls: Currently being evaluated by neurology in the outpatient setting.   For questions or updates, please contact [facility] Please consult [email or web address] for contact info under     Signed, Prentice Docker, MD  10/13/2018 1:59 PM

## 2018-10-13 NOTE — Consult Note (Signed)
Neurosurgery Consultation  Reason for Consult: Subdural hematoma Referring Physician: Katrinka Blazing  CC: Syncope  HPI: This is a 73 y.o. woman that presents after a syncopal event where she struck her head. No headache but given mechanism and anticoagulation, CTH was obtained which showed a parafalcine SDH. She currently denies any new weakness, numbness, or parasthesias. She was on apixiban for PAF prior to admission, which has been held.   ROS: A 14 point ROS was performed and is negative except as noted in the HPI but limited because patient does not recall the event.   PMHx:  Past Medical History:  Diagnosis Date  . Anemia   . Chronic kidney disease   . Diabetes mellitus   . Gout   . Hyperlipidemia   . Hypertension    FamHx:  Family History  Problem Relation Age of Onset  . Cancer Mother        colon  . Hypertension Mother   . Cancer Father        colon   SocHx:  reports that she has never smoked. She has never used smokeless tobacco. She reports that she does not drink alcohol or use drugs.  Exam: Vital signs in last 24 hours: Temp:  [97.5 F (36.4 C)-98.8 F (37.1 C)] 98.8 F (37.1 C) (01/01 0006) Pulse Rate:  [62-71] 63 (01/01 0006) Resp:  [12-22] 19 (01/01 0006) BP: (125-159)/(53-137) 132/53 (01/01 0006) SpO2:  [96 %-100 %] 96 % (01/01 0006) Weight:  [87.3 kg] 87.3 kg (12/31 2205) General: Awake, alert, cooperative, lying in bed in NAD Head: normocephalic, +scalp contusion HEENT: neck supple Pulmonary: breathing room air comfortably, no evidence of increased work of breathing Cardiac: RRR  Abdomen: S NT ND Extremities: warm and well perfused x4 Neuro: AOx3, PERRL, EOMI, FS Strength 5/5 x4, SILTx4, no drift   Assessment and Plan: 73 y.o. woman s/p syncopal fall while on eliquis. CTH personally reviewed, which shows a very small parafacline acute subdural hematoma. -no acute neurosurgical intervention indicated at this time -restart eliquis in 2 weeks -no repeat  imaging needed at this time if patient continues to clinically do well -please call with any concerns or questions. No scheduled follow up needed, pt can follow up with me prn if she has any new concerns or questions [phone].  Jadene Pierini, MD 10/13/18 1:51 AM [facility]

## 2018-10-14 ENCOUNTER — Encounter: Payer: Self-pay | Admitting: Neurology

## 2018-10-14 ENCOUNTER — Encounter: Payer: Self-pay | Admitting: Family Medicine

## 2018-10-14 ENCOUNTER — Ambulatory Visit: Payer: Medicare Other | Admitting: Neurology

## 2018-10-14 DIAGNOSIS — R55 Syncope and collapse: Secondary | ICD-10-CM | POA: Diagnosis not present

## 2018-10-14 DIAGNOSIS — Z029 Encounter for administrative examinations, unspecified: Secondary | ICD-10-CM

## 2018-10-14 DIAGNOSIS — E785 Hyperlipidemia, unspecified: Secondary | ICD-10-CM | POA: Diagnosis not present

## 2018-10-14 DIAGNOSIS — N182 Chronic kidney disease, stage 2 (mild): Secondary | ICD-10-CM | POA: Diagnosis not present

## 2018-10-14 DIAGNOSIS — I10 Essential (primary) hypertension: Secondary | ICD-10-CM | POA: Diagnosis not present

## 2018-10-14 LAB — CBC WITH DIFFERENTIAL/PLATELET
Abs Immature Granulocytes: 0.03 10*3/uL (ref 0.00–0.07)
Basophils Absolute: 0 10*3/uL (ref 0.0–0.1)
Basophils Relative: 1 %
Eosinophils Absolute: 0.1 10*3/uL (ref 0.0–0.5)
Eosinophils Relative: 3 %
HCT: 35.9 % — ABNORMAL LOW (ref 36.0–46.0)
Hemoglobin: 11.5 g/dL — ABNORMAL LOW (ref 12.0–15.0)
Immature Granulocytes: 1 %
LYMPHS PCT: 27 %
Lymphs Abs: 1.5 10*3/uL (ref 0.7–4.0)
MCH: 26.7 pg (ref 26.0–34.0)
MCHC: 32 g/dL (ref 30.0–36.0)
MCV: 83.5 fL (ref 80.0–100.0)
Monocytes Absolute: 0.5 10*3/uL (ref 0.1–1.0)
Monocytes Relative: 9 %
Neutro Abs: 3.3 10*3/uL (ref 1.7–7.7)
Neutrophils Relative %: 59 %
Platelets: 189 10*3/uL (ref 150–400)
RBC: 4.3 MIL/uL (ref 3.87–5.11)
RDW: 14.6 % (ref 11.5–15.5)
WBC: 5.5 10*3/uL (ref 4.0–10.5)
nRBC: 0 % (ref 0.0–0.2)

## 2018-10-14 LAB — COMPREHENSIVE METABOLIC PANEL
ALT: 17 U/L (ref 0–44)
ANION GAP: 7 (ref 5–15)
AST: 23 U/L (ref 15–41)
Albumin: 3 g/dL — ABNORMAL LOW (ref 3.5–5.0)
Alkaline Phosphatase: 55 U/L (ref 38–126)
BUN: 11 mg/dL (ref 8–23)
CO2: 25 mmol/L (ref 22–32)
Calcium: 8.7 mg/dL — ABNORMAL LOW (ref 8.9–10.3)
Chloride: 108 mmol/L (ref 98–111)
Creatinine, Ser: 1.18 mg/dL — ABNORMAL HIGH (ref 0.44–1.00)
GFR calc non Af Amer: 46 mL/min — ABNORMAL LOW (ref >60)
GFR, EST AFRICAN AMERICAN: 53 mL/min — AB (ref >60)
Glucose, Bld: 143 mg/dL — ABNORMAL HIGH (ref 70–99)
Potassium: 3.5 mmol/L (ref 3.5–5.1)
SODIUM: 140 mmol/L (ref 135–145)
Total Bilirubin: 0.8 mg/dL (ref 0.3–1.2)
Total Protein: 5.7 g/dL — ABNORMAL LOW (ref 6.5–8.1)

## 2018-10-14 LAB — GLUCOSE, CAPILLARY
Glucose-Capillary: 177 mg/dL — ABNORMAL HIGH (ref 70–99)
Glucose-Capillary: 227 mg/dL — ABNORMAL HIGH (ref 70–99)
Glucose-Capillary: 105 mg/dL — ABNORMAL HIGH (ref 70–99)
Glucose-Capillary: 123 mg/dL — ABNORMAL HIGH (ref 70–99)

## 2018-10-14 LAB — MAGNESIUM: Magnesium: 1.9 mg/dL (ref 1.7–2.4)

## 2018-10-14 LAB — PHOSPHORUS: PHOSPHORUS: 2.6 mg/dL (ref 2.5–4.6)

## 2018-10-14 NOTE — NC FL2 (Signed)
[place] MEDICAID FL2 LEVEL OF CARE SCREENING TOOL     IDENTIFICATION  Patient Name: Sherri Pratt Birthdate: Jan 21, 1946 Sex: female Admission Date (Current Location): 10/12/2018  [facility] and [place] Number:  Producer, television/film/video and Address:  [facility] [place] [facility], [address], [place], [place] [address]      Provider Number: [id number]  Attending Physician Name and Address:  Merlene Laughter, DO  Relative Name and Phone Number:       Current Level of Care: Hospital Recommended Level of Care: Skilled Nursing Facility Prior Approval Number:    Date Approved/Denied:   PASRR Number: [id number]  Discharge Plan: SNF    Current Diagnoses: Patient Active Problem List   Diagnosis Date Noted  . Prolonged QT interval 10/13/2018  . Hypokalemia 10/13/2018  . SDH (subdural hematoma) (HCC) 10/13/2018  . Syncope and collapse 10/12/2018  . Type 2 diabetes mellitus with hypoglycemia without coma (HCC) 10/12/2018  . Paroxysmal atrial fibrillation (HCC) 04/17/2017  . Hypophosphatemia 04/17/2017  . Hypomagnesemia 04/17/2017  . Acute lower UTI 04/17/2017  . Hyperthyroidism 04/17/2017  . Anemia 11/08/2012  . GOUT 11/12/2010  . Chronic kidney disease 03/27/2009  . Diabetes 1.5, managed as type 1 (HCC) 09/21/2007  . Hyperlipidemia 09/21/2007  . Essential hypertension 09/21/2007    Orientation RESPIRATION BLADDER Height & Weight     Self, Time, Situation, Place  Normal Incontinent, External catheter Weight: 192 lb 7.4 oz (87.3 kg) Height:  5\' 7"  (170.2 cm)  BEHAVIORAL SYMPTOMS/MOOD NEUROLOGICAL BOWEL NUTRITION STATUS      Continent Diet(see DC summary)  AMBULATORY STATUS COMMUNICATION OF NEEDS Skin   Limited Assist Verbally Normal                       Personal Care Assistance Level of Assistance  Bathing, Feeding, Dressing Bathing Assistance: Limited assistance Feeding assistance: Independent Dressing Assistance: Limited assistance      Functional Limitations Info  Sight, Hearing, Speech Sight Info: Adequate Hearing Info: Adequate Speech Info: Adequate    SPECIAL CARE FACTORS FREQUENCY  PT (By licensed PT), OT (By licensed OT)     PT Frequency: 5x/wk OT Frequency: 5x/wk            Contractures Contractures Info: Not present    Additional Factors Info  Code Status, Allergies, Insulin Sliding Scale Code Status Info: Full Allergies Info: Peanut-containing Drug products   Insulin Sliding Scale Info: 0-9 units 3x/day with meals       Current Medications (10/14/2018):  This is the current hospital active medication list Current Facility-Administered Medications  Medication Dose Route Frequency Provider Last Rate Last Dose  . acetaminophen (TYLENOL) tablet 650 mg  650 mg Oral Q6H PRN Madelyn Flavors A, MD   650 mg at 10/13/18 4166   Or  . acetaminophen (TYLENOL) suppository 650 mg  650 mg Rectal Q6H PRN Madelyn Flavors A, MD      . albuterol (PROVENTIL) (2.5 MG/3ML) 0.083% nebulizer solution 2.5 mg  2.5 mg Nebulization Q6H PRN Smith, Rondell A, MD      . allopurinol (ZYLOPRIM) tablet 300 mg  300 mg Oral Daily Katrinka Blazing, Rondell A, MD   300 mg at 10/14/18 0840  . insulin aspart (novoLOG) injection 0-9 Units  0-9 Units Subcutaneous TID WC Madelyn Flavors A, MD   3 Units at 10/14/18 1312  . metoprolol tartrate (LOPRESSOR) tablet 12.5 mg  12.5 mg Oral BID Madelyn Flavors A, MD   12.5 mg at 10/14/18  0840  . simvastatin (ZOCOR) tablet 20 mg  20 mg Oral QHS Smith, Rondell A, MD   20 mg at 10/13/18 2043  . sodium chloride flush (NS) 0.9 % injection 3 mL  3 mL Intravenous Q12H Madelyn Flavors A, MD   3 mL at 10/14/18 9150     Discharge Medications: Please see discharge summary for a list of discharge medications.  Relevant Imaging Results:  Relevant Lab Results:   Additional Information SS#; [id number]  Baldemar Lenis, LCSW

## 2018-10-14 NOTE — Evaluation (Signed)
Physical Therapy Evaluation Patient Details Name: Sherri Pratt MRN: [id number] DOB: December 22, 1945 Today's Date: 10/14/2018   History of Present Illness  73yo female s/p fall, found to be tachycardic in EMS with rate 180-200BPM, spontaneously converted to A-fib in controlled pace, blood glucose 54. Cervical spine clear, found to have minor subdural hemorrhage. PMH CKD, DM, gout, HTN, PAF   Clinical Impression   Patient received in bed, pleasant and willing to participate in session; spouse present for session, and son entered room mid-session. Note spouse has dementia and is not a reliable historian or decision maker. Patient requires MaxA for rolling and sidelying to sit, initially required MinA to maintain balance at EOB due to posterior lean but this faded to close S. MaxA and multiple attempts/elevated surface required to come to standing, and posterior lean noted in standing. At one point patient stood with min guard by impulsively pulling up on RW, however this was not safe or consistent. Able to ambulate approximately 20f in room with min guard and RW, but easily fatigued. Able to return to bed with [place] and was left in bed with family present, bed alarm active, and all other needs met this afternoon. She will continue to benefit from skilled PT services in the acute setting as well as skilled rehabilitation in the ST-SNF setting moving forward. Do not feel that she could safely return home at this point due to poor safety awareness, spouse with dementia, and multiple recent falls.     Follow Up Recommendations SNF    Equipment Recommendations  Other (comment)(defer to next venue )    Recommendations for Other Services       Precautions / Restrictions Precautions Precautions: Fall Restrictions Weight Bearing Restrictions: No      Mobility  Bed Mobility Overal bed mobility: Needs Assistance Bed Mobility: Rolling;Sidelying to Sit;Sit to Sidelying Rolling: Max assist Sidelying to  sit: Max assist     Sit to sidelying: Mod assist General bed mobility comments: heavy assist to come to EOB, initially MinA to maintain balance due to posterior lean fading to close S   Transfers Overall transfer level: Needs assistance Equipment used: Rolling walker (2 wheeled) Transfers: Sit to/from Stand Sit to Stand: Max assist;From elevated surface         General transfer comment: MaxA to come to standing position at EOB on multiple attempts; on one attempt patient stood up with min guard pulling up on RW but this is not consistent or safe   Ambulation/Gait Ambulation/Gait assistance: Min guard Gait Distance (Feet): 20 Feet Assistive device: Rolling walker (2 wheeled) Gait Pattern/deviations: Step-through pattern;Decreased step length - right;Decreased step length - left;Decreased dorsiflexion - right;Decreased dorsiflexion - left;Trunk flexed;Drifts right/left Gait velocity: decreased    General Gait Details: reduced gait speed, unsafe distance from RW, easily fatigued   Stairs            Wheelchair Mobility    Modified Rankin (Stroke Patients Only)       Balance Overall balance assessment: Needs assistance Sitting-balance support: Bilateral upper extremity supported;Feet supported Sitting balance-Leahy Scale: Poor Sitting balance - Comments: initially MinA to maintain balance fading to close S  Postural control: Posterior lean Standing balance support: Bilateral upper extremity supported;During functional activity Standing balance-Leahy Scale: Poor Standing balance comment: heavy reliance on B UE support, posterior lean in static standing  Pertinent Vitals/Pain Pain Assessment: No/denies pain    Home Living Family/patient expects to be discharged to:: Private residence Living Arrangements: Spouse/significant other Available Help at Discharge: Family;Available 24 hours/day Type of Home: House Home Access: Stairs  to enter Entrance Stairs-Rails: None Entrance Stairs-Number of Steps: 3 no railings in front, B railings in back  Home Layout: One level Home Equipment: Walker - 2 wheels;Toilet riser Additional Comments: spouse has dementia, unreliable historian and for decision making     Prior Function Level of Independence: Needs assistance   Gait / Transfers Assistance Needed: frequent falls recently, increased need for assist recently            Hand Dominance        Extremity/Trunk Assessment   Upper Extremity Assessment Upper Extremity Assessment: Defer to OT evaluation    Lower Extremity Assessment Lower Extremity Assessment: Generalized weakness    Cervical / Trunk Assessment Cervical / Trunk Assessment: Kyphotic  Communication   Communication: No difficulties  Cognition Arousal/Alertness: Awake/alert Behavior During Therapy: Flat affect Overall Cognitive Status: Within Functional Limits for tasks assessed                                        General Comments      Exercises     Assessment/Plan    PT Assessment Patient needs continued PT services  PT Problem List Decreased strength;Decreased balance;Decreased cognition;Decreased mobility;Decreased knowledge of use of DME;Obesity;Decreased activity tolerance;Decreased coordination;Decreased safety awareness       PT Treatment Interventions DME instruction;Functional mobility training;Balance training;Patient/family education;Gait training;Therapeutic activities;Neuromuscular re-education;Stair training;Therapeutic exercise;Manual techniques;Wheelchair mobility training    PT Goals (Current goals can be found in the Care Plan section)  Acute Rehab PT Goals Patient Stated Goal: to go rehab and get stronger/safer  PT Goal Formulation: With patient/family Time For Goal Achievement: 10/28/18 Potential to Achieve Goals: Good    Frequency Min 2X/week   Barriers to discharge        Co-evaluation                AM-PAC PT "6 Clicks" Mobility  Outcome Measure Help needed turning from your back to your side while in a flat bed without using bedrails?: A Lot Help needed moving from lying on your back to sitting on the side of a flat bed without using bedrails?: A Lot Help needed moving to and from a bed to a chair (including a wheelchair)?: A Lot Help needed standing up from a chair using your arms (e.g., wheelchair or bedside chair)?: A Lot Help needed to walk in hospital room?: A Little Help needed climbing 3-5 steps with a railing? : Total 6 Click Score: 12    End of Session Equipment Utilized During Treatment: Gait belt Activity Tolerance: Patient tolerated treatment well Patient left: in bed;with bed alarm set;with family/visitor present;with call bell/phone within reach Nurse Communication: Mobility status PT Visit Diagnosis: Unsteadiness on feet (R26.81);Muscle weakness (generalized) (M62.81);Difficulty in walking, not elsewhere classified (R26.2);Repeated falls (R29.6)    Time: 1415-1440 PT Time Calculation (min) (ACUTE ONLY): 25 min   Charges:   PT Evaluation $PT Eval Moderate Complexity: 1 Mod PT Treatments $Gait Training: 8-22 mins        Deniece Ree PT, DPT, CBIS  Supplemental Physical Therapist [place]    Pager [phone] Acute Rehab Office [phone]

## 2018-10-14 NOTE — Discharge Summary (Signed)
Physician Discharge Summary  Sherri Pratt ZOX:[id number] DOB: 05-29-1946 DOA: 10/12/2018  PCP: Shirline FreesNafziger, Cory, NP  Admit date: 10/12/2018 Discharge date: 10/15/2018  Admitted From: Home Disposition: SNF  Recommendations for Outpatient Follow-up:  1. Follow up with PCP in 1-2 weeks 2. Follow up with Cardiology 3. Follow up with Neurology in the outpatient setting  4. Follow up with NeuroSurgery 5. Please obtain CMP/CBC, Mag, Phos in one week 6. Please follow up on the following pending results:  Home Health: No Equipment/Devices: None and have deferred by PT to next venue    Discharge Condition: Stable CODE STATUS: FULL CODE  Diet recommendation:   Brief/Interim Summary: HPI per Dr. Madelyn Flavorsondell Smith on 10/12/18 Sherri Pratt a 73 y.o.femalewith medical history significant ofPAFon Eliquis, HTN, HLD, DM, gout, and CKD stage III;who presents after having a fall. Patient lives at home with her husband and ambulates with use of a walker. Patient calls that her husband called out to her and the next thing she recalls is lying on the floor. Her husband witnessed the fall, but has dementia and unable to provide history. The patient's son found her lying on the floor disoriented not acting like normal. She normally does not feel lightheaded or dizzy when changing positions. Her sonsreport that she has been having episodesoflow blood sugars.He reports that she does not check her blood sugar regularly and does not eat regularly. She is on glipizide and metformin for treatment of her diabetes. Currently patient denies any recent fever, history of seizures, chest pain, palpitations, nausea, vomiting, diarrhea, dysuria,or Parkinson's dz. patient has also had multiple falls with the last occurring sometime last month. Lastly, family makes note that over the last few weeks she haddeveloped a constant tremor of bilateral upper extremities that is present even at rest.  En routewith  EMS patient was noted to be in a wide-complex tachycardia with heart rates 180-200 that spontaneously converted to atrial fibrillation with controlled rate. Initial blood sugar was 54 and improved with fluids and snack to 102.  ED Course:Upon admission into the emergency department patient was noted to be afebrile vital signs relatively within normal limits. Labs revealed normal CBC, potassium 3.3, BUN 10, creatinine 1.11, and glucose 74.CT scansof the head and cervical spine revealed trace falcine subdural hemorrhage with posterior scalp contusion without fracture. [place] [facility] was consulted and will see the patient. Patient was given 40 mEq of potassium chloride and an amp of dextrose. [facility] called to admit  **NeuroSurgery evaluated and they recommend no acute neurosurgical intervention at this time and recommending holding Eliquis for least 2 weeks and they also recommend no repeat imaging at this time if patient continues to do well clinically.  Audiology was consulted for near syncope and feel that her hypoglycemia may have initiated this.  They recommended continue Lopressor and feel this is nothing to suggest ischemic heart disease as a potential etiology and recommending outpatient monitoring if warranted. She is stable to D/C to SNF and will need to follow up with PCP, Cardiology, and Neurology in the outpatient setting.   Discharge Diagnoses:  Principal Problem:   Syncope and collapse Active Problems:   Hyperlipidemia   Essential hypertension   Chronic kidney disease   Paroxysmal atrial fibrillation (HCC)   Type 2 diabetes mellitus with hypoglycemia without coma (HCC)   Prolonged QT interval   Hypokalemia   SDH (subdural hematoma) (HCC)  Syncope and Collapse with subdural hematoma -Acute.Patient presents after having a fall. Patient initially found to be  hypoglycemic and tachycardic with heart rates into the 180-200s. Suspect patient likely had syncopal event  causing onset of symptoms.Cardiology feels that was mediated by her severe hypoglycemia -Placed in Obs Telemetry -C/wNeurochecks -Trend troponinand were <0.03 x3 -Continue with telemetry for now -Appreciate neurosurgery consultative servicesand they recommend holding Eliquis at least 2 weeks and recommend no Neurosurgical Intervention; They also recommend no repeat Imaging if doing well clinically -Physical therapy evaluated and recommending SNF and patient is agreeable so we have consulted the social worker for assistance with placement  Arrhythmia,AtrialFibrillation onChronicAnticoagulation -Now in NSR in the 60's -Patient was noted to have heart rates into the 180-200s prior to arrival with EMS. -She is followed by Dr. CroitoruCardiology.Cardiology consulted for further evaluation and recommendations -Family makes note that the patient has had recent falls. -CHA2DS2-VASc score =4(age, sex, HTN, and DM) -Check and replace electrolyte abnormalities -Hold Eliquisdue to subdural hematomabut Cardiology recommending possibly holding altogether indefinitely but will decide in outpatient setting. -Continue metoprolol12.5 mg po BIDper Cardiology -Follow up with Cardiology Dr. [name] in 1-2 weeks   Prolonged QTInterval -Initial QTc calculated at 520. -Check magnesiumandreplace ifneeded -Recheck EKG as an outpatient -Avoid QT prolonging agents  DiabetesMellitusType II withHypoglycemia -Patient noted blood sugars as low as 54 requiring and amp ofdextrose. -Last hemoglobin A1c noted to be 7.5 on 8/22. -Hypoglycemic protocols -Check hemoglobin A1cin a.mand was 5.2 -Hold metformin and glipizide hospitalized and will discontinue at discharge -CBGs q. before meals and at bedtime with sensitive SSI with mealswhile hospitalized and Defer to SNF to continue -Will consult Diabetes Education Coordinator  -CBG's ranging from 105-290  Hypokalemia -Acute.  Initial potassium noted be 3.3on admission. -Patient was given 40 mEq of potassium chloride. -K+ is now 3.7 and Mg is 1.9 -Continue to monitor andRepleteas needed -Repeat CMP within 1 week   Tremor -Acute. -Symptoms present over the last fewweeks per family. -Referral to neurology has been placed in the outpatient setting.  ChronicKidneyDiseaseStage 3 -Stable. Patient creatinine noted to be 1.11 on admission which appears slightly improved from previous baseline.This AM BUN/Cr was 11/1.08 -Continue to monitorand Trend Renal Fxn -Repeat CMP in AM  History ofGout -ContinueAllopurinol300 mg po Daily  Hyperlipidemia -ContinueSimvastatin20 mg po qHS  Obesity -Estimated body mass index is 30.14 kg/m as calculated from the following:   Height as of this encounter: 5\' 7"  (1.702 m).   Weight as of this encounter: 87.3 kg. -Weight Loss Counseling given  Normocytic Anemia -Likely in the setting of chronic kidney disease -Patient's hemoglobin/hematocrit went from 11.0/34.7 is now 11.9/38.6 -Continue monitor for signs and symptoms of bleeding -Repeat CBC as an outpatient . and have outpatient anemia panel work-up done   Discharge Instructions  Discharge Instructions    Call MD for:  difficulty breathing, headache or visual disturbances   Complete by:  As directed    Call MD for:  extreme fatigue   Complete by:  As directed    Call MD for:  hives   Complete by:  As directed    Call MD for:  persistant dizziness or light-headedness   Complete by:  As directed    Call MD for:  persistant nausea and vomiting   Complete by:  As directed    Call MD for:  redness, tenderness, or signs of infection (pain, swelling, redness, odor or green/yellow discharge around incision site)   Complete by:  As directed    Call MD for:  severe uncontrolled pain   Complete by:  As directed  Call MD for:  temperature >100.4   Complete by:  As directed    Diet - low sodium  heart healthy   Complete by:  As directed    Discharge instructions   Complete by:  As directed    You were cared for by a hospitalist during your hospital stay. If you have any questions about your discharge medications or the care you received while you were in the hospital after you are discharged, you can call the unit and ask to speak with the hospitalist on call if the hospitalist that took care of you is not available. Once you are discharged, your primary care physician will handle any further medical issues. Please note that NO REFILLS for any discharge medications will be authorized once you are discharged, as it is imperative that you return to your primary care physician (or establish a relationship with a primary care physician if you do not have one) for your aftercare needs so that they can reassess your need for medications and monitor your lab values.  Follow up with PCP, Cardiology, Neurology, and NeuroSurgery in the outpatient setting. Take all medications as prescribed. If symptoms change or worsen please return to the ED for evaluation   Increase activity slowly   Complete by:  As directed      Allergies as of 10/15/2018      Reactions   Peanut-containing Drug Products       Medication List    STOP taking these medications   apixaban 5 MG Tabs tablet Commonly known as:  ELIQUIS   B-D SINGLE USE SWABS REGULAR Pads   fesoterodine 4 MG Tb24 tablet Commonly known as:  TOVIAZ   freestyle lancets   glipiZIDE 10 MG tablet Commonly known as:  GLUCOTROL   glucose blood test strip Commonly known as:  BAYER CONTOUR TEST   metFORMIN 500 MG tablet Commonly known as:  GLUCOPHAGE   MYRBETRIQ 25 MG Tb24 tablet Generic drug:  mirabegron ER     TAKE these medications   albuterol (2.5 MG/3ML) 0.083% nebulizer solution Commonly known as:  PROVENTIL Take 3 mLs (2.5 mg total) by nebulization every 6 (six) hours as needed for wheezing or shortness of breath.   allopurinol  300 MG tablet Commonly known as:  ZYLOPRIM TAKE 1 TABLET BY MOUTH ONCE DAILY   metoprolol tartrate 25 MG tablet Commonly known as:  LOPRESSOR Take 0.5 tablets (12.5 mg total) by mouth 2 (two) times daily.   simvastatin 20 MG tablet Commonly known as:  ZOCOR TAKE 1 TABLET BY MOUTH ONCE DAILY AT BEDTIME       Allergies  Allergen Reactions  . Peanut-Containing Drug Products    Consultations:  NeuroSurgery  Cardiology  Procedures/Studies: Ct Head Wo Contrast  Result Date: 10/12/2018 CLINICAL DATA:  Minor head trauma.  Syncope. EXAM: CT HEAD WITHOUT CONTRAST CT CERVICAL SPINE WITHOUT CONTRAST TECHNIQUE: Multidetector CT imaging of the head and cervical spine was performed following the standard protocol without intravenous contrast. Multiplanar CT image reconstructions of the cervical spine were also generated. COMPARISON:  Head CT 04/15/2017 FINDINGS: CT HEAD FINDINGS Brain: Minimal high-density thickening of the falx, see 4:29, measuring 3 mm in thickness. This is only convincing given comparison with 04/15/2017 head CT. Mild cerebral volume loss and chronic small vessel ischemic change in the white matter. No acute infarct, hydrocephalus, or or masslike finding Vascular: Atherosclerotic calcification Skull: Extensive scalp hematoma posteriorly on the right. No underlying calvarial fracture. Sinuses/Orbits: Negative Critical Value/emergent results were  called by telephone at the time of interpretation on 10/12/2018 at 6:41 pm to Dr. Cathren Laine , who verbally acknowledged these results. CT CERVICAL SPINE FINDINGS Alignment: No traumatic malalignment Skull base and vertebrae: Negative for fracture Soft tissues and spinal canal: No prevertebral fluid or swelling. No visible canal hematoma. Thyroid nodules measuring up to 3 cm on the right. Disc levels: Prominent C5-6 and C6-7 disc degeneration with disc protrusions and buttressing osteophyte impinging on the ventral cord. Upper chest:  Negative IMPRESSION: 1. Trace falcine subdural hemorrhage that is convincing based on a 2018 comparison. 2. Posterior scalp contusion without calvarial fracture. 3. Negative for cervical spine fracture. Electronically Signed   By: Marnee Spring M.D.   On: 10/12/2018 18:44   Ct Cervical Spine Wo Contrast  Result Date: 10/12/2018 CLINICAL DATA:  Minor head trauma.  Syncope. EXAM: CT HEAD WITHOUT CONTRAST CT CERVICAL SPINE WITHOUT CONTRAST TECHNIQUE: Multidetector CT imaging of the head and cervical spine was performed following the standard protocol without intravenous contrast. Multiplanar CT image reconstructions of the cervical spine were also generated. COMPARISON:  Head CT 04/15/2017 FINDINGS: CT HEAD FINDINGS Brain: Minimal high-density thickening of the falx, see 4:29, measuring 3 mm in thickness. This is only convincing given comparison with 04/15/2017 head CT. Mild cerebral volume loss and chronic small vessel ischemic change in the white matter. No acute infarct, hydrocephalus, or or masslike finding Vascular: Atherosclerotic calcification Skull: Extensive scalp hematoma posteriorly on the right. No underlying calvarial fracture. Sinuses/Orbits: Negative Critical Value/emergent results were called by telephone at the time of interpretation on 10/12/2018 at 6:41 pm to Dr. Cathren Laine , who verbally acknowledged these results. CT CERVICAL SPINE FINDINGS Alignment: No traumatic malalignment Skull base and vertebrae: Negative for fracture Soft tissues and spinal canal: No prevertebral fluid or swelling. No visible canal hematoma. Thyroid nodules measuring up to 3 cm on the right. Disc levels: Prominent C5-6 and C6-7 disc degeneration with disc protrusions and buttressing osteophyte impinging on the ventral cord. Upper chest: Negative IMPRESSION: 1. Trace falcine subdural hemorrhage that is convincing based on a 2018 comparison. 2. Posterior scalp contusion without calvarial fracture. 3. Negative for  cervical spine fracture. Electronically Signed   By: Marnee Spring M.D.   On: 10/12/2018 18:44   Dg Chest Portable 1 View  Result Date: 10/12/2018 CLINICAL DATA:  Syncope with fall today.  Atrial fibrillation. EXAM: PORTABLE CHEST 1 VIEW COMPARISON:  09/02/2005 FINDINGS: Heart size is within normal limits considering the AP portable technique. Pulmonary vascularity is normal. Lungs are clear. No significant bone abnormality. IMPRESSION: No active disease. Electronically Signed   By: Francene Boyers M.D.   On: 10/12/2018 18:10    Subjective: At bedside was doing well.  Denied any chest pain, lightheadedness or dizziness.  No nausea or vomiting.  Felt well morning and staff.  No other concerns or complaints at this time  Discharge Exam: Vitals:   10/15/18 0754 10/15/18 1123  BP: (!) 145/59 111/70  Pulse: (!) 56 (!) 57  Resp: 18 18  Temp: 98.2 F (36.8 C) 98.4 F (36.9 C)  SpO2: 100% 98%   Vitals:   10/14/18 2311 10/15/18 0400 10/15/18 0754 10/15/18 1123  BP: (!) 156/59 (!) 151/81 (!) 145/59 111/70  Pulse: (!) 57 61 (!) 56 (!) 57  Resp: 18 18 18 18   Temp: 98.8 F (37.1 C) 98.2 F (36.8 C) 98.2 F (36.8 C) 98.4 F (36.9 C)  TempSrc: Oral Oral  Oral  SpO2: 97% 100% 100% 98%  Weight:      Height:       General: Pt is alert, awake, not in acute distress Cardiovascular: RRR but on the slower side, S1/S2 +, no rubs, no gallops Respiratory: CTA bilaterally, no wheezing, no rhonchi Abdominal: Soft, NT, Distended due to body habitus, bowel sounds + Extremities: Trace edema, no cyanosis  The results of significant diagnostics from this hospitalization (including imaging, microbiology, ancillary and laboratory) are listed below for reference.    Microbiology: No results found for this or any previous visit (from the past 240 hour(s)).   Labs: BNP (last 3 results) No results for input(s): BNP in the last 8760 hours. Basic Metabolic Panel: Recent Labs  Lab 10/12/18 1742  10/12/18 1935 10/13/18 0432 10/14/18 0357 10/15/18 0903  NA 141  --  140 140 139  K 3.3*  --  3.6 3.5 3.7  CL 109  --  107 108 109  CO2 21*  --  22 25 23   GLUCOSE 74  --  217* 143* 129*  BUN 10  --  11 11 11   CREATININE 1.11*  --  1.09* 1.18* 1.08*  CALCIUM 9.0  --  8.7* 8.7* 8.9  MG  --  1.6* 2.3 1.9 1.9  PHOS  --   --   --  2.6 3.3   Liver Function Tests: Recent Labs  Lab 10/14/18 0357 10/15/18 0903  AST 23 17  ALT 17 13  ALKPHOS 55 58  BILITOT 0.8 0.6  PROT 5.7* 5.7*  ALBUMIN 3.0* 3.0*   No results for input(s): LIPASE, AMYLASE in the last 168 hours. No results for input(s): AMMONIA in the last 168 hours. CBC: Recent Labs  Lab 10/12/18 1742 10/13/18 0432 10/14/18 0357 10/15/18 0903  WBC 9.0 5.8 5.5 5.0  NEUTROABS  --   --  3.3 3.1  HGB 12.2 11.0* 11.5* 11.9*  HCT 39.4 34.7* 35.9* 38.6  MCV 84.9 83.8 83.5 83.9  PLT 208 204 189 175   Cardiac Enzymes: Recent Labs  Lab 10/12/18 2304 10/13/18 0432 10/13/18 0928  TROPONINI <0.03 <0.03 <0.03   BNP: Invalid input(s): POCBNP CBG: Recent Labs  Lab 10/14/18 1105 10/14/18 1627 10/14/18 2131 10/15/18 0642 10/15/18 1122  GLUCAP 227* 105* 123* 109* 290*   D-Dimer No results for input(s): DDIMER in the last 72 hours. Hgb A1c Recent Labs    10/13/18 0432  HGBA1C 5.2   Lipid Profile No results for input(s): CHOL, HDL, LDLCALC, TRIG, CHOLHDL, LDLDIRECT in the last 72 hours. Thyroid function studies No results for input(s): TSH, T4TOTAL, T3FREE, THYROIDAB in the last 72 hours.  Invalid input(s): FREET3 Anemia work up No results for input(s): VITAMINB12, FOLATE, FERRITIN, TIBC, IRON, RETICCTPCT in the last 72 hours. Urinalysis    Component Value Date/Time   COLORURINE YELLOW 04/16/2017 2200   APPEARANCEUR HAZY (A) 04/16/2017 2200   LABSPEC 1.013 04/16/2017 2200   PHURINE 5.0 04/16/2017 2200   GLUCOSEU NEGATIVE 04/16/2017 2200   HGBUR NEGATIVE 04/16/2017 2200   HGBUR negative 11/04/2010 0921    BILIRUBINUR 1+ 09/27/2018 1156   KETONESUR NEGATIVE 04/16/2017 2200   PROTEINUR Positive (A) 09/27/2018 1156   PROTEINUR NEGATIVE 04/16/2017 2200   UROBILINOGEN 1.0 09/27/2018 1156   UROBILINOGEN 0.2 11/04/2010 0921   NITRITE n 09/27/2018 1156   NITRITE POSITIVE (A) 04/16/2017 2200   LEUKOCYTESUR Moderate (2+) (A) 09/27/2018 1156   Sepsis Labs Invalid input(s): PROCALCITONIN,  WBC,  LACTICIDVEN Microbiology No results found for this or any previous visit (from the past  240 hour(s)).  Time coordinating discharge: 35 minutes  SIGNED:  Merlene Laughter, DO [facility] 10/15/2018, 12:28 PM Pager is on AMION  If 7PM-7AM, please contact night-coverage [email or web address] Password [email or web address]

## 2018-10-14 NOTE — Progress Notes (Signed)
Spoke with patient about her diabetes.  Family members were in the room and helped with information.  Patient has had diabetes for about 7-8 years and has been on Glypizide and Metformin. Patient states that she has been on insulin in the past, but could not state when or how long. Low blood sugars began to present problems last July 2018 per family member and that she only eats when she is hungry. Current fall may have been contributed by hypoglycemia. Sees PCP about every 3 months.   Recommend discontinuing oral agents. Patient waiting to be accepted at [facility] facility. May need to be on Novolog correction scale  per CBGs while in rehab. Will continue to monitor blood sugars while in the hospital.  Smith Mince RN BSN CDE Diabetes Coordinator Pager: [phone]  8am-5pm

## 2018-10-14 NOTE — Progress Notes (Signed)
No new suggestions.  See consult from yesterday. [facility] will sign off.   Medication Recommendations:  As on MAR.  Holding DOAC Other recommendations (labs, testing, etc):  NA Follow up as an outpatient:  Please call our service at discharge and arrange a follow with Dr. Royann Shivers or APP in about four weeks.

## 2018-10-14 NOTE — Care Management Obs Status (Signed)
MEDICARE OBSERVATION STATUS NOTIFICATION   Patient Details  Name: Sherri Pratt MRN: [id number] Date of Birth: 1946/03/26   Medicare Observation Status Notification Given:  Yes    Kermit Balo, RN 10/14/2018, 3:12 PM

## 2018-10-14 NOTE — Progress Notes (Signed)
Came into room due to bed alarm, pt pulled her IV out and husband getting her dress. They said they were told they were going home.

## 2018-10-14 NOTE — Progress Notes (Signed)
PROGRESS NOTE    Sherri Pratt  ZOX:[id number] DOB: [birth year] DOA: 10/12/2018 PCP: [name], NP   Brief Narrative:  HPI per Dr. Madelyn Flavors on 10/12/18 Sherri Pratt a 73 y.o.femalewith medical history significant ofPAFon Eliquis, HTN, HLD, DM, gout, and CKD stage III;who presents after having a fall. Patient lives at home with her husband and ambulates with use of a walker. Patient calls that her husband called out to her and the next thing she recalls is lying on the floor. Her husband witnessed the fall, but has dementia and unable to provide history. The patient's son found her lying on the floor disoriented not acting like normal. She normally does not feel lightheaded or dizzy when changing positions. Her sonsreport that she has been having episodesoflow blood sugars.He reports that she does not check her blood sugar regularly and does not eat regularly. She is on glipizide and metformin for treatment of her diabetes. Currently patient denies any recent fever, history of seizures, chest pain, palpitations, nausea, vomiting, diarrhea, dysuria,or Parkinson's dz. patient has also had multiple falls with the last occurring sometime last month. Lastly, family makes note that over the last few weeks she haddeveloped a constant tremor of bilateral upper extremities that is present even at rest.  En routewith EMS patient was noted to be in a wide-complex tachycardia with heart rates 180-200 that spontaneously converted to atrial fibrillation with controlled rate. Initial blood sugar was 54 and improved with fluids and snack to 102.  ED Course:Upon admission into the emergency department patient was noted to be afebrile vital signs relatively within normal limits. Labs revealed normal CBC, potassium 3.3, BUN 10, creatinine 1.11, and glucose 74.CT scansof the head and cervical spine revealed trace falcine subdural hemorrhage with posterior scalp contusion without  fracture. [place] [facility] was consulted and will see the patient. Patient was given 40 mEq of potassium chloride and an amp of dextrose. [facility] called to admit  **NeuroSurgery evaluated and they recommend no acute neurosurgical intervention at this time and recommending holding Eliquis for least 2 weeks and they also recommend no repeat imaging at this time if patient continues to do well clinically. Audiology was consulted for near syncope and feel that her hypoglycemia may have initiated this. They recommended continue Lopressor feel this is nothing to suggest ischemic heart disease as a potential etiology and recommending outpatient monitoring if warranted. Assessment & Plan:   Principal Problem:   Syncope and collapse Active Problems:   Hyperlipidemia   Essential hypertension   Chronic kidney disease   Paroxysmal atrial fibrillation (HCC)   Type 2 diabetes mellitus with hypoglycemia without coma (HCC)   Prolonged QT interval   Hypokalemia   SDH (subdural hematoma) (HCC)  Syncope and Collapse with subdural hematoma -Acute.Patient presents after having a fall. Patient initially found to be hypoglycemic and tachycardic with heart rates into the 180-200s. Suspect patient likely had syncopal event causing onset of symptoms.Cardiology feels that was mediated by her severe hypoglycemia -Placed in Obs Telemetry -C/wNeurochecks -Trend troponinand were <0.03 x3 -Continue with telemetry for now -Appreciate neurosurgery consultative servicesand they recommend holding Eliquis at least 2 weeks and recommend no Neurosurgical Intervention; They also recommend no repeat Imaging if doing well clinically -Physical therapy evaluated and recommending SNF and patient is agreeable so we have consulted the social worker for assistance with placement  Arrhythmia,AtrialFibrillation onChronicAnticoagulation -Now in NSR in the 60's -Patient was noted to have heart rates into the 180-200s  prior to arrival with EMS. -  She is followed by Dr. CroitoruCardiology.Cardiology consulted for further evaluation and recommendations -Family makes note that the patient has had recent falls. -CHA2DS2-VASc score =4(age, sex, HTN, and DM) -Check and replace electrolyte abnormalities -Hold Eliquisdue to subdural hematomabut Cardiology recommending possibly holding altogether indefinitely but will decide in outpatient setting. -Continue metoprolol12.5 mg po BID  Prolonged QTInterval -Initial QTc calculated at 520. -Check magnesiumandreplace ifneeded -Recheck EKG in a.m.  DiabetesMellitusType II withHypoglycemia -Patient noted blood sugars as low as 54 requiring and amp ofdextrose. -Last hemoglobin A1c noted to be 7.5 on 8/22. -Hypoglycemic protocols -Check hemoglobin A1cin a.mand was 5.2 -Hold metformin and glipizide hospitalized and will discontinue at discharge -CBGs q. before meals and at bedtime with sensitive SSI with meals -Will consult Diabetes Education Coordinator  -CBG's ranging from 95-2 27  Hypokalemia -Acute. Initial potassium noted be 3.3on admission. -Patient was given 40 mEq of potassium chloride. -K+ is now 3.5 and Mg is 1.9 -Continue to monitor andRepleteas needed -Repeat CMP in AM  Tremor -Acute. -Symptoms present over the last fewweeks per family. -Referral to neurology has been placed in the outpatient setting.  ChronicKidneyDiseaseStage 3 -Stable. Patient creatinine noted to be 1.11 on admission which appears slightly improved from previous baseline.This AM BUN/Cr was 11/1.18 -Continue to monitorand Trend Renal Fxn -Repeat CMP in AM  History ofGout -Continue Allopurinol 300 mg po Daily  Hyperlipidemia -Continue Simvastatin 20 mg po qHS  Obesity -Estimated body mass index is 30.14 kg/m as calculated from the following:   Height as of this encounter: 5\' 7"  (1.702 m).   Weight as of this  encounter: 87.3 kg. -Weight Loss Counseling given  Normocytic Anemia -Likely in the setting of chronic kidney disease -Patient's hemoglobin/hematocrit went from 11.0/34.7 is now 11.5/35.9 -Continue monitor for signs and symptoms of bleeding -Repeat CBC in a.m. and have outpatient anemia panel work-up done  DVT prophylaxis: SCDs; Eliquis being held Code Status: FULL CODE Family Communication: Discussed with Son at bedside Disposition Plan: SNF now that she is Agreeable   Consultants:   NeuroSurgery  Cardiology   Procedures: None   Antimicrobials:  Anti-infectives (From admission, onward)   None     Subjective: Seen and examined at bedside was doing okay.  Denies any chest pain, lightheadedness or dizziness.  Wanted to go home but when physical therapy evaluate her they deemed her not stable to go home and after much discussion the patient is agreeable to SNF.  No chest pain, lightheadedness or dizziness.  No nausea or vomiting.  Other concerns or questions time  Objective: Vitals:   10/14/18 0723 10/14/18 1216 10/14/18 1553 10/14/18 1926  BP: 140/78 130/64 (!) 152/66 (!) 155/75  Pulse: 63 (!) 59 (!) 54 62  Resp: 18 18 18 20   Temp: 98.2 F (36.8 C) 97.7 F (36.5 C) 98 F (36.7 C)   TempSrc: Oral Oral Oral   SpO2: 94% 93% 96% 97%  Weight:      Height:        Intake/Output Summary (Last 24 hours) at 10/14/2018 1931 Last data filed at 10/14/2018 1900 Gross per 24 hour  Intake 720 ml  Output 350 ml  Net 370 ml   Filed Weights   10/12/18 2205  Weight: 87.3 kg   Examination: Physical Exam:  Constitutional: Well-nourished, well-developed obese Caucasian female currently no acute distress appears calm and comfortable laying in bed Eyes: Lids and conjunctive are normal.  Sclera anicteric ENMT: External ears and nose appear normal.  Grossly normal hearing.  Mucous  members are moist Neck: Supple with no JVD Respiratory: Diminished to auscultation bilaterally no  appreciable wheezing, rales, rhonchi.  Patient not tachypneic using accessory muscles to breathe Cardiovascular: Regular rate and rhythm.  No appreciable murmurs, rubs, gallops.  Mild lower extremity non-pitting edema Abdomen: Soft, nontender, distended secondary body habitus.  Bowel sounds present all 4 quadrants GU: Deferred Musculoskeletal: No clubbing or cyanosis.  No joint deformities noted in the upper and lower extremities Skin: No appreciable rashes or lesions on limited skin evaluation Neurologic: No nerves II through XII grossly intact with no appreciable focal deficits.  Romberg sign and cerebellar reflexes were not assessed Psychiatric: Normal judgment insight.  Patient is awake alert and oriented x3.  Has a slightly flat affect  Data Reviewed: I have personally reviewed following labs and imaging studies  CBC: Recent Labs  Lab 10/08/18 1123 10/12/18 1742 10/13/18 0432 10/14/18 0357  WBC 10.2 9.0 5.8 5.5  NEUTROABS 8.0*  --   --  3.3  HGB 12.6 12.2 11.0* 11.5*  HCT 38.8 39.4 34.7* 35.9*  MCV 82.4 84.9 83.8 83.5  PLT 225.0 208 204 189   Basic Metabolic Panel: Recent Labs  Lab 10/08/18 1123 10/12/18 1742 10/12/18 1935 10/13/18 0432 10/14/18 0357  NA 144 141  --  140 140  K 3.7 3.3*  --  3.6 3.5  CL 107 109  --  107 108  CO2 25 21*  --  22 25  GLUCOSE 105* 74  --  217* 143*  BUN 15 10  --  11 11  CREATININE 1.16 1.11*  --  1.09* 1.18*  CALCIUM 9.3 9.0  --  8.7* 8.7*  MG  --   --  1.6* 2.3 1.9  PHOS  --   --   --   --  2.6   GFR: Estimated Creatinine Clearance: 48.2 mL/min (A) (by C-G formula based on SCr of 1.18 mg/dL (H)). Liver Function Tests: Recent Labs  Lab 10/14/18 0357  AST 23  ALT 17  ALKPHOS 55  BILITOT 0.8  PROT 5.7*  ALBUMIN 3.0*   No results for input(s): LIPASE, AMYLASE in the last 168 hours. No results for input(s): AMMONIA in the last 168 hours. Coagulation Profile: Recent Labs  Lab 10/12/18 1742  INR 1.37   Cardiac  Enzymes: Recent Labs  Lab 10/12/18 2304 10/13/18 0432 10/13/18 0928  TROPONINI <0.03 <0.03 <0.03   BNP (last 3 results) No results for input(s): PROBNP in the last 8760 hours. HbA1C: Recent Labs    10/13/18 0432  HGBA1C 5.2   CBG: Recent Labs  Lab 10/13/18 1614 10/13/18 2114 10/14/18 0629 10/14/18 1105 10/14/18 1627  GLUCAP 142* 247* 177* 227* 105*   Lipid Profile: No results for input(s): CHOL, HDL, LDLCALC, TRIG, CHOLHDL, LDLDIRECT in the last 72 hours. Thyroid Function Tests: No results for input(s): TSH, T4TOTAL, FREET4, T3FREE, THYROIDAB in the last 72 hours. Anemia Panel: No results for input(s): VITAMINB12, FOLATE, FERRITIN, TIBC, IRON, RETICCTPCT in the last 72 hours. Sepsis Labs: No results for input(s): PROCALCITON, LATICACIDVEN in the last 168 hours.  No results found for this or any previous visit (from the past 240 hour(s)).   Radiology Studies: No results found. Scheduled Meds: . allopurinol  300 mg Oral Daily  . insulin aspart  0-9 Units Subcutaneous TID WC  . metoprolol tartrate  12.5 mg Oral BID  . simvastatin  20 mg Oral QHS  . sodium chloride flush  3 mL Intravenous Q12H   Continuous Infusions:  LOS: 0 days   Merlene Laughter, DO [facility] PAGER is on AMION  If 7PM-7AM, please contact night-coverage [email or web address] Password [facility] 10/14/2018, 7:31 PM

## 2018-10-14 NOTE — Care Management Note (Signed)
Case Management Note  Patient Details  Name: Sherri Pratt MRN: [id number] Date of Birth: 12-Oct-1946  Subjective/Objective:     Pt in with syncope and collapse. She is from home with her spouse that has dementia. DME: walker Pt denies issue obtaining her medications.               Action/Plan: Recommendations are for SNF rehab. CM met with the patient and her family and they would like [facility]. They are agreeable to being faxed out in the [facility] area. CSW updated. CM following for d/c disposition.  Expected Discharge Date:                  Expected Discharge Plan:  Skilled Nursing Facility  In-House Referral:  Clinical Social Work  Discharge planning Services     Post Acute Care Choice:    Choice offered to:     DME Arranged:    DME Agency:     HH Arranged:    [place] Agency:     Status of Service:  In process, will continue to follow  If discussed at Long Length of Stay Meetings, dates discussed:    Additional Comments:  Pollie Friar, RN 10/14/2018, 3:25 PM

## 2018-10-15 DIAGNOSIS — Z79899 Other long term (current) drug therapy: Secondary | ICD-10-CM | POA: Diagnosis not present

## 2018-10-15 DIAGNOSIS — R413 Other amnesia: Secondary | ICD-10-CM | POA: Diagnosis not present

## 2018-10-15 DIAGNOSIS — R9431 Abnormal electrocardiogram [ECG] [EKG]: Secondary | ICD-10-CM | POA: Diagnosis not present

## 2018-10-15 DIAGNOSIS — G20 Parkinson's disease: Secondary | ICD-10-CM | POA: Diagnosis not present

## 2018-10-15 DIAGNOSIS — N189 Chronic kidney disease, unspecified: Secondary | ICD-10-CM | POA: Diagnosis not present

## 2018-10-15 DIAGNOSIS — I480 Paroxysmal atrial fibrillation: Secondary | ICD-10-CM | POA: Diagnosis not present

## 2018-10-15 DIAGNOSIS — E11649 Type 2 diabetes mellitus with hypoglycemia without coma: Secondary | ICD-10-CM | POA: Diagnosis not present

## 2018-10-15 DIAGNOSIS — N183 Chronic kidney disease, stage 3 (moderate): Secondary | ICD-10-CM | POA: Diagnosis not present

## 2018-10-15 DIAGNOSIS — M109 Gout, unspecified: Secondary | ICD-10-CM | POA: Diagnosis not present

## 2018-10-15 DIAGNOSIS — R251 Tremor, unspecified: Secondary | ICD-10-CM | POA: Diagnosis not present

## 2018-10-15 DIAGNOSIS — E785 Hyperlipidemia, unspecified: Secondary | ICD-10-CM | POA: Diagnosis not present

## 2018-10-15 DIAGNOSIS — Z7984 Long term (current) use of oral hypoglycemic drugs: Secondary | ICD-10-CM | POA: Diagnosis not present

## 2018-10-15 DIAGNOSIS — S065X0A Traumatic subdural hemorrhage without loss of consciousness, initial encounter: Secondary | ICD-10-CM | POA: Diagnosis not present

## 2018-10-15 DIAGNOSIS — D649 Anemia, unspecified: Secondary | ICD-10-CM | POA: Diagnosis not present

## 2018-10-15 DIAGNOSIS — I619 Nontraumatic intracerebral hemorrhage, unspecified: Secondary | ICD-10-CM | POA: Diagnosis not present

## 2018-10-15 DIAGNOSIS — R0602 Shortness of breath: Secondary | ICD-10-CM | POA: Diagnosis not present

## 2018-10-15 DIAGNOSIS — I4581 Long QT syndrome: Secondary | ICD-10-CM | POA: Diagnosis not present

## 2018-10-15 DIAGNOSIS — S065X9D Traumatic subdural hemorrhage with loss of consciousness of unspecified duration, subsequent encounter: Secondary | ICD-10-CM | POA: Diagnosis not present

## 2018-10-15 DIAGNOSIS — N182 Chronic kidney disease, stage 2 (mild): Secondary | ICD-10-CM | POA: Diagnosis not present

## 2018-10-15 DIAGNOSIS — R739 Hyperglycemia, unspecified: Secondary | ICD-10-CM | POA: Diagnosis not present

## 2018-10-15 DIAGNOSIS — I10 Essential (primary) hypertension: Secondary | ICD-10-CM | POA: Diagnosis not present

## 2018-10-15 DIAGNOSIS — E669 Obesity, unspecified: Secondary | ICD-10-CM | POA: Diagnosis not present

## 2018-10-15 DIAGNOSIS — R55 Syncope and collapse: Secondary | ICD-10-CM | POA: Diagnosis not present

## 2018-10-15 DIAGNOSIS — E876 Hypokalemia: Secondary | ICD-10-CM | POA: Diagnosis not present

## 2018-10-15 DIAGNOSIS — R2681 Unsteadiness on feet: Secondary | ICD-10-CM | POA: Diagnosis not present

## 2018-10-15 DIAGNOSIS — R062 Wheezing: Secondary | ICD-10-CM | POA: Diagnosis not present

## 2018-10-15 DIAGNOSIS — E119 Type 2 diabetes mellitus without complications: Secondary | ICD-10-CM | POA: Diagnosis not present

## 2018-10-15 DIAGNOSIS — I129 Hypertensive chronic kidney disease with stage 1 through stage 4 chronic kidney disease, or unspecified chronic kidney disease: Secondary | ICD-10-CM | POA: Diagnosis not present

## 2018-10-15 LAB — CBC WITH DIFFERENTIAL/PLATELET
Abs Immature Granulocytes: 0.02 10*3/uL (ref 0.00–0.07)
Basophils Absolute: 0 10*3/uL (ref 0.0–0.1)
Basophils Relative: 1 %
Eosinophils Absolute: 0.2 10*3/uL (ref 0.0–0.5)
Eosinophils Relative: 3 %
HCT: 38.6 % (ref 36.0–46.0)
Hemoglobin: 11.9 g/dL — ABNORMAL LOW (ref 12.0–15.0)
Immature Granulocytes: 0 %
Lymphocytes Relative: 25 %
Lymphs Abs: 1.3 10*3/uL (ref 0.7–4.0)
MCH: 25.9 pg — ABNORMAL LOW (ref 26.0–34.0)
MCHC: 30.8 g/dL (ref 30.0–36.0)
MCV: 83.9 fL (ref 80.0–100.0)
Monocytes Absolute: 0.5 10*3/uL (ref 0.1–1.0)
Monocytes Relative: 9 %
Neutro Abs: 3.1 10*3/uL (ref 1.7–7.7)
Neutrophils Relative %: 62 %
Platelets: 175 10*3/uL (ref 150–400)
RBC: 4.6 MIL/uL (ref 3.87–5.11)
RDW: 14.6 % (ref 11.5–15.5)
WBC: 5 10*3/uL (ref 4.0–10.5)
nRBC: 0 % (ref 0.0–0.2)

## 2018-10-15 LAB — COMPREHENSIVE METABOLIC PANEL
ALBUMIN: 3 g/dL — AB (ref 3.5–5.0)
ALT: 13 U/L (ref 0–44)
AST: 17 U/L (ref 15–41)
Alkaline Phosphatase: 58 U/L (ref 38–126)
Anion gap: 7 (ref 5–15)
BUN: 11 mg/dL (ref 8–23)
CO2: 23 mmol/L (ref 22–32)
Calcium: 8.9 mg/dL (ref 8.9–10.3)
Chloride: 109 mmol/L (ref 98–111)
Creatinine, Ser: 1.08 mg/dL — ABNORMAL HIGH (ref 0.44–1.00)
GFR calc Af Amer: 59 mL/min — ABNORMAL LOW (ref >60)
GFR calc non Af Amer: 51 mL/min — ABNORMAL LOW (ref >60)
GLUCOSE: 129 mg/dL — AB (ref 70–99)
Potassium: 3.7 mmol/L (ref 3.5–5.1)
Sodium: 139 mmol/L (ref 135–145)
Total Bilirubin: 0.6 mg/dL (ref 0.3–1.2)
Total Protein: 5.7 g/dL — ABNORMAL LOW (ref 6.5–8.1)

## 2018-10-15 LAB — GLUCOSE, CAPILLARY
Glucose-Capillary: 109 mg/dL — ABNORMAL HIGH (ref 70–99)
Glucose-Capillary: 290 mg/dL — ABNORMAL HIGH (ref 70–99)

## 2018-10-15 LAB — MAGNESIUM: Magnesium: 1.9 mg/dL (ref 1.7–2.4)

## 2018-10-15 LAB — PHOSPHORUS: Phosphorus: 3.3 mg/dL (ref 2.5–4.6)

## 2018-10-15 MED ORDER — ALBUTEROL SULFATE (2.5 MG/3ML) 0.083% IN NEBU: 2.5000 mg | INHALATION_SOLUTION | Freq: Four times a day (QID) | RESPIRATORY_TRACT | 12 refills | Status: DC | PRN

## 2018-10-15 NOTE — Progress Notes (Signed)
Report called to RN Alex at [facility]. Pt. Education provided to patient and son, Raaga Sudol. Pt. Discharged from facility via wheelchair.

## 2018-10-15 NOTE — Clinical Social Work Placement (Signed)
Nurse to call report to [phone]      CLINICAL SOCIAL WORK PLACEMENT  NOTE  Date:  10/15/2018  Patient Details  Name: Sherri Pratt MRN: [id number] Date of Birth: [birth year]  Clinical Social Work is seeking post-discharge placement for this patient at the Skilled  Nursing Facility level of care (*CSW will initial, date and re-position this form in  chart as items are completed):  Yes   Patient/family provided with [facility] Clinical Social Work Department's list of facilities offering this level of care within the geographic area requested by the patient (or if unable, by the patient's family).  Yes   Patient/family informed of their freedom to choose among providers that offer the needed level of care, that participate in Medicare, Medicaid or managed care program needed by the patient, have an available bed and are willing to accept the patient.  Yes   Patient/family informed of 's ownership interest in [facility] and [facility], as well as of the fact that they are under no obligation to receive care at these facilities.  PASRR submitted to EDS on       PASRR number received on       Existing PASRR number confirmed on 10/14/18     FL2 transmitted to all facilities in geographic area requested by pt/family on 10/14/18     FL2 transmitted to all facilities within larger geographic area on       Patient informed that his/her managed care company has contracts with or will negotiate with certain facilities, including the following:        Yes   Patient/family informed of bed offers received.  Patient chooses bed at [facility]     Physician recommends and patient chooses bed at      Patient to be transferred to [facility] on 10/15/18.  Patient to be transferred to facility by Family car     Patient family notified on 10/15/18 of transfer.  Name of family member notified:  Trey Paula     PHYSICIAN       Additional Comment:     _______________________________________________ Baldemar Lenis, LCSW 10/15/2018, 12:56 PM

## 2018-10-15 NOTE — Evaluation (Signed)
Occupational Therapy Evaluation Patient Details Name: Sherri BiddingJudy C Roat MRN: [id number] DOB: 23-Aug-1946 Today's Date: 10/15/2018    History of Present Illness 73yo female s/p fall, found to be tachycardic in EMS with rate 180-200BPM, spontaneously converted to A-fib in controlled pace, blood glucose 54. Cervical spine clear, found to have minor subdural hemorrhage. PMH CKD, DM, gout, HTN, PAF    Clinical Impression   Pt admitted with above. She demonstrates the below listed deficits and requires mod - total A for ADLs and mod A for functional mobility.  She moves very slowly and demonstrates significant difficulties with initiation.  PTA, she lived with her spouse and required assist with ADLs, per the chart.  Recommend SNF level rehab.  All further OT needs can be addressed at SNF.  OT will sign off at this time.     Follow Up Recommendations  SNF    Equipment Recommendations  None recommended by OT    Recommendations for Other Services       Precautions / Restrictions Precautions Precautions: Fall      Mobility Bed Mobility Overal bed mobility: Needs Assistance Bed Mobility: Supine to Sit     Supine to sit: Mod assist     General bed mobility comments: Pt able to move LEs off EOB, but unable to fully lift trunk and scoot hips to EOB   Transfers Overall transfer level: Needs assistance Equipment used: Rolling walker (2 wheeled) Transfers: Sit to/from UGI CorporationStand;Stand Pivot Transfers Sit to Stand: Mod assist Stand pivot transfers: Min assist       General transfer comment: Pt requires assist to intitiate activity.  Assist to move into standing, assist to maneuver RW, and assist to turn fully before sitting     Balance Overall balance assessment: Needs assistance Sitting-balance support: Feet supported;Single extremity supported Sitting balance-Leahy Scale: Fair     Standing balance support: Bilateral upper extremity supported Standing balance-Leahy Scale: Poor Standing  balance comment: reliant on bil. UE support and min A                            ADL either performed or assessed with clinical judgement   ADL Overall ADL's : Needs assistance/impaired Eating/Feeding: Set up;Sitting   Grooming: Wash/dry hands;Wash/dry face;Oral care;Brushing hair;Minimal assistance;Sitting   Upper Body Bathing: Moderate assistance;Sitting   Lower Body Bathing: Maximal assistance;Sit to/from stand   Upper Body Dressing : Maximal assistance;Sitting   Lower Body Dressing: Total assistance;Sit to/from stand   Toilet Transfer: Moderate assistance;Ambulation;Comfort height toilet;Regular Toilet;BSC;Grab bars;RW   Toileting- Clothing Manipulation and Hygiene: Total assistance;Sit to/from stand Toileting - Clothing Manipulation Details (indicate cue type and reason): Pt incontinent of urine.  Requires total A for clean up      Functional mobility during ADLs: Moderate assistance;Rolling walker       Vision Baseline Vision/History: Wears glasses Wears Glasses: At all times       Perception     Praxis Praxis Praxis tested?: Deficits Deficits: Initiation;Organization    Pertinent Vitals/Pain Pain Assessment: No/denies pain     Hand Dominance Right   Extremity/Trunk Assessment Upper Extremity Assessment Upper Extremity Assessment: Generalized weakness   Lower Extremity Assessment Lower Extremity Assessment: Generalized weakness   Cervical / Trunk Assessment Cervical / Trunk Assessment: Kyphotic   Communication Communication Communication: No difficulties(slow to respond )   Cognition Arousal/Alertness: Awake/alert Behavior During Therapy: Flat affect Overall Cognitive Status: No family/caregiver present to determine baseline cognitive functioning  General Comments: Pt is very slow to initiate activity and very slow to respond.  per chart, spouse has dementia and unable to provide reliable info  re: pt status    General Comments  spouse present     Exercises     Shoulder Instructions      Home Living Family/patient expects to be discharged to:: Skilled nursing facility                                 Additional Comments: spouse has dementia, unreliable historian and for decision making       Prior Functioning/Environment Level of Independence: Needs assistance  Gait / Transfers Assistance Needed: frequent falls recently, increased need for assist recently  ADL's / Homemaking Assistance Needed: Pt unable to provide accurate info             OT Problem List: Decreased strength;Decreased activity tolerance;Impaired balance (sitting and/or standing);Decreased safety awareness;Decreased cognition;Decreased knowledge of use of DME or AE      OT Treatment/Interventions:      OT Goals(Current goals can be found in the care plan section) Acute Rehab OT Goals Patient Stated Goal: did not state  OT Goal Formulation: All assessment and education complete, DC therapy  OT Frequency:     Barriers to D/C:            Co-evaluation              AM-PAC OT "6 Clicks" Daily Activity     Outcome Measure Help from another person eating meals?: A Little Help from another person taking care of personal grooming?: A Little Help from another person toileting, which includes using toliet, bedpan, or urinal?: A Lot Help from another person bathing (including washing, rinsing, drying)?: A Lot Help from another person to put on and taking off regular upper body clothing?: A Lot Help from another person to put on and taking off regular lower body clothing?: Total 6 Click Score: 13   End of Session Equipment Utilized During Treatment: Gait belt;Rolling walker Nurse Communication: Mobility status  Activity Tolerance: Patient tolerated treatment well Patient left: in chair;with call bell/phone within reach;with chair alarm set;with family/visitor present  OT Visit  Diagnosis: Unsteadiness on feet (R26.81)                Time: [id number] OT Time Calculation (min): 21 min Charges:  OT General Charges $OT Visit: 1 Visit OT Evaluation $OT Eval Moderate Complexity: 1 Mod  Jeani HawkingWendi Kourtni Stineman, OTR/L Acute Rehabilitation Services Pager [phone] Office [phone]    Jeani HawkingConarpe, Lizzette Carbonell M 10/15/2018, 1:05 PM

## 2018-10-18 NOTE — Progress Notes (Signed)
Sherri Pratt was seen today in the movement disorders clinic for neurologic consultation at the request of [name], NP.  The consultation is for the evaluation of tremor.  Prior records have been made available to me, including recent hospital records. No one accompanies the patient.  Tremor: Yes.     How long has it been going on? 2 months  At rest or with activation?  rest  Fam hx of tremor?  No.  Located where?  Both hands, L more than R.  Started in L hand first  Affected by caffeine: unknown (3 cokes per day)  Affected by alcohol:  Doesn't drink  Affected by stress:  No.  Affected by fatigue:  Yes.    Spills soup if on spoon:  No.  Spills glass of liquid if full:  No.  Affects ADL's (tying shoes, brushing teeth, etc):  No.  Tremor inducing meds:  No.  Other Specific Symptoms:  Voice: gotten weaker Sleep: sleeps well  Vivid Dreams:  No.  Acting out dreams:  No. Wet Pillows: No. Postural symptoms:  Yes.   - but overall my "balance is pretty good."  Uses walker x 6 months  Falls?  Yes.   - one was the syncopal episode recently and one fall in July.  Cannot state why she fell or how she fell but started using walker then Bradykinesia symptoms: shuffling gait, slow movements and difficulty getting out of a chair Loss of smell:  No. Loss of taste:  No. Urinary Incontinence:  Yes.  , sometimes wears undergarments Difficulty Swallowing:  No. Handwriting, micrographia: Yes.   Trouble with ADL's:  Yes.    Trouble buttoning clothing: No. Depression:  No. Memory changes:  No. (does monthly finances; husband does driving; pt hasn't driven x 6 weeks because "I just got tired of it and I don't drive any more.") Hallucinations:  No.  visual distortions: No. N/V:  No. Lightheaded:  No.  Syncope: Yes.   had recent syncopal episode at the end of December.  Hospital notes are reviewed.  Patient remembers walking with her walker and the next thing that she remembers is lying on the  floor.  Her husband apparently witnessed the event, but has dementia and was unable to provide any more appropriate history.  Her son found her lying on the floor, disoriented.  EMS noted that the patient was in wide-complex tachycardia with heart rates 180-200 which converted spontaneously to atrial fibrillation with controlled rate.  Her initial blood sugar was also 54.  As a result of the fall to the floor, she sustained a trace falcine subdural hemorrhage (I have reviewed those images), for which no intervention was required.  It was recommended that her Eliquis be held for 2 weeks.   Diplopia:  No. Dyskinesia:  No.    PREVIOUS MEDICATIONS: none to date  ALLERGIES:   Allergies  Allergen Reactions  . Peanut-Containing Drug Products     CURRENT MEDICATIONS:  Outpatient Encounter Medications as of 10/19/2018  Medication Sig  . albuterol (PROVENTIL) (2.5 MG/3ML) 0.083% nebulizer solution Take 3 mLs (2.5 mg total) by nebulization every 6 (six) hours as needed for wheezing or shortness of breath.  . allopurinol (ZYLOPRIM) 300 MG tablet TAKE 1 TABLET BY MOUTH ONCE DAILY (Patient taking differently: Take 300 mg by mouth daily. )  . apixaban (ELIQUIS) 5 MG TABS tablet Take 5 mg by mouth 2 (two) times daily.  . insulin aspart (NOVOLOG) 100 UNIT/ML injection Inject into the  skin.  . metoprolol tartrate (LOPRESSOR) 25 MG tablet Take 12.5 mg by mouth 2 (two) times daily.  . simvastatin (ZOCOR) 20 MG tablet TAKE 1 TABLET BY MOUTH ONCE DAILY AT BEDTIME (Patient taking differently: Take 20 mg by mouth at bedtime. )  . carbidopa-levodopa (SINEMET IR) 25-100 MG tablet Take 1 tablet by mouth 3 (three) times daily.  . [DISCONTINUED] metoprolol tartrate (LOPRESSOR) 25 MG tablet Take 0.5 tablets (12.5 mg total) by mouth 2 (two) times daily.   No facility-administered encounter medications on file as of 10/19/2018.     PAST MEDICAL HISTORY:   Past Medical History:  Diagnosis Date  . Anemia   . Chronic  kidney disease   . Diabetes mellitus   . Gout   . Hyperlipidemia   . Hypertension     PAST SURGICAL HISTORY:   Past Surgical History:  Procedure Laterality Date  . APPENDECTOMY      SOCIAL HISTORY:   Social History   Socioeconomic History  . Marital status: Married    Spouse name: Not on file  . Number of children: Not on file  . Years of education: Not on file  . Highest education level: Not on file  Occupational History  . Not on file  Social Needs  . Financial resource strain: Not on file  . Food insecurity:    Worry: Not on file    Inability: Not on file  . Transportation needs:    Medical: Not on file    Non-medical: Not on file  Tobacco Use  . Smoking status: Never Smoker  . Smokeless tobacco: Never Used  Substance and Sexual Activity  . Alcohol use: No  . Drug use: No  . Sexual activity: Not on file  Lifestyle  . Physical activity:    Days per week: Not on file    Minutes per session: Not on file  . Stress: Not on file  Relationships  . Social connections:    Talks on phone: Not on file    Gets together: Not on file    Attends religious service: Not on file    Active member of club or organization: Not on file    Attends meetings of clubs or organizations: Not on file    Relationship status: Not on file  . Intimate partner violence:    Fear of current or ex partner: Not on file    Emotionally abused: Not on file    Physically abused: Not on file    Forced sexual activity: Not on file  Other Topics Concern  . 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     FAMILY HISTORY:   Family Status  Relation Name Status  . Mother  Deceased  . Father  Deceased  . Sister  Alive  . Brother  Deceased  . Child x3 Alive    ROS:  Review of Systems  Constitutional: Negative.   HENT: Negative.   Eyes: Negative.   Respiratory:  Negative.   Cardiovascular: Negative.   Gastrointestinal: Negative.   Musculoskeletal: Negative.   Skin: Negative.   Endo/Heme/Allergies: Negative.   Psychiatric/Behavioral: Negative.     PHYSICAL EXAMINATION:    VITALS:   Vitals:   10/19/18 0840  BP: 110/70  Pulse: 72  SpO2: 98%    GEN:  The  patient appears stated age and is in NAD. HEENT:  Normocephalic.  The mucous membranes are moist. The superficial temporal arteries are without ropiness or tenderness. CV:  RRR Lungs:  CTAB Neck/HEME:  There are no carotid bruits bilaterally. She has ecchymosis in various stages of healing in the right posterior neck.   Neurological examination:  Orientation: The patient is alert and oriented x3. Montreal Cognitive Assessment  10/19/2018  Visuospatial/ Executive (0/5) 4  Naming (0/3) 3  Attention: Read list of digits (0/2) 1  Attention: Read list of letters (0/1) 1  Attention: Serial 7 subtraction starting at 100 (0/3) 1  Language: Repeat phrase (0/2) 1  Language : Fluency (0/1) 0  Abstraction (0/2) 2  Delayed Recall (0/5) 2  Orientation (0/6) 6  Total 21  Adjusted Score (based on education) 22   Cranial nerves: There is good facial symmetry.  There is facial hypomimia.   Pupils are equal round and reactive to light bilaterally. Fundoscopic exam is attempted but the disc margins are not well visualized bilaterally. Extraocular muscles are intact. She has intermittent eyelid opening apraxia.  The visual fields are full to confrontational testing. The speech is fluent and clear. She is hypophonic.  Soft palate rises symmetrically and there is no tongue deviation. Hearing is intact to conversational tone. Sensation: Sensation is intact to light and pinprick throughout (facial, trunk, extremities). Vibration is intact at the bilateral big toe. There is no extinction with double simultaneous stimulation. There is no sensory dermatomal level identified. Motor: Strength is 5/5 in the bilateral  upper and lower extremities, with the exception of intrinsic muscles of the hand and strength there is 4/5.   Shoulder shrug is equal and symmetric.  There is no pronator drift. Deep tendon reflexes: Deep tendon reflexes are 2/4 at the bilateral biceps, triceps, brachioradialis, patella, absent at the left patella and bilateral achilles. Plantar responses are downgoing bilaterally.  Movement examination: Tone: There is mild increased tone in the LUE.   Abnormal movements: there is bilateral UE resting tremor, L>R, mostly with distraction Coordination:  There is decremation with RAM's, with any form of RAMS, including alternating supination and pronation of the forearm, hand opening and closing, finger taps, heel taps and toe taps bilaterally, L more than right Gait and Station: The patient has difficulty arising out of a deep-seated chair without the use of the hands.  She requires assistance from 2 examiners to get out of the chair.  Once up, she is given a walker.  She is short stepped.    ASSESSMENT/PLAN:  1.  Parkinsonism.  While I do suspect that this is idiopathic Parkinson's disease, I cannot rule out an atypical state.  I suspect that the timeline is off and no one accompanies her to give a better history.  My suspicion is that tremor has been going on longer than 2 months.  It would be very unusual for her left-sided tremor to start and then go to the right side of the body within 2 months.  In addition, she is quite bradykinetic, and I would doubt that all started within the last 2 months.  She does have some eyelid opening apraxia, which can be seen with atypical states.  -We discussed the diagnosis as well as pathophysiology of the disease.  We discussed treatment options as well as prognostic indicators.  Patient education was provided.  -We discussed that it used to be thought that levodopa would increase risk of melanoma but now it is believed that  Parkinsons itself likely increases risk  of melanoma. she is to get regular skin checks.  -Greater than 50% of the 45 minute visit was spent in counseling answering questions and talking about what to expect now as well as in the future.  We talked about medication options as well as potential future surgical options.  We talked about safety in the home.  -We decided to add carbidopa/levodopa 25/100.  1/2 tab tid x 1 wk, then 1/2 in am & noon & 1 at night for a week, then 1/2 in am &1 at noon &night for a week, then 1 po tid.  Risks, benefits, side effects and alternative therapies were discussed.  The opportunity to ask questions was given and they were answered to the best of my ability.  The patient expressed understanding and willingness to follow the outlined treatment protocols.  -I we will add occupational and speech therapy to her physical therapy she is getting currently.  -I have some concern with the patient going back to living at her home.  Records indicate that the patient's husband had dementia, although the patient indicates that he was the primary driver in the home.   2.  Recent syncopal episode and subsequent small falcine hemorrhage  -Has been restarted back on Eliquis (records indicate that cardiology was not sure if she needed to go back on it at all given the fall).  Will need to be closely monitored given that she is a fall risk.  Had wide-complex tachycardia with heart rates in the 180s to 200s when EMS arrived.  Blood sugar was also low and her metformin and glipizide were discontinued (hemoglobin A1c was only 5.2). She spontaneously converted to atrial fibrillation with normal rate in the EMS.  Do not think syncopal episode was at all related to Parkinson's.  3.  Follow up is anticipated in the next 4 months, sooner should new neurologic issues arise.  Time mentioned above not include the 40 min of record review which was detailed above, which was non face to face time.   Cc:  [name], NP

## 2018-10-19 ENCOUNTER — Encounter: Payer: Self-pay | Admitting: Neurology

## 2018-10-19 ENCOUNTER — Ambulatory Visit (INDEPENDENT_AMBULATORY_CARE_PROVIDER_SITE_OTHER): Payer: Medicare Other | Admitting: Neurology

## 2018-10-19 VITALS — BP 110/70 | HR 72

## 2018-10-19 DIAGNOSIS — R413 Other amnesia: Secondary | ICD-10-CM

## 2018-10-19 DIAGNOSIS — G20A1 Parkinson's disease without dyskinesia, without mention of fluctuations: Secondary | ICD-10-CM | POA: Insufficient documentation

## 2018-10-19 DIAGNOSIS — R55 Syncope and collapse: Secondary | ICD-10-CM | POA: Diagnosis not present

## 2018-10-19 DIAGNOSIS — G20 Parkinson's disease: Secondary | ICD-10-CM | POA: Diagnosis not present

## 2018-10-19 DIAGNOSIS — I619 Nontraumatic intracerebral hemorrhage, unspecified: Secondary | ICD-10-CM

## 2018-10-19 MED ORDER — CARBIDOPA-LEVODOPA 25-100 MG PO TABS: 1.0000 | ORAL_TABLET | Freq: Three times a day (TID) | ORAL | 1 refills | Status: DC

## 2018-10-19 NOTE — Patient Instructions (Signed)
1. Start Carbidopa Levodopa as follows:  Take 1/2 tablet three times daily, at least 30 minutes before meals, for one week  Then take 1/2 tablet in the morning, 1/2 tablet in the afternoon, 1 tablet in the evening, at least 30 minutes before meals, for one week  Then take 1/2 tablet in the morning, 1 tablet in the afternoon, 1 tablet in the evening, at least 30 minutes before meals, for one week  Then take 1 tablet three times daily, at least 30 minutes before meals  

## 2018-10-21 ENCOUNTER — Telehealth: Payer: Self-pay | Admitting: Family Medicine

## 2018-10-21 NOTE — Telephone Encounter (Signed)
Tried to reach the pt by telephone.  Unable to leave a message on home or cell.  Will try again at a later time.

## 2018-10-21 NOTE — Telephone Encounter (Signed)
-----   Message from [name], NP sent at 10/20/2018  5:07 PM EST ----- Regarding: FW: Thyroid nodule seen on CT She had an incidental finding on imaging that was done in the hospital of a 3 cm nodule on her thyroid. I would like to have an ultrasound done to figure out if this is something we need to worry about. Please see if the patient is ok with this   I hope she is feeling better since her hospital visit  ----- Message ----- From: Clydie Braun, MD Sent: 10/19/2018  12:03 PM EST To: [name], NP Subject: Thyroid nodule seen on CT                      Got this message regarding the 3 cm incidental finding of a thyroid nodule.  Please follow-up with recommended ultrasound as outpatient. ----- Message ----- From: Audry Riles, MD Sent: 10/13/2018   2:39 PM EST To: Clydie Braun, MD  Hi Dr Katrinka Blazing,  There was an incidental thyroid nodule on this patient you admitted yesterday.  It did not make the impression, hence this communication.  It is of a size (3cm) where outpatient [place] is usually undertaken.   Thanks much,  Benito Mccreedy  [facility]

## 2018-10-26 NOTE — Telephone Encounter (Signed)
Tried to reach the pt.  No answer or machine.  Will try again at a later time. 

## 2018-10-29 DIAGNOSIS — R739 Hyperglycemia, unspecified: Secondary | ICD-10-CM | POA: Diagnosis not present

## 2018-10-29 DIAGNOSIS — E119 Type 2 diabetes mellitus without complications: Secondary | ICD-10-CM | POA: Diagnosis not present

## 2018-11-03 ENCOUNTER — Other Ambulatory Visit: Payer: Self-pay | Admitting: Cardiovascular Disease

## 2018-11-03 DIAGNOSIS — W19XXXD Unspecified fall, subsequent encounter: Secondary | ICD-10-CM | POA: Diagnosis not present

## 2018-11-03 DIAGNOSIS — D631 Anemia in chronic kidney disease: Secondary | ICD-10-CM | POA: Diagnosis not present

## 2018-11-03 DIAGNOSIS — I129 Hypertensive chronic kidney disease with stage 1 through stage 4 chronic kidney disease, or unspecified chronic kidney disease: Secondary | ICD-10-CM | POA: Diagnosis not present

## 2018-11-03 DIAGNOSIS — Z6830 Body mass index (BMI) 30.0-30.9, adult: Secondary | ICD-10-CM | POA: Diagnosis not present

## 2018-11-03 DIAGNOSIS — M1030 Gout due to renal impairment, unspecified site: Secondary | ICD-10-CM | POA: Diagnosis not present

## 2018-11-03 DIAGNOSIS — Z9181 History of falling: Secondary | ICD-10-CM | POA: Diagnosis not present

## 2018-11-03 DIAGNOSIS — E876 Hypokalemia: Secondary | ICD-10-CM | POA: Diagnosis not present

## 2018-11-03 DIAGNOSIS — E1122 Type 2 diabetes mellitus with diabetic chronic kidney disease: Secondary | ICD-10-CM | POA: Diagnosis not present

## 2018-11-03 DIAGNOSIS — E669 Obesity, unspecified: Secondary | ICD-10-CM | POA: Diagnosis not present

## 2018-11-03 DIAGNOSIS — S065X9D Traumatic subdural hemorrhage with loss of consciousness of unspecified duration, subsequent encounter: Secondary | ICD-10-CM | POA: Diagnosis not present

## 2018-11-03 DIAGNOSIS — E11649 Type 2 diabetes mellitus with hypoglycemia without coma: Secondary | ICD-10-CM | POA: Diagnosis not present

## 2018-11-03 DIAGNOSIS — N183 Chronic kidney disease, stage 3 (moderate): Secondary | ICD-10-CM | POA: Diagnosis not present

## 2018-11-03 DIAGNOSIS — E785 Hyperlipidemia, unspecified: Secondary | ICD-10-CM | POA: Diagnosis not present

## 2018-11-03 DIAGNOSIS — Z7984 Long term (current) use of oral hypoglycemic drugs: Secondary | ICD-10-CM | POA: Diagnosis not present

## 2018-11-03 DIAGNOSIS — I480 Paroxysmal atrial fibrillation: Secondary | ICD-10-CM | POA: Diagnosis not present

## 2018-11-03 DIAGNOSIS — G250 Essential tremor: Secondary | ICD-10-CM | POA: Diagnosis not present

## 2018-11-03 NOTE — Telephone Encounter (Signed)
Refill Request.  

## 2018-11-05 ENCOUNTER — Telehealth: Payer: Self-pay | Admitting: Adult Health

## 2018-11-05 NOTE — Telephone Encounter (Signed)
Ok for verbal orders ?

## 2018-11-05 NOTE — Telephone Encounter (Signed)
Copied from CRM [id number]. Topic: Quick Communication - Home Health Verbal Orders >> Nov 05, 2018  4:16 PM Maia Petties wrote: Caller/Agency: Rhunette Croft OT w/[facility] Callback Number: [phone], secure VM if not available Requesting OT/PT/Skilled Nursing/Social Work: OT Frequency: Plan of care VO request for OT 1x week 1 week, 2x week 1 week, 1x week 2 weeks for AIDL, instructions in pain control, ulcer prevention, ADL, transfers, and exercise.

## 2018-11-05 NOTE — Telephone Encounter (Signed)
I left a detailed message on Sherri Pratt's voicemail with the verbal order per below.

## 2018-11-09 NOTE — Telephone Encounter (Signed)
Pt has an appt on 11/10/2018.  Can discuss at that time.

## 2018-11-10 ENCOUNTER — Ambulatory Visit: Payer: Medicare Other | Admitting: Adult Health

## 2018-11-10 ENCOUNTER — Encounter: Payer: Self-pay | Admitting: Adult Health

## 2018-11-10 VITALS — BP 120/70 | Temp 97.6°F | Wt 188.0 lb

## 2018-11-10 DIAGNOSIS — S065X9A Traumatic subdural hemorrhage with loss of consciousness of unspecified duration, initial encounter: Secondary | ICD-10-CM

## 2018-11-10 DIAGNOSIS — S065XAA Traumatic subdural hemorrhage with loss of consciousness status unknown, initial encounter: Secondary | ICD-10-CM

## 2018-11-10 DIAGNOSIS — S065X9D Traumatic subdural hemorrhage with loss of consciousness of unspecified duration, subsequent encounter: Secondary | ICD-10-CM

## 2018-11-10 DIAGNOSIS — R55 Syncope and collapse: Secondary | ICD-10-CM

## 2018-11-10 DIAGNOSIS — G20 Parkinson's disease: Secondary | ICD-10-CM

## 2018-11-10 DIAGNOSIS — E139 Other specified diabetes mellitus without complications: Secondary | ICD-10-CM | POA: Diagnosis not present

## 2018-11-10 DIAGNOSIS — I480 Paroxysmal atrial fibrillation: Secondary | ICD-10-CM | POA: Diagnosis not present

## 2018-11-10 LAB — CBC WITH DIFFERENTIAL/PLATELET
Basophils Absolute: 0 10*3/uL (ref 0.0–0.1)
Basophils Relative: 0.5 % (ref 0.0–3.0)
Eosinophils Absolute: 0.1 10*3/uL (ref 0.0–0.7)
Eosinophils Relative: 1.6 % (ref 0.0–5.0)
HCT: 37.7 % (ref 36.0–46.0)
Hemoglobin: 12.3 g/dL (ref 12.0–15.0)
LYMPHS ABS: 1.3 10*3/uL (ref 0.7–4.0)
Lymphocytes Relative: 19.9 % (ref 12.0–46.0)
MCHC: 32.7 g/dL (ref 30.0–36.0)
MCV: 82.6 fl (ref 78.0–100.0)
MONO ABS: 0.4 10*3/uL (ref 0.1–1.0)
Monocytes Relative: 6.5 % (ref 3.0–12.0)
Neutro Abs: 4.8 10*3/uL (ref 1.4–7.7)
Neutrophils Relative %: 71.5 % (ref 43.0–77.0)
Platelets: 206 10*3/uL (ref 150.0–400.0)
RBC: 4.56 Mil/uL (ref 3.87–5.11)
RDW: 17.1 % — ABNORMAL HIGH (ref 11.5–15.5)
WBC: 6.8 10*3/uL (ref 4.0–10.5)

## 2018-11-10 LAB — BASIC METABOLIC PANEL
BUN: 11 mg/dL (ref 6–23)
CO2: 26 mEq/L (ref 19–32)
Calcium: 9.4 mg/dL (ref 8.4–10.5)
Chloride: 106 mEq/L (ref 96–112)
Creatinine, Ser: 1.07 mg/dL (ref 0.40–1.20)
GFR: 50.13 mL/min — ABNORMAL LOW (ref >60.00)
Glucose, Bld: 104 mg/dL — ABNORMAL HIGH (ref 70–99)
Potassium: 3.8 mEq/L (ref 3.5–5.1)
SODIUM: 142 meq/L (ref 135–145)

## 2018-11-10 LAB — MAGNESIUM: Magnesium: 1.8 mg/dL (ref 1.5–2.5)

## 2018-11-10 LAB — PHOSPHORUS: Phosphorus: 3 mg/dL (ref 2.3–4.6)

## 2018-11-10 NOTE — Progress Notes (Signed)
Subjective:    Patient ID: Gaspar BiddingJudy C Tignor, female    DOB: 07-17-1946, 73 y.o.   MRN: [id number]  HPI   73 year old female who  has a past medical history of Anemia, Chronic kidney disease, Diabetes mellitus, Gout, Hyperlipidemia, and Hypertension.  She presents to the office today for TCM visit   Admit Date: 10/12/2018 Discharge from hospital: 10/15/2017  Discharge from Rehab: 11/06/2018  Patient originally seen in the emergency room status post fall while at home.  Per ER note the patient called out to her husband the next thing she recalls is laying on the floor.  Her husband witnessed the fall, but has dementia and was unable to provide history in the emergency room.  The patient's son found her laying on the floor disoriented and not acting like normal.  Her son reported that she had been having episodes of low blood sugars and that she does not check her blood sugars on a regular basis nor does she eat regularly.  At this time she was on glipizide and metformin for treatment of her diabetes.    Of note patient has had multiple falls over the last month and has developed a constant tremor of her bilateral upper extremities, which she has been seen in this office for an referred to neurology prior to this hospitalization  In route with EMS patient was noted to be in a wide-complex tachycardia with heart rates of 1 80-200 that spontaneously converted to atrial fibrillation with controlled rate.  Her initial blood sugar was 54 and improved with fluids and a snack to 102.  Labs in the emergency room revealed a normal CBC, potassium 3.3, BUN 10, creatinine 1.11, and glucose 74.  CT scans of the head and cervical spine revealed trace falcine subdural hemorrhage with posterior scalp contusion without fracture.  [facility] neurosurgery was consulted and evaluated the patient, the recommendations was no acute surgical intervention at that time and recommended holding Eliquis for 2 weeks, they also  recommended no repeat imaging at this time if the patient continued to do clinically well.  Cardiology was consulted for near syncope and felt that her hypoglycemia may have initiated this.  They recommended to continue Lopressor and feel that this is nothing to suggest ischemic heart disease or potential etiology.  She was stable to be discharged to skilled nursing facility with follow-up with PCP, cardiology, and neurology in the outpatient setting  Hospital Course  Syncope and Collapse with subdural hematoma  -Suspected that patient likely had a syncopal event causing onset of symptoms from hypoglycemia.  She was placed on telemetry and observation units with neuro checks.  Trending troponins were negative x3.  As mentioned above she was discharged to skilled nursing facility  Arrhythmia Afib on Chronic Anticoagulation  - Upon discharge she was in sinus rhythm in the 60s. -Allergy was consulted in the hospital and she was advised to follow-up with her cardiologist, Dr. [name] as outpatient  -Hold Eliquis for 2 weeks due to subdural hematoma, cardiology recommended possibly holding altogether indefinitely but will decide in outpatient setting -Continue metoprolol 12.5 mg twice daily per cardiology  Prolonged QT Interval  -Initial QTc calculated at 520.  Recommend checking mag outpatient replace as needed.  Avoid QT prolonging agents  DM Type II with hypoglycemia  -Patient noted to blood sugars as low as 54 required an amp of dextrose -Most recent A1c in the hospital was 5.2 this is down from 7.5 on 8/22 -Metformin and glipizide  were held and discontinued at discharge -BG's in the hospital ranging from 105- 290   Hypokalemia  -Show potassium noted to be 3.3 on admission.  She was given 40 mEq of potassium chloride and K was 3.7 on discharge.  Mag was 1.9 on discharge.   Tremor  -While at skilled nursing facility she had an appointment with neurology, Dr. Arbutus Leasat suspected that her tremors  are from idiopathic Parkinson's disease, but could not rule out atypical state.  He was started on levodopa has a follow-up appointment with neurology in May.   Today in the office the patient is accompanied by herself.  She is not using a walker but states "it is in the backseat of the car".  She does have a slow but steady gait.  He has returned to taking Eliquis and is also returned to taking glipizide 10 mg BID.  He is not checking her blood sugars at home.  Reports that she is doing well and has not had any symptoms of hypoglycemia since being discharged from the hospital.  She denies any recent falls since being discharged from the hospital.  She feels as though her appetite is good and she is starting to gain back strength.  Per patient while at the skilled nursing facility they did not do much with her.  Patient denies participating in any physical therapy or occupational therapy.  Per patient "it was like being on a vacation, I rested and ate".  Since starting levodopa she has not noticed any improvement in her tremors, but she is taking it "day by day".   He has no acute complaints  Review of Systems  Constitutional: Negative.   HENT: Negative.   Eyes: Negative.   Respiratory: Negative.   Cardiovascular: Negative.   Gastrointestinal: Negative.   Endocrine: Negative.   Genitourinary: Negative.   Musculoskeletal: Positive for arthralgias and gait problem.  Allergic/Immunologic: Negative.   Psychiatric/Behavioral: Negative.   All other systems reviewed and are negative.  Past Medical History:  Diagnosis Date  . Anemia   . Chronic kidney disease   . Diabetes mellitus   . Gout   . Hyperlipidemia   . Hypertension     Social History   Socioeconomic History  . Marital status: Married    Spouse name: Not on file  . Number of children: Not on file  . Years of education: Not on file  . Highest education level: Not on file  Occupational History  . Not on file  Social Needs  .  Financial resource strain: Not on file  . Food insecurity:    Worry: Not on file    Inability: Not on file  . Transportation needs:    Medical: Not on file    Non-medical: Not on file  Tobacco Use  . Smoking status: Never Smoker  . Smokeless tobacco: Never Used  Substance and Sexual Activity  . Alcohol use: No  . Drug use: No  . Sexual activity: Not on file  Lifestyle  . Physical activity:    Days per week: Not on file    Minutes per session: Not on file  . Stress: Not on file  Relationships  . Social connections:    Talks on phone: Not on file    Gets together: Not on file    Attends religious service: Not on file    Active member of club or organization: Not on file    Attends meetings of clubs or organizations: Not on file  Relationship status: Not on file  . Intimate partner violence:    Fear of current or ex partner: Not on file    Emotionally abused: Not on file    Physically abused: Not on file    Forced sexual activity: Not on file  Other Topics Concern  . Not on file  Social History Narrative   Retired - She Forensic psychologist   Married for 51 years    Three children - all in [place]      She likes to quilting and shopping.     Past Surgical History:  Procedure Laterality Date  . APPENDECTOMY      Family History  Problem Relation Age of Onset  . Cancer Mother        colon  . Hypertension Mother   . Cancer Father        colon  . Healthy Sister   . Cancer Brother   . Healthy Child     Allergies  Allergen Reactions  . Peanut-Containing Drug Products     Current Outpatient Medications on File Prior to Visit  Medication Sig Dispense Refill  . albuterol (PROVENTIL) (2.5 MG/3ML) 0.083% nebulizer solution Take 3 mLs (2.5 mg total) by nebulization every 6 (six) hours as needed for wheezing or shortness of breath. 75 mL 12  . allopurinol (ZYLOPRIM) 300 MG tablet TAKE 1 TABLET BY MOUTH ONCE DAILY (Patient taking differently: Take 300 mg by mouth daily. ) 90  tablet 2  . carbidopa-levodopa (SINEMET IR) 25-100 MG tablet Take 1 tablet by mouth 3 (three) times daily. 270 tablet 1  . ELIQUIS 5 MG TABS tablet TAKE 1 TABLET BY MOUTH TWICE (2) DAILY 60 tablet 2  . glipiZIDE (GLUCOTROL) 10 MG tablet     . metoprolol tartrate (LOPRESSOR) 25 MG tablet TAKE 1/2 TABLET BY MOUTH 2 TIMES DAILY 30 tablet 0  . simvastatin (ZOCOR) 20 MG tablet TAKE 1 TABLET BY MOUTH ONCE DAILY AT BEDTIME (Patient taking differently: Take 20 mg by mouth at bedtime. ) 90 tablet 1   No current facility-administered medications on file prior to visit.     BP 120/70   Temp 97.6 F (36.4 C)   Wt 188 lb (85.3 kg)   BMI 29.44 kg/m       Objective:   Physical Exam Vitals signs and nursing note reviewed.  Constitutional:      Appearance: Normal appearance.  HENT:     Head: Normocephalic and atraumatic.     Right Ear: Tympanic membrane and ear canal normal.     Left Ear: Tympanic membrane and ear canal normal.  Eyes:     Extraocular Movements: Extraocular movements intact.     Pupils: Pupils are equal, round, and reactive to light.  Cardiovascular:     Rate and Rhythm: Normal rate and regular rhythm.     Pulses: Normal pulses.     Heart sounds: Normal heart sounds. No murmur. No friction rub. No gallop.   Pulmonary:     Effort: Pulmonary effort is normal. No respiratory distress.     Breath sounds: Normal breath sounds. No stridor. No wheezing, rhonchi or rales.  Musculoskeletal: Normal range of motion.  Skin:    General: Skin is warm and dry.  Neurological:     General: No focal deficit present.     Mental Status: She is alert and oriented to person, place, and time.     Sensory: No sensory deficit.     Coordination: Coordination abnormal.  Gait: Gait abnormal.     Comments: Bilateral upper extremity resting tremors L>R  Psychiatric:        Mood and Affect: Mood normal.        Behavior: Behavior normal.        Thought Content: Thought content normal.         Judgment: Judgment normal.        Assessment & Plan:  1. Diabetes 1.5, managed as type 1 (HCC) -Patient will be taken off glipizide 10 mg twice daily.  I will send in glipizide 2.5 mg extended release for the time being.  She was advised to follow-up with any hypoglycemic events..  Follow-up in 3 months for repeat A1c - CBC with Differential/Platelet - Basic Metabolic Panel - Iron, TIBC and Ferritin Panel - Magnesium - Phosphorus - glipiZIDE (GLUCOTROL XL) 2.5 MG 24 hr tablet; Take 1 tablet (2.5 mg total) by mouth daily with breakfast.  Dispense: 90 tablet; Refill: 1  2. Syncope and collapse -That this was likely due to hypoglycemia.  Advised patient to use walker at all times to prevent falling. - CBC with Differential/Platelet - Basic Metabolic Panel - Iron, TIBC and Ferritin Panel - Magnesium - Phosphorus  3. Paroxysmal atrial fibrillation (HCC) -Normal sinus rhythm in the office today.  She was advised to follow-up with cardiology to see if they want her to stay on chronic anticoagulation therapy. - CBC with Differential/Platelet - Basic Metabolic Panel - Iron, TIBC and Ferritin Panel - Magnesium - Phosphorus  4. Parkinson's disease (HCC) -Continue with levodopa.  Follow-up with neurology as directed  5. SDH (subdural hematoma) (HCC) -No further imaging needed today.  We will continue to monitor.  Patient advised of return precautions  [name], NP

## 2018-11-10 NOTE — Telephone Encounter (Signed)
Pt seen today in OV.  Nothing further needed.

## 2018-11-11 LAB — IRON,TIBC AND FERRITIN PANEL
%SAT: 18 % (calc) (ref 16–45)
Ferritin: 41 ng/mL (ref 16–288)
Iron: 60 ug/dL (ref 45–160)
TIBC: 338 mcg/dL (calc) (ref 250–450)

## 2018-11-11 MED ORDER — GLIPIZIDE ER 2.5 MG PO TB24: 2.5000 mg | ORAL_TABLET | Freq: Every day | ORAL | 1 refills | Status: DC

## 2018-11-11 NOTE — Addendum Note (Signed)
Addended by: Nancy Fetter on: 11/11/2018 04:59 PM   Modules accepted: Level of Service

## 2018-11-12 ENCOUNTER — Encounter: Payer: Self-pay | Admitting: Cardiovascular Disease

## 2018-11-12 ENCOUNTER — Ambulatory Visit: Payer: Medicare Other | Admitting: Cardiovascular Disease

## 2018-11-12 VITALS — BP 142/82 | HR 64 | Ht 66.0 in | Wt 188.0 lb

## 2018-11-12 DIAGNOSIS — E6609 Other obesity due to excess calories: Secondary | ICD-10-CM | POA: Insufficient documentation

## 2018-11-12 DIAGNOSIS — E782 Mixed hyperlipidemia: Secondary | ICD-10-CM

## 2018-11-12 DIAGNOSIS — E669 Obesity, unspecified: Secondary | ICD-10-CM

## 2018-11-12 DIAGNOSIS — I480 Paroxysmal atrial fibrillation: Secondary | ICD-10-CM | POA: Diagnosis not present

## 2018-11-12 DIAGNOSIS — I10 Essential (primary) hypertension: Secondary | ICD-10-CM

## 2018-11-12 DIAGNOSIS — E1169 Type 2 diabetes mellitus with other specified complication: Secondary | ICD-10-CM | POA: Diagnosis not present

## 2018-11-12 NOTE — Patient Instructions (Signed)
Medication Instructions:  Continue current medications If you need a refill on your cardiac medications before your next appointment, please call your pharmacy.    Follow-Up: At [facility], you and your health needs are our priority.  As part of our continuing mission to provide you with exceptional heart care, we have created designated Provider Care Teams.  These Care Teams include your primary Cardiologist (physician) and Advanced Practice Providers (APPs -  Physician Assistants and Nurse Practitioners) who all work together to provide you with the care you need, when you need it. You will need a follow up appointment in 12 months.  Please call our office 2 months in advance to schedule this appointment.  You may see Thurmon Fair, MD or one of the following Advanced Practice Providers on your designated Care Team: [place], [place] . Micah Flesher, PA-C  Any Other Special Instructions Will Be Listed Below (If Applicable).

## 2018-11-12 NOTE — Progress Notes (Signed)
Cardiology Office Note:    Date:  11/12/2018   ID:  [name], DOB 08-03-1946, MRN [id number]  PCP:  [name], NP  Cardiologist:  Thurmon Fair, MD    Referring MD: [name], NP   Chief complaint: Atrial fibrillation follow-up  History of Present Illness:    Sherri Pratt is a 73 y.o. female with a hx of atrial fibrillation initially detected during hospitalization for acute renal insufficiency due to hypovolemia 2018, again demonstrated on a 30-day event monitor later same year.    She was recently hospitalized after a fall on October 12, 2018.  The exact circumstances are a little confusing, but she reports that it happened while she was trying to sit down in a chair.  She was found to be severely hypoglycemic.  However during transportation to the hospital EMS reported "wide-complex tachycardia at 180-200 bpm".  This later settled into a pattern of "atrial fibrillation, rate controlled".  The ECG performed on arrival was interpreted as showing atrial fibrillation but is actually normal sinus rhythm with prominent tremor likely related to Parkinson's disease.  The cardiac monitoring strips throughout her hospitalization showed sinus rhythm, occasional PVCs and a very brief 3 beat run of nonsustained ventricular tachycardia but no convincing atrial or ventricular sustained arrhythmias.  She has a trivial falcine subdural hematoma and anticoagulation was temporarily held.  She is now back on Eliquis.  During the same hospitalization she was diagnosed with Parkinson's disease and is now taking carbidopa levodopa.  A thyroid nodule was incidentally detected.  The patient specifically denies any chest pain at rest exertion, dyspnea at rest or with exertion, orthopnea, paroxysmal nocturnal dyspnea, syncope, palpitations, focal neurological deficits, intermittent claudication, lower extremity edema, unexplained weight gain, cough, hemoptysis or wheezing.   Past Medical History:    Diagnosis Date  . Anemia   . Chronic kidney disease   . Diabetes mellitus   . Gout   . Hyperlipidemia   . Hypertension     Past Surgical History:  Procedure Laterality Date  . APPENDECTOMY      Current Medications: Current Meds  Medication Sig  . allopurinol (ZYLOPRIM) 300 MG tablet TAKE 1 TABLET BY MOUTH ONCE DAILY (Patient taking differently: Take 300 mg by mouth daily. )  . carbidopa-levodopa (SINEMET IR) 25-100 MG tablet Take 1 tablet by mouth 3 (three) times daily.  [name] ELIQUIS 5 MG TABS tablet TAKE 1 TABLET BY MOUTH TWICE (2) DAILY  . glipiZIDE (GLUCOTROL XL) 2.5 MG 24 hr tablet Take 1 tablet (2.5 mg total) by mouth daily with breakfast.  . metoprolol tartrate (LOPRESSOR) 25 MG tablet TAKE 1/2 TABLET BY MOUTH 2 TIMES DAILY  . simvastatin (ZOCOR) 20 MG tablet TAKE 1 TABLET BY MOUTH ONCE DAILY AT BEDTIME (Patient taking differently: Take 20 mg by mouth at bedtime. )     Allergies:   Peanut-containing drug products   Social History   Socioeconomic History  . Marital status: Married    Spouse name: Not on file  . Number of children: Not on file  . Years of education: Not on file  . Highest education level: Not on file  Occupational History  . Not on file  Social Needs  . Financial resource strain: Not on file  . Food insecurity:    Worry: Not on file    Inability: Not on file  . Transportation needs:    Medical: Not on file    Non-medical: Not on file  Tobacco Use  .  Smoking status: Never Smoker  . Smokeless tobacco: Never Used  Substance and Sexual Activity  . Alcohol use: No  . Drug use: No  . Sexual activity: Not on file  Lifestyle  . Physical activity:    Days per week: Not on file    Minutes per session: Not on file  . Stress: Not on file  Relationships  . Social connections:    Talks on phone: Not on file    Gets together: Not on file    Attends religious service: Not on file    Active member of club or organization: Not on file    Attends  meetings of clubs or organizations: Not on file    Relationship status: Not on file  Other Topics Concern  . Not on file  Social History Narrative   Retired - She Forensic psychologistsold insurance   Married for 51 years    Three children - all in [place]      She likes to quilting and shopping.     Family History: The patient's family history includes Cancer in her brother, father, and mother; Healthy in her child and sister; Hypertension in her mother. ROS:   Please see the history of present illness.   She complains of an oral ulcer and a fever blister, otherwise no new complaints. All other systems reviewed and are negative.  EKGs/Labs/Other Studies Reviewed:    EKG:  EKG is not ordered today.  The electrocardiogram from December 31 does not look that showing atrial fibrillation but actually shows sinus rhythm with a very prominent tremor artifact. Recent Labs: 10/08/2018: TSH 0.78 10/15/2018: ALT 13 11/10/2018: BUN 11; Creatinine, Ser 1.07; Hemoglobin 12.3; Magnesium 1.8; Platelets 206.0; Potassium 3.8; Sodium 142  Recent Lipid Panel    Component Value Date/Time   CHOL 150 12/01/2017 0911   TRIG 153.0 (H) 12/01/2017 0911   TRIG 184 (H) 09/04/2006 0838   HDL 44.70 12/01/2017 0911   CHOLHDL 3 12/01/2017 0911   VLDL 30.6 12/01/2017 0911   LDLCALC 75 12/01/2017 0911    Physical Exam:    VS:  BP (!) 142/82   Pulse 64   Ht 5\' 6"  (1.676 m)   Wt 188 lb (85.3 kg)   SpO2 98%   BMI 30.34 kg/m     Wt Readings from Last 3 Encounters:  11/12/18 188 lb (85.3 kg)  11/10/18 188 lb (85.3 kg)  10/12/18 192 lb 7.4 oz (87.3 kg)      General: Alert, oriented x3, no distress, uses a walker appears a little weak Head: Small herpes blister right upper lip no evidence of trauma, PERRL, EOMI, no exophtalmos or lid lag, no myxedema, no xanthelasma; normal ears, nose and oropharynx Neck: normal jugular venous pulsations and no hepatojugular reflux; brisk carotid pulses without delay and no carotid bruits Chest:  clear to auscultation, no signs of consolidation by percussion or palpation, normal fremitus, symmetrical and full respiratory excursions Cardiovascular: normal position and quality of the apical impulse, regular rhythm, normal first and second heart sounds, 1/6 early peaking systolic murmur, no diastolic murmurs, rubs or gallops Abdomen: no tenderness or distention, no masses by palpation, no abnormal pulsatility or arterial bruits, normal bowel sounds, no hepatosplenomegaly Extremities: no clubbing, cyanosis or edema; 2+ radial, ulnar and brachial pulses bilaterally; 2+ right femoral, posterior tibial and dorsalis pedis pulses; 2+ left femoral, posterior tibial and dorsalis pedis pulses; no subclavian or femoral bruits Neurological: grossly nonfocal except for prominent resting tremor especially in her left hand  Psych: Normal mood and affect   ASSESSMENT:    1. Paroxysmal atrial fibrillation (HCC)   2. Essential hypertension   3. Diabetes mellitus type 2 in obese (HCC)   4. Mixed hyperlipidemia   5. Mild obesity    PLAN:    In order of problems listed above:  1. AFib: Unfortunately I do not have the rhythm strips from EMS.  It's not inconceivable that the prominent tremor that was probably worse while she was hypoglycemic was mistaken for wide-complex tachycardia.  She did not have any serious wide-complex arrhythmia during her hospitalization.  Admission ECG was interpreted showing atrial fibrillation but that is erroneous.   Her episodes of atrial fibrillation have always been asymptomatic, so is hard to say with the burden of arrhythmia is, but she did have A. fib during a 30-day event monitor.  I see no reason to change her antiarrhythmic/rate control regimen.  She is on Eliquis.CHADSVasc 4 (age, gender, HTN, DM) 2. HTN: Well-controlled 3. DM: excessively controlled/syncope due to hypoglycemia, recent hemoglobin A1c 5.2% 4. HLP: on statin with excellent LDL cholesterol on lipid profile  earlier this year mild residual hypertriglyceridemia, consistent with insulin resistance. No change in meds 5. Obesity: Weight loss encouraged for better metabolic control as well as reduction in A. Fib prevalence.   Medication Adjustments/Labs and Tests Ordered: Current medicines are reviewed at length with the patient today.  Concerns regarding medicines are outlined above.  No orders of the defined types were placed in this encounter.  No orders of the defined types were placed in this encounter.   Signed, Thurmon FairMihai Errika Narvaiz, MD  11/12/2018 2:12 PM    [facility]

## 2018-11-17 ENCOUNTER — Other Ambulatory Visit: Payer: Self-pay | Admitting: Adult Health

## 2018-11-17 DIAGNOSIS — E041 Nontoxic single thyroid nodule: Secondary | ICD-10-CM

## 2018-11-17 NOTE — Telephone Encounter (Signed)
Sherri Pratt, pt has agreed to imaging.  Can you please place the order?

## 2018-11-22 ENCOUNTER — Ambulatory Visit
Admission: RE | Admit: 2018-11-22 | Discharge: 2018-11-22 | Disposition: A | Discharge status: Home / Self Care | Payer: Medicare Other | Source: Ambulatory Visit | Attending: Adult Health | Admitting: Adult Health

## 2018-11-22 DIAGNOSIS — E041 Nontoxic single thyroid nodule: Secondary | ICD-10-CM

## 2018-11-22 DIAGNOSIS — E042 Nontoxic multinodular goiter: Secondary | ICD-10-CM | POA: Diagnosis not present

## 2018-12-20 ENCOUNTER — Other Ambulatory Visit: Payer: Self-pay | Admitting: Adult Health

## 2018-12-20 ENCOUNTER — Telehealth: Payer: Self-pay | Admitting: Adult Health

## 2018-12-20 ENCOUNTER — Other Ambulatory Visit: Payer: Self-pay | Admitting: Cardiovascular Disease

## 2018-12-20 DIAGNOSIS — M10079 Idiopathic gout, unspecified ankle and foot: Secondary | ICD-10-CM

## 2018-12-20 DIAGNOSIS — E139 Other specified diabetes mellitus without complications: Secondary | ICD-10-CM

## 2018-12-20 NOTE — Telephone Encounter (Signed)
Copied from CRM [id number]. Topic: Quick Communication - Rx Refill/Question >> Dec 20, 2018 12:22 PM Arlyss Gandy, NT wrote: Medication: carbidopa-levodopa (SINEMET IR) 25-100 MG tablet  Pt is out of medication as of tomorrow  Has the patient contacted their pharmacy? Yes.   (Agent: If no, request that the patient contact the pharmacy for the refill.) (Agent: If yes, when and what did the pharmacy advise?)  Preferred Pharmacy (with phone number or street name): [facility] - [place] - [address] [phone] (Phone) [phone] (Fax)    Agent: Please be advised that RX refills may take up to 3 business days. We ask that you follow-up with your pharmacy.

## 2018-12-21 NOTE — Telephone Encounter (Signed)
Called and spoke to [place] at the pharmacy.  Informed her that [facility] does not fill the carpidopa.  Medication is filled by Dr. Elvera Lennox. Tat at [facility].  She will send the request to Dr. Arbutus Leas.  Nothing further needed.

## 2018-12-21 NOTE — Telephone Encounter (Signed)
Glipizide denied.  Pt no longer taking 10 mg.  The 2.5 mg tabs were sent in on 11/11/2018.  90 days given of allopurinol.  Nothing further needed.

## 2018-12-22 ENCOUNTER — Other Ambulatory Visit: Payer: Self-pay | Admitting: Neurology

## 2018-12-22 MED ORDER — CARBIDOPA-LEVODOPA 25-100 MG PO TABS: 1.0000 | ORAL_TABLET | Freq: Three times a day (TID) | ORAL | 1 refills | Status: DC

## 2018-12-23 DIAGNOSIS — M6281 Muscle weakness (generalized): Secondary | ICD-10-CM | POA: Diagnosis not present

## 2018-12-23 DIAGNOSIS — R2681 Unsteadiness on feet: Secondary | ICD-10-CM | POA: Diagnosis not present

## 2019-01-03 ENCOUNTER — Other Ambulatory Visit: Payer: Self-pay | Admitting: Adult Health

## 2019-01-03 DIAGNOSIS — E7801 Familial hypercholesterolemia: Secondary | ICD-10-CM

## 2019-01-11 ENCOUNTER — Other Ambulatory Visit: Payer: Self-pay | Admitting: Adult Health

## 2019-01-11 DIAGNOSIS — E139 Other specified diabetes mellitus without complications: Secondary | ICD-10-CM

## 2019-01-12 ENCOUNTER — Other Ambulatory Visit: Payer: Self-pay | Admitting: Adult Health

## 2019-01-12 DIAGNOSIS — E139 Other specified diabetes mellitus without complications: Secondary | ICD-10-CM

## 2019-01-12 NOTE — Telephone Encounter (Signed)
FILLED ON 11/11/2018 FOR 6 MONTHS.  REQUEST IS TOO EARLY.  REQUEST DENIED.

## 2019-01-12 NOTE — Telephone Encounter (Signed)
Correct. She is no longer taking

## 2019-01-12 NOTE — Telephone Encounter (Signed)
Stopped at discharge.  She is no longer taking, correct?

## 2019-01-13 ENCOUNTER — Other Ambulatory Visit: Payer: Self-pay | Admitting: Adult Health

## 2019-01-13 DIAGNOSIS — E139 Other specified diabetes mellitus without complications: Secondary | ICD-10-CM

## 2019-01-14 NOTE — Telephone Encounter (Signed)
Filled for 6 months on 11/11/2018.  Request is too early.

## 2019-02-10 ENCOUNTER — Other Ambulatory Visit: Payer: Self-pay | Admitting: Cardiovascular Disease

## 2019-02-10 NOTE — Telephone Encounter (Signed)
Pt last saw Dr Royann Shivers 11/12/18, last labs 11/10/18 Creat 1.07, age 73, weight 85.3kg, based on specified criteria pt is on appropriate dosage of Eliquis 5mg  BID.  Will refill rx.

## 2019-02-21 ENCOUNTER — Encounter: Payer: Self-pay | Admitting: Neurology

## 2019-03-01 ENCOUNTER — Ambulatory Visit: Payer: Medicare Other | Admitting: Neurology

## 2019-03-09 NOTE — Progress Notes (Signed)
Virtual Visit via Phone Note (scheduled for video, but then the patient was not able to receive text messages to get going for the video) The purpose of this virtual visit is to provide medical care while limiting exposure to the novel coronavirus.    Consent was obtained for telephone visit:  Yes.   Answered questions that patient had about telephone interaction:  Yes.   I discussed the limitations, risks, security and privacy concerns of performing an evaluation and management service by telephone. I also discussed with the patient that there may be a patient responsible charge related to this service. The patient expressed understanding and agreed to proceed.  Pt location: Home Physician Location: home Name of referring provider:  [name], NP I connected with [name] Michalec at patients initiation/request on 03/11/2019 at  9:00 AM EDT by telephone and verified that I am speaking with the correct person using two identifiers. Pt MRN:  [id number] Pt DOB:  11/26/1945 Video Participants:  [name];     History of Present Illness:  Patient is seen today in follow-up for newly diagnosed parkinsonism.  She was started on carbidopa/levodopa 25/100 last visit and worked up to 1 tablet 3 times per day.  She reports that she is taking this at 8am/12pm/6-7pm.  She states that she still has L arm tremor.  She does think it has helped movement.   He has had no further syncopal episodes (previously related to wide-complex tachycardia).  She denies any falls since last visit but does have trouble getting up off of the floor.  She did do home therapy since last visit x 6 weeks and she thinks that it helped some.  No lightheadedness or near syncope.  Notes are reviewed since last visit, but these have just been telephone visits and it does not appear that she has had any physician office visits since our last visit.  She denies any hallucinations.   Current Outpatient Medications on File Prior to  Visit  Medication Sig Dispense Refill   allopurinol (ZYLOPRIM) 300 MG tablet TAKE 1 TABLET BY MOUTH ONCE DAILY 90 tablet 0   carbidopa-levodopa (SINEMET IR) 25-100 MG tablet Take 1 tablet by mouth 3 (three) times daily. 270 tablet 1   ELIQUIS 5 MG TABS tablet TAKE 1 TABLET BY MOUTH TWICE (2) DAILY 60 tablet 6   glipiZIDE (GLUCOTROL XL) 2.5 MG 24 hr tablet Take 1 tablet (2.5 mg total) by mouth daily with breakfast. 90 tablet 1   metoprolol tartrate (LOPRESSOR) 25 MG tablet TAKE 1/2 TABLET BY MOUTH 2 TIMES DAILY 90 tablet 1   simvastatin (ZOCOR) 20 MG tablet Take 1 tablet (20 mg total) by mouth at bedtime. 90 tablet 0   No current facility-administered medications on file prior to visit.      Observations/Objective:   Vitals:   03/11/19 0830  Weight: 182 lb (82.6 kg)  Height: 5\' 6"  (1.676 m)   Unable, as had to do a telephone visit  Assessment and Plan:   1.  Parkinsonism.  While I do suspect that this is idiopathic Parkinson's disease, I cannot rule out an atypical state.  I suspect that the timeline is off and no one accompanies her to give a better history.  My suspicion is that tremor has been going on longer than 2 months.  It would be very unusual for her left-sided tremor to start and then go to the right side of the body within 2 months.  In addition, she  is quite bradykinetic, and I would doubt that all started within the last 2 months.  She does have some eyelid opening apraxia, which can be seen with atypical states.             -We will continue carbidopa/levodopa 25/100, 1 tablet 3 times per day.  I was unable to see her since she was unable to do a telephone visit (scheduled for video visit).  -Discussed concept of levodopa resistant tremor, although she may be underdosed.  It was obviously difficult to tell given that this was just a phone visit.  Did not want to change the medication because it was a phone visit.  She agreed.  -Discussed again the home situation.  As  previous, records have indicated that the patient's husband has dementia, although the patient indicates that he is the driver of the home.  Pt was able to provide history, including med hx, well today.  -Discussed in detail Covid-19 and risk factors for this, including age and PD.  Discussed importance of social distancing.  Discussed importance of staying home at all times, as is feasible.  Discussed taking advantage of grocery store hours for the elderly.  Pt expressed understanding.                2.  syncopal episode and subsequent small falcine hemorrhage             - on Eliquis (records indicate that cardiology was not sure if she needed to go back on it at all given the fall).  Will need to be closely monitored given that she is a fall risk.  Had wide-complex tachycardia with heart rates in the 180s to 200s when EMS arrived.  Blood sugar was also low and her metformin and glipizide were discontinued (hemoglobin A1c was only 5.2). She spontaneously converted to atrial fibrillation with normal rate in the EMS.  Do not think syncopal episode was at all related to Parkinson's.  Follow Up Instructions: 4-5 months.   -I discussed the assessment and treatment plan with the patient. The patient was provided an opportunity to ask questions and all were answered. The patient agreed with the plan and demonstrated an understanding of the instructions.   The patient was advised to call back or seek an in-person evaluation if the symptoms worsen or if the condition fails to improve as anticipated.    Total Time spent in visit with the patient was:  13 min, of which more than 50% of the time was spent in counseling and/or coordinating care on safety.   Pt understands and agrees with the plan of care outlined.     Kerin Salenebecca Alexsandra Shontz, DO

## 2019-03-11 ENCOUNTER — Telehealth (INDEPENDENT_AMBULATORY_CARE_PROVIDER_SITE_OTHER): Payer: Medicare Other | Admitting: Neurology

## 2019-03-11 ENCOUNTER — Encounter: Payer: Self-pay | Admitting: Neurology

## 2019-03-11 ENCOUNTER — Other Ambulatory Visit: Payer: Self-pay

## 2019-03-11 DIAGNOSIS — G20A1 Parkinson's disease without dyskinesia, without mention of fluctuations: Secondary | ICD-10-CM

## 2019-03-11 DIAGNOSIS — G20 Parkinson's disease: Secondary | ICD-10-CM | POA: Diagnosis not present

## 2019-03-14 DIAGNOSIS — H52223 Regular astigmatism, bilateral: Secondary | ICD-10-CM | POA: Diagnosis not present

## 2019-03-14 LAB — HM DIABETES EYE EXAM

## 2019-04-08 ENCOUNTER — Other Ambulatory Visit: Payer: Self-pay | Admitting: Adult Health

## 2019-04-08 DIAGNOSIS — E7801 Familial hypercholesterolemia: Secondary | ICD-10-CM

## 2019-04-08 DIAGNOSIS — M10079 Idiopathic gout, unspecified ankle and foot: Secondary | ICD-10-CM

## 2019-04-08 DIAGNOSIS — E139 Other specified diabetes mellitus without complications: Secondary | ICD-10-CM

## 2019-04-13 ENCOUNTER — Other Ambulatory Visit (INDEPENDENT_AMBULATORY_CARE_PROVIDER_SITE_OTHER): Payer: Medicare Other

## 2019-04-13 ENCOUNTER — Other Ambulatory Visit: Payer: Self-pay | Admitting: Adult Health

## 2019-04-13 ENCOUNTER — Other Ambulatory Visit: Payer: Self-pay

## 2019-04-13 DIAGNOSIS — E139 Other specified diabetes mellitus without complications: Secondary | ICD-10-CM | POA: Diagnosis not present

## 2019-04-13 DIAGNOSIS — M10079 Idiopathic gout, unspecified ankle and foot: Secondary | ICD-10-CM

## 2019-04-13 DIAGNOSIS — E7801 Familial hypercholesterolemia: Secondary | ICD-10-CM

## 2019-04-13 LAB — HEMOGLOBIN A1C: Hgb A1c MFr Bld: 6.5 % (ref 4.6–6.5)

## 2019-04-14 ENCOUNTER — Encounter: Payer: Self-pay | Admitting: Adult Health

## 2019-04-14 ENCOUNTER — Other Ambulatory Visit: Payer: Self-pay

## 2019-04-14 ENCOUNTER — Ambulatory Visit (INDEPENDENT_AMBULATORY_CARE_PROVIDER_SITE_OTHER): Payer: Medicare Other | Admitting: Adult Health

## 2019-04-14 DIAGNOSIS — E11649 Type 2 diabetes mellitus with hypoglycemia without coma: Secondary | ICD-10-CM | POA: Diagnosis not present

## 2019-04-14 NOTE — Progress Notes (Signed)
Virtual Visit via Telephone Note  I connected with Sherri Pratt on 04/14/19 at 11:30 AM EDT by telephone and verified that I am speaking with the correct person using two identifiers.   I discussed the limitations, risks, security and privacy concerns of performing an evaluation and management service by telephone and the availability of in person appointments. I also discussed with the patient that there may be a patient responsible charge related to this service. The patient expressed understanding and agreed to proceed.  Location patient: home Location provider: work or home office Participants present for the call: patient, provider Patient did not have a visit in the prior 7 days to address this/these issue(s).   History of Present Illness: 73 year old female who is being evaluated today for follow-up regarding diabetes.  She is currently maintained on glipizide 2.5 mg extended release.  He denies any episodes of hypoglycemia at home.  She does not monitor her blood sugars on a routine basis.  Last A1c was 5.14 October 2018, at this time she was can off glipizide 10 mg twice daily due to episodes of hypoglycemia.  A1c today is 6.5   Observations/Objective: Patient sounds cheerful and well on the phone. I do not appreciate any SOB. Speech and thought processing are grossly intact. Patient reported vitals:  Assessment and Plan: 1. Type 2 diabetes mellitus with hypoglycemia without coma, without long-term current use of insulin (HCC) -A1c is 6.5.  No change in medication at this time.  We will follow-up in 6 months or sooner if needed  Follow Up Instructions: I did not refer this patient for an OV in the next 24 hours for this/these issue(s).  I discussed the assessment and treatment plan with the patient. The patient was provided an opportunity to ask questions and all were answered. The patient agreed with the plan and demonstrated an understanding of the instructions.   The  patient was advised to call back or seek an in-person evaluation if the symptoms worsen or if the condition fails to improve as anticipated.  I provided 15 minutes of non-face-to-face time during this encounter.   Dorothyann Peng, NP

## 2019-04-21 ENCOUNTER — Encounter: Payer: Self-pay | Admitting: Family Medicine

## 2019-07-07 ENCOUNTER — Other Ambulatory Visit: Payer: Self-pay | Admitting: Neurology

## 2019-07-07 ENCOUNTER — Other Ambulatory Visit: Payer: Self-pay | Admitting: Adult Health

## 2019-07-07 ENCOUNTER — Other Ambulatory Visit: Payer: Self-pay | Admitting: Cardiovascular Disease

## 2019-07-07 DIAGNOSIS — E139 Other specified diabetes mellitus without complications: Secondary | ICD-10-CM

## 2019-07-07 DIAGNOSIS — M10079 Idiopathic gout, unspecified ankle and foot: Secondary | ICD-10-CM

## 2019-08-16 NOTE — Progress Notes (Signed)
Sherri Pratt was seen today in follow up for parkinsonism.  Husband present and supplements the history but he appears to have significant memory change.   Pt denies falls.  Pt denies lightheadedness, near syncope (has had this in the past related to wide-complex tachycardia).  No hallucinations.  Mood has been good.  I have reviewed medical record since our last visit.  Last saw primary care on July 2.  This, however, was just a telephone visit.  Current prescribed movement disorder medications: Carbidopa/levodopa 25/100, 1 tablet 3 times per day   PREVIOUS MEDICATIONS: Sinemet  ALLERGIES:   Allergies  Allergen Reactions  . Peanut-Containing Drug Products     CURRENT MEDICATIONS:  Outpatient Encounter Medications as of 08/18/2019  Medication Sig  . allopurinol (ZYLOPRIM) 300 MG tablet TAKE 1 TABLET BY MOUTH ONCE DAILY  . carbidopa-levodopa (SINEMET IR) 25-100 MG tablet TAKE 1 TABLET BY MOUTH 3 TIMES DAILY  . ELIQUIS 5 MG TABS tablet TAKE 1 TABLET BY MOUTH TWICE (2) DAILY  . glipiZIDE (GLUCOTROL XL) 2.5 MG 24 hr tablet TAKE 1 TABLET BY MOUTH ONCE A DAY WITH BREAKFAST.  . metoprolol tartrate (LOPRESSOR) 25 MG tablet TAKE 1/2 TABLET BY MOUTH TWICE DAILY  . simvastatin (ZOCOR) 20 MG tablet TAKE 1 TABLET BY MOUTH EVERY NIGHT AT BEDTIME   No facility-administered encounter medications on file as of 08/18/2019.     PAST MEDICAL HISTORY:   Past Medical History:  Diagnosis Date  . Anemia   . Chronic kidney disease   . Diabetes mellitus   . Gout   . Hyperlipidemia   . Hypertension     PAST SURGICAL HISTORY:   Past Surgical History:  Procedure Laterality Date  . APPENDECTOMY      SOCIAL HISTORY:   Social History   Socioeconomic History  . Marital status: Married    Spouse name: Not on file  . Number of children: Not on file  . Years of education: Not on file  . Highest education level: Not on file  Occupational History  . Not on file  Social Needs  . Financial  resource strain: Not on file  . Food insecurity    Worry: Not on file    Inability: Not on file  . Transportation needs    Medical: Not on file    Non-medical: Not on file  Tobacco Use  . Smoking status: Never Smoker  . Smokeless tobacco: Never Used  Substance and Sexual Activity  . Alcohol use: No  . Drug use: No  . Sexual activity: Not on file  Lifestyle  . Physical activity    Days per week: Not on file    Minutes per session: Not on file  . Stress: Not on file  Relationships  . Social Musician on phone: Not on file    Gets together: Not on file    Attends religious service: Not on file    Active member of club or organization: Not on file    Attends meetings of clubs or organizations: Not on file    Relationship status: Not on file  . Intimate partner violence    Fear of current or ex partner: Not on file    Emotionally abused: Not on file    Physically abused: Not on file    Forced sexual activity: Not on file  Other Topics Concern  . Not on file  Social History Narrative   Retired - She Forensic psychologist  Married for 51 years    Three children - all in       She likes to [place] and shopping.     FAMILY HISTORY:   Family Status  Relation Name Status  . Mother  Deceased  . Father  Deceased  . Sister  Alive  . Brother  Deceased  . Child x3 Alive    ROS:  Review of Systems  Constitutional: Negative.   HENT: Negative.   Eyes: Negative.   Cardiovascular: Negative.   Gastrointestinal: Negative.   Genitourinary: Negative.   Skin: Negative.     PHYSICAL EXAMINATION:    VITALS:   Vitals:   08/18/19 1441  BP: (!) 170/90  Pulse: (!) 58  Weight: 215 lb (97.5 kg)  Height: 5\' 4"  (1.626 m)    GEN:  The patient appears stated age and is in NAD.  She is nervous/anxious HEENT:  Normocephalic, atraumatic.  The mucous membranes are moist. The superficial temporal arteries are without ropiness or tenderness. CV:  Loletha Grayer.  regular Lungs:  CTAB  Neck/HEME:  There are no carotid bruits bilaterally.  Pt last took carbidopa/levodopa 25/100 at 8:30 am and seen at 2:45 pm.  Neurological examination:  Orientation: The patient is alert and oriented x3. Cranial nerves: There is good facial symmetry with facial hypomimia. The speech is fluent and clear. Soft palate rises symmetrically and there is no tongue deviation. Hearing is intact to conversational tone. Sensation: Sensation is intact to light touch throughout Motor: Strength is at least antigravity x4.  Movement examination: Tone: There is mild increased tone in the LUE Abnormal movements: there is mod amplitude LUE rest tremor Coordination:  There is mild decremation with RAM's, with any form of RAMS, including alternating supination and pronation of the forearm, hand opening and closing, finger taps, heel taps and toe taps, L more than R Gait and Station: The patient has mild difficulty arising out of a deep-seated chair without the use of the hands. The patient's stride length is decreased and she has DOE with ambulation.    ASSESSMENT/PLAN:  1.Parkinsonism.While I do suspect that this is idiopathic Parkinson's disease, I cannot rule out an atypical state. I suspect that the timeline is off and her husband is not a good historian. It would be very unusual for her left-sided tremor to start and then go to the right side of the body within 2 months. In addition, she is quite bradykinetic, and I would doubt that all started within the last 2 months.  -We will increase her carbidopa/levodopa 25/100, 2/2/1.  She was underdosed today but she also didn't take her middle of the day dosage.  Talked about setting an alarm.               -Discussed concept of levodopa resistant tremor, although she may be underdosed.    2.syncopal episode and subsequent small falcine hemorrhage - on Eliquis (records indicate that cardiology was not sure if she  needed to go back on it at all given the fall).Will need to be closely monitored given that she is a fall risk. Had wide-complex tachycardia with heart rates in the 180s to 200s when EMS arrived. Blood sugar was also low and her metformin and glipizide were discontinued (hemoglobin A1c was only 5.2). She spontaneously converted to atrial fibrillation with normal rate in the EMS.Do not think syncopal episode was at all related to Parkinson's.  3.  Follow up is anticipated in the next 4-6 months, sooner should new neurologic  issues arise.  Much greater than 50% of this visit was spent in counseling and coordinating care.  Total face to face time:  25 min  Cc:  Shirline FreesNafziger, Cory, NP

## 2019-08-18 ENCOUNTER — Encounter: Payer: Self-pay | Admitting: Neurology

## 2019-08-18 ENCOUNTER — Other Ambulatory Visit: Payer: Self-pay

## 2019-08-18 ENCOUNTER — Ambulatory Visit: Payer: Medicare Other | Admitting: Neurology

## 2019-08-18 VITALS — BP 170/90 | HR 58 | Ht 64.0 in | Wt 215.0 lb

## 2019-08-18 DIAGNOSIS — G20 Parkinson's disease: Secondary | ICD-10-CM | POA: Diagnosis not present

## 2019-08-18 DIAGNOSIS — G20A1 Parkinson's disease without dyskinesia, without mention of fluctuations: Secondary | ICD-10-CM

## 2019-08-18 MED ORDER — CARBIDOPA-LEVODOPA 25-100 MG PO TABS: ORAL_TABLET | ORAL | 1 refills | Status: DC

## 2019-08-18 NOTE — Patient Instructions (Addendum)
Take carbidopa/levodopa 25/100, 2 tablets at 8:30 am, 2 tablets at 12:30 pm, 1 tablet at 4:30 pm.  Take this medication on an empty stomach or take it with carbohydrate.  Keep this medication away from protein by about 30 min.  The physicians and staff at [facility] are committed to providing excellent care. You may receive a survey requesting feedback about your experience at our office. We strive to receive "very good" responses to the survey questions. If you feel that your experience would prevent you from giving the office a "very good " response, please contact our office to try to remedy the situation. We may be reached at [phone]. Thank you for taking the time out of your busy day to complete the survey.

## 2019-09-19 ENCOUNTER — Other Ambulatory Visit: Payer: Self-pay | Admitting: Adult Health

## 2019-09-19 DIAGNOSIS — E139 Other specified diabetes mellitus without complications: Secondary | ICD-10-CM

## 2019-09-19 DIAGNOSIS — E78019 Familial hypercholesterolemia, unspecified: Secondary | ICD-10-CM

## 2019-09-19 DIAGNOSIS — M10079 Idiopathic gout, unspecified ankle and foot: Secondary | ICD-10-CM

## 2019-09-19 DIAGNOSIS — E7801 Familial hypercholesterolemia: Secondary | ICD-10-CM

## 2019-09-20 ENCOUNTER — Other Ambulatory Visit: Payer: Self-pay | Admitting: Cardiovascular Disease

## 2019-09-21 ENCOUNTER — Other Ambulatory Visit: Payer: Self-pay | Admitting: Family Medicine

## 2019-09-21 DIAGNOSIS — E11649 Type 2 diabetes mellitus with hypoglycemia without coma: Secondary | ICD-10-CM

## 2019-09-21 NOTE — Telephone Encounter (Signed)
Pt scheduled for follow up and medication sent to the pharmacy.

## 2019-10-18 ENCOUNTER — Other Ambulatory Visit: Payer: Medicare Other

## 2019-10-19 ENCOUNTER — Telehealth: Payer: Medicare Other | Admitting: Adult Health

## 2019-10-25 ENCOUNTER — Other Ambulatory Visit: Payer: Self-pay

## 2019-10-25 ENCOUNTER — Other Ambulatory Visit (INDEPENDENT_AMBULATORY_CARE_PROVIDER_SITE_OTHER): Payer: Medicare Other

## 2019-10-25 DIAGNOSIS — E11649 Type 2 diabetes mellitus with hypoglycemia without coma: Secondary | ICD-10-CM | POA: Diagnosis not present

## 2019-10-25 LAB — HEMOGLOBIN A1C: Hgb A1c MFr Bld: 6.1 % (ref 4.6–6.5)

## 2019-10-26 ENCOUNTER — Telehealth (INDEPENDENT_AMBULATORY_CARE_PROVIDER_SITE_OTHER): Payer: Medicare Other | Admitting: Adult Health

## 2019-10-26 ENCOUNTER — Encounter: Payer: Self-pay | Admitting: Adult Health

## 2019-10-26 VITALS — Ht 64.0 in | Wt 215.0 lb

## 2019-10-26 DIAGNOSIS — E11649 Type 2 diabetes mellitus with hypoglycemia without coma: Secondary | ICD-10-CM

## 2019-10-26 LAB — MICROALBUMIN, URINE: Microalb, Ur: 0.6 mg/dL

## 2019-10-26 NOTE — Progress Notes (Signed)
Virtual Visit via Telephone Note  I connected with Sherri Pratt on 10/26/19 at 10:30 AM EST by telephone and verified that I am speaking with the correct person using two identifiers.   I discussed the limitations, risks, security and privacy concerns of performing an evaluation and management service by telephone and the availability of in person appointments. I also discussed with the patient that there may be a patient responsible charge related to this service. The patient expressed understanding and agreed to proceed.  Location patient: home Location provider: work or home office Participants present for the call: patient, provider Patient did not have a visit in the prior 7 days to address this/these issue(s).   History of Present Illness: 74 year old female who is being evaluated today for 71-month follow-up regarding diabetes mellitus.  She is currently maintained on glipizide 2.5 mg extended release.  She denies any episodes of hypoglycemia but does not monitor her blood sugars at home.  Her last A1c in July was 6.5.  She reports that overall she is doing fairly well.  She denies any recent falls.  Has no acute complaints.  She did report that "I ate a little bit too much over the holiday".   Observations/Objective: Patient sounds cheerful and well on the phone. I do not appreciate any SOB. Speech and thought processing are grossly intact. Patient reported vitals:  Assessment and Plan:  1. Type 2 diabetes mellitus with hypoglycemia without coma, without long-term current use of insulin (HCC) - A1c is 6.1  - Will continue on Glipizide 2.5 mg XR - She will follow up this spring for her CPE    Follow Up Instructions:  I did not refer this patient for an OV in the next 24 hours for this/these issue(s).  I discussed the assessment and treatment plan with the patient. The patient was provided an opportunity to ask questions and all were answered. The patient agreed with the plan  and demonstrated an understanding of the instructions.   The patient was advised to call back or seek an in-person evaluation if the symptoms worsen or if the condition fails to improve as anticipated.  I provided 12 minutes of non-face-to-face time during this encounter.   Sherri Frees, NP

## 2019-10-28 ENCOUNTER — Telehealth: Payer: Medicare Other | Admitting: Physician Assistant

## 2019-11-07 ENCOUNTER — Telehealth (INDEPENDENT_AMBULATORY_CARE_PROVIDER_SITE_OTHER): Payer: Medicare Other | Admitting: Family Medicine

## 2019-11-07 DIAGNOSIS — I480 Paroxysmal atrial fibrillation: Secondary | ICD-10-CM

## 2019-11-07 DIAGNOSIS — E669 Obesity, unspecified: Secondary | ICD-10-CM

## 2019-11-07 DIAGNOSIS — E1169 Type 2 diabetes mellitus with other specified complication: Secondary | ICD-10-CM | POA: Diagnosis not present

## 2019-11-07 DIAGNOSIS — E782 Mixed hyperlipidemia: Secondary | ICD-10-CM | POA: Diagnosis not present

## 2019-11-07 DIAGNOSIS — I10 Essential (primary) hypertension: Secondary | ICD-10-CM | POA: Diagnosis not present

## 2019-11-07 NOTE — Patient Instructions (Signed)
Medication Instructions:  Your physician recommends that you continue on your current medications as directed. Please refer to the Current Medication list given to you today.  *If you need a refill on your cardiac medications before your next appointment, please call your pharmacy*  Lab Work: NONE ordered at this time of appointment   If you have labs (blood work) drawn today and your tests are completely normal, you will receive your results only by: . MyChart Message (if you have MyChart) OR . A paper copy in the mail If you have any lab test that is abnormal or we need to change your treatment, we will call you to review the results.  Testing/Procedures: NONE ordered at this time of appointment   Follow-Up: At [facility], you and your health needs are our priority.  As part of our continuing mission to provide you with exceptional heart care, we have created designated Provider Care Teams.  These Care Teams include your primary Cardiologist (physician) and Advanced Practice Providers (APPs -  Physician Assistants and Nurse Practitioners) who all work together to provide you with the care you need, when you need it.   Your next appointment:   12 month(s)  The format for your next appointment:   In Person  Provider:   Mihai Croitoru, MD  Other Instructions   

## 2019-11-07 NOTE — Progress Notes (Signed)
Virtual Visit via Telephone Note   This visit type was conducted due to national recommendations for restrictions regarding the COVID-19 Pandemic (e.g. social distancing) in an effort to limit this patient's exposure and mitigate transmission in our community.  Due to her co-morbid illnesses, this patient is at least at moderate risk for complications without adequate follow up.  This format is felt to be most appropriate for this patient at this time.  The patient did not have access to video technology/had technical difficulties with video requiring transitioning to audio format only (telephone).  All issues noted in this document were discussed and addressed.  No physical exam could be performed with this format.  Please refer to the patient's chart for her  consent to telehealth for [facility].   Date:  11/07/2019   ID:  Sherri Pratt, DOB [birth year], MRN [id number]  Patient Location: Home Provider Location: Office  PCP:  [name], NP  Cardiologist:  Thurmon Fair, MD  Electrophysiologist:  None   Evaluation Performed:  Follow-Up Visit  Chief Complaint: Follow-up for atrial fibrillation  History of Present Illness:    Sherri Pratt is a 74 y.o. female last encounter with Dr. Royann Shivers November 12, 2018.  History of atrial fibrillation initially found during hospitalization for acute renal insufficiency secondary to hypovolemia in 2018.  Also noted on the previous 30-day event monitor.  Hospitalized in October 12, 2018.  She was severely hypoglycemic with complaint of single syncopal episode.  Blood sugar was found to be 26.  In route to hospital via EMS she was reported to have wide-complex tachycardia 180 to 200 bpm.  Later discovered to be in atrial fibrillation with controlled rate.   She was diagnosed with Parkinson's disease during that hospital visit.  She has had a recent event monitor which showed episodes of wide-complex tachycardia, atrial fibrillation, wide-complex  beat.  Subsequent cardiac telemetry monitoring report shown below below.  Showed episodes of paroxysmal atrial fibrillation, atrial flutter,  Other medical problems include anemia, CKD, diabetes, gout, hyperlipidemia, hypertension.  The patient does not have symptoms concerning for COVID-19 infection (fever, chills, cough, or new shortness of breath).    Past Medical History:  Diagnosis Date  . Anemia   . Chronic kidney disease   . Diabetes mellitus   . Gout   . Hyperlipidemia   . Hypertension    Past Surgical History:  Procedure Laterality Date  . APPENDECTOMY       No outpatient medications have been marked as taking for the 11/07/19 encounter (Telemedicine) with Netta Neat., NP.     Allergies:   Peanut-containing drug products   Social History   Tobacco Use  . Smoking status: Never Smoker  . Smokeless tobacco: Never Used  Substance Use Topics  . Alcohol use: No  . Drug use: No     Family Hx: The patient's family history includes Cancer in her brother, father, and mother; Healthy in her child and sister; Hypertension in her mother.  ROS:   Please see the history of present illness.    All other systems reviewed and are negative.   Prior CV studies:   The following studies were reviewed today:  Echocardiogram April 16, 2017 Study Conclusions  - Left ventricle: The cavity size was normal. There was mild   concentric hypertrophy. Systolic function was normal. The   estimated ejection fraction was in the range of 55% to 60%. Wall   motion was normal; there were no regional  wall motion   abnormalities. Left ventricular diastolic function parameters   were normal. - Aortic valve: Valve area (VTI): 1.76 cm^2. Valve area (Vmax):   1.75 cm^2. Valve area (Vmean): 1.62 cm^2. - Left atrium: The atrium was mildly dilated. - Pulmonary arteries: PA peak pressure: 32 mm Hg (S).   Event monitor May 11, 2017 Narrative Summary The patient was monitored for 717:43  hours, of which 469:24 hours were usable. Average heart rate for the monitored period was 58 BPM. Tachycardia was present for 1% of the readable data; Bradycardia was present for 63% of the readable data. Atrial Fibrillation was noted during 1% of the readable data. The fastest AF episode was 188 BPM; the longest AF episode lasted for 01:03:28. No Pause(s) noted of 3 seconds or longer. 1 manually-triggered recording(s) posted with symptoms including Other. Immediate notification was made for 2 episode(s). Preliminary Findings are provided by a Certified Cardiac Technician  Background rhythm is mild-moderate sinus bradycardia.  There are several episodes of atrial fibrillation with rapid ventricular response, lasting up to an hour in duration. There is often evidence of aberrant conduction with wide complex beats.  In addition there are episodes of regular wide-complex tachycardia, most likely representing atrial flutter with 2:1 AV block or paroxysmal atrial tachycardia with 1:1 AV conduction and aberrancy  Frequent isolated PACs and PVCs and occasional ventricular couplets are also seen   Abnormal 30 day event monitor with the presence of recurrent episodes of atrial fibrillation with rapid ventricular response. In addition there are short episodes of regular wide complex tachycardia, most likely representing supraventricular arrhythmia with aberrancy. Background rhythm is sinus bradycardia, which precludes the use of conventional rate control medications. Oral anticoagulation should be continued.   Labs/Other Tests and Data Reviewed:    EKG:  An ECG dated October 12, 2018 was personally reviewed today and demonstrated:  Atrial fibrillation rate of 65, nonspecific intraventricular conduction delay, nonspecific T wave abnormality  Recent Labs: 11/10/2018: BUN 11; Creatinine, Ser 1.07; Hemoglobin 12.3; Magnesium 1.8; Platelets 206.0; Potassium 3.8; Sodium 142   Recent Lipid Panel Lab  Results  Component Value Date/Time   CHOL 150 12/01/2017 09:11 AM   TRIG 153.0 (H) 12/01/2017 09:11 AM   TRIG 184 (H) 09/04/2006 08:38 AM   HDL 44.70 12/01/2017 09:11 AM   CHOLHDL 3 12/01/2017 09:11 AM   LDLCALC 75 12/01/2017 09:11 AM    Wt Readings from Last 3 Encounters:  10/26/19 215 lb (97.5 kg)  08/18/19 215 lb (97.5 kg)  03/11/19 182 lb (82.6 kg)     Objective:    Vital Signs:  There were no vitals taken for this visit.   Speech is appropriate with a normal speech pattern.  No evidence of cough, wheezing, or dyspnea noted.  ASSESSMENT & PLAN:    1. Paroxysmal atrial fibrillation (HCC) Patient denies any recent palpitations or arrhythmias, fluttering heart, racing heart, or skipped beats.  Continue metoprolol 12.5 mg p.o. twice daily.  Continue Eliquis 5 mg p.o. twice daily.  2. Essential hypertension There were no vital signs available for today's visit.  Last visit with Dr. Royann Shivers blood pressure was 142/82.  3. Diabetes mellitus type 2 in obese [facility]) Patient states recent hemoglobin A1c was 6.1% which is down from 6.5% on previous lab work.  She follows with PCP for management.  4. Mixed hyperlipidemia Last lipid panel 1 year ago showed a total cholesterol of 150, triglycerides 153, HDL 44.7, VLDL 30.6, LDL 75.  Patient states she had recent  lab work with PCP.  We do not have those records.  COVID-19 Education: The signs and symptoms of COVID-19 were discussed with the patient and how to seek care for testing (follow up with PCP or arrange E-visit).  The importance of social distancing was discussed today.  Time:   Today, I have spent 15 minutes with the patient with telehealth technology discussing the above problems.     Medication Adjustments/Labs and Tests Ordered: Current medicines are reviewed at length with the patient today.  Concerns regarding medicines are outlined above.   Tests Ordered: No orders of the defined types were placed in this  encounter.   Medication Changes: No orders of the defined types were placed in this encounter.   Follow Up:  Either In Person or Virtual in 1 year(s)  Signed, Verta Ellen, NP  11/07/2019 9:37 AM    [place]

## 2019-12-11 ENCOUNTER — Ambulatory Visit: Payer: Medicare Other | Attending: Internal Medicine

## 2019-12-11 DIAGNOSIS — Z23 Encounter for immunization: Secondary | ICD-10-CM | POA: Insufficient documentation

## 2019-12-11 NOTE — Progress Notes (Signed)
   Covid-19 Vaccination Clinic  Name:  Sherri Pratt    MRN: [id number] DOB: [birth year]  12/11/2019  Ms. Wild was observed post Covid-19 immunization for 15 minutes without incidence. She was provided with Vaccine Information Sheet and instruction to access the V-Safe system.   [name] was instructed to call 911 with any severe reactions post vaccine: [name] Difficulty breathing  . Swelling of your face and throat  . A fast heartbeat  . A bad rash all over your body  . Dizziness and weakness    Immunizations Administered    Name Date Dose VIS Date Route   Pfizer COVID-19 Vaccine 12/11/2019 10:25 AM 0.3 mL 09/23/2019 Intramuscular   Manufacturer: [employer], [employer]   Lot: [id number]   NDC: [address]-1

## 2019-12-15 ENCOUNTER — Telehealth: Payer: Self-pay | Admitting: Adult Health

## 2019-12-15 NOTE — Telephone Encounter (Signed)
The patient called in wanting to schedule a physical for her and her husband Sherri Pratt. Sherri Pratt is not established with a PCP he was seeing Dr. Tawanna Cooler. Can we schedule a establish appointment with Sherri Pratt?  Sherri Pratt MRN [id number]   Please advise

## 2019-12-15 NOTE — Telephone Encounter (Signed)
Francis Dowse Oftedahl is scheduled for his TOC on 02/24/2020 at 9:30 AM

## 2019-12-15 NOTE — Telephone Encounter (Signed)
He can establish care with me but I will not be able to do his physical on the same day as his establish care visit

## 2020-01-04 ENCOUNTER — Ambulatory Visit: Payer: Medicare Other | Attending: Internal Medicine

## 2020-01-04 DIAGNOSIS — Z23 Encounter for immunization: Secondary | ICD-10-CM

## 2020-01-04 NOTE — Progress Notes (Signed)
   Covid-19 Vaccination Clinic  Name:  Sherri Pratt    MRN: [id number] DOB: 1946/07/02  01/04/2020  Ms. Sherri Pratt was observed post Covid-19 immunization for 15 minutes without incident. She was provided with Vaccine Information Sheet and instruction to access the V-Safe system.   Ms. Sherri Pratt was instructed to call 911 with any severe reactions post vaccine: [name] Difficulty breathing  . Swelling of face and throat  . A fast heartbeat  . A bad rash all over body  . Dizziness and weakness   Immunizations Administered    Name Date Dose VIS Date Route   Pfizer COVID-19 Vaccine 01/04/2020  4:32 PM 0.3 mL 09/23/2019 Intramuscular   Manufacturer: [employer], [employer]   Lot: [id number]   NDC: [address]-0

## 2020-01-17 NOTE — Progress Notes (Deleted)
    Assessment/Plan:   1.  Parkinsons Disease  -***Continue carbidopa/levodopa 25/100, 2/2/1  -***labs today including chem, tsh  2.  History of syncopal episode and subsequent small falcine hemorrhage  -This was unrelated to her Parkinson's disease.  This was related to wide-complex tachycardia.  She is on Eliquis, which needs to be monitored closely.   3.  History of paroxysmal atrial fibrillation  -On low-dose metoprolol.  -On Eliquis.  -Followed by cardiology  Subjective:   Sherri Pratt was seen today in follow up for Parkinsons disease.  My previous records were reviewed prior to todays visit as well as outside records available to me.  Her levodopa was increased last visit.  She reports that ***.  Pt denies falls.  Pt denies lightheadedness, near syncope.  No hallucinations.  Mood has been good.  Reviewed cardiology medical records.  Was last seen by nurse practitioner on January 25, although this was just a telemedicine visit.  Labs have been reviewed.  With the exception of her A1c, she has not had lab work since January, 2020.  Current prescribed movement disorder medications: ***Carbidopa/levodopa 25/100, 2/2/1 (prior 1 tablet 3 times per day)   PREVIOUS MEDICATIONS: {Parkinson's RX:18200}  ALLERGIES:   Allergies  Allergen Reactions  . Peanut-Containing Drug Products     CURRENT MEDICATIONS:  Outpatient Encounter Medications as of 01/19/2020  Medication Sig  . allopurinol (ZYLOPRIM) 300 MG tablet TAKE 1 TABLET BY MOUTH ONCE DAILY  . carbidopa-levodopa (SINEMET IR) 25-100 MG tablet 2 in the 8AM, 2 at noon, 1 at 4pm  . ELIQUIS 5 MG TABS tablet TAKE 1 TABLET BY MOUTH TWICE A DAY  . glipiZIDE (GLUCOTROL XL) 2.5 MG 24 hr tablet TAKE 1 TABLET BY MOUTH ONCE A DAY WITH BREAKFAST.  . metoprolol tartrate (LOPRESSOR) 25 MG tablet TAKE 1/2 TABLET BY MOUTH TWICE DAILY  . simvastatin (ZOCOR) 20 MG tablet TAKE 1 TABLET BY MOUTH EVERY NIGHT AT BEDTIME   No facility-administered  encounter medications on file as of 01/19/2020.    Objective:   PHYSICAL EXAMINATION:    VITALS:  There were no vitals filed for this visit.  GEN:  The patient appears stated age and is in NAD. HEENT:  Normocephalic, atraumatic.  The mucous membranes are moist. The superficial temporal arteries are without ropiness or tenderness. CV:  RRR Lungs:  CTAB Neck/HEME:  There are no carotid bruits bilaterally.  Neurological examination:  Orientation: The patient is alert and oriented x3. Cranial nerves: There is good facial symmetry with*** facial hypomimia. The speech is fluent and clear. Soft palate rises symmetrically and there is no tongue deviation. Hearing is intact to conversational tone. Sensation: Sensation is intact to light touch throughout Motor: Strength is at least antigravity x4.  Movement examination: Tone: There is ***tone in the *** Abnormal movements: *** Coordination:  There is *** decremation with RAM's, *** Gait and Station: The patient has *** difficulty arising out of a deep-seated chair without the use of the hands. The patient's stride length is ***.  The patient has a *** pull test.      Total time spent on today's visit was ***30 minutes, including both face-to-face time and nonface-to-face time.  Time included that spent on review of records (prior notes available to me/labs/imaging if pertinent), discussing treatment and goals, answering patient's questions and coordinating care.  Cc:  [name], NP

## 2020-01-19 ENCOUNTER — Ambulatory Visit: Payer: Medicare Other | Admitting: Neurology

## 2020-02-24 ENCOUNTER — Encounter: Payer: Self-pay | Admitting: Adult Health

## 2020-02-24 ENCOUNTER — Other Ambulatory Visit: Payer: Self-pay

## 2020-02-24 ENCOUNTER — Ambulatory Visit (INDEPENDENT_AMBULATORY_CARE_PROVIDER_SITE_OTHER): Payer: Medicare Other | Admitting: Adult Health

## 2020-02-24 VITALS — BP 140/80 | Temp 97.9°F | Ht 65.0 in | Wt 196.0 lb

## 2020-02-24 DIAGNOSIS — I480 Paroxysmal atrial fibrillation: Secondary | ICD-10-CM | POA: Diagnosis not present

## 2020-02-24 DIAGNOSIS — G20 Parkinson's disease: Secondary | ICD-10-CM

## 2020-02-24 DIAGNOSIS — Z0000 Encounter for general adult medical examination without abnormal findings: Secondary | ICD-10-CM | POA: Diagnosis not present

## 2020-02-24 DIAGNOSIS — E11649 Type 2 diabetes mellitus with hypoglycemia without coma: Secondary | ICD-10-CM

## 2020-02-24 DIAGNOSIS — M10079 Idiopathic gout, unspecified ankle and foot: Secondary | ICD-10-CM

## 2020-02-24 DIAGNOSIS — E782 Mixed hyperlipidemia: Secondary | ICD-10-CM

## 2020-02-24 DIAGNOSIS — E041 Nontoxic single thyroid nodule: Secondary | ICD-10-CM

## 2020-02-24 LAB — LIPID PANEL
Cholesterol: 154 mg/dL (ref 0–200)
HDL: 51.9 mg/dL (ref >39.00)
LDL Cholesterol: 77 mg/dL (ref 0–99)
NonHDL: 101.87
Total CHOL/HDL Ratio: 3
Triglycerides: 126 mg/dL (ref 0.0–149.0)
VLDL: 25.2 mg/dL (ref 0.0–40.0)

## 2020-02-24 LAB — CBC WITH DIFFERENTIAL/PLATELET
Basophils Absolute: 0 10*3/uL (ref 0.0–0.1)
Basophils Relative: 0.6 % (ref 0.0–3.0)
Eosinophils Absolute: 0.3 10*3/uL (ref 0.0–0.7)
Eosinophils Relative: 3.1 % (ref 0.0–5.0)
HCT: 35.5 % — ABNORMAL LOW (ref 36.0–46.0)
Hemoglobin: 12 g/dL (ref 12.0–15.0)
Lymphocytes Relative: 20 % (ref 12.0–46.0)
Lymphs Abs: 1.6 10*3/uL (ref 0.7–4.0)
MCHC: 33.7 g/dL (ref 30.0–36.0)
MCV: 81.9 fl (ref 78.0–100.0)
Monocytes Absolute: 0.6 10*3/uL (ref 0.1–1.0)
Monocytes Relative: 7 % (ref 3.0–12.0)
Neutro Abs: 5.6 10*3/uL (ref 1.4–7.7)
Neutrophils Relative %: 69.3 % (ref 43.0–77.0)
Platelets: 182 10*3/uL (ref 150.0–400.0)
RBC: 4.34 Mil/uL (ref 3.87–5.11)
RDW: 16.4 % — ABNORMAL HIGH (ref 11.5–15.5)
WBC: 8.1 10*3/uL (ref 4.0–10.5)

## 2020-02-24 LAB — COMPREHENSIVE METABOLIC PANEL
ALT: 7 U/L (ref 0–35)
AST: 15 U/L (ref 0–37)
Albumin: 4 g/dL (ref 3.5–5.2)
Alkaline Phosphatase: 78 U/L (ref 39–117)
BUN: 43 mg/dL — ABNORMAL HIGH (ref 6–23)
CO2: 29 mEq/L (ref 19–32)
Calcium: 9.5 mg/dL (ref 8.4–10.5)
Chloride: 104 mEq/L (ref 96–112)
Creatinine, Ser: 1.1 mg/dL (ref 0.40–1.20)
GFR: 48.39 mL/min — ABNORMAL LOW (ref >60.00)
Glucose, Bld: 121 mg/dL — ABNORMAL HIGH (ref 70–99)
Potassium: 4.4 mEq/L (ref 3.5–5.1)
Sodium: 138 mEq/L (ref 135–145)
Total Bilirubin: 0.4 mg/dL (ref 0.2–1.2)
Total Protein: 6.6 g/dL (ref 6.0–8.3)

## 2020-02-24 LAB — HEMOGLOBIN A1C: Hgb A1c MFr Bld: 6.7 % — ABNORMAL HIGH (ref 4.6–6.5)

## 2020-02-24 LAB — TSH: TSH: 1.33 u[IU]/mL (ref 0.35–4.50)

## 2020-02-24 MED ORDER — ALLOPURINOL 300 MG PO TABS: 300.0000 mg | ORAL_TABLET | Freq: Every day | ORAL | 3 refills | Status: AC

## 2020-02-24 MED ORDER — GLIPIZIDE ER 2.5 MG PO TB24: 2.5000 mg | ORAL_TABLET | Freq: Every day | ORAL | 1 refills | Status: DC

## 2020-02-24 MED ORDER — SIMVASTATIN 20 MG PO TABS: 20.0000 mg | ORAL_TABLET | Freq: Every day | ORAL | 3 refills | Status: AC

## 2020-02-24 NOTE — Progress Notes (Signed)
Subjective:    Patient ID: Sherri Pratt, female    DOB: Dec 19, 1945, 74 y.o.   MRN: [id number]  HPI Patient presents for yearly preventative medicine examination. He is a pleasant 74 year old female who  has a past medical history of Anemia, Chronic kidney disease, Diabetes mellitus, Gout, Hyperlipidemia, and Hypertension.  DM -she is currently maintained on glipizide 2.5 mg extended release.  She does monitor her BS at home and reports readings below 120, she denies hypoglycemic episodes.  Lab Results  Component Value Date   HGBA1C 6.1 10/25/2019   Parkinsonism -is followed by Dr. Carles Collet.  She suspects idiopathic Parkinson's disease, but cannot rule out an atypical state.  She is currently on carbidopa/levodopa 25/100, 2 tabs in the morning, 2 tabs in the afternoon, and 1 tab in the evening. She does feel as though her LUE tremor has become worse and walking is becoming more difficult. She denies falls.  She plans to follow up with Neurology   Hyperlipidemia - well controlled with Zocor 20 mg  Lab Results  Component Value Date   CHOL 150 12/01/2017   HDL 44.70 12/01/2017   LDLCALC 75 12/01/2017   TRIG 153.0 (H) 12/01/2017   CHOLHDL 3 12/01/2017   H/o Atrial Fibrillation -initially found during hospitalization for acute renal insufficiency secondary to hypovolemia in 2018.  Atrial fibrillation was also noted on previous 30-day event monitor.  She was hospitalized in October 12, 2018.  She was severely hypoglycemic with complaint of single syncopal episode.  Her blood sugar was found to be 26.  In route to the hospital via EMS she was reported to have a wide-complex tachycardia 180 to 200 bpm.  Later discovered to be in atrial fibrillation with controlled rate.  She has also had a recent event monitor which showed episodes of wide-complex tachycardia, atrial fibrillation, wide-complex beat.  She is currently on metoprolol 12.5 mg twice daily and Eliquis 5 mg twice daily.  She denies recent  palpitations or feelings of arrhythmia such as heart fluttering, racing heart, or skipping beats  Thyroid Nodule -in December 2019 when she had a thyroid nodule noted on recent CT.  Her ultrasound in February 2020 showed multiple thyroid nodules.  Nodule #1 nodule #2 in the right thyroid lobe both meet criteria for 1 year follow-up  Gout - takes allopurinol 300 mg daily. She denies recent gout flares  All immunizations and health maintenance protocols were reviewed with the patient and needed orders were placed. She is up to date on vaccinations   Appropriate screening laboratory values were ordered for the patient including screening of hyperlipidemia, renal function and hepatic function.  Medication reconciliation,  past medical history, social history, problem list and allergies were reviewed in detail with the patient  Goals were established with regard to weight loss, exercise, and  diet in compliance with medications. She does have a good appetite and is using ensure as a meal supplement  Wt Readings from Last 3 Encounters:  02/24/20 196 lb (88.9 kg)  10/26/19 215 lb (97.5 kg)  08/18/19 215 lb (97.5 kg)     End of life planning was discussed.  She refuses colonoscopy but will do cologuard   Review of Systems  Constitutional: Negative.   HENT: Negative.   Eyes: Negative.   Respiratory: Negative.   Cardiovascular: Negative.   Gastrointestinal: Negative.   Endocrine: Negative.   Genitourinary: Negative.   Musculoskeletal: Negative.   Skin: Negative.   Allergic/Immunologic: Negative.   Neurological:  Negative.   Hematological: Negative.   Psychiatric/Behavioral: Negative.    Past Medical History:  Diagnosis Date  . Anemia   . Chronic kidney disease   . Diabetes mellitus   . Gout   . Hyperlipidemia   . Hypertension     Social History   Socioeconomic History  . Marital status: Married    Spouse name: Not on file  . Number of children: Not on file  . Years of  education: Not on file  . Highest education level: Not on file  Occupational History  . Not on file  Tobacco Use  . Smoking status: Never Smoker  . Smokeless tobacco: Never Used  Substance and Sexual Activity  . Alcohol use: No  . Drug use: No  . Sexual activity: Not on file  Other Topics Concern  . Not on file  Social History Narrative   Retired - She Forensic psychologist   Married for 51 years    Three children - all in [place]      She likes to quilting and shopping.    Social Determinants of Health   Financial Resource Strain:   . Difficulty of Paying Living Expenses:   Food Insecurity:   . Worried About Programme researcher, broadcasting/film/video in the Last Year:   . Barista in the Last Year:   Transportation Needs:   . Freight forwarder (Medical):   [name] Lack of Transportation (Non-Medical):   Physical Activity:   . Days of Exercise per Week:   . Minutes of Exercise per Session:   Stress:   . Feeling of Stress :   Social Connections:   . Frequency of Communication with Friends and Family:   . Frequency of Social Gatherings with Friends and Family:   . Attends Religious Services:   . Active Member of Clubs or Organizations:   . Attends Banker Meetings:   [name] Marital Status:   Intimate Partner Violence:   . Fear of Current or Ex-Partner:   . Emotionally Abused:   [name] Physically Abused:   . Sexually Abused:     Past Surgical History:  Procedure Laterality Date  . APPENDECTOMY      Family History  Problem Relation Age of Onset  . Cancer Mother        colon  . Hypertension Mother   . Cancer Father        colon  . Healthy Sister   . Cancer Brother   . Healthy Child     Allergies  Allergen Reactions  . Peanut-Containing Drug Products     Current Outpatient Medications on File Prior to Visit  Medication Sig Dispense Refill  . allopurinol (ZYLOPRIM) 300 MG tablet TAKE 1 TABLET BY MOUTH ONCE DAILY 90 tablet 0  . carbidopa-levodopa (SINEMET IR) 25-100 MG tablet  2 in the 8AM, 2 at noon, 1 at 4pm 450 tablet 1  . ELIQUIS 5 MG TABS tablet TAKE 1 TABLET BY MOUTH TWICE A DAY 60 tablet 6  . glipiZIDE (GLUCOTROL XL) 2.5 MG 24 hr tablet TAKE 1 TABLET BY MOUTH ONCE A DAY WITH BREAKFAST. 90 tablet 0  . metoprolol tartrate (LOPRESSOR) 25 MG tablet TAKE 1/2 TABLET BY MOUTH TWICE DAILY 90 tablet 1  . simvastatin (ZOCOR) 20 MG tablet TAKE 1 TABLET BY MOUTH EVERY NIGHT AT BEDTIME 90 tablet 0   No current facility-administered medications on file prior to visit.    BP 140/80   Temp 97.9 F (36.6 C)  Ht 5\' 5"  (1.651 m)   Wt 196 lb (88.9 kg)   BMI 32.62 kg/m        Objective:   Physical Exam Vitals and nursing note reviewed.  Constitutional:      General: She is not in acute distress.    Appearance: Normal appearance. She is well-developed. She is not ill-appearing.  HENT:     Head: Normocephalic and atraumatic.     Right Ear: Tympanic membrane, ear canal and external ear normal. There is no impacted cerumen.     Left Ear: Tympanic membrane, ear canal and external ear normal. There is no impacted cerumen.     Nose: Nose normal. No congestion or rhinorrhea.     Mouth/Throat:     Mouth: Mucous membranes are moist.     Dentition: Has dentures.     Pharynx: Oropharynx is clear. No oropharyngeal exudate or posterior oropharyngeal erythema.  Eyes:     General:        Right eye: No discharge.        Left eye: No discharge.     Extraocular Movements: Extraocular movements intact.     Conjunctiva/sclera: Conjunctivae normal.     Pupils: Pupils are equal, round, and reactive to light.  Neck:     Thyroid: No thyromegaly.     Vascular: No carotid bruit.     Trachea: No tracheal deviation.  Cardiovascular:     Rate and Rhythm: Normal rate and regular rhythm.     Pulses: Normal pulses.     Heart sounds: Normal heart sounds. No murmur. No friction rub. No gallop.   Pulmonary:     Effort: Pulmonary effort is normal. No respiratory distress.     Breath  sounds: Normal breath sounds. No stridor. No wheezing, rhonchi or rales.  Chest:     Chest wall: No tenderness.  Abdominal:     General: Abdomen is flat. Bowel sounds are normal. There is no distension.     Palpations: Abdomen is soft. There is no mass.     Tenderness: There is no abdominal tenderness. There is no right CVA tenderness, left CVA tenderness, guarding or rebound.     Hernia: No hernia is present.  Musculoskeletal:        General: No swelling, tenderness, deformity or signs of injury. Normal range of motion.     Cervical back: Normal range of motion and neck supple.     Right lower leg: No edema.     Left lower leg: No edema.  Lymphadenopathy:     Cervical: No cervical adenopathy.  Skin:    General: Skin is warm and dry.     Coloration: Skin is not jaundiced or pale.     Findings: No bruising, erythema, lesion or rash.  Neurological:     General: No focal deficit present.     Mental Status: She is alert and oriented to person, place, and time.     Cranial Nerves: No cranial nerve deficit.     Sensory: No sensory deficit.     Motor: No weakness.     Coordination: Coordination normal.     Gait: Gait normal.     Deep Tendon Reflexes: Reflexes normal.     Comments: LUE resting tremor  Psychiatric:        Mood and Affect: Mood normal.        Behavior: Behavior normal.        Thought Content: Thought content normal.  Judgment: Judgment normal.       Assessment & Plan:  1. Routine general medical examination at a health care facility - Follow up in one year or sooner if needed - CBC with Differential/Platelet - Comprehensive metabolic panel - Hemoglobin A1c - Lipid panel - TSH  2. Type 2 diabetes mellitus with hypoglycemia without coma, without long-term current use of insulin (HCC) - Continue Glipizide  - Likely follow up in 6 months  - CBC with Differential/Platelet - Comprehensive metabolic panel - Hemoglobin A1c - Lipid panel - TSH - glipiZIDE  (GLUCOTROL XL) 2.5 MG 24 hr tablet; Take 1 tablet (2.5 mg total) by mouth daily with breakfast.  Dispense: 90 tablet; Refill: 1  3. Paroxysmal atrial fibrillation (HCC) - Continue Eliquis and Metoprolol per Cardiology recommendations  - CBC with Differential/Platelet - Comprehensive metabolic panel - Hemoglobin A1c - Lipid panel - TSH  4. Parkinson's disease (HCC) - Follow up with Neurology as directed - CBC with Differential/Platelet - Comprehensive metabolic panel - Hemoglobin A1c - Lipid panel - TSH - US THYROID; Future  5. Thyroid nodule  - TSH - US THYROID; Future  6. Mixed hyperlipidemia  - CBC with Differential/Platelet - Comprehensive metabolic panel - Hemoglobin A1c - Lipid panel - TSH - simvastatin (ZOCOR) 20 MG tablet; Take 1 tablet (20 mg total) by mouth at bedtime.  Dispense: 90 tablet; Refill: 3  7. Acute idiopathic gout of ankle, unspecified laterality  - allopurinol (ZYLOPRIM) 300 MG tablet; Take 1 tablet (300 mg total) by mouth daily.  Dispense: 90 tablet; Refill: 3   [name], NP

## 2020-02-24 NOTE — Patient Instructions (Signed)
It was great seeing you today   We will follow up with you regarding your blood work   I have ordered a repeat ultrasound of your thyroid - they will call you to schedule this   We have also ordered cologuard for colon cancer screening. This will show up at your house

## 2020-04-12 ENCOUNTER — Telehealth: Payer: Self-pay | Admitting: Neurology

## 2020-04-12 NOTE — Telephone Encounter (Signed)
Spoke with pt to verify how she is taking her carbidopa/levodopa, she states she's taking 2 tablets twice a day. Reviewed the inst Dr Tat gave her on her last visit which is as follows -   Take carbidopa/levodopa 25/100, 2 tablets at 8:30 am, 2 tablets at 12:30 pm, 1 tablet at 4:30 pm.  Take this medication on an empty stomach or take it with carbohydrate.  Keep this medication away from protein by about 30 min.  She verbalized understanding. Told her that a refill will be sent to the pharmacy and that someone would call her to schedule her f/u appointment.

## 2020-04-13 NOTE — Telephone Encounter (Signed)
Called to make a follow up appt for patient and there was no answer and no VM will try back

## 2020-04-18 ENCOUNTER — Emergency Department (HOSPITAL_COMMUNITY): Payer: Medicare Other

## 2020-04-18 ENCOUNTER — Other Ambulatory Visit: Payer: Self-pay

## 2020-04-18 ENCOUNTER — Inpatient Hospital Stay (HOSPITAL_COMMUNITY)
Admission: EM | Admit: 2020-04-18 | Discharge: 2020-04-24 | DRG: 871 | Disposition: A | Discharge status: Skilled Nursing Facility | Payer: Medicare Other | Attending: Internal Medicine | Admitting: Internal Medicine

## 2020-04-18 ENCOUNTER — Encounter (HOSPITAL_COMMUNITY): Payer: Self-pay

## 2020-04-18 DIAGNOSIS — R531 Weakness: Secondary | ICD-10-CM

## 2020-04-18 DIAGNOSIS — R509 Fever, unspecified: Secondary | ICD-10-CM

## 2020-04-18 DIAGNOSIS — R6520 Severe sepsis without septic shock: Secondary | ICD-10-CM | POA: Diagnosis present

## 2020-04-18 DIAGNOSIS — E782 Mixed hyperlipidemia: Secondary | ICD-10-CM | POA: Diagnosis present

## 2020-04-18 DIAGNOSIS — I480 Paroxysmal atrial fibrillation: Secondary | ICD-10-CM | POA: Diagnosis present

## 2020-04-18 DIAGNOSIS — A419 Sepsis, unspecified organism: Secondary | ICD-10-CM | POA: Diagnosis not present

## 2020-04-18 DIAGNOSIS — L89122 Pressure ulcer of left upper back, stage 2: Secondary | ICD-10-CM | POA: Diagnosis present

## 2020-04-18 DIAGNOSIS — J181 Lobar pneumonia, unspecified organism: Secondary | ICD-10-CM | POA: Diagnosis not present

## 2020-04-18 DIAGNOSIS — E1122 Type 2 diabetes mellitus with diabetic chronic kidney disease: Secondary | ICD-10-CM | POA: Diagnosis present

## 2020-04-18 DIAGNOSIS — R627 Adult failure to thrive: Secondary | ICD-10-CM | POA: Diagnosis not present

## 2020-04-18 DIAGNOSIS — Z79899 Other long term (current) drug therapy: Secondary | ICD-10-CM

## 2020-04-18 DIAGNOSIS — E1165 Type 2 diabetes mellitus with hyperglycemia: Secondary | ICD-10-CM | POA: Diagnosis not present

## 2020-04-18 DIAGNOSIS — J90 Pleural effusion, not elsewhere classified: Secondary | ICD-10-CM | POA: Diagnosis not present

## 2020-04-18 DIAGNOSIS — N179 Acute kidney failure, unspecified: Secondary | ICD-10-CM | POA: Diagnosis present

## 2020-04-18 DIAGNOSIS — Z7901 Long term (current) use of anticoagulants: Secondary | ICD-10-CM

## 2020-04-18 DIAGNOSIS — F0280 Dementia in other diseases classified elsewhere without behavioral disturbance: Secondary | ICD-10-CM | POA: Diagnosis not present

## 2020-04-18 DIAGNOSIS — G20 Parkinson's disease: Secondary | ICD-10-CM | POA: Diagnosis not present

## 2020-04-18 DIAGNOSIS — Z7984 Long term (current) use of oral hypoglycemic drugs: Secondary | ICD-10-CM

## 2020-04-18 DIAGNOSIS — Z8249 Family history of ischemic heart disease and other diseases of the circulatory system: Secondary | ICD-10-CM

## 2020-04-18 DIAGNOSIS — I1310 Hypertensive heart and chronic kidney disease without heart failure, with stage 1 through stage 4 chronic kidney disease, or unspecified chronic kidney disease: Secondary | ICD-10-CM | POA: Diagnosis not present

## 2020-04-18 DIAGNOSIS — R9431 Abnormal electrocardiogram [ECG] [EKG]: Secondary | ICD-10-CM | POA: Diagnosis present

## 2020-04-18 DIAGNOSIS — I10 Essential (primary) hypertension: Secondary | ICD-10-CM | POA: Diagnosis not present

## 2020-04-18 DIAGNOSIS — G9341 Metabolic encephalopathy: Secondary | ICD-10-CM | POA: Diagnosis not present

## 2020-04-18 DIAGNOSIS — E870 Hyperosmolality and hypernatremia: Secondary | ICD-10-CM | POA: Diagnosis present

## 2020-04-18 DIAGNOSIS — L89312 Pressure ulcer of right buttock, stage 2: Secondary | ICD-10-CM | POA: Diagnosis present

## 2020-04-18 DIAGNOSIS — L89212 Pressure ulcer of right hip, stage 2: Secondary | ICD-10-CM | POA: Diagnosis present

## 2020-04-18 DIAGNOSIS — Z20822 Contact with and (suspected) exposure to covid-19: Secondary | ICD-10-CM | POA: Diagnosis present

## 2020-04-18 DIAGNOSIS — I959 Hypotension, unspecified: Secondary | ICD-10-CM | POA: Diagnosis not present

## 2020-04-18 DIAGNOSIS — Z91010 Allergy to peanuts: Secondary | ICD-10-CM

## 2020-04-18 DIAGNOSIS — E785 Hyperlipidemia, unspecified: Secondary | ICD-10-CM | POA: Diagnosis present

## 2020-04-18 DIAGNOSIS — R0902 Hypoxemia: Secondary | ICD-10-CM | POA: Diagnosis not present

## 2020-04-18 DIAGNOSIS — E119 Type 2 diabetes mellitus without complications: Secondary | ICD-10-CM | POA: Diagnosis not present

## 2020-04-18 DIAGNOSIS — Z6834 Body mass index (BMI) 34.0-34.9, adult: Secondary | ICD-10-CM

## 2020-04-18 DIAGNOSIS — E860 Dehydration: Secondary | ICD-10-CM | POA: Diagnosis present

## 2020-04-18 DIAGNOSIS — J189 Pneumonia, unspecified organism: Secondary | ICD-10-CM | POA: Diagnosis not present

## 2020-04-18 DIAGNOSIS — L2489 Irritant contact dermatitis due to other agents: Secondary | ICD-10-CM | POA: Diagnosis not present

## 2020-04-18 DIAGNOSIS — L8990 Pressure ulcer of unspecified site, unspecified stage: Secondary | ICD-10-CM | POA: Insufficient documentation

## 2020-04-18 DIAGNOSIS — Z800 Family history of malignant neoplasm of digestive organs: Secondary | ICD-10-CM

## 2020-04-18 DIAGNOSIS — L89112 Pressure ulcer of right upper back, stage 2: Secondary | ICD-10-CM | POA: Diagnosis present

## 2020-04-18 DIAGNOSIS — N1832 Chronic kidney disease, stage 3b: Secondary | ICD-10-CM | POA: Diagnosis not present

## 2020-04-18 DIAGNOSIS — Z809 Family history of malignant neoplasm, unspecified: Secondary | ICD-10-CM

## 2020-04-18 DIAGNOSIS — R4182 Altered mental status, unspecified: Secondary | ICD-10-CM | POA: Diagnosis not present

## 2020-04-18 DIAGNOSIS — M109 Gout, unspecified: Secondary | ICD-10-CM | POA: Diagnosis not present

## 2020-04-18 DIAGNOSIS — N183 Chronic kidney disease, stage 3 unspecified: Secondary | ICD-10-CM | POA: Diagnosis not present

## 2020-04-18 HISTORY — DX: Parkinson's disease: G20

## 2020-04-18 HISTORY — DX: Parkinson's disease without dyskinesia, without mention of fluctuations: G20.A1

## 2020-04-18 HISTORY — DX: Unspecified atrial fibrillation: I48.91

## 2020-04-18 LAB — CBC WITH DIFFERENTIAL/PLATELET
Abs Immature Granulocytes: 0.06 10*3/uL (ref 0.00–0.07)
Basophils Absolute: 0 10*3/uL (ref 0.0–0.1)
Basophils Relative: 0 %
Eosinophils Absolute: 0 10*3/uL (ref 0.0–0.5)
Eosinophils Relative: 0 %
HCT: 38.3 % (ref 36.0–46.0)
Hemoglobin: 12.2 g/dL (ref 12.0–15.0)
Immature Granulocytes: 1 %
Lymphocytes Relative: 4 %
Lymphs Abs: 0.5 10*3/uL — ABNORMAL LOW (ref 0.7–4.0)
MCH: 27.4 pg (ref 26.0–34.0)
MCHC: 31.9 g/dL (ref 30.0–36.0)
MCV: 85.9 fL (ref 80.0–100.0)
Monocytes Absolute: 0.8 10*3/uL (ref 0.1–1.0)
Monocytes Relative: 6 %
Neutro Abs: 11.3 10*3/uL — ABNORMAL HIGH (ref 1.7–7.7)
Neutrophils Relative %: 89 %
Platelets: 222 10*3/uL (ref 150–400)
RBC: 4.46 MIL/uL (ref 3.87–5.11)
RDW: 14.4 % (ref 11.5–15.5)
WBC: 12.6 10*3/uL — ABNORMAL HIGH (ref 4.0–10.5)
nRBC: 0 % (ref 0.0–0.2)

## 2020-04-18 LAB — URINALYSIS, ROUTINE W REFLEX MICROSCOPIC
Bacteria, UA: NONE SEEN
Bilirubin Urine: NEGATIVE
Glucose, UA: 150 mg/dL — AB
Hgb urine dipstick: NEGATIVE
Ketones, ur: NEGATIVE mg/dL
Leukocytes,Ua: NEGATIVE
Nitrite: NEGATIVE
Protein, ur: 30 mg/dL — AB
Specific Gravity, Urine: 1.025 (ref 1.005–1.030)
pH: 5 (ref 5.0–8.0)

## 2020-04-18 LAB — COMPREHENSIVE METABOLIC PANEL
ALT: 20 U/L (ref 0–44)
AST: 28 U/L (ref 15–41)
Albumin: 3.5 g/dL (ref 3.5–5.0)
Alkaline Phosphatase: 65 U/L (ref 38–126)
Anion gap: 12 (ref 5–15)
BUN: 55 mg/dL — ABNORMAL HIGH (ref 8–23)
CO2: 20 mmol/L — ABNORMAL LOW (ref 22–32)
Calcium: 9 mg/dL (ref 8.9–10.3)
Chloride: 114 mmol/L — ABNORMAL HIGH (ref 98–111)
Creatinine, Ser: 1.42 mg/dL — ABNORMAL HIGH (ref 0.44–1.00)
GFR calc Af Amer: 42 mL/min — ABNORMAL LOW (ref >60)
GFR calc non Af Amer: 36 mL/min — ABNORMAL LOW (ref >60)
Glucose, Bld: 297 mg/dL — ABNORMAL HIGH (ref 70–99)
Potassium: 3.9 mmol/L (ref 3.5–5.1)
Sodium: 146 mmol/L — ABNORMAL HIGH (ref 135–145)
Total Bilirubin: 0.7 mg/dL (ref 0.3–1.2)
Total Protein: 7 g/dL (ref 6.5–8.1)

## 2020-04-18 LAB — LACTIC ACID, PLASMA: Lactic Acid, Venous: 1.6 mmol/L (ref 0.5–1.9)

## 2020-04-18 LAB — PROTIME-INR
INR: 1.3 — ABNORMAL HIGH (ref 0.8–1.2)
Prothrombin Time: 16 seconds — ABNORMAL HIGH (ref 11.4–15.2)

## 2020-04-18 LAB — SARS CORONAVIRUS 2 BY RT PCR (HOSPITAL ORDER, PERFORMED IN ~~LOC~~ HOSPITAL LAB): SARS Coronavirus 2: NEGATIVE

## 2020-04-18 LAB — LIPASE, BLOOD: Lipase: 26 U/L (ref 11–51)

## 2020-04-18 LAB — APTT: aPTT: 25 seconds (ref 24–36)

## 2020-04-18 MED ORDER — SODIUM CHLORIDE 0.9 % IV SOLN
2.0000 g | Freq: Two times a day (BID) | INTRAVENOUS | Status: DC
  Administered 2020-04-19: 2 g via INTRAVENOUS
  Filled 2020-04-18: qty 2

## 2020-04-18 MED ORDER — ACETAMINOPHEN 325 MG PO TABS
650.0000 mg | ORAL_TABLET | Freq: Four times a day (QID) | ORAL | Status: DC | PRN
  Administered 2020-04-21: 650 mg via ORAL
  Filled 2020-04-18: qty 2

## 2020-04-18 MED ORDER — INSULIN ASPART 100 UNIT/ML ~~LOC~~ SOLN
0.0000 [IU] | Freq: Three times a day (TID) | SUBCUTANEOUS | Status: DC
  Administered 2020-04-19: 1 [IU] via SUBCUTANEOUS
  Administered 2020-04-19 – 2020-04-20 (×2): 2 [IU] via SUBCUTANEOUS
  Administered 2020-04-20: 1 [IU] via SUBCUTANEOUS
  Administered 2020-04-21: 3 [IU] via SUBCUTANEOUS
  Administered 2020-04-21 – 2020-04-23 (×3): 2 [IU] via SUBCUTANEOUS
  Administered 2020-04-23: 1 [IU] via SUBCUTANEOUS
  Administered 2020-04-24: 3 [IU] via SUBCUTANEOUS

## 2020-04-18 MED ORDER — CARBIDOPA-LEVODOPA 25-100 MG PO TABS
1.0000 | ORAL_TABLET | Freq: Every day | ORAL | Status: DC
  Administered 2020-04-19 – 2020-04-23 (×5): 1 via ORAL
  Filled 2020-04-18 (×6): qty 1

## 2020-04-18 MED ORDER — SODIUM CHLORIDE 0.9 % IV SOLN: INTRAVENOUS | Status: DC

## 2020-04-18 MED ORDER — VANCOMYCIN HCL 1500 MG/300ML IV SOLN
1500.0000 mg | Freq: Once | INTRAVENOUS | Status: AC
  Administered 2020-04-18: 1500 mg via INTRAVENOUS
  Filled 2020-04-18: qty 300

## 2020-04-18 MED ORDER — VANCOMYCIN HCL IN DEXTROSE 1-5 GM/200ML-% IV SOLN: 1000.0000 mg | Freq: Once | INTRAVENOUS | Status: DC

## 2020-04-18 MED ORDER — METRONIDAZOLE IN NACL 5-0.79 MG/ML-% IV SOLN
500.0000 mg | Freq: Once | INTRAVENOUS | Status: AC
  Administered 2020-04-18: 500 mg via INTRAVENOUS
  Filled 2020-04-18: qty 100

## 2020-04-18 MED ORDER — LACTATED RINGERS IV SOLN: INTRAVENOUS | Status: DC

## 2020-04-18 MED ORDER — SODIUM CHLORIDE 0.9 % IV SOLN
2.0000 g | Freq: Once | INTRAVENOUS | Status: AC
  Administered 2020-04-18: 2 g via INTRAVENOUS
  Filled 2020-04-18: qty 2

## 2020-04-18 MED ORDER — CARBIDOPA-LEVODOPA 25-100 MG PO TABS: 1.0000 | ORAL_TABLET | ORAL | Status: DC

## 2020-04-18 MED ORDER — ACETAMINOPHEN 325 MG PO TABS
650.0000 mg | ORAL_TABLET | Freq: Once | ORAL | Status: AC
  Administered 2020-04-18: 650 mg via ORAL
  Filled 2020-04-18: qty 2

## 2020-04-18 MED ORDER — APIXABAN 5 MG PO TABS
5.0000 mg | ORAL_TABLET | Freq: Two times a day (BID) | ORAL | Status: DC
  Administered 2020-04-19 – 2020-04-24 (×11): 5 mg via ORAL
  Filled 2020-04-18 (×13): qty 1

## 2020-04-18 MED ORDER — VANCOMYCIN HCL 750 MG/150ML IV SOLN
750.0000 mg | Freq: Two times a day (BID) | INTRAVENOUS | Status: DC
  Filled 2020-04-18: qty 150

## 2020-04-18 MED ORDER — ACETAMINOPHEN 650 MG RE SUPP: 650.0000 mg | Freq: Four times a day (QID) | RECTAL | Status: DC | PRN

## 2020-04-18 MED ORDER — INSULIN ASPART 100 UNIT/ML ~~LOC~~ SOLN: 0.0000 [IU] | Freq: Every day | SUBCUTANEOUS | Status: DC

## 2020-04-18 MED ORDER — METOPROLOL TARTRATE 25 MG PO TABS
12.5000 mg | ORAL_TABLET | Freq: Two times a day (BID) | ORAL | Status: DC
  Administered 2020-04-19 – 2020-04-24 (×12): 12.5 mg via ORAL
  Filled 2020-04-18 (×13): qty 1

## 2020-04-18 MED ORDER — SODIUM CHLORIDE 0.9 % IV BOLUS
500.0000 mL | Freq: Once | INTRAVENOUS | Status: AC
  Administered 2020-04-18: 500 mL via INTRAVENOUS

## 2020-04-18 MED ORDER — SIMVASTATIN 20 MG PO TABS
20.0000 mg | ORAL_TABLET | Freq: Every day | ORAL | Status: DC
  Administered 2020-04-19 – 2020-04-23 (×5): 20 mg via ORAL
  Filled 2020-04-18 (×5): qty 1

## 2020-04-18 MED ORDER — SODIUM CHLORIDE 0.9% FLUSH
3.0000 mL | Freq: Two times a day (BID) | INTRAVENOUS | Status: DC
  Administered 2020-04-19 – 2020-04-23 (×9): 3 mL via INTRAVENOUS

## 2020-04-18 MED ORDER — CARBIDOPA-LEVODOPA 25-100 MG PO TABS
2.0000 | ORAL_TABLET | Freq: Two times a day (BID) | ORAL | Status: DC
  Administered 2020-04-19 – 2020-04-24 (×12): 2 via ORAL
  Filled 2020-04-18 (×12): qty 2

## 2020-04-18 MED ORDER — NYSTATIN 100000 UNIT/GM EX POWD
Freq: Two times a day (BID) | CUTANEOUS | Status: DC
  Filled 2020-04-18 (×2): qty 15

## 2020-04-18 NOTE — Telephone Encounter (Signed)
lmom for patient to call back to make a follow up appt with Tat

## 2020-04-18 NOTE — Progress Notes (Signed)
Pharmacy Antibiotic Note  Sherri Pratt is a 74 y.o. female admitted on 04/18/2020 with fever of unknown source.  Pharmacy has been consulted for Cefepime and vancomycin dosing. WBC 12.6. SCr 1.42. LA 1.6.   Plan: -Cefepime 2 gm IV Q 12 hours -Vancomycin 1500 mg IV load followed by vancomycin 750 mg IV Q 12 hours -Monitor CBC, renal fx, cultures and clinical progress -VT as indicated      Temp (24hrs), Avg:100.9 F (38.3 C), Min:99.5 F (37.5 C), Max:102.2 F (39 C)  Recent Labs  Lab 04/18/20 1703  WBC 12.6*  CREATININE 1.42*    CrCl cannot be calculated (Unknown ideal weight.).    Allergies  Allergen Reactions  . Peanut-Containing Drug Products     Antimicrobials this admission: Vanc 7/7 >>  Cefepime 7/7 >>   Dose adjustments this admission:  Microbiology results: 7/7 BCx:    Thank you for allowing pharmacy to be a part of this patient's care.  Vinnie Level, PharmD., BCPS, BCCCP Clinical Pharmacist Clinical phone for 04/18/20 until 11:30pm: [phone] If after 11:30pm, please refer to [facility] for unit-specific pharmacist

## 2020-04-18 NOTE — ED Triage Notes (Signed)
Pt BIB [facility] c/o weakness and being malnourished. Pt's son went to check on pt at home. Pt lives at home with husband but husband has dementia and is not the best care giver for the wife due to dementia. Pt's son found pt completely soiled and weak. Pt's son felt like she was lethargic. Pt is normally ambulatory but could not walk. EMS stated there was urine and feces tracks all over the house. EMS gave 500 cc bolus and stated pt perked up afterwards. Son on the way and agrees that pt is not safe at home and potentially needs placement. Pt denies any pain at this time but has a yeast rash under her abdominal folds and creases of her legs along with blistering and break down on the left hip and butt cheek.

## 2020-04-18 NOTE — ED Provider Notes (Signed)
[facility] EMERGENCY DEPARTMENT Provider Note   CSN: [id number] Arrival date & time: 04/18/20  1617     History Chief Complaint  Patient presents with  . Fatigue  . SNF placement  . Malnourished    Sherri Pratt is a 74 y.o. female.  Patient brought in by EMS after her son discovered that the house was in shambles and his mother was not been taking care of properly apparently her caregiver is her husband.  And he has dementia and not able to really care.  This patient was alert.  But does seem to have some degree of confusion.  May be some lethargy according to her with the [facility].  In addition EMS reported at the house had feces and urine all over it.        Past Medical History:  Diagnosis Date  . Anemia   . Chronic kidney disease   . Diabetes mellitus   . Gout   . Hyperlipidemia   . Hypertension     Patient Active Problem List   Diagnosis Date Noted  . Mild obesity 11/12/2018  . Parkinson's disease (HCC) 10/19/2018  . Prolonged QT interval 10/13/2018  . Hypokalemia 10/13/2018  . SDH (subdural hematoma) (HCC) 10/13/2018  . Syncope and collapse 10/12/2018  . Type 2 diabetes mellitus with hypoglycemia without coma (HCC) 10/12/2018  . Paroxysmal atrial fibrillation (HCC) 04/17/2017  . Hypophosphatemia 04/17/2017  . Hypomagnesemia 04/17/2017  . Acute lower UTI 04/17/2017  . Hyperthyroidism 04/17/2017  . Anemia 11/08/2012  . GOUT 11/12/2010  . Chronic kidney disease 03/27/2009  . Diabetes 1.5, managed as type 1 (HCC) 09/21/2007  . Mixed hyperlipidemia 09/21/2007  . Essential hypertension 09/21/2007    Past Surgical History:  Procedure Laterality Date  . APPENDECTOMY       OB History   No obstetric history on file.     Family History  Problem Relation Age of Onset  . Cancer Mother        colon  . Hypertension Mother   . Cancer Father        colon  . Healthy Sister   . Cancer Brother   . Healthy Child     Social History     Tobacco Use  . Smoking status: Never Smoker  . Smokeless tobacco: Never Used  Substance Use Topics  . Alcohol use: No  . Drug use: No    Home Medications Prior to Admission medications   Medication Sig Start Date End Date Taking? Authorizing Provider  allopurinol (ZYLOPRIM) 300 MG tablet Take 1 tablet (300 mg total) by mouth daily. 02/24/20  Yes [name], Kandee Keen, NP  carbidopa-levodopa (SINEMET IR) 25-100 MG tablet Take 2 tablets at 8:30am/2 tablets at 12:30pm/1 tablet at 4:30pm Patient taking differently: Take 1-2 tablets by mouth See admin instructions. Take 2 tablets by mouth at 8:30am,2 tablets by mouth at 12:30pm then take 1 tablet by mouth at 4:30pm 04/12/20  Yes Tat, Rebecca S, DO  ELIQUIS 5 MG TABS tablet TAKE 1 TABLET BY MOUTH TWICE A DAY 09/20/19  Yes Croitoru, Mihai, MD  glipiZIDE (GLUCOTROL XL) 2.5 MG 24 hr tablet Take 1 tablet (2.5 mg total) by mouth daily with breakfast. 02/24/20  Yes [name], Kandee Keen, NP  metoprolol tartrate (LOPRESSOR) 25 MG tablet TAKE 1/2 TABLET BY MOUTH TWICE DAILY Patient taking differently: Take 12.5 mg by mouth 2 (two) times daily.  07/07/19  Yes Croitoru, Mihai, MD  simvastatin (ZOCOR) 20 MG tablet Take 1 tablet (20  mg total) by mouth at bedtime. 02/24/20  Yes [name], Kandee Keen, NP    Allergies    Peanut-containing drug products  Review of Systems   Review of Systems  Unable to perform ROS: Mental status change    Physical Exam Updated Vital Signs BP 130/73   Pulse 88   Temp (!) 100.8 F (38.2 C) (Axillary)   Resp 16   SpO2 95%   Physical Exam Vitals and nursing note reviewed.  Constitutional:      General: She is not in acute distress.    Appearance: Normal appearance. She is well-developed.  HENT:     Head: Normocephalic and atraumatic.  Eyes:     Extraocular Movements: Extraocular movements intact.     Conjunctiva/sclera: Conjunctivae normal.     Pupils: Pupils are equal, round, and reactive to light.  Cardiovascular:     Rate and  Rhythm: Normal rate and regular rhythm.     Heart sounds: No murmur heard.   Pulmonary:     Effort: Pulmonary effort is normal. No respiratory distress.     Breath sounds: Normal breath sounds.  Abdominal:     Palpations: Abdomen is soft.     Tenderness: There is no abdominal tenderness.  Musculoskeletal:        General: Normal range of motion.     Cervical back: Normal range of motion and neck supple.  Skin:    General: Skin is warm and dry.     Capillary Refill: Capillary refill takes less than 2 seconds.     Findings: Lesion and rash present.     Comments: Patient with some yeast type infection skin breakdown in the anterior folds.  Posteriorly there are some mild large bullae clear vesicles sore in the sacral buttocks area which probably represent early decubiti.  Neurological:     General: No focal deficit present.     Mental Status: She is alert.     Cranial Nerves: No cranial nerve deficit.     Sensory: No sensory deficit.     Motor: No weakness.     Comments: Patient way and fairly oriented.  Was able to confirm that her primary care doctor was lowered [place].  But can come up with the name of her self.  Patient denies any significant problems.  No's obvious focal deficits.     ED Results / Procedures / Treatments   Labs (all labs ordered are listed, but only abnormal results are displayed) Labs Reviewed  URINALYSIS, ROUTINE W REFLEX MICROSCOPIC - Abnormal; Notable for the following components:      Result Value   Glucose, UA 150 (*)    Protein, ur 30 (*)    All other components within normal limits  CBC WITH DIFFERENTIAL/PLATELET - Abnormal; Notable for the following components:   WBC 12.6 (*)    Neutro Abs 11.3 (*)    Lymphs Abs 0.5 (*)    All other components within normal limits  COMPREHENSIVE METABOLIC PANEL - Abnormal; Notable for the following components:   Sodium 146 (*)    Chloride 114 (*)    CO2 20 (*)    Glucose, Bld 297 (*)    BUN 55 (*)     Creatinine, Ser 1.42 (*)    GFR calc non Af Amer 36 (*)    GFR calc Af Amer 42 (*)    All other components within normal limits  PROTIME-INR - Abnormal; Notable for the following components:   Prothrombin Time 16.0 (*)  INR 1.3 (*)    All other components within normal limits  CULTURE, BLOOD (ROUTINE X 2)  CULTURE, BLOOD (ROUTINE X 2)  SARS CORONAVIRUS 2 BY RT PCR (HOSPITAL ORDER, PERFORMED IN [facility])  LIPASE, BLOOD  LACTIC ACID, PLASMA  APTT    EKG EKG Interpretation  Date/Time:  Wednesday April 18 2020 16:57:48 EDT Ventricular Rate:  86 PR Interval:    QRS Duration: 99 QT Interval:  416 QTC Calculation: 498 R Axis:   10 Text Interpretation: Sinus rhythm Sinus pause Low voltage, extremity and precordial leads Borderline prolonged QT interval Interpretation limited secondary to artifact Confirmed by Vanetta Mulders [id number]) on 04/18/2020 5:03:22 PM   Radiology CT Head Wo Contrast  Result Date: 04/18/2020 CLINICAL DATA:  74 year old female with altered mental status. EXAM: CT HEAD WITHOUT CONTRAST TECHNIQUE: Contiguous axial images were obtained from the base of the skull through the vertex without intravenous contrast. COMPARISON:  Head CT dated 04/15/2017. FINDINGS: Brain: Mild age-related atrophy and chronic microvascular ischemic changes. There is no acute intracranial hemorrhage. No mass effect or midline shift no extra-axial fluid collection. Vascular: No hyperdense vessel or unexpected calcification. Skull: Normal. Negative for fracture or focal lesion. Sinuses/Orbits: No acute finding. Other: None IMPRESSION: 1. No acute intracranial pathology. 2. Mild age-related atrophy and chronic microvascular ischemic changes. Electronically Signed   By: Elgie Collard M.D.   On: 04/18/2020 20:43   DG Chest Port 1 View  Result Date: 04/18/2020 CLINICAL DATA:  Failure to thrive. EXAM: PORTABLE CHEST 1 VIEW COMPARISON:  October 12, 2018. FINDINGS: Stable  cardiomediastinal silhouette. No pneumothorax or significant pleural effusion is noted. Left lung is clear. Mild right basilar atelectasis or infiltrate is noted. Bony thorax is unremarkable. IMPRESSION: Mild right basilar subsegmental atelectasis or infiltrate. Electronically Signed   By: Lupita Raider M.D.   On: 04/18/2020 17:02    Procedures Procedures (including critical care time)  CRITICAL CARE Performed by: Vanetta Mulders Total critical care time: 45 minutes Critical care time was exclusive of separately billable procedures and treating other patients. Critical care was necessary to treat or prevent imminent or life-threatening deterioration. Critical care was time spent personally by me on the following activities: development of treatment plan with patient and/or surrogate as well as nursing, discussions with consultants, evaluation of patient's response to treatment, examination of patient, obtaining history from patient or surrogate, ordering and performing treatments and interventions, ordering and review of laboratory studies, ordering and review of radiographic studies, pulse oximetry and re-evaluation of patient's condition.   Medications Ordered in ED Medications  0.9 %  sodium chloride infusion ( Intravenous New Bag/Given 04/18/20 1715)  vancomycin (VANCOREADY) IVPB 1500 mg/300 mL (1,500 mg Intravenous New Bag/Given 04/18/20 2106)  ceFEPIme (MAXIPIME) 2 g in sodium chloride 0.9 % 100 mL IVPB (has no administration in time range)  vancomycin (VANCOREADY) IVPB 750 mg/150 mL (has no administration in time range)  acetaminophen (TYLENOL) tablet 650 mg (650 mg Oral Given 04/18/20 1752)  sodium chloride 0.9 % bolus 500 mL (0 mLs Intravenous Stopped 04/18/20 1943)  ceFEPIme (MAXIPIME) 2 g in sodium chloride 0.9 % 100 mL IVPB (0 g Intravenous Stopped 04/18/20 1943)  metroNIDAZOLE (FLAGYL) IVPB 500 mg (0 mg Intravenous Stopped 04/18/20 2058)    ED Course  I have reviewed the triage vital  signs and the nursing notes.  Pertinent labs & imaging results that were available during my care of the patient were reviewed by me and considered in my medical  decision making (see chart for details).    MDM Rules/Calculators/A&P                         Based on patient's presentation from EMS patient is going to require nursing home placement.  Shortly after being here patient had a fever to 102.2.  Broad-spectrum antibiotics were ordered based off of the sepsis protocol the patient did not meet sepsis criteria based on her vital signs was never hypotensive not tachycardic.  Lactic acid was not elevated.  Urine was normal.  Chest x-ray raise concerns about a right lower lobe infiltrate.  Broad-spectrum antibiotics will cover that.  Patient given Tylenol for the fever brought the temp down to 100.8.  Patient's BUN and creatinine consistent with some mild acute kidney injury based off her baseline.  Which would go along with the dehydration.  Head CT without any acute findings.  Patient although awake and alert probably has some degree of confusion.  Test ordered is not back yet.  Discussed with hospitalist who will admit.  Final Clinical Impression(s) / ED Diagnoses Final diagnoses:  Failure to thrive in adult  AKI (acute kidney injury) (HCC)  Dehydration  Fever, unspecified fever cause    Rx / DC Orders ED Discharge Orders    None       Vanetta MuldersZackowski, Aviah Sorci, MD 04/18/20 2136

## 2020-04-18 NOTE — H&P (Signed)
History and Physical    PLEASE NOTE THAT DRAGON DICTATION SOFTWARE WAS USED IN THE CONSTRUCTION OF THIS NOTE.   Sherri Pratt NVB:[id number] DOB: September 16, 1946 DOA: 04/18/2020  PCP: [name], NP Patient coming from: home   I have personally briefly reviewed patient's old medical records in [facility]  Chief Complaint: confusion   HPI: Sherri Pratt is a 74 y.o. female with medical history significant for paroxysmal atrial fibrillation chronically anticoagulated on Eliquis, hypertension, hyperlipidemia, type 2 diabetes mellitus, Parkinson's disease who is admitted to [facility] on 04/18/2020 with severe sepsis due to suspected right lower lobe pneumonia after presenting from home to [facility] Emergency Department for evaluation of confusion.   In the setting of the patient's presenting confusion, the following history is obtained via my discussions with the patient's son, who is present at bedside, in addition to my discussions with the emergency department physician and via chart review.  The patient's son reports that he had previously visited his mother at home on Saturday, 04/14/2020 at which time he noted the patient to be at her baseline mental status, at which she would be completely alert and oriented, including being able to correctly identify her date of birth as well as the President of the [place].  However, when the son visited his mother again today, he noted interval development of confusion as well as worsening somnolence.  The patient reported subjective fever over the last day, but is otherwise without acute complaint at this time.  Patient son is not aware of any interval trauma that his mother may have incurred.  He reports that at baseline, the patient lives independently with her husband, who suffers from advanced dementia.  Additionally, baseline she is able to ambulate without assistance. Son confirms that patient suffers from Parkinson's Disease and  that the resting tremors present in the ED are consistent with baseline. Patient's son suspects that the patient has had very poor oral intake developing in the interval since he last saw her on Saturday, 04/14/20, concominant with interval development of confusion.   In the setting of interval development of confusion, increased somnolence, and generalized weakness, the patient's son called EMS, who subsequently brought the patient to [facility] emergency department for further evaluation management of such. No recent traveling or known COVID-19 exposures.     ED Course:  Vital signs in the ED were notable for the following: T-max 102.2, trending down to 100.8 following interval acetaminophen; heart rate 88-91; initial blood pressure one 4/63, which increased to 130/73 following interval IV fluid administration, as further described low; respirate 16-21, and oxygen saturation 95 to 96% on room air.  Labs were notable for the following: CMP notable for the following: Sodium 146, chloride 114, bicarbonate 20, anion gap 20, creatinine 1.42 relative to most recent prior creatinine did 1 of 1.10 on 02/24/2020, glucose 297, and liver enzymes are found to be within normal limits.  CBC notable for white cell count of 12,600 with 89% neutrophils.  Lactic acid 1.6.  Urinalysis showed 0-5 white blood cells, no bacteria, showed 30 proteinuria, no RBCs.  Nasopharyngeal COVID-19 PCR was obtained in ED, with result currently pending.  Blood cultures x2 were collected prior to initiation of antibiotics.  Chest x-ray showed a right basilar opacity concerning for infiltrate, but otherwise showed no evidence of acute cardiopulmonary process.  EKG showed sinus rhythm with a rate 84, QTc 493 ms, and no evidence of T wave or ST changes.  While in the ED, the following were administered: Cefepime 2 g IV x1, Flagyl 500 mg IV x1, dose of IV Vanco x1, normal saline x500 cc bolus followed by transition to normal saline 75 cc/h,  acetaminophen 650 mg p.o. x1.  Subsequently, the patient was admitted to the med telemetry floor for further evaluation management of presenting severe sepsis due to suspected right lower lobe pneumonia.    Review of Systems: As per HPI otherwise 10 point review of systems negative.   Past Medical History:  Diagnosis Date  . Anemia   . Atrial fibrillation ([place])    chronically on Eliquis  . Chronic kidney disease   . Diabetes mellitus   . Gout   . Hyperlipidemia   . Hypertension   . Parkinson's disease [facility])     Past Surgical History:  Procedure Laterality Date  . APPENDECTOMY      Social History:  reports that she has never smoked. She has never used smokeless tobacco. She reports that she does not drink alcohol and does not use drugs.   Allergies  Allergen Reactions  . Peanut-Containing Drug Products     unknown    Family History  Problem Relation Age of Onset  . Cancer Mother        colon  . Hypertension Mother   . Cancer Father        colon  . Healthy Sister   . Cancer Brother   . Healthy Child      Prior to Admission medications   Medication Sig Start Date End Date Taking? Authorizing Provider  allopurinol (ZYLOPRIM) 300 MG tablet Take 1 tablet (300 mg total) by mouth daily. 02/24/20  Yes [name], Tommi Rumps, NP  carbidopa-levodopa (SINEMET IR) 25-100 MG tablet Take 2 tablets at 8:30am/2 tablets at 12:30pm/1 tablet at 4:30pm Patient taking differently: Take 1-2 tablets by mouth See admin instructions. Take 2 tablets by mouth at 8:30am,2 tablets by mouth at 12:30pm then take 1 tablet by mouth at 4:30pm 04/12/20  Yes Tat, Rebecca S, DO  ELIQUIS 5 MG TABS tablet TAKE 1 TABLET BY MOUTH TWICE A DAY 09/20/19  Yes Croitoru, Mihai, MD  glipiZIDE (GLUCOTROL XL) 2.5 MG 24 hr tablet Take 1 tablet (2.5 mg total) by mouth daily with breakfast. 02/24/20  Yes [name], Tommi Rumps, NP  metoprolol tartrate (LOPRESSOR) 25 MG tablet TAKE 1/2 TABLET BY MOUTH TWICE DAILY Patient taking  differently: Take 12.5 mg by mouth 2 (two) times daily.  07/07/19  Yes Croitoru, Mihai, MD  simvastatin (ZOCOR) 20 MG tablet Take 1 tablet (20 mg total) by mouth at bedtime. 02/24/20  Yes [name], Tommi Rumps, NP     Objective    Physical Exam: Vitals:   04/18/20 1750 04/18/20 1800 04/18/20 1845 04/18/20 2100  BP:  134/71  130/73  Pulse:  95  88  Resp:  (!) 21  16  Temp: (!) 102.2 F (39 C)  (!) 100.8 F (38.2 C)   TempSrc: Axillary  Axillary   SpO2:  94%  95%    General: appears to be stated age; alert; oriented to time; but not oriented to person (could not ID the president), place (thought she was at [facility]), and was not able to ID her birthday.  Skin: warm, dry, no rash Head:  AT/[place] Eyes:  PEARL b/l, EOMI Mouth:  Oral mucosa membranes appear dry, normal dentition Neck: supple; trachea midline Heart:  RRR; did not appreciate any M/R/G Lungs: right basilar crackles, otherwise CTAB, did not appreciate any wheezes  or rhonchi Abdomen: + BS; soft, ND, NT Vascular: 2+ pedal pulses b/l; 2+ radial pulses b/l Extremities: no peripheral edema, no muscle wasting Neuro: resting tremor noted to be a/w left hand; sensation intact in upper and lower extremities b/l.     Labs on Admission: I have personally reviewed following labs and imaging studies  CBC: Recent Labs  Lab 04/18/20 1703  WBC 12.6*  NEUTROABS 11.3*  HGB 12.2  HCT 38.3  MCV 85.9  PLT 627   Basic Metabolic Panel: Recent Labs  Lab 04/18/20 1703  NA 146*  K 3.9  CL 114*  CO2 20*  GLUCOSE 297*  BUN 55*  CREATININE 1.42*  CALCIUM 9.0   GFR: CrCl cannot be calculated (Unknown ideal weight.). Liver Function Tests: Recent Labs  Lab 04/18/20 1703  AST 28  ALT 20  ALKPHOS 65  BILITOT 0.7  PROT 7.0  ALBUMIN 3.5   Recent Labs  Lab 04/18/20 1703  LIPASE 26   No results for input(s): AMMONIA in the last 168 hours. Coagulation Profile: Recent Labs  Lab 04/18/20 1752  INR 1.3*   Cardiac Enzymes: No  results for input(s): CKTOTAL, CKMB, CKMBINDEX, TROPONINI in the last 168 hours. BNP (last 3 results) No results for input(s): PROBNP in the last 8760 hours. HbA1C: No results for input(s): HGBA1C in the last 72 hours. CBG: No results for input(s): GLUCAP in the last 168 hours. Lipid Profile: No results for input(s): CHOL, HDL, LDLCALC, TRIG, CHOLHDL, LDLDIRECT in the last 72 hours. Thyroid Function Tests: No results for input(s): TSH, T4TOTAL, FREET4, T3FREE, THYROIDAB in the last 72 hours. Anemia Panel: No results for input(s): VITAMINB12, FOLATE, FERRITIN, TIBC, IRON, RETICCTPCT in the last 72 hours. Urine analysis:    Component Value Date/Time   COLORURINE YELLOW 04/18/2020 [place] 04/18/2020 1755   LABSPEC 1.025 04/18/2020 1755   PHURINE 5.0 04/18/2020 1755   GLUCOSEU 150 (A) 04/18/2020 1755   HGBUR NEGATIVE 04/18/2020 1755   HGBUR negative 11/04/2010 0921   BILIRUBINUR NEGATIVE 04/18/2020 1755   BILIRUBINUR 1+ 09/27/2018 1156   KETONESUR NEGATIVE 04/18/2020 1755   PROTEINUR 30 (A) 04/18/2020 1755   UROBILINOGEN 1.0 09/27/2018 1156   UROBILINOGEN 0.2 11/04/2010 0921   NITRITE NEGATIVE 04/18/2020 1755   LEUKOCYTESUR NEGATIVE 04/18/2020 1755    Radiological Exams on Admission: CT Head Wo Contrast  Result Date: 04/18/2020 CLINICAL DATA:  74 year old female with altered mental status. EXAM: CT HEAD WITHOUT CONTRAST TECHNIQUE: Contiguous axial images were obtained from the base of the skull through the vertex without intravenous contrast. COMPARISON:  Head CT dated 04/15/2017. FINDINGS: Brain: Mild age-related atrophy and chronic microvascular ischemic changes. There is no acute intracranial hemorrhage. No mass effect or midline shift no extra-axial fluid collection. Vascular: No hyperdense vessel or unexpected calcification. Skull: Normal. Negative for fracture or focal lesion. Sinuses/Orbits: No acute finding. Other: None IMPRESSION: 1. No acute intracranial  pathology. 2. Mild age-related atrophy and chronic microvascular ischemic changes. Electronically Signed   By: Anner Crete M.D.   On: 04/18/2020 20:43   DG Chest Port 1 View  Result Date: 04/18/2020 CLINICAL DATA:  Failure to thrive. EXAM: PORTABLE CHEST 1 VIEW COMPARISON:  October 12, 2018. FINDINGS: Stable cardiomediastinal silhouette. No pneumothorax or significant pleural effusion is noted. Left lung is clear. Mild right basilar atelectasis or infiltrate is noted. Bony thorax is unremarkable. IMPRESSION: Mild right basilar subsegmental atelectasis or infiltrate. Electronically Signed   By: Marijo Conception M.D.   On:  04/18/2020 17:02     EKG: Independently reviewed, with result as described above.    Assessment/Plan   Sherri Pratt is a 74 y.o. female with medical history significant for paroxysmal atrial fibrillation chronically anticoagulated on Eliquis, hypertension, hyperlipidemia, type 2 diabetes mellitus, Parkinson's disease who is admitted to [facility] on 04/18/2020 with severe sepsis due to suspected right lower lobe pneumonia after presenting from home to [facility] Emergency Department for evaluation of confusion.    Principal Problem:   Severe sepsis ([place]) Active Problems:   AKI (acute kidney injury) ([place])   Paroxysmal atrial fibrillation (HCC)   Prolonged QT interval   CAP (community acquired pneumonia)   General weakness   Acute metabolic encephalopathy    #) Severe sepsis due to right lower lobe pneumonia: Diagnosis on the basis of development of acute confusion, objective fever, and chest x-ray showing evidence of a right basilar opacity concerning for infiltrate.  There is cardiomegaly of presenting objective fever as well as leukocytosis.  Criteria are met for the patient's sepsis to be considered severe nature on the basis of associated evidence of endorgan damage in the form of concomitant acute kidney injury as well as acute metabolic  encephalopathy.  Of note, presenting lactic acid value nonelevated 1.6, and no evidence of hypotension thus far.  In terms of other potential sources of underlying infection, urinalysis is not consistent with urinary tract infection, no meningeal signs, and liver enzymes appear to be within normal limits.  Result of nasopharyngeal COVID-19 PCR performed in the ED this evening is currently pending.  Of note, the patient does not appear to have been admitted over the course of the last 3 months, there would consider the community-acquired in nature.  However, in the setting of associated severe sepsis, will continue with broad-spectrum antibiotics in the form of cefepime and IV vancomycin.  Notable absence within this spectrum of coverage includes atypicals, and could therefore consider addition of doxycycline should the patient clinically decompensate, with rationale for doxycycline on the basis of presenting QTC prolongation.  Of note, cultures x2 collected in the ED prior to initiation of antibiotics.  Plan: Cefepime and IV vancomycin.  Check MRSA PCR, with plan to discontinue vancomycin in the event of a negative MRSA PCR finding given the high negative predictive value associated with this finding.  Lactated Ringer's at 75 cc/h.  Follow-up results of blood cultures x2.  Repeat CBC with differential in the morning.  Monitor for results of nasopharyngeal COVID-19 PCR.  As done.  Add on strep urine antigen.  Incentive spirometry.  As needed acetaminophen.     #) Acute Kidney Injury: Presenting creatinine noted to be 1.42 relative to baseline/most recent value of 1.10 on 02/24/2020. suspect that this is prerenal in nature stemming from intravascular depletion due to severe sepsis as well as dehydration on the basis of decline in p.o. intake over the last few days.  Of note, presenting urinalysis was associated with 30 proteinuria, but otherwise demonstrated no urinary gas.  Plan: monitor strict I's & O's and  daily weights. Attempt to avoid nephrotoxic agents. Refrain from NSAIDs. Repeat BMP in the morning. Check serum magnesium level.  Add on random urine sodium and random urine creatinine.  Lactated Ringer's, as above.  Work-up and management of presenting severe sepsis, as above.    #) Acute metabolic encephalopathy: The patient's son reports that the patient has exhibited evidence of confusion relative to her baseline mental status with onset sometime over  the last 2 to 3 days, as further qualified above.  Suspect that this is on the basis of physiologic stress stemming from presenting severe sepsis due to right lower lobe pneumonia.  Plan: Recommend management of severe sepsis due to suspected pneumonia.  Check TSH as well as urinary drug screen.      #) Generalized weakness: In the setting of the patient's sons report of ability to ambulate without assistance at baseline, the patient appears unable to ambulate without 1-2 person assist at this time in the setting of generalized weakness, without any objective evidence of acute focal neurologic deficits.  Suspect that this presenting generalized weakness is on the basis of physiologic stress stemming from presenting for sepsis due to right lower lobe pneumonia as well as suspected additional contribution from dehydration.  Plan: Work-up and management as resuscitated monitor, as above.  IV fluids as above.  Physical therapy consult has been placed for the morning.  Check TSH.     #) QT prolongation: Presenting EKG shows QTC of 493 ms.   Plan: Monitor on telemetry.  Add on serum magnesium level.  Consider repeat EKG in the morning.     #) Type 2 diabetes mellitus: On glipizide as her sole outpatient oral hypoglycemic agent.  Not on any insulin at home.  Presenting blood sugar noted to be 297.  Objectively, the patient's son reports that the patient is episodes of hypoglycemia in the past.  Given report of a history of hypoglycemic episodes,  will refrain from aggressive glycemic intervention.  Plan: Check hemoglobin A1c.  Accu-Cheks before every meal and at bedtime with low-dose sliding scale insulin.  Hold home glipizide for now.     #) Paroxysmal atrial fibrillation: Documented history of such. In the setting of a CHA2DS2-VASc score of 5, there is an indication for the patient to be on chronic anticoagulation for thromboembolic prophylaxis. Consistent with this, the patient is chronically anticoagulated on Eliquis. Home AV nodal blocking regimen: Metoprolol tartrate.  Presenting EKG reflects sinus rhythm without evidence of acute ischemic changes.  Plan: monitor strict I's & O's and daily weights. Repeat BMP in the morning. Check serum magnesium level. Continue home AV nodal blocking regimen, as above.  Continue home Eliquis.     #) Parkinson's disease: Resting tremor associated with the left hand is consistent with baseline resting tremors per the patient's son.  She is on Sinemet at home  Plan: Continue home Sinemet.    DVT prophylaxis: Continue home Eliquis Code Status: Full code Family Communication: Case were discussed with the patient son, who was present at bedside. Disposition Plan: Per Rounding Team Consults called: none  Admission status: Inpatient; med telemetry.    PLEASE NOTE THAT DRAGON DICTATION SOFTWARE WAS USED IN THE CONSTRUCTION OF THIS NOTE.   Rhetta Mura DO [facility] Pager [phone] From [place]  04/18/2020, 9:21 PM

## 2020-04-19 DIAGNOSIS — A419 Sepsis, unspecified organism: Secondary | ICD-10-CM | POA: Diagnosis present

## 2020-04-19 LAB — CBC WITH DIFFERENTIAL/PLATELET
Abs Immature Granulocytes: 0.03 10*3/uL (ref 0.00–0.07)
Basophils Absolute: 0 10*3/uL (ref 0.0–0.1)
Basophils Relative: 0 %
Eosinophils Absolute: 0.1 10*3/uL (ref 0.0–0.5)
Eosinophils Relative: 1 %
HCT: 37.1 % (ref 36.0–46.0)
Hemoglobin: 11.3 g/dL — ABNORMAL LOW (ref 12.0–15.0)
Immature Granulocytes: 0 %
Lymphocytes Relative: 12 %
Lymphs Abs: 1.1 10*3/uL (ref 0.7–4.0)
MCH: 27.1 pg (ref 26.0–34.0)
MCHC: 30.5 g/dL (ref 30.0–36.0)
MCV: 89 fL (ref 80.0–100.0)
Monocytes Absolute: 0.9 10*3/uL (ref 0.1–1.0)
Monocytes Relative: 9 %
Neutro Abs: 7.3 10*3/uL (ref 1.7–7.7)
Neutrophils Relative %: 78 %
Platelets: 197 10*3/uL (ref 150–400)
RBC: 4.17 MIL/uL (ref 3.87–5.11)
RDW: 14.7 % (ref 11.5–15.5)
WBC: 9.3 10*3/uL (ref 4.0–10.5)
nRBC: 0 % (ref 0.0–0.2)

## 2020-04-19 LAB — HEMOGLOBIN A1C
Hgb A1c MFr Bld: 7 % — ABNORMAL HIGH (ref 4.8–5.6)
Mean Plasma Glucose: 154.2 mg/dL

## 2020-04-19 LAB — CREATININE, URINE, RANDOM: Creatinine, Urine: 158.68 mg/dL

## 2020-04-19 LAB — PROCALCITONIN: Procalcitonin: <0.1 ng/mL

## 2020-04-19 LAB — COMPREHENSIVE METABOLIC PANEL
ALT: 17 U/L (ref 0–44)
AST: 27 U/L (ref 15–41)
Albumin: 3.2 g/dL — ABNORMAL LOW (ref 3.5–5.0)
Alkaline Phosphatase: 54 U/L (ref 38–126)
Anion gap: 10 (ref 5–15)
BUN: 49 mg/dL — ABNORMAL HIGH (ref 8–23)
CO2: 19 mmol/L — ABNORMAL LOW (ref 22–32)
Calcium: 8.9 mg/dL (ref 8.9–10.3)
Chloride: 119 mmol/L — ABNORMAL HIGH (ref 98–111)
Creatinine, Ser: 1.3 mg/dL — ABNORMAL HIGH (ref 0.44–1.00)
GFR calc Af Amer: 46 mL/min — ABNORMAL LOW (ref >60)
GFR calc non Af Amer: 40 mL/min — ABNORMAL LOW (ref >60)
Glucose, Bld: 180 mg/dL — ABNORMAL HIGH (ref 70–99)
Potassium: 4 mmol/L (ref 3.5–5.1)
Sodium: 148 mmol/L — ABNORMAL HIGH (ref 135–145)
Total Bilirubin: 1.1 mg/dL (ref 0.3–1.2)
Total Protein: 6.2 g/dL — ABNORMAL LOW (ref 6.5–8.1)

## 2020-04-19 LAB — CBG MONITORING, ED
Glucose-Capillary: 135 mg/dL — ABNORMAL HIGH (ref 70–99)
Glucose-Capillary: 142 mg/dL — ABNORMAL HIGH (ref 70–99)
Glucose-Capillary: 151 mg/dL — ABNORMAL HIGH (ref 70–99)

## 2020-04-19 LAB — GLUCOSE, CAPILLARY
Glucose-Capillary: 100 mg/dL — ABNORMAL HIGH (ref 70–99)
Glucose-Capillary: 135 mg/dL — ABNORMAL HIGH (ref 70–99)

## 2020-04-19 LAB — MAGNESIUM: Magnesium: 2.2 mg/dL (ref 1.7–2.4)

## 2020-04-19 LAB — RAPID URINE DRUG SCREEN, HOSP PERFORMED
Amphetamines: NOT DETECTED
Barbiturates: NOT DETECTED
Benzodiazepines: NOT DETECTED
Cocaine: NOT DETECTED
Opiates: NOT DETECTED
Tetrahydrocannabinol: NOT DETECTED

## 2020-04-19 LAB — TSH: TSH: 0.923 u[IU]/mL (ref 0.350–4.500)

## 2020-04-19 LAB — HIV ANTIBODY (ROUTINE TESTING W REFLEX): HIV Screen 4th Generation wRfx: NONREACTIVE

## 2020-04-19 LAB — SODIUM, URINE, RANDOM: Sodium, Ur: 28 mmol/L

## 2020-04-19 MED ORDER — SODIUM CHLORIDE 0.9 % IV SOLN
100.0000 mg | Freq: Two times a day (BID) | INTRAVENOUS | Status: DC
  Administered 2020-04-19 (×2): 100 mg via INTRAVENOUS
  Filled 2020-04-19 (×6): qty 100

## 2020-04-19 MED ORDER — SODIUM CHLORIDE 0.9 % IV SOLN
2.0000 g | INTRAVENOUS | Status: DC
  Administered 2020-04-19 – 2020-04-20 (×2): 2 g via INTRAVENOUS
  Filled 2020-04-19 (×2): qty 20
  Filled 2020-04-19: qty 2

## 2020-04-19 NOTE — Plan of Care (Signed)
  Problem: Clinical Measurements: Goal: Ability to maintain clinical measurements within normal limits will improve Outcome: Progressing Goal: Diagnostic test results will improve Outcome: Progressing Goal: Respiratory complications will improve Outcome: Progressing Goal: Cardiovascular complication will be avoided Outcome: Progressing   Problem: Coping: Goal: Level of anxiety will decrease Outcome: Progressing   Problem: Elimination: Goal: Will not experience complications related to bowel motility Outcome: Progressing Goal: Will not experience complications related to urinary retention Outcome: Progressing   Problem: Pain Managment: Goal: General experience of comfort will improve Outcome: Progressing   Problem: Safety: Goal: Ability to remain free from injury will improve Outcome: Progressing   Problem: Education: Goal: Knowledge of General Education information will improve Description: Including pain rating scale, medication(s)/side effects and non-pharmacologic comfort measures Outcome: Not Progressing   Problem: Health Behavior/Discharge Planning: Goal: Ability to manage health-related needs will improve Outcome: Not Progressing   Problem: Clinical Measurements: Goal: Will remain free from infection Outcome: Not Progressing   Problem: Activity: Goal: Risk for activity intolerance will decrease Outcome: Not Progressing   Problem: Nutrition: Goal: Adequate nutrition will be maintained Outcome: Not Progressing   Problem: Skin Integrity: Goal: Risk for impaired skin integrity will decrease Outcome: Not Progressing

## 2020-04-19 NOTE — Progress Notes (Signed)
PROGRESS NOTE    Sherri Pratt  WJX:[id number] DOB: 1946/02/12 DOA: 04/18/2020 PCP: [name], NP      Brief Narrative:  Sherri Pratt is a 74 y.o. F with obesity, A. fib on Eliquis, CKD IIIb, HTN, DM, and Parkinson's disease who presented with confusion.  Evidently the patient had a few days of progressive poor oral intake, fever, and decreased mentation.  Finally she was found by her son somnolent, with generalized weakness and malaise.  In the ER, CXR showed RLL infiltrate.,  Leukocytosis, and encephalopathy.  She was started on empiric antibiotics in the hospital service were asked to evaluate.       Assessment & Plan:  Sepsis due to RLL pneumonia Acute metabolic encephalopathy due to sepsis Patient presented with fever, leukocytosis, and encephalopathy in setting of right lower lobe pneumonia. -Narrow antibiotics to ceftriaxone and doxycycline -Follow blood cultures urine antigen   Paroxysmal atrial fibrillation -Continue metoprolol, Eliquis  QT interval Mag and K normal. -Repeat ECG tomorrow  CKD stage IIIb Creatinine near baseline -Monitor BMP  Hypertension Blood pressure stable -Continue metoprolol  Diabetes Glucose controlled this morning Hemoglobin A1c 7% -Hold home glipizide -Continue sliding scale correction insulin  Parkinson's disease -Continue Sinemet  Gout -Hold allopurinol for now  Hypernatremia Dehydration Na remains >145.  Poor oral intake reported. -Continue IV fluids      Disposition: Status is: Inpatient  Remains inpatient appropriate because:Altered mental status, IV treatments appropriate due to intensity of illness or inability to take PO and Inpatient level of care appropriate due to severity of illness   Dispo: The patient is from: Home              Anticipated d/c is to: SNF              Anticipated d/c date is: 3 days              Patient currently is not medically stable to d/c.    Will transfer to [facility] due to  bed availability.  Patient stable for transport hemodynamically.          MDM: The below labs and imaging reports were reviewed and summarized above.  Medication management as above.   DVT prophylaxis:  apixaban (ELIQUIS) tablet 5 mg  Code Status: FULL Family Communication: son by phone    Consultants:   Procedures:     Antimicrobials:   Cefepime and flagyl and vancomycin x1  Ceftriaxone and doxycycline 7/8 >>   Culture data:   7/7 blood cultures x2 -- ngtd           Subjective: Patient sleepy.  No fever overnight.  Still somewhat somnolent and altered. No respiratory distress.  No leg swelling.  No pain complaints.  Objective: Vitals:   04/19/20 0300 04/19/20 0315 04/19/20 0330 04/19/20 0800  BP: (!) 143/58 (!) 146/90 (!) 137/56   Pulse: 78 84 (!) 58   Resp: 19 17 15    Temp:      TempSrc:      SpO2: 94% 96% 98%   Weight:    88.9 kg  Height:    5\' 5"  (1.651 m)    Intake/Output Summary (Last 24 hours) at 04/19/2020 0839 Last data filed at 04/18/2020 2355 Gross per 24 hour  Intake 1800 ml  Output --  Net 1800 ml   Filed Weights   04/19/20 0800  Weight: 88.9 kg    Examination: General appearance: elderly adult female, somnolent and in no acute distress.  HEENT: Anicteric, conjunctiva pink, lids and lashes normal. No nasal deformity, discharge, epistaxis.  Lips dry cracked, OP very dry, caked oral mucosa, dentition normal, hearing reduced.   Skin: Warm and dry.  No jaundice.  No suspicious rashes or lesions. Cardiac: RRR, nl S1-S2, no murmurs appreciated.  Capillary refill is brisk.  JVP not visible.  No LE edema.  Radial pulses 2+ and symmetric. Respiratory: Shallow respiratory effort, no rales appreicated, no wheezing Abdomen: Abdomen soft.  No TTP or grimace. No ascites, distension, hepatosplenomegaly.   MSK: No deformities or effusions. Neuro: Awake and oriented to self, [place], but very somnolent.  Severe generalized weakness, 3/5 in  all extremities, unable to lift arms against gravity.  Speech weak but fluent.    Psych: Attention diminished, affeect blunted, oriented to self, place, situation but extremtly somnolent.      Data Reviewed: I have personally reviewed following labs and imaging studies:  CBC: Recent Labs  Lab 04/18/20 1703 04/19/20 0349  WBC 12.6* 9.3  NEUTROABS 11.3* 7.3  HGB 12.2 11.3*  HCT 38.3 37.1  MCV 85.9 89.0  PLT 222 197   Basic Metabolic Panel: Recent Labs  Lab 04/18/20 1703 04/19/20 0349  NA 146* 148*  K 3.9 4.0  CL 114* 119*  CO2 20* 19*  GLUCOSE 297* 180*  BUN 55* 49*  CREATININE 1.42* 1.30*  CALCIUM 9.0 8.9  MG  --  2.2   GFR: Estimated Creatinine Clearance: 41.2 mL/min (A) (by C-G formula based on SCr of 1.3 mg/dL (H)). Liver Function Tests: Recent Labs  Lab 04/18/20 1703 04/19/20 0349  AST 28 27  ALT 20 17  ALKPHOS 65 54  BILITOT 0.7 1.1  PROT 7.0 6.2*  ALBUMIN 3.5 3.2*   Recent Labs  Lab 04/18/20 1703  LIPASE 26   No results for input(s): AMMONIA in the last 168 hours. Coagulation Profile: Recent Labs  Lab 04/18/20 1752  INR 1.3*   Cardiac Enzymes: No results for input(s): CKTOTAL, CKMB, CKMBINDEX, TROPONINI in the last 168 hours. BNP (last 3 results) No results for input(s): PROBNP in the last 8760 hours. HbA1C: Recent Labs    04/19/20 0349  HGBA1C 7.0*   CBG: Recent Labs  Lab 04/19/20 0000 04/19/20 0742  GLUCAP 135* 151*   Lipid Profile: No results for input(s): CHOL, HDL, LDLCALC, TRIG, CHOLHDL, LDLDIRECT in the last 72 hours. Thyroid Function Tests: Recent Labs    04/19/20 0349  TSH 0.923   Anemia Panel: No results for input(s): VITAMINB12, FOLATE, FERRITIN, TIBC, IRON, RETICCTPCT in the last 72 hours. Urine analysis:    Component Value Date/Time   COLORURINE YELLOW 04/18/2020 1755   APPEARANCEUR CLEAR 04/18/2020 1755   LABSPEC 1.025 04/18/2020 1755   PHURINE 5.0 04/18/2020 1755   GLUCOSEU 150 (A) 04/18/2020 1755    HGBUR NEGATIVE 04/18/2020 1755   HGBUR negative 11/04/2010 0921   BILIRUBINUR NEGATIVE 04/18/2020 1755   BILIRUBINUR 1+ 09/27/2018 1156   KETONESUR NEGATIVE 04/18/2020 1755   PROTEINUR 30 (A) 04/18/2020 1755   UROBILINOGEN 1.0 09/27/2018 1156   UROBILINOGEN 0.2 11/04/2010 0921   NITRITE NEGATIVE 04/18/2020 1755   LEUKOCYTESUR NEGATIVE 04/18/2020 1755   Sepsis Labs: @LABRCNTIP (procalcitonin:4,lacticacidven:4)  ) Recent Results (from the past 240 hour(s))  SARS Coronavirus 2 by RT PCR (hospital order, performed in [facility] hospital lab) Nasopharyngeal Nasopharyngeal Swab     Status: None   Collection Time: 04/18/20  9:09 PM   Specimen: Nasopharyngeal Swab  Result Value Ref Range Status  SARS Coronavirus 2 NEGATIVE NEGATIVE Final    Comment: (NOTE) SARS-CoV-2 target nucleic acids are NOT DETECTED.  The SARS-CoV-2 RNA is generally detectable in upper and lower respiratory specimens during the acute phase of infection. The lowest concentration of SARS-CoV-2 viral copies this assay can detect is 250 copies / mL. A negative result does not preclude SARS-CoV-2 infection and should not be used as the sole basis for treatment or other patient management decisions.  A negative result may occur with improper specimen collection / handling, submission of specimen other than nasopharyngeal swab, presence of viral mutation(s) within the areas targeted by this assay, and inadequate number of viral copies (<250 copies / mL). A negative result must be combined with clinical observations, patient history, and epidemiological information.  Fact Sheet for Patients:   BoilerBrush.com.cy  Fact Sheet for Healthcare Providers: https://pope.com/  This test is not yet approved or  cleared by the [place] FDA and has been authorized for detection and/or diagnosis of SARS-CoV-2 by FDA under an Emergency Use Authorization (EUA).  This EUA will  remain in effect (meaning this test can be used) for the duration of the COVID-19 declaration under Section 564(b)(1) of the Act, 21 U.S.C. section 360bbb-3(b)(1), unless the authorization is terminated or revoked sooner.  Performed at [facility], [address] [place], [place] [address]          Radiology Studies: CT Head Wo Contrast  Result Date: 04/18/2020 CLINICAL DATA:  74 year old female with altered mental status. EXAM: CT HEAD WITHOUT CONTRAST TECHNIQUE: Contiguous axial images were obtained from the base of the skull through the vertex without intravenous contrast. COMPARISON:  Head CT dated 04/15/2017. FINDINGS: Brain: Mild age-related atrophy and chronic microvascular ischemic changes. There is no acute intracranial hemorrhage. No mass effect or midline shift no extra-axial fluid collection. Vascular: No hyperdense vessel or unexpected calcification. Skull: Normal. Negative for fracture or focal lesion. Sinuses/Orbits: No acute finding. Other: None IMPRESSION: 1. No acute intracranial pathology. 2. Mild age-related atrophy and chronic microvascular ischemic changes. Electronically Signed   By: Elgie Collard M.D.   On: 04/18/2020 20:43   DG Chest Port 1 View  Result Date: 04/18/2020 CLINICAL DATA:  Failure to thrive. EXAM: PORTABLE CHEST 1 VIEW COMPARISON:  October 12, 2018. FINDINGS: Stable cardiomediastinal silhouette. No pneumothorax or significant pleural effusion is noted. Left lung is clear. Mild right basilar atelectasis or infiltrate is noted. Bony thorax is unremarkable. IMPRESSION: Mild right basilar subsegmental atelectasis or infiltrate. Electronically Signed   By: Lupita Raider M.D.   On: 04/18/2020 17:02        Scheduled Meds: . apixaban  5 mg Oral BID  . carbidopa-levodopa  2 tablet Oral BID WC   And  . carbidopa-levodopa  1 tablet Oral q1600  . insulin aspart  0-5 Units Subcutaneous QHS  . insulin aspart  0-9 Units Subcutaneous TID WC  .  metoprolol tartrate  12.5 mg Oral BID  . nystatin   Topical BID  . simvastatin  20 mg Oral QHS  . sodium chloride flush  3 mL Intravenous Q12H   Continuous Infusions: . cefTRIAXone (ROCEPHIN)  IV    . doxycycline (VIBRAMYCIN) IV    . lactated ringers 75 mL/hr at 04/19/20 0006     LOS: 1 day    Time spent: 25 minutes    Alberteen Sam, MD [facility] 04/19/2020, 8:39 AM     Please page though AMION or Epic secure chat:  For  Amion password, contact charge nurse

## 2020-04-19 NOTE — ED Notes (Signed)
Ordered breakfast--Sherri Pratt 

## 2020-04-19 NOTE — ED Notes (Signed)
Spoke to son to update him as to her status of waiting for a bed.  Son is going home to check on his father who has dementia as well. RN gave permission for another family member to sit w/ pt due to family juggling husband and wife.

## 2020-04-19 NOTE — ED Notes (Signed)
Sherri Pratt made aware of HR, no new orders

## 2020-04-20 DIAGNOSIS — L8990 Pressure ulcer of unspecified site, unspecified stage: Secondary | ICD-10-CM | POA: Insufficient documentation

## 2020-04-20 LAB — GLUCOSE, CAPILLARY
Glucose-Capillary: 76 mg/dL (ref 70–99)
Glucose-Capillary: 125 mg/dL — ABNORMAL HIGH (ref 70–99)
Glucose-Capillary: 196 mg/dL — ABNORMAL HIGH (ref 70–99)
Glucose-Capillary: 95 mg/dL (ref 70–99)

## 2020-04-20 LAB — BASIC METABOLIC PANEL
Anion gap: 9 (ref 5–15)
BUN: 35 mg/dL — ABNORMAL HIGH (ref 8–23)
CO2: 25 mmol/L (ref 22–32)
Calcium: 8.4 mg/dL — ABNORMAL LOW (ref 8.9–10.3)
Chloride: 108 mmol/L (ref 98–111)
Creatinine, Ser: 0.94 mg/dL (ref 0.44–1.00)
GFR calc Af Amer: >60 mL/min (ref >60)
GFR calc non Af Amer: 59 mL/min — ABNORMAL LOW (ref >60)
Glucose, Bld: 142 mg/dL — ABNORMAL HIGH (ref 70–99)
Potassium: 3.4 mmol/L — ABNORMAL LOW (ref 3.5–5.1)
Sodium: 142 mmol/L (ref 135–145)

## 2020-04-20 MED ORDER — DOXYCYCLINE HYCLATE 100 MG PO TABS
100.0000 mg | ORAL_TABLET | Freq: Two times a day (BID) | ORAL | Status: AC
  Administered 2020-04-20 – 2020-04-23 (×8): 100 mg via ORAL
  Filled 2020-04-20 (×8): qty 1

## 2020-04-20 NOTE — Progress Notes (Signed)
PHARMACIST - PHYSICIAN COMMUNICATION  CONCERNING: Antibiotic IV to Oral Route Change Policy  RECOMMENDATION: This patient is receiving doxycycline by the intravenous route.  Based on criteria approved by the Pharmacy and Therapeutics Committee, the antibiotic(s) is/are being converted to the equivalent oral dose form(s).   DESCRIPTION: These criteria include:  Patient being treated for a respiratory tract infection, urinary tract infection, cellulitis or clostridium difficile associated diarrhea if on metronidazole  The patient is not neutropenic and does not exhibit a GI malabsorption state  The patient is eating (either orally or via tube) and/or has been taking other orally administered medications for a least 24 hours  The patient is improving clinically and has a Tmax < 100.5  If you have questions about this conversion, please contact the Pharmacy Department  []  ( [phone] )  [place] []  ( [phone] )  [facility] []  ( [phone]  [place] []  ( [phone] )  [facility] [x]  ( [phone] )  [facility]  

## 2020-04-20 NOTE — Plan of Care (Signed)
Pt VS WNL this am.  No complaints at this time.   Problem: Education: Goal: Knowledge of General Education information will improve Description: Including pain rating scale, medication(s)/side effects and non-pharmacologic comfort measures Outcome: Progressing   Problem: Health Behavior/Discharge Planning: Goal: Ability to manage health-related needs will improve Outcome: Progressing   Problem: Clinical Measurements: Goal: Ability to maintain clinical measurements within normal limits will improve Outcome: Progressing Goal: Will remain free from infection Outcome: Progressing Goal: Diagnostic test results will improve Outcome: Progressing Goal: Respiratory complications will improve Outcome: Progressing Goal: Cardiovascular complication will be avoided Outcome: Progressing   Problem: Nutrition: Goal: Adequate nutrition will be maintained Outcome: Progressing   Problem: Coping: Goal: Level of anxiety will decrease Outcome: Progressing   Problem: Elimination: Goal: Will not experience complications related to bowel motility Outcome: Progressing Goal: Will not experience complications related to urinary retention Outcome: Progressing   Problem: Pain Managment: Goal: General experience of comfort will improve Outcome: Progressing   Problem: Safety: Goal: Ability to remain free from injury will improve Outcome: Progressing   Problem: Activity: Goal: Risk for activity intolerance will decrease Outcome: Not Progressing   Problem: Skin Integrity: Goal: Risk for impaired skin integrity will decrease Outcome: Not Progressing

## 2020-04-20 NOTE — Consult Note (Addendum)
WOC Nurse Consult Note: Reason for Consult: Consult requested for abd/perineum and back, right hip and thighs.  Pt states she was incontinent of urine and sat in a recliner for a prolonged period of time prior to admission.  Wound type: Perineum and abd/thigh skin folds are red, moist and macerated with dry scabbed peeling edges; appearance is consistent with moisture associated skin damage.   Multiple areas of yellow fluid filled blisters have ruptured and evolved into red, moist partial thickness stage 2 pressure injuries in patchy areas.  Affected locations include: Right upper back; 5X5X.1cm, right outer hip 6X6X.1cm, right buttock 8X8X.1cm, right outer thigh 5X5X.1cm and 3X3X.1cm, Right lower inner hip/groin 3X3X.1cm Pressure Injury POA: Yes Dressing procedure/placement/frequency: Topical treatment orders provided for staff nurses to perform to protect and promote healing as follows: Apply Nystatin powder topically to perineum and abd/thigh folds BID and PRN when turning and cleaning. Foam dressings to stage 2 pressure injuries, change Q 3 days or PRN soiling.  Please re-consult if further assistance is needed.  Thank-you,  Cammie Mcgee MSN, RN, CWOCN, [place], CNS [phone]

## 2020-04-20 NOTE — Evaluation (Signed)
Physical Therapy Evaluation Patient Details Name: Sherri Pratt MRN: [id number] DOB: [birth year] Today's Date: 04/20/2020   History of Present Illness  Pt admitted from home with AMS, sepsis 2* RLL PNA, and acute metabolic encephalopathy 2* sepsis.    Clinical Impression  Pt admitted as above and presenting with functional mobility limitations 2* generalized weakness, obesity, balance deficits and questionable insight into current deficits.  Pt would benefit from follow up rehab at SNF level to maximize IND and safety prior to return home.    Follow Up Recommendations SNF    Equipment Recommendations  None recommended by PT    Recommendations for Other Services OT consult     Precautions / Restrictions Precautions Precautions: Fall Restrictions Weight Bearing Restrictions: No      Mobility  Bed Mobility Overal bed mobility: Needs Assistance Bed Mobility: Supine to Sit     Supine to sit: Mod assist     General bed mobility comments: Pt requiring min assist to bring trunk upright from elevated HOB but mod assist with pad to complete transition so EOB sitting.  Initial min assist for balance sitting EOB  Transfers Overall transfer level: Needs assistance Equipment used: Rolling walker (2 wheeled) Transfers: Sit to/from Stand Sit to Stand: Min assist;+2 physical assistance;+2 safety/equipment;From elevated surface         General transfer comment: cues for safe transition position, use of UEs to self assist; physical assist to bring wt up and fwd and to balance in standing with RW  Ambulation/Gait Ambulation/Gait assistance: Min assist;+2 physical assistance;+2 safety/equipment Gait Distance (Feet): 180 Feet Assistive device: Rolling walker (2 wheeled) Gait Pattern/deviations: Step-through pattern;Decreased step length - right;Decreased step length - left;Shuffle;Trunk flexed;Staggering left;Staggering right     General Gait Details: cues for posture, position from RW  and safety awareness.  Physical assist to manage RW, and for balance - pt unsteady throughout with severl LOB requiring assist to correct and avoid falling.Sherri Pratt  Stairs            Wheelchair Mobility    Modified Rankin (Stroke Patients Only)       Balance Overall balance assessment: Needs assistance Sitting-balance support: Feet supported;Bilateral upper extremity supported Sitting balance-Leahy Scale: Fair     Standing balance support: Bilateral upper extremity supported Standing balance-Leahy Scale: Poor                               Pertinent Vitals/Pain Pain Assessment: No/denies pain    Home Living Family/patient expects to be discharged to:: Unsure Living Arrangements: Spouse/significant other               Additional Comments: Pt lives with spouse who suffers dementia.      Prior Function Level of Independence: Independent;Independent with assistive device(s)         Comments: Pt states she used RW and SPC as needed     Hand Dominance   Dominant Hand: Right    Extremity/Trunk Assessment   Upper Extremity Assessment Upper Extremity Assessment: Generalized weakness    Lower Extremity Assessment Lower Extremity Assessment: Generalized weakness       Communication   Communication: No difficulties  Cognition Arousal/Alertness: Awake/alert Behavior During Therapy: WFL for tasks assessed/performed Overall Cognitive Status: No family/caregiver present to determine baseline cognitive functioning  General Comments: Pt states she is ready to go home - limited insight into current deficits      General Comments      Exercises Total Joint Exercises Ankle Circles/Pumps: AROM;Both;15 reps;Supine   Assessment/Plan    PT Assessment Patient needs continued PT services  PT Problem List Decreased strength;Decreased activity tolerance;Decreased balance;Decreased mobility;Decreased  cognition;Decreased knowledge of use of DME;Obesity       PT Treatment Interventions DME instruction;Gait training;Stair training;Functional mobility training;Therapeutic activities;Therapeutic exercise;Patient/family education;Balance training    PT Goals (Current goals can be found in the Care Plan section)  Acute Rehab PT Goals Patient Stated Goal: HOME PT Goal Formulation: With patient Time For Goal Achievement: 05/04/20 Potential to Achieve Goals: Fair    Frequency Min 3X/week   Barriers to discharge Decreased caregiver support Pt lives with spouse who had dementia    Co-evaluation               AM-PAC PT "6 Clicks" Mobility  Outcome Measure Help needed turning from your back to your side while in a flat bed without using bedrails?: A Little Help needed moving from lying on your back to sitting on the side of a flat bed without using bedrails?: A Lot Help needed moving to and from a bed to a chair (including a wheelchair)?: A Lot Help needed standing up from a chair using your arms (e.g., wheelchair or bedside chair)?: A Lot Help needed to walk in hospital room?: A Lot Help needed climbing 3-5 steps with a railing? : A Lot 6 Click Score: 13    End of Session Equipment Utilized During Treatment: Gait belt Activity Tolerance: Patient tolerated treatment well;Patient limited by fatigue Patient left: in chair;with call bell/phone within reach;with chair alarm set Nurse Communication: Mobility status PT Visit Diagnosis: Unsteadiness on feet (R26.81);Muscle weakness (generalized) (M62.81);Difficulty in walking, not elsewhere classified (R26.2)    Time: 1610-9604 PT Time Calculation (min) (ACUTE ONLY): 23 min   Charges:   PT Evaluation $PT Eval Low Complexity: 1 Low PT Treatments $Gait Training: 8-22 mins        Mauro Kaufmann PT Acute Rehabilitation Services Pager [phone] Office [phone]   Sherri Pratt 04/20/2020, 12:07 PM

## 2020-04-20 NOTE — Care Management Important Message (Signed)
Important Message  Patient Details IM Letter given to Sandford Craze RN Case Manager to present to the Patient Name: Sherri Pratt MRN: [id number] Date of Birth: May 11, 1946   Medicare Important Message Given:  Yes     Caren Macadam 04/20/2020, 9:48 AM

## 2020-04-20 NOTE — Progress Notes (Signed)
PROGRESS NOTE    Sherri Pratt  EZM:[id number] DOB: [birth year] DOA: 04/18/2020 PCP: [name], NP      Brief Narrative:  Sherri Pratt is a 74 y.o. F with obesity, A. fib on Eliquis, CKD IIIb, HTN, DM, and Parkinson's disease who presented with confusion.  Evidently the patient had a few days of progressive poor oral intake, fever, and decreased mentation.  Finally she was found by her son somnolent, with generalized weakness and malaise.  In the ER, CXR showed RLL infiltrate.,  Leukocytosis, and encephalopathy.  She was started on empiric antibiotics in the hospital service were asked to evaluate.       Assessment & Plan:  Sepsis due to right lower lobe pneumonia Acute metabolic encephalopathy due to sepsis Patient presented with fever, leukocytosis, and encephalopathy in setting of right lower lobe pneumonia.  Improving.  Blood cultures negative. -Continue ceftriaxone and doxycycline -Follow blood cultures urine antigen   Paroxysmal atrial fibrillation Rate controlled -Continue metoprolol, Eliquis  Prolonged QT interval Mag and K normal.  QT interval resolved on ECG with this morning.  Reviewed, no ST changes, ischemic changes, QTC down to 466.  AKI on CKD stage IIIa Creatinine 1.3 on admission, improved to 0.9 today after IV fluids, suspect this is her baseline.     Hypertension Blood pressure normal -Continue metoprolol  Type 2 diabetes, non-insulin-dependent, no complications Glucose well controlled Hemoglobin A1c 7% -Hold home glipizide -Continue sliding scale correction insulin  Parkinson's disease -Continue Sinemet  Gout -Hold allopurinol  Hyponatremia Dehydration Improving -Stop IV fluids  Pressure injury right thigh, stage II, POA -Consult WOC        Disposition: Status is: Inpatient  Remains inpatient appropriate because: The patient's severity of illness, multiple electrolyte derangements, encephalopathy indicate a severe  underlying pneumonia, and I am feel she needs continued IV antibiotics, possibly home tomorrow   Dispo: The patient is from: Home              Anticipated d/c is to: SNF              Anticipated d/c date is: 1 day              Patient currently is not medically stable to d/c.    Patient improving.  PT eval today, possibly home or to SNF tomorrow          MDM: The below labs and imaging reports reviewed and summarized above.  Medication management as above.    DVT prophylaxis:  apixaban (ELIQUIS) tablet 5 mg  Code Status: FULL Family Communication: son Barbara Cower by phone    Consultants:   Procedures:     Antimicrobials:   Cefepime and flagyl and vancomycin x1  Ceftriaxone and doxycycline 7/8 >>   Culture data:   7/7 blood cultures x2 -- ngtd           Subjective: No new fever, cough is improving.  No leg swelling, orthopnea.  No vomiting, diarrhea  Objective: Vitals:   04/20/20 0233 04/20/20 0638 04/20/20 0647 04/20/20 0944  BP: (!) 142/120 (!) 109/51  128/62  Pulse: (!) 50 95  62  Resp: 20 18    Temp: 97.9 F (36.6 C) 98.5 F (36.9 C)    TempSrc: Oral Oral    SpO2: 94% 98%    Weight:   93.2 kg   Height:        Intake/Output Summary (Last 24 hours) at 04/20/2020 1058 Last data filed at 04/20/2020 5035 Gross  per 24 hour  Intake 2036.9 ml  Output 1350 ml  Net 686.9 ml   Filed Weights   04/19/20 0800 04/19/20 1456 04/20/20 0647  Weight: 88.9 kg 89.7 kg 93.2 kg    Examination: General appearance: Elderly adult female, sitting up in recliner, interactive, no obvious distress, appears tired     HEENT: Anicteric, conjunctive are pink, lids and lashes normal.  No nasal deformity, discharge, epistaxis, oropharynx moist, no oral lesions, hearing normal Skin: Skin warm and dry, no suspicious rashes or lesions Cardiac: RRR, no murmurs, no lower extremity edema. Respiratory: Respiratory effort appears normal, no rales appreciated, no  wheezing. Abdomen: Abdomen soft without tenderness palpation or guarding, no ascites or distention. MSK:  Neuro: Extraocular movements intact, moves all extremities with generalized weakness, but normal strength, speech fluent. Psych: Attention improved, affect pleasant, judgment insight appear mildly impaired.         Data Reviewed: I have personally reviewed following labs and imaging studies:  CBC: Recent Labs  Lab 04/18/20 1703 04/19/20 0349  WBC 12.6* 9.3  NEUTROABS 11.3* 7.3  HGB 12.2 11.3*  HCT 38.3 37.1  MCV 85.9 89.0  PLT 222 197   Basic Metabolic Panel: Recent Labs  Lab 04/18/20 1703 04/19/20 0349  NA 146* 148*  K 3.9 4.0  CL 114* 119*  CO2 20* 19*  GLUCOSE 297* 180*  BUN 55* 49*  CREATININE 1.42* 1.30*  CALCIUM 9.0 8.9  MG  --  2.2   GFR: Estimated Creatinine Clearance: 42.2 mL/min (A) (by C-G formula based on SCr of 1.3 mg/dL (H)). Liver Function Tests: Recent Labs  Lab 04/18/20 1703 04/19/20 0349  AST 28 27  ALT 20 17  ALKPHOS 65 54  BILITOT 0.7 1.1  PROT 7.0 6.2*  ALBUMIN 3.5 3.2*   Recent Labs  Lab 04/18/20 1703  LIPASE 26   No results for input(s): AMMONIA in the last 168 hours. Coagulation Profile: Recent Labs  Lab 04/18/20 1752  INR 1.3*   Cardiac Enzymes: No results for input(s): CKTOTAL, CKMB, CKMBINDEX, TROPONINI in the last 168 hours. BNP (last 3 results) No results for input(s): PROBNP in the last 8760 hours. HbA1C: Recent Labs    04/19/20 0349  HGBA1C 7.0*   CBG: Recent Labs  Lab 04/19/20 0742 04/19/20 1157 04/19/20 1636 04/19/20 2015 04/20/20 0729  GLUCAP 151* 142* 100* 135* 76   Lipid Profile: No results for input(s): CHOL, HDL, LDLCALC, TRIG, CHOLHDL, LDLDIRECT in the last 72 hours. Thyroid Function Tests: Recent Labs    04/19/20 0349  TSH 0.923   Anemia Panel: No results for input(s): VITAMINB12, FOLATE, FERRITIN, TIBC, IRON, RETICCTPCT in the last 72 hours. Urine analysis:    Component  Value Date/Time   COLORURINE YELLOW 04/18/2020 1755   APPEARANCEUR CLEAR 04/18/2020 1755   LABSPEC 1.025 04/18/2020 1755   PHURINE 5.0 04/18/2020 1755   GLUCOSEU 150 (A) 04/18/2020 1755   HGBUR NEGATIVE 04/18/2020 1755   HGBUR negative 11/04/2010 0921   BILIRUBINUR NEGATIVE 04/18/2020 1755   BILIRUBINUR 1+ 09/27/2018 1156   KETONESUR NEGATIVE 04/18/2020 1755   PROTEINUR 30 (A) 04/18/2020 1755   UROBILINOGEN 1.0 09/27/2018 1156   UROBILINOGEN 0.2 11/04/2010 0921   NITRITE NEGATIVE 04/18/2020 1755   LEUKOCYTESUR NEGATIVE 04/18/2020 1755   Sepsis Labs: @LABRCNTIP (procalcitonin:4,lacticacidven:4)  ) Recent Results (from the past 240 hour(s))  Culture, blood (Routine X 2) w Reflex to ID Panel     Status: None (Preliminary result)   Collection Time: 04/18/20  6:10 PM  Specimen: BLOOD RIGHT HAND  Result Value Ref Range Status   Specimen Description BLOOD RIGHT HAND  Final   Special Requests   Final    BOTTLES DRAWN AEROBIC AND ANAEROBIC Blood Culture adequate volume   Culture   Final    NO GROWTH < 24 HOURS Performed at [facility], [address], [place], [place] [address]    Report Status PENDING  Incomplete  Culture, blood (Routine X 2) w Reflex to ID Panel     Status: None (Preliminary result)   Collection Time: 04/18/20  6:18 PM   Specimen: BLOOD LEFT HAND  Result Value Ref Range Status   Specimen Description BLOOD LEFT HAND  Final   Special Requests   Final    BOTTLES DRAWN AEROBIC AND ANAEROBIC Blood Culture adequate volume   Culture   Final    NO GROWTH < 24 HOURS Performed at [facility], [address] [place], [place] [address]    Report Status PENDING  Incomplete  SARS Coronavirus 2 by RT PCR (hospital order, performed in [facility] hospital lab) Nasopharyngeal Nasopharyngeal Swab     Status: None   Collection Time: 04/18/20  9:09 PM   Specimen: Nasopharyngeal Swab  Result Value Ref Range Status   SARS Coronavirus 2 NEGATIVE NEGATIVE Final     Comment: (NOTE) SARS-CoV-2 target nucleic acids are NOT DETECTED.  The SARS-CoV-2 RNA is generally detectable in upper and lower respiratory specimens during the acute phase of infection. The lowest concentration of SARS-CoV-2 viral copies this assay can detect is 250 copies / mL. A negative result does not preclude SARS-CoV-2 infection and should not be used as the sole basis for treatment or other patient management decisions.  A negative result may occur with improper specimen collection / handling, submission of specimen other than nasopharyngeal swab, presence of viral mutation(s) within the areas targeted by this assay, and inadequate number of viral copies (<250 copies / mL). A negative result must be combined with clinical observations, patient history, and epidemiological information.  Fact Sheet for Patients:   BoilerBrush.com.cy  Fact Sheet for Healthcare Providers: https://pope.com/  This test is not yet approved or  cleared by the [place] FDA and has been authorized for detection and/or diagnosis of SARS-CoV-2 by FDA under an Emergency Use Authorization (EUA).  This EUA will remain in effect (meaning this test can be used) for the duration of the COVID-19 declaration under Section 564(b)(1) of the Act, 21 U.S.C. section 360bbb-3(b)(1), unless the authorization is terminated or revoked sooner.  Performed at [facility], [address] [place], [place] [address]          Radiology Studies: CT Head Wo Contrast  Result Date: 04/18/2020 CLINICAL DATA:  74 year old female with altered mental status. EXAM: CT HEAD WITHOUT CONTRAST TECHNIQUE: Contiguous axial images were obtained from the base of the skull through the vertex without intravenous contrast. COMPARISON:  Head CT dated 04/15/2017. FINDINGS: Brain: Mild age-related atrophy and chronic microvascular ischemic changes. There is no acute intracranial  hemorrhage. No mass effect or midline shift no extra-axial fluid collection. Vascular: No hyperdense vessel or unexpected calcification. Skull: Normal. Negative for fracture or focal lesion. Sinuses/Orbits: No acute finding. Other: None IMPRESSION: 1. No acute intracranial pathology. 2. Mild age-related atrophy and chronic microvascular ischemic changes. Electronically Signed   By: Elgie Collard M.D.   On: 04/18/2020 20:43   DG Chest Port 1 View  Result Date: 04/18/2020 CLINICAL DATA:  Failure to thrive. EXAM: PORTABLE CHEST 1 VIEW COMPARISON:  October 12, 2018. FINDINGS: Stable cardiomediastinal silhouette. No pneumothorax or significant pleural effusion is noted. Left lung is clear. Mild right basilar atelectasis or infiltrate is noted. Bony thorax is unremarkable. IMPRESSION: Mild right basilar subsegmental atelectasis or infiltrate. Electronically Signed   By: Lupita Raider M.D.   On: 04/18/2020 17:02        Scheduled Meds: . apixaban  5 mg Oral BID  . carbidopa-levodopa  2 tablet Oral BID WC   And  . carbidopa-levodopa  1 tablet Oral q1600  . doxycycline  100 mg Oral Q12H  . insulin aspart  0-5 Units Subcutaneous QHS  . insulin aspart  0-9 Units Subcutaneous TID WC  . metoprolol tartrate  12.5 mg Oral BID  . nystatin   Topical BID  . simvastatin  20 mg Oral QHS  . sodium chloride flush  3 mL Intravenous Q12H   Continuous Infusions: . cefTRIAXone (ROCEPHIN)  IV 2 g (04/20/20 0949)  . lactated ringers 75 mL/hr at 04/20/20 0511     LOS: 2 days    Time spent: 25 minutes    Alberteen Sam, MD [facility] 04/20/2020, 10:58 AM     Please page though AMION or Epic secure chat:  For [employer], Higher education careers adviser

## 2020-04-21 LAB — COMPREHENSIVE METABOLIC PANEL
ALT: 13 U/L (ref 0–44)
AST: 25 U/L (ref 15–41)
Albumin: 3.3 g/dL — ABNORMAL LOW (ref 3.5–5.0)
Alkaline Phosphatase: 50 U/L (ref 38–126)
Anion gap: 5 (ref 5–15)
BUN: 26 mg/dL — ABNORMAL HIGH (ref 8–23)
CO2: 26 mmol/L (ref 22–32)
Calcium: 8.3 mg/dL — ABNORMAL LOW (ref 8.9–10.3)
Chloride: 105 mmol/L (ref 98–111)
Creatinine, Ser: 0.9 mg/dL (ref 0.44–1.00)
GFR calc Af Amer: >60 mL/min (ref >60)
GFR calc non Af Amer: >60 mL/min (ref >60)
Glucose, Bld: 103 mg/dL — ABNORMAL HIGH (ref 70–99)
Potassium: 3.2 mmol/L — ABNORMAL LOW (ref 3.5–5.1)
Sodium: 136 mmol/L (ref 135–145)
Total Bilirubin: 0.6 mg/dL (ref 0.3–1.2)
Total Protein: 6.1 g/dL — ABNORMAL LOW (ref 6.5–8.1)

## 2020-04-21 LAB — GLUCOSE, CAPILLARY
Glucose-Capillary: 100 mg/dL — ABNORMAL HIGH (ref 70–99)
Glucose-Capillary: 169 mg/dL — ABNORMAL HIGH (ref 70–99)
Glucose-Capillary: 202 mg/dL — ABNORMAL HIGH (ref 70–99)
Glucose-Capillary: 114 mg/dL — ABNORMAL HIGH (ref 70–99)

## 2020-04-21 LAB — CBC
HCT: 34.5 % — ABNORMAL LOW (ref 36.0–46.0)
Hemoglobin: 11.1 g/dL — ABNORMAL LOW (ref 12.0–15.0)
MCH: 27.5 pg (ref 26.0–34.0)
MCHC: 32.2 g/dL (ref 30.0–36.0)
MCV: 85.4 fL (ref 80.0–100.0)
Platelets: 151 10*3/uL (ref 150–400)
RBC: 4.04 MIL/uL (ref 3.87–5.11)
RDW: 13.7 % (ref 11.5–15.5)
WBC: 5.9 10*3/uL (ref 4.0–10.5)
nRBC: 0 % (ref 0.0–0.2)

## 2020-04-21 MED ORDER — POTASSIUM CHLORIDE CRYS ER 20 MEQ PO TBCR
40.0000 meq | EXTENDED_RELEASE_TABLET | Freq: Once | ORAL | Status: AC
  Administered 2020-04-21: 40 meq via ORAL
  Filled 2020-04-21: qty 2

## 2020-04-21 MED ORDER — CEFDINIR 300 MG PO CAPS
300.0000 mg | ORAL_CAPSULE | Freq: Two times a day (BID) | ORAL | Status: AC
  Administered 2020-04-21 – 2020-04-23 (×6): 300 mg via ORAL
  Filled 2020-04-21 (×6): qty 1

## 2020-04-21 NOTE — Progress Notes (Signed)
PROGRESS NOTE    Sherri BiddingJudy C Pratt  JYN:[id number] DOB: [birth year] DOA: 04/18/2020 PCP: Shirline FreesNafziger, Cory, NP      Brief Narrative:  Sherri Pratt is a 74 y.o. F with obesity, A. fib on Eliquis, CKD IIIb, HTN, DM, and Parkinson's disease who presented with confusion.  Evidently the patient had a few days of progressive poor oral intake, fever, and decreased mentation.  Finally she was found by her son somnolent, with generalized weakness and malaise.  In the ER, CXR showed RLL infiltrate.,  Leukocytosis, and encephalopathy.  She was started on empiric antibiotics in the hospital service were asked to evaluate.       Assessment & Plan:  Sepsis due to right lower lobe pneumonia Acute metabolic encephalopathy due to sepsis Patient presented with fever, leukocytosis, and encephalopathy in setting of right lower lobe pneumonia.  qSOFA score 1.  Started on empiric antibiotics, and symptoms improved. Patient now mentating at baseline, taking orals.  Temp < 100 F, heart rate < 100bpm, RR < 24, SpO2 at baseline.   Stable for discharge.    -Transition to Cefpodoxime and doxycycline to complete 5 days   Paroxysmal atrial fibrillation Rate controlled -Continue metoprolol, Eliquis  Prolonged QT interval Mag and K normal.  QT interval resolved on ECG with this morning.  Reviewed, no ST changes, ischemic changes, QTC down to 466.  AKI on CKD stage III Creatinine 1.3 on admission, improved to 0.9 today after IV fluids, suspect this is her baseline.  Stable today at 0.9   Hypertension Blood pressure slightly elevated Continue metoprolol  Type 2 diabetes, non-insulin-dependent, no complications Glucose well controlled with minimal insulin Hemoglobin A1c 7% -Hold home glipizide -Continue sliding scale correction insulin  Parkinson's disease -Continue home Sinemet  Gout -Resume allopurinol  Hyponatremia Dehydration Resolved  Pressure injury right thigh, stage II, POA -Consult  WOC        Disposition: Status is: Inpatient  Remains inpatient appropriate because: The patient's severity of illness and multiple electrolyte derangements and encephalopathy indicated severe underlying pneumonia.  This is now improved to the point she is stable for transition to outpatient therapy, but she is still too weak to transition home.  We will pursue SNF placement    Dispo:  Patient From: Home  Planned Disposition: Skilled Nursing Facility  Expected discharge date: 04/21/20  Medically stable for discharge: Yes            MDM: The below labs and imaging reports reviewed and summarized above.  Medication management as above.    DVT prophylaxis:  apixaban (ELIQUIS) tablet 5 mg  Code Status: FULL Family Communication:      Consultants:   Procedures:     Antimicrobials:   Cefepime and flagyl and vancomycin x1  Ceftriaxone  7/8 >> 7/10  Doxycycline 7/8 >>   Cefdinir 7/10 >>  Culture data:   7/7 blood cultures x2 -- ngtd           Subjective: No new fever.  Cough resolved.  No leg swelling, orthopnea, confusion.  Feeling well.  No dyspnea.  Objective: Vitals:   04/20/20 0944 04/20/20 1503 04/20/20 2012 04/21/20 0540  BP: 128/62 (!) 134/58 (!) 149/109 (!) 149/68  Pulse: 62 (!) 59 62 (!) 57  Resp:  18 19 20   Temp:  97.8 F (36.6 C) 98.7 F (37.1 C) 98.4 F (36.9 C)  TempSrc:  Oral Oral Oral  SpO2:  98% 97% 99%  Weight:      Height:  Intake/Output Summary (Last 24 hours) at 04/21/2020 0807 Last data filed at 04/20/2020 2037 Gross per 24 hour  Intake 1500 ml  Output 850 ml  Net 650 ml   Filed Weights   04/19/20 0800 04/19/20 1456 04/20/20 0647  Weight: 88.9 kg 89.7 kg 93.2 kg    Examination: General appearance: Obese elderly female, lying in bed, sleeping but easily arousable and interactive.     HEENT:    Skin:  Cardiac: RRR, no murmurs, no lower extremity edema Respiratory: Respiratory effort easy and  unlabored, no rales, no wheezing. Abdomen: Abdomen soft without distention, tenderness palpation or guarding MSK:  Neuro: Extraocular movements intact, moves all extremities with generalized weakness, but symmetric strength, speech fluent. Psych: Attention normal, affect pleasant, judgment insight appear normal.  Sleeping but easily arousable.           Data Reviewed: I have personally reviewed following labs and imaging studies:  CBC: Recent Labs  Lab 04/18/20 1703 04/19/20 0349 04/21/20 0603  WBC 12.6* 9.3 5.9  NEUTROABS 11.3* 7.3  --   HGB 12.2 11.3* 11.1*  HCT 38.3 37.1 34.5*  MCV 85.9 89.0 85.4  PLT 222 197 151   Basic Metabolic Panel: Recent Labs  Lab 04/18/20 1703 04/19/20 0349 04/20/20 1123 04/21/20 0603  NA 146* 148* 142 136  K 3.9 4.0 3.4* 3.2*  CL 114* 119* 108 105  CO2 20* 19* 25 26  GLUCOSE 297* 180* 142* 103*  BUN 55* 49* 35* 26*  CREATININE 1.42* 1.30* 0.94 0.90  CALCIUM 9.0 8.9 8.4* 8.3*  MG  --  2.2  --   --    GFR: Estimated Creatinine Clearance: 61 mL/min (by C-G formula based on SCr of 0.9 mg/dL). Liver Function Tests: Recent Labs  Lab 04/18/20 1703 04/19/20 0349 04/21/20 0603  AST 28 27 25   ALT 20 17 13   ALKPHOS 65 54 50  BILITOT 0.7 1.1 0.6  PROT 7.0 6.2* 6.1*  ALBUMIN 3.5 3.2* 3.3*   Recent Labs  Lab 04/18/20 1703  LIPASE 26   No results for input(s): AMMONIA in the last 168 hours. Coagulation Profile: Recent Labs  Lab 04/18/20 1752  INR 1.3*   Cardiac Enzymes: No results for input(s): CKTOTAL, CKMB, CKMBINDEX, TROPONINI in the last 168 hours. BNP (last 3 results) No results for input(s): PROBNP in the last 8760 hours. HbA1C: Recent Labs    04/19/20 0349  HGBA1C 7.0*   CBG: Recent Labs  Lab 04/20/20 0729 04/20/20 1137 04/20/20 1702 04/20/20 2035 04/21/20 0736  GLUCAP 76 125* 196* 95 100*   Lipid Profile: No results for input(s): CHOL, HDL, LDLCALC, TRIG, CHOLHDL, LDLDIRECT in the last 72  hours. Thyroid Function Tests: Recent Labs    04/19/20 0349  TSH 0.923   Anemia Panel: No results for input(s): VITAMINB12, FOLATE, FERRITIN, TIBC, IRON, RETICCTPCT in the last 72 hours. Urine analysis:    Component Value Date/Time   COLORURINE YELLOW 04/18/2020 1755   APPEARANCEUR CLEAR 04/18/2020 1755   LABSPEC 1.025 04/18/2020 1755   PHURINE 5.0 04/18/2020 1755   GLUCOSEU 150 (A) 04/18/2020 1755   HGBUR NEGATIVE 04/18/2020 1755   HGBUR negative 11/04/2010 0921   BILIRUBINUR NEGATIVE 04/18/2020 1755   BILIRUBINUR 1+ 09/27/2018 1156   KETONESUR NEGATIVE 04/18/2020 1755   PROTEINUR 30 (A) 04/18/2020 1755   UROBILINOGEN 1.0 09/27/2018 1156   UROBILINOGEN 0.2 11/04/2010 0921   NITRITE NEGATIVE 04/18/2020 1755   LEUKOCYTESUR NEGATIVE 04/18/2020 1755   Sepsis Labs: @LABRCNTIP (procalcitonin:4,lacticacidven:4)  ) Recent Results (  from the past 240 hour(s))  Culture, blood (Routine X 2) w Reflex to ID Panel     Status: None (Preliminary result)   Collection Time: 04/18/20  6:10 PM   Specimen: BLOOD RIGHT HAND  Result Value Ref Range Status   Specimen Description BLOOD RIGHT HAND  Final   Special Requests   Final    BOTTLES DRAWN AEROBIC AND ANAEROBIC Blood Culture adequate volume   Culture   Final    NO GROWTH 2 DAYS Performed at [facility], [address] [place], [place] [address]    Report Status PENDING  Incomplete  Culture, blood (Routine X 2) w Reflex to ID Panel     Status: None (Preliminary result)   Collection Time: 04/18/20  6:18 PM   Specimen: BLOOD LEFT HAND  Result Value Ref Range Status   Specimen Description BLOOD LEFT HAND  Final   Special Requests   Final    BOTTLES DRAWN AEROBIC AND ANAEROBIC Blood Culture adequate volume   Culture   Final    NO GROWTH 2 DAYS Performed at [facility], [address], [place], [place] [address]    Report Status PENDING  Incomplete  SARS Coronavirus 2 by RT PCR (hospital order, performed in [facility]  hospital lab) Nasopharyngeal Nasopharyngeal Swab     Status: None   Collection Time: 04/18/20  9:09 PM   Specimen: Nasopharyngeal Swab  Result Value Ref Range Status   SARS Coronavirus 2 NEGATIVE NEGATIVE Final    Comment: (NOTE) SARS-CoV-2 target nucleic acids are NOT DETECTED.  The SARS-CoV-2 RNA is generally detectable in upper and lower respiratory specimens during the acute phase of infection. The lowest concentration of SARS-CoV-2 viral copies this assay can detect is 250 copies / mL. A negative result does not preclude SARS-CoV-2 infection and should not be used as the sole basis for treatment or other patient management decisions.  A negative result may occur with improper specimen collection / handling, submission of specimen other than nasopharyngeal swab, presence of viral mutation(s) within the areas targeted by this assay, and inadequate number of viral copies (<250 copies / mL). A negative result must be combined with clinical observations, patient history, and epidemiological information.  Fact Sheet for Patients:   BoilerBrush.com.cy  Fact Sheet for Healthcare Providers: https://pope.com/  This test is not yet approved or  cleared by the [place] FDA and has been authorized for detection and/or diagnosis of SARS-CoV-2 by FDA under an Emergency Use Authorization (EUA).  This EUA will remain in effect (meaning this test can be used) for the duration of the COVID-19 declaration under Section 564(b)(1) of the Act, 21 U.S.C. section 360bbb-3(b)(1), unless the authorization is terminated or revoked sooner.  Performed at [facility], [address], [place], [place] [address]          Radiology Studies: No results found.      Scheduled Meds: . apixaban  5 mg Oral BID  . carbidopa-levodopa  2 tablet Oral BID WC   And  . carbidopa-levodopa  1 tablet Oral q1600  . cefdinir  300 mg Oral Q12H  .  doxycycline  100 mg Oral Q12H  . insulin aspart  0-5 Units Subcutaneous QHS  . insulin aspart  0-9 Units Subcutaneous TID WC  . metoprolol tartrate  12.5 mg Oral BID  . nystatin   Topical BID  . potassium chloride  40 mEq Oral Once  . simvastatin  20 mg Oral  QHS  . sodium chloride flush  3 mL Intravenous Q12H   Continuous Infusions:    LOS: 3 days    Time spent: 25 minutes    Alberteen Sam, MD [facility] 04/21/2020, 8:07 AM     Please page though AMION or Epic secure chat:  For [employer], Higher education careers adviser

## 2020-04-21 NOTE — [place] FL2 (Addendum)
[place] MEDICAID FL2 LEVEL OF CARE SCREENING TOOL     IDENTIFICATION  Patient Name: Sherri Pratt Birthdate: 03-10-1946 Sex: female Admission Date (Current Location): 04/18/2020  [facility] and [place] Number:  Producer, television/film/video and Address:  [facility],  [address] [place], [place] [address]      Provider Number: [id number]  Attending Physician Name and Address:  Alberteen Sam, *  Relative Name and Phone Number:  Shalin Linders, husband,[phone]    Current Level of Care: Hospital Recommended Level of Care: Skilled Nursing Facility Prior Approval Number:    Date Approved/Denied:   PASRR Number: [id number]  Discharge Plan: SNF    Current Diagnoses: Patient Active Problem List   Diagnosis Date Noted  . Pressure injury of skin 04/20/2020  . Sepsis (HCC) 04/19/2020  . Severe sepsis (HCC) 04/18/2020  . CAP (community acquired pneumonia) 04/18/2020  . General weakness 04/18/2020  . Acute metabolic encephalopathy 04/18/2020  . Mild obesity 11/12/2018  . Parkinson's disease (HCC) 10/19/2018  . Prolonged QT interval 10/13/2018  . Hypokalemia 10/13/2018  . SDH (subdural hematoma) (HCC) 10/13/2018  . Syncope and collapse 10/12/2018  . Type 2 diabetes mellitus with hypoglycemia without coma (HCC) 10/12/2018  . Paroxysmal atrial fibrillation (HCC) 04/17/2017  . Hypophosphatemia 04/17/2017  . Hypomagnesemia 04/17/2017  . Acute lower UTI 04/17/2017  . Hyperthyroidism 04/17/2017  . AKI (acute kidney injury) (HCC) 04/16/2017  . Anemia 11/08/2012  . GOUT 11/12/2010  . Chronic kidney disease 03/27/2009  . Diabetes 1.5, managed as type 1 (HCC) 09/21/2007  . Mixed hyperlipidemia 09/21/2007  . Essential hypertension 09/21/2007    Orientation RESPIRATION BLADDER Height & Weight     Self, Situation, Place  Normal External catheter Weight: 93.2 kg Height:  5\' 5"  (165.1 cm)  BEHAVIORAL SYMPTOMS/MOOD NEUROLOGICAL BOWEL NUTRITION STATUS   (none)  (none)  Continent Diet (see d/c summary)  AMBULATORY STATUS COMMUNICATION OF NEEDS Skin   Extensive Assist Verbally  (Pressure Injuries: middle back, R buttocks,R hip, R posterior thigh, R shin)                       Personal Care Assistance Level of Assistance  Bathing, Feeding, Dressing Bathing Assistance: Maximum assistance Feeding assistance: Independent Dressing Assistance: Limited assistance     Functional Limitations Info  Sight, Hearing, Speech Sight Info: Adequate Hearing Info: Adequate Speech Info: Adequate    SPECIAL CARE FACTORS FREQUENCY  PT (By licensed PT)     PT Frequency: 5X/W              Contractures Contractures Info: Not present    Additional Factors Info  Code Status, Allergies Code Status Info: full Allergies Info: Peanuts           Current Medications (04/21/2020):  This is the current hospital active medication list Current Facility-Administered Medications  Medication Dose Route Frequency Provider Last Rate Last Admin  . acetaminophen (TYLENOL) tablet 650 mg  650 mg Oral Q6H PRN Howerter, Justin B, DO   650 mg at 04/21/20 1034   Or  . acetaminophen (TYLENOL) suppository 650 mg  650 mg Rectal Q6H PRN Howerter, Justin B, DO      . apixaban (ELIQUIS) tablet 5 mg  5 mg Oral BID Howerter, Justin B, DO   5 mg at 04/21/20 1034  . carbidopa-levodopa (SINEMET IR) 25-100 MG per tablet immediate release 2 tablet  2 tablet Oral BID WC Howerter, Justin B, DO   2 tablet at 04/21/20  1243   And  . carbidopa-levodopa (SINEMET IR) 25-100 MG per tablet immediate release 1 tablet  1 tablet Oral q1600 Howerter, Justin B, DO   1 tablet at 04/20/20 1733  . cefdinir (OMNICEF) capsule 300 mg  300 mg Oral Q12H Danford, Earl Lites, MD   300 mg at 04/21/20 1034  . doxycycline (VIBRA-TABS) tablet 100 mg  100 mg Oral Q12H Danford, Earl Lites, MD   100 mg at 04/21/20 1034  . insulin aspart (novoLOG) injection 0-5 Units  0-5 Units Subcutaneous QHS Howerter, Justin B,  DO      . insulin aspart (novoLOG) injection 0-9 Units  0-9 Units Subcutaneous TID WC Howerter, Justin B, DO   2 Units at 04/21/20 1243  . metoprolol tartrate (LOPRESSOR) tablet 12.5 mg  12.5 mg Oral BID Howerter, Justin B, DO   12.5 mg at 04/21/20 1034  . nystatin (MYCOSTATIN/NYSTOP) topical powder   Topical BID Alberteen Sam, MD   Given at 04/21/20 1034  . simvastatin (ZOCOR) tablet 20 mg  20 mg Oral QHS Howerter, Justin B, DO   20 mg at 04/20/20 2118  . sodium chloride flush (NS) 0.9 % injection 3 mL  3 mL Intravenous Q12H Howerter, Justin B, DO   3 mL at 04/21/20 1035     Discharge Medications: Please see discharge summary for a list of discharge medications.  Relevant Imaging Results:  Relevant Lab Results:   Additional Information SS#; [id number]  Ida Rogue, LCSW

## 2020-04-21 NOTE — Progress Notes (Signed)
Physical Therapy Treatment Patient Details Name: Sherri Pratt MRN: [id number] DOB: [birth year] Today's Date: 04/21/2020    History of Present Illness Pt admitted from home with AMS, sepsis 2* RLL PNA, and acute metabolic encephalopathy 2* sepsis.      PT Comments    Pt with progressed mobility this day, ambulating good hallway distance requiring less physical assist from PT overall for mobility. Pt remains a high fall risk with general unsteadiness noted throughout mobility, but no overt LOB today. Pt required PT assist for pericare, with skin breakdown noted along groin and thighs. PT continuing to recommend ST-SNF given pt's high fall risk and mobility deficits, as well as husband has dementia and children are not available 24/7. Per pt, she has plenty of assist from children and is adamant to d/c home. PT to continue to follow acutely.    Follow Up Recommendations  SNF;Other (comment) (vs home with HHPT and family support if pt refuses SNF)     Equipment Recommendations  None recommended by PT    Recommendations for Other Services OT consult     Precautions / Restrictions Precautions Precautions: Fall Restrictions Weight Bearing Restrictions: No    Mobility  Bed Mobility Overal bed mobility: Needs Assistance Bed Mobility: Supine to Sit     Supine to sit: Mod assist;HOB elevated     General bed mobility comments: Mod assist for trunk elevation off of bed, scooting to EOB with use of bed pad and lateral leaning.  Transfers Overall transfer level: Needs assistance Equipment used: Rolling walker (2 wheeled);1 person hand held assist Transfers: Sit to/from [employer] Sit to Stand: Min assist Stand pivot transfers: Min assist       General transfer comment: Min assist for power up, steadying, hand placement when rising and sitting. Sit to stand x2, first attempt with HHA for transfer to toilet requiring steadying assist and slow eccentric lower onto  BSC.  Ambulation/Gait Ambulation/Gait assistance: Min assist;Min guard Gait Distance (Feet): 155 Feet Assistive device: Rolling walker (2 wheeled) Gait Pattern/deviations: Step-through pattern;Decreased stride length;Shuffle;Trunk flexed;Wide base of support Gait velocity: decr   General Gait Details: min guard for safety, occasional min assist to steady. Verbal cuing for upright posture, placement inside RW.   Stairs Stairs:  (pt politely declines, states "my children will help me")           Wheelchair Mobility    Modified Rankin (Stroke Patients Only)       Balance Overall balance assessment: Needs assistance Sitting-balance support: Feet supported;Bilateral upper extremity supported Sitting balance-Leahy Scale: Fair     Standing balance support: Bilateral upper extremity supported Standing balance-Leahy Scale: Fair Standing balance comment: able to stand statically without external support                            Cognition Arousal/Alertness: Awake/alert Behavior During Therapy: WFL for tasks assessed/performed Overall Cognitive Status: No family/caregiver present to determine baseline cognitive functioning                                 General Comments: poor insight into mobility deficits, focused on d/c home      Exercises General Exercises - Lower Extremity Long Arc Quad: AROM;Both;15 reps;Seated Hip Flexion/Marching: AROM;Both;5 reps;Seated    General Comments General comments (skin integrity, edema, etc.): tremors most noticable in LUE, tardive dyskinesia-like motions  Pertinent Vitals/Pain Pain Assessment: No/denies pain    Home Living                      Prior Function            PT Goals (current goals can now be found in the care plan section) Acute Rehab PT Goals Patient Stated Goal: HOME PT Goal Formulation: With patient Time For Goal Achievement: 05/04/20 Potential to Achieve Goals:  Fair Progress towards PT goals: Progressing toward goals    Frequency    Min 3X/week      PT Plan Current plan remains appropriate    Co-evaluation              AM-PAC PT "6 Clicks" Mobility   Outcome Measure  Help needed turning from your back to your side while in a flat bed without using bedrails?: A Little Help needed moving from lying on your back to sitting on the side of a flat bed without using bedrails?: A Little Help needed moving to and from a bed to a chair (including a wheelchair)?: A Little Help needed standing up from a chair using your arms (e.g., wheelchair or bedside chair)?: A Little Help needed to walk in hospital room?: A Little Help needed climbing 3-5 steps with a railing? : A Lot 6 Click Score: 17    End of Session Equipment Utilized During Treatment: Gait belt Activity Tolerance: Patient tolerated treatment well;Patient limited by fatigue Patient left: in chair;with call bell/phone within reach;with chair alarm set   PT Visit Diagnosis: Unsteadiness on feet (R26.81);Muscle weakness (generalized) (M62.81);Difficulty in walking, not elsewhere classified (R26.2)     Time: 4163-8453 PT Time Calculation (min) (ACUTE ONLY): 28 min  Charges:  $Gait Training: 8-22 mins $Therapeutic Activity: 8-22 mins                     Jemila Camille E, PT Acute Rehabilitation Services Pager [phone]  Office [phone]   Cloys Vera D Despina Hidden 04/21/2020, 3:49 PM

## 2020-04-21 NOTE — TOC Initial Note (Signed)
Transition of Care [facility]) - Initial/Assessment Note    Patient Details  Name: Sherri Pratt MRN: [id number] Date of Birth: 11-26-1945  Transition of Care [facility]) CM/SW Contact:    Ida Rogue, LCSW Phone Number: 04/21/2020, 4:06 PM  Clinical Narrative:   Patient seen in response to PT recommendation fo SNF.  Ms Armentrout lives in [place] with her husband,  Has all needed DME at home and has been to rehab twice in the past, both times to [facility].  They are her first choice, and she was reluctant to talk about any other options. She finally agreed to allow me to send her information out to several other facilities as a back up plan. Bed search initiated.  TOC will continue to follow during the course of hospitalization.              Expected Discharge Plan: Skilled Nursing Facility Barriers to Discharge: SNF Pending bed offer   Patient Goals and CMS Choice   CMS Medicare.gov Compare Post Acute Care list provided to:: Patient Choice offered to / list presented to : Patient  Expected Discharge Plan and Services Expected Discharge Plan: Skilled Nursing Facility   Discharge Planning Services: CM Consult Post Acute Care Choice: Skilled Nursing Facility Living arrangements for the past 2 months: Single Family Home                                      Prior Living Arrangements/Services Living arrangements for the past 2 months: Single Family Home Lives with:: Spouse Patient language and need for interpreter reviewed:: Yes        Need for Family Participation in Patient Care: Yes (Comment) Care giver support system in place?: Yes (comment) Current home services: DME Criminal Activity/Legal Involvement Pertinent to Current Situation/Hospitalization: No - Comment as needed  Activities of Daily Living Home Assistive Devices/Equipment: Dan Humphreys (specify type) ADL Screening (condition at time of admission) Patient's cognitive ability adequate to safely complete daily  activities?: Yes Is the patient deaf or have difficulty hearing?: No Does the patient have difficulty seeing, even when wearing glasses/contacts?: No Does the patient have difficulty concentrating, remembering, or making decisions?: Yes Patient able to express need for assistance with ADLs?: Yes Does the patient have difficulty dressing or bathing?: Yes Independently performs ADLs?: No Communication: Independent Dressing (OT): Needs assistance Is this a change from baseline?: Change from baseline, expected to last >3 days Grooming: Needs assistance Is this a change from baseline?: Change from baseline, expected to last >3 days Feeding: Independent Bathing: Needs assistance Is this a change from baseline?: Change from baseline, expected to last >3 days Toileting: Needs assistance Is this a change from baseline?: Change from baseline, expected to last >3days In/Out Bed: Needs assistance Is this a change from baseline?: Change from baseline, expected to last >3 days Walks in Home: Independent with device (comment) (walker) Does the patient have difficulty walking or climbing stairs?: Yes Weakness of Legs: Both Weakness of Arms/Hands: Left  Permission Sought/Granted                  Emotional Assessment Appearance:: Appears stated age Attitude/Demeanor/Rapport: Engaged Affect (typically observed): Appropriate Orientation: : Oriented to Self, Oriented to Place, Oriented to Situation Alcohol / Substance Use: Not Applicable Psych Involvement: No (comment)  Admission diagnosis:  Dehydration [E86.0] Failure to thrive in adult [R62.7] AKI (acute kidney injury) (HCC) [N17.9] Sepsis (HCC) [A41.9]  Severe sepsis (HCC) [A41.9, R65.20] Fever, unspecified fever cause [R50.9] Community acquired pneumonia of right lower lobe of lung [J18.9] Patient Active Problem List   Diagnosis Date Noted  . Pressure injury of skin 04/20/2020  . Sepsis (HCC) 04/19/2020  . Severe sepsis (HCC)  04/18/2020  . CAP (community acquired pneumonia) 04/18/2020  . General weakness 04/18/2020  . Acute metabolic encephalopathy 04/18/2020  . Mild obesity 11/12/2018  . Parkinson's disease (HCC) 10/19/2018  . Prolonged QT interval 10/13/2018  . Hypokalemia 10/13/2018  . SDH (subdural hematoma) (HCC) 10/13/2018  . Syncope and collapse 10/12/2018  . Type 2 diabetes mellitus with hypoglycemia without coma (HCC) 10/12/2018  . Paroxysmal atrial fibrillation (HCC) 04/17/2017  . Hypophosphatemia 04/17/2017  . Hypomagnesemia 04/17/2017  . Acute lower UTI 04/17/2017  . Hyperthyroidism 04/17/2017  . AKI (acute kidney injury) (HCC) 04/16/2017  . Anemia 11/08/2012  . GOUT 11/12/2010  . Chronic kidney disease 03/27/2009  . Diabetes 1.5, managed as type 1 (HCC) 09/21/2007  . Mixed hyperlipidemia 09/21/2007  . Essential hypertension 09/21/2007   PCP:  [name], NP Pharmacy:   [facility] [place], [place] - [address] [place] [address] Phone: [phone] Fax: [phone]     Social Determinants of Health (SDOH) Interventions    Readmission Risk Interventions No flowsheet data found.

## 2020-04-22 LAB — GLUCOSE, CAPILLARY
Glucose-Capillary: 110 mg/dL — ABNORMAL HIGH (ref 70–99)
Glucose-Capillary: 169 mg/dL — ABNORMAL HIGH (ref 70–99)
Glucose-Capillary: 118 mg/dL — ABNORMAL HIGH (ref 70–99)
Glucose-Capillary: 139 mg/dL — ABNORMAL HIGH (ref 70–99)

## 2020-04-22 LAB — POTASSIUM: Potassium: 3.9 mmol/L (ref 3.5–5.1)

## 2020-04-22 NOTE — Progress Notes (Signed)
PROGRESS NOTE    [name]  NKN:[id number] DOB: May 06, 1946 DOA: 04/18/2020 PCP: [name], NP      Brief Narrative:  Sherri Pratt is a 74 y.o. F with obesity, A. fib on Eliquis, CKD IIIb, HTN, DM, and Parkinson's disease who presented with confusion.  Evidently the patient had a few days of progressive poor oral intake, fever, and decreased mentation.  Finally she was found by her son somnolent, with generalized weakness and malaise.  In the ER, CXR showed RLL infiltrate.,  Leukocytosis, and encephalopathy.  She was started on empiric antibiotics in the hospital service were asked to evaluate.       Assessment & Plan:  Sepsis due to right lower lobe pneumonia Acute metabolic encephalopathy due to sepsis Patient presented with fever, leukocytosis, and encephalopathy in setting of right lower lobe pneumonia.  qSOFA score 1.  Started on empiric antibiotics, and symptoms improved. Patient now mentating at baseline, taking orals.  Temp < 100 F, heart rate < 100bpm, RR < 24, SpO2 at baseline.  She remained stable for discharge.  -Continue Cefpodoxime and doxycycline to complete 5 days  Paroxysmal atrial fibrillation Rate controlled -Continue metoprolol, Eliquis  Prolonged QT interval Mag and K normal.  QT interval resolved on ECG with this morning.  Reviewed, no ST changes, ischemic changes, QTC down to 466.  AKI on CKD stage III Creatinine 1.3 on admission, improved to 0.9 today after IV fluids, suspect this is her baseline.     Hypertension Blood pressure normal -Continue metoprolol  Type 2 diabetes, non-insulin-dependent, no complications Glucose well controlled Hemoglobin A1c 7% -Hold home glipizide -Continue sliding scale correction  Parkinson's disease -Continue home Sinemet  Gout -Continue allopurinol  Hyponatremia Dehydration Resolved  Pressure injury right thigh, stage II, POA -Consult WOC        Disposition: Status is:  Inpatient  Remains inpatient appropriate because: The patient's severity of illness and multiple electrolyte derangements and encephalopathy indicated severe underlying pneumonia.  This is now improved to the point she is stable for transition to outpatient therapy, but she is still too weak to transition home.  We will pursue SNF placement    Dispo:  Patient From: Home  Planned Disposition: Skilled Nursing Facility  Expected discharge date: 04/21/20  Medically stable for discharge: Yes            MDM: The below labs and imaging reports reviewed and summarized above.  Medication management as above.    DVT prophylaxis:  apixaban (ELIQUIS) tablet 5 mg  Code Status: FULL Family Communication:      Consultants:   Procedures:     Antimicrobials:   Cefepime and flagyl and vancomycin x1  Ceftriaxone  7/8 >> 7/10  Doxycycline 7/8 >>   Cefdinir 7/10 >>  Culture data:   7/7 blood cultures x2 -- ngtd           Subjective: No new fever, cough, leg swelling, orthopnea, confusion, dyspnea.  Objective: Vitals:   04/21/20 2129 04/22/20 0552 04/22/20 0933 04/22/20 1324  BP: (!) 163/84 (!) 157/70 129/65 105/83  Pulse: 66 (!) 55 63 61  Resp: 16 16  16   Temp: 98.2 F (36.8 C) 98 F (36.7 C)  97.6 F (36.4 C)  TempSrc: Oral Oral  Oral  SpO2: 95% 97%  98%  Weight:      Height:        Intake/Output Summary (Last 24 hours) at 04/22/2020 1651 Last data filed at 04/22/2020 0900 Gross per 24  hour  Intake 240 ml  Output 1100 ml  Net -860 ml   Filed Weights   04/19/20 0800 04/19/20 1456 04/20/20 0647  Weight: 88.9 kg 89.7 kg 93.2 kg    Examination: General appearance: Obese elderly female, lying in bed, easily arousable, interactive.     HEENT:    Skin:  Cardiac: RRR, no murmurs, no lower extremity edema Respiratory: Respiratory effort easy and unlabored, no rales or wheezing. Abdomen:   MSK:  Neuro: Extraocular movements intact, moves all  extremities with generalized weakness but symmetric strength, speech fluent. Psych: Attention normal, affect normal, judgment insight appear normal          Data Reviewed: I have personally reviewed following labs and imaging studies:  CBC: Recent Labs  Lab 04/18/20 1703 04/19/20 0349 04/21/20 0603  WBC 12.6* 9.3 5.9  NEUTROABS 11.3* 7.3  --   HGB 12.2 11.3* 11.1*  HCT 38.3 37.1 34.5*  MCV 85.9 89.0 85.4  PLT 222 197 151   Basic Metabolic Panel: Recent Labs  Lab 04/18/20 1703 04/19/20 0349 04/20/20 1123 04/21/20 0603 04/22/20 0800  NA 146* 148* 142 136  --   K 3.9 4.0 3.4* 3.2* 3.9  CL 114* 119* 108 105  --   CO2 20* 19* 25 26  --   GLUCOSE 297* 180* 142* 103*  --   BUN 55* 49* 35* 26*  --   CREATININE 1.42* 1.30* 0.94 0.90  --   CALCIUM 9.0 8.9 8.4* 8.3*  --   MG  --  2.2  --   --   --    GFR: Estimated Creatinine Clearance: 61 mL/min (by C-G formula based on SCr of 0.9 mg/dL). Liver Function Tests: Recent Labs  Lab 04/18/20 1703 04/19/20 0349 04/21/20 0603  AST 28 27 25   ALT 20 17 13   ALKPHOS 65 54 50  BILITOT 0.7 1.1 0.6  PROT 7.0 6.2* 6.1*  ALBUMIN 3.5 3.2* 3.3*   Recent Labs  Lab 04/18/20 1703  LIPASE 26   No results for input(s): AMMONIA in the last 168 hours. Coagulation Profile: Recent Labs  Lab 04/18/20 1752  INR 1.3*   Cardiac Enzymes: No results for input(s): CKTOTAL, CKMB, CKMBINDEX, TROPONINI in the last 168 hours. BNP (last 3 results) No results for input(s): PROBNP in the last 8760 hours. HbA1C: No results for input(s): HGBA1C in the last 72 hours. CBG: Recent Labs  Lab 04/21/20 1128 04/21/20 1647 04/21/20 2214 04/22/20 0727 04/22/20 1143  GLUCAP 169* 202* 114* 110* 169*   Lipid Profile: No results for input(s): CHOL, HDL, LDLCALC, TRIG, CHOLHDL, LDLDIRECT in the last 72 hours. Thyroid Function Tests: No results for input(s): TSH, T4TOTAL, FREET4, T3FREE, THYROIDAB in the last 72 hours. Anemia Panel: No results  for input(s): VITAMINB12, FOLATE, FERRITIN, TIBC, IRON, RETICCTPCT in the last 72 hours. Urine analysis:    Component Value Date/Time   COLORURINE YELLOW 04/18/2020 1755   APPEARANCEUR CLEAR 04/18/2020 1755   LABSPEC 1.025 04/18/2020 1755   PHURINE 5.0 04/18/2020 1755   GLUCOSEU 150 (A) 04/18/2020 1755   HGBUR NEGATIVE 04/18/2020 1755   HGBUR negative 11/04/2010 0921   BILIRUBINUR NEGATIVE 04/18/2020 1755   BILIRUBINUR 1+ 09/27/2018 1156   KETONESUR NEGATIVE 04/18/2020 1755   PROTEINUR 30 (A) 04/18/2020 1755   UROBILINOGEN 1.0 09/27/2018 1156   UROBILINOGEN 0.2 11/04/2010 0921   NITRITE NEGATIVE 04/18/2020 1755   LEUKOCYTESUR NEGATIVE 04/18/2020 1755   Sepsis Labs: @LABRCNTIP (procalcitonin:4,lacticacidven:4)  ) Recent Results (from the past 240 hour(s))  Culture, blood (Routine X 2) w Reflex to ID Panel     Status: None (Preliminary result)   Collection Time: 04/18/20  6:10 PM   Specimen: BLOOD RIGHT HAND  Result Value Ref Range Status   Specimen Description BLOOD RIGHT HAND  Final   Special Requests   Final    BOTTLES DRAWN AEROBIC AND ANAEROBIC Blood Culture adequate volume   Culture   Final    NO GROWTH 4 DAYS Performed at [facility], [address], [place], [place] [address]    Report Status PENDING  Incomplete  Culture, blood (Routine X 2) w Reflex to ID Panel     Status: None (Preliminary result)   Collection Time: 04/18/20  6:18 PM   Specimen: BLOOD LEFT HAND  Result Value Ref Range Status   Specimen Description BLOOD LEFT HAND  Final   Special Requests   Final    BOTTLES DRAWN AEROBIC AND ANAEROBIC Blood Culture adequate volume   Culture   Final    NO GROWTH 4 DAYS Performed at [facility], [address], [place], [place] [address]    Report Status PENDING  Incomplete  SARS Coronavirus 2 by RT PCR (hospital order, performed in [facility] hospital lab) Nasopharyngeal Nasopharyngeal Swab     Status: None   Collection Time: 04/18/20  9:09 PM    Specimen: Nasopharyngeal Swab  Result Value Ref Range Status   SARS Coronavirus 2 NEGATIVE NEGATIVE Final    Comment: (NOTE) SARS-CoV-2 target nucleic acids are NOT DETECTED.  The SARS-CoV-2 RNA is generally detectable in upper and lower respiratory specimens during the acute phase of infection. The lowest concentration of SARS-CoV-2 viral copies this assay can detect is 250 copies / mL. A negative result does not preclude SARS-CoV-2 infection and should not be used as the sole basis for treatment or other patient management decisions.  A negative result may occur with improper specimen collection / handling, submission of specimen other than nasopharyngeal swab, presence of viral mutation(s) within the areas targeted by this assay, and inadequate number of viral copies (<250 copies / mL). A negative result must be combined with clinical observations, patient history, and epidemiological information.  Fact Sheet for Patients:   BoilerBrush.com.cy  Fact Sheet for Healthcare Providers: https://pope.com/  This test is not yet approved or  cleared by the [place] FDA and has been authorized for detection and/or diagnosis of SARS-CoV-2 by FDA under an Emergency Use Authorization (EUA).  This EUA will remain in effect (meaning this test can be used) for the duration of the COVID-19 declaration under Section 564(b)(1) of the Act, 21 U.S.C. section 360bbb-3(b)(1), unless the authorization is terminated or revoked sooner.  Performed at [facility], [address] [place], [place] [address]          Radiology Studies: No results found.      Scheduled Meds: . apixaban  5 mg Oral BID  . carbidopa-levodopa  2 tablet Oral BID WC   And  . carbidopa-levodopa  1 tablet Oral q1600  . cefdinir  300 mg Oral Q12H  . doxycycline  100 mg Oral Q12H  . insulin aspart  0-5 Units Subcutaneous QHS  . insulin aspart  0-9 Units  Subcutaneous TID WC  . metoprolol tartrate  12.5 mg Oral BID  . nystatin   Topical BID  . simvastatin  20 mg Oral QHS  . sodium chloride flush  3 mL Intravenous Q12H   Continuous Infusions:  LOS: 4 days    Time spent: 25 minutes    Alberteen Samhristopher P Nikky Duba, MD [facility] 04/22/2020, 4:51 PM     Please page though AMION or Epic secure chat:  For [employer] Corporation[employer] password, Higher education careers advisercontact charge nurse

## 2020-04-23 DIAGNOSIS — G9341 Metabolic encephalopathy: Secondary | ICD-10-CM

## 2020-04-23 DIAGNOSIS — N179 Acute kidney failure, unspecified: Secondary | ICD-10-CM

## 2020-04-23 DIAGNOSIS — A419 Sepsis, unspecified organism: Principal | ICD-10-CM

## 2020-04-23 DIAGNOSIS — J189 Pneumonia, unspecified organism: Secondary | ICD-10-CM

## 2020-04-23 DIAGNOSIS — R6520 Severe sepsis without septic shock: Secondary | ICD-10-CM

## 2020-04-23 LAB — CULTURE, BLOOD (ROUTINE X 2)
Culture: NO GROWTH
Culture: NO GROWTH
Special Requests: ADEQUATE
Special Requests: ADEQUATE

## 2020-04-23 LAB — GLUCOSE, CAPILLARY
Glucose-Capillary: 118 mg/dL — ABNORMAL HIGH (ref 70–99)
Glucose-Capillary: 179 mg/dL — ABNORMAL HIGH (ref 70–99)
Glucose-Capillary: 121 mg/dL — ABNORMAL HIGH (ref 70–99)
Glucose-Capillary: 134 mg/dL — ABNORMAL HIGH (ref 70–99)

## 2020-04-23 LAB — SARS CORONAVIRUS 2 BY RT PCR (HOSPITAL ORDER, PERFORMED IN ~~LOC~~ HOSPITAL LAB): SARS Coronavirus 2: NEGATIVE

## 2020-04-23 NOTE — Progress Notes (Signed)
Physical Therapy Treatment Patient Details Name: Sherri Pratt MRN: [id number] DOB: 23-Nov-1945 Today's Date: 04/23/2020    History of Present Illness Pt admitted from home with AMS, sepsis 2* RLL PNA, and acute metabolic encephalopathy 2* sepsis.      PT Comments    Pt OOB in recliner.  General transfer comment: increased time and elevated surface needed to power up.  Also assisted with elevated toilet transfer.  Mild unsteadiness. General Gait Details: decreased amb distance this session due to increased c/o weakness and "stiffness" throughout.  Mild resting tremor.  Prior did not mab with any AD.  "I have a walker at home but I don't use it".  Def need for walker this session due to instability ans mild tremor. Pt will need ST Rehab at SNF to regain her prior level of mobility and Indep  Follow Up Recommendations  SNF     Equipment Recommendations  None recommended by PT    Recommendations for Other Services       Precautions / Restrictions Precautions Precaution Comments: Parkinsons Restrictions Weight Bearing Restrictions: No    Mobility  Bed Mobility               General bed mobility comments: OOB in recliner  Transfers Overall transfer level: Needs assistance Equipment used: Rolling walker (2 wheeled);1 person hand held assist Transfers: Sit to/from Stand;Stand Pivot Transfers Sit to Stand: Min assist Stand pivot transfers: Min assist       General transfer comment: increased time and elevated surface needed to power up.  Also assisted with elevated toilet transfer.  Mild unsteadiness.  Ambulation/Gait Ambulation/Gait assistance: Min guard Gait Distance (Feet): 65 Feet Assistive device: Rolling walker (2 wheeled) Gait Pattern/deviations: Step-through pattern;Decreased stride length;Shuffle;Trunk flexed;Wide base of support Gait velocity: decreased   General Gait Details: decreased amb distance this session due to increased c/o weakness and "stiffness"  throughout.  Mild resting tremor.  Prior did not mab with any AD.  "I have a walker at home but I don't use it".  Def need for walker this session due to instability ans mild tremor.   Stairs             Wheelchair Mobility    Modified Rankin (Stroke Patients Only)       Balance                                            Cognition                                              Exercises      General Comments        Pertinent Vitals/Pain Pain Assessment: No/denies pain    Home Living                      Prior Function            PT Goals (current goals can now be found in the care plan section) Progress towards PT goals: Progressing toward goals    Frequency    Min 3X/week      PT Plan Current plan remains appropriate    Co-evaluation  AM-PAC PT "6 Clicks" Mobility   Outcome Measure  Help needed turning from your back to your side while in a flat bed without using bedrails?: A Little Help needed moving from lying on your back to sitting on the side of a flat bed without using bedrails?: A Little Help needed moving to and from a bed to a chair (including a wheelchair)?: A Little Help needed standing up from a chair using your arms (e.g., wheelchair or bedside chair)?: A Little Help needed to walk in hospital room?: A Little Help needed climbing 3-5 steps with a railing? : A Lot 6 Click Score: 17    End of Session Equipment Utilized During Treatment: Gait belt Activity Tolerance: Patient limited by fatigue Patient left: in chair;with call bell/phone within reach Nurse Communication: Mobility status PT Visit Diagnosis: Unsteadiness on feet (R26.81);Muscle weakness (generalized) (M62.81);Difficulty in walking, not elsewhere classified (R26.2)     Time: 1245-8099 PT Time Calculation (min) (ACUTE ONLY): 23 min  Charges:  $Gait Training: 8-22 mins $Therapeutic Activity: 8-22  mins                     Felecia Shelling  PTA Acute  Rehabilitation Services Pager      [phone] Office      [phone]

## 2020-04-23 NOTE — Care Management Important Message (Signed)
Important Message  Patient Details IM Letter given to Sandford Craze RN Case Manager to present to the Patient Name: Sherri Pratt MRN: [id number] Date of Birth: July 13, 1946   Medicare Important Message Given:  Yes     Caren Macadam 04/23/2020, 10:44 AM

## 2020-04-23 NOTE — Progress Notes (Signed)
PROGRESS NOTE  Sherri Pratt XHB:[id number] DOB: Feb 20, 1946 DOA: 04/18/2020 PCP: [name], NP   LOS: 5 days   Brief Narrative / Interim history: 74 year old female with obesity, A. fib on Eliquis, CKD 3B, HTN, DM, Parkinson's disease who came in with confusion.  Few days prior to admission had poor p.o. intake, fever and decreased mentation.  Son found her somnolent and brought her to the hospital.  Chest x-ray showed right lower lobe infiltrate, leukocytosis and encephalopathy.  Subjective / 24h Interval events: She is doing much better this morning, hoping to leave the hospital soon.  Denies any shortness of breath, denies any chest pain.  Assessment & Plan: Principal Problem Sepsis due to lobar pneumonia, right lower lobe, complicated by acute metabolic encephalopathy, POA-patient admitted to the hospital with fever, leukocytosis and encephalopathy in the setting of right lower lobe pneumonia.  She was started on broad-spectrum IV antibiotics with improvement in her mentation and clinical status.  She eventually was transitioned to oral antibiotics with cefpodoxime and doxycycline with plans to complete a total of 5 days, today being day 4  Active Problems Paroxysmal atrial fibrillation-rate controlled, continue home medications with Eliquis, metoprolol Acute kidney injury on chronic kidney disease stage IIIb-creatinine 1.3 on admission, improved to her baseline with fluids. Essential hypertension-continue home medications Type 2 diabetes mellitus, non-insulin-dependent, no complications-continue home medications, carbon fiber diet Parkinson's disease-continue home Sinemet Gout-continue home allopurinol Hyponatremia, dehydration-resolved Pressure injury right thigh, stage II, POA-wound care protocol per wound care nurse, as below:  Apply Nystatin powder topically to perineum and abd/thigh folds BID and PRN when turning and cleaning. Foam dressings to stage 2 pressure injuries, change  Q 3 days or PRN soiling.   Pressure Injury 04/20/20 Right Stage 2 -  Partial thickness loss of dermis presenting as a shallow open injury with a red, pink wound bed without slough. Stage 2 pressure injuries to middle back, right buttock, right hip, right posterior thigh, right inn (Active)  04/20/20   Location:   Location Orientation: Right  Staging: Stage 2 -  Partial thickness loss of dermis presenting as a shallow open injury with a red, pink wound bed without slough.  Wound Description (Comments): Stage 2 pressure injuries to middle back, right buttock, right hip, right posterior thigh, right inner thigh/hip  Present on Admission: Yes   Scheduled Meds: . apixaban  5 mg Oral BID  . carbidopa-levodopa  2 tablet Oral BID WC   And  . carbidopa-levodopa  1 tablet Oral q1600  . cefdinir  300 mg Oral Q12H  . doxycycline  100 mg Oral Q12H  . insulin aspart  0-5 Units Subcutaneous QHS  . insulin aspart  0-9 Units Subcutaneous TID WC  . metoprolol tartrate  12.5 mg Oral BID  . nystatin   Topical BID  . simvastatin  20 mg Oral QHS  . sodium chloride flush  3 mL Intravenous Q12H   Continuous Infusions: PRN Meds:.acetaminophen **OR** acetaminophen  Diet Orders (From admission, onward)    Start     Ordered   04/19/20 1722  Diet Carb Modified Fluid consistency: Thin; Room service appropriate? Yes  Diet effective now       Comments: Soft foods only; pt does not have dentures in at this time  Question Answer Comment  Diet-HS Snack? Nothing   Calorie Level Medium 1600-2000   Fluid consistency: Thin   Room service appropriate? Yes      04/19/20 1722  DVT prophylaxis:  apixaban (ELIQUIS) tablet 5 mg     Code Status: Full Code  Family Communication: no family at bedside   Status is: Inpatient  Remains inpatient appropriate because:Awaiting SNF   Dispo:  Patient From: Home  Planned Disposition: Skilled Nursing Facility  Expected discharge date: 04/21/20  Medically  stable for discharge: Yes    Consultants:  none  Procedures:  None   Microbiology  none  Antimicrobials: Cefpodoxime/doxycycline   Objective: Vitals:   04/22/20 1324 04/22/20 2058 04/23/20 0500 04/23/20 1000  BP: 105/83 (!) 146/71  126/62  Pulse: 61 72  66  Resp: 16 17    Temp: 97.6 F (36.4 C) 98.1 F (36.7 C)    TempSrc: Oral Oral    SpO2: 98% 100%    Weight:   93.6 kg   Height:        Intake/Output Summary (Last 24 hours) at 04/23/2020 1107 Last data filed at 04/23/2020 1000 Gross per 24 hour  Intake 120 ml  Output 2500 ml  Net -2380 ml   Filed Weights   04/19/20 1456 04/20/20 0647 04/23/20 0500  Weight: 89.7 kg 93.2 kg 93.6 kg    Examination:  Constitutional: NAD Eyes: no scleral icterus ENMT: Mucous membranes are moist.  Neck: normal, supple Respiratory: clear to auscultation bilaterally, no wheezing, no crackles. Cardiovascular: Regular rate and rhythm, no murmurs / rubs / gallops.  Abdomen: non distended, no tenderness.  Musculoskeletal: no clubbing / cyanosis.  Skin: no rashes Neurologic: non focal   Data Reviewed: I have independently reviewed following labs and imaging studies   CBC: Recent Labs  Lab 04/18/20 1703 04/19/20 0349 04/21/20 0603  WBC 12.6* 9.3 5.9  NEUTROABS 11.3* 7.3  --   HGB 12.2 11.3* 11.1*  HCT 38.3 37.1 34.5*  MCV 85.9 89.0 85.4  PLT 222 197 151   Basic Metabolic Panel: Recent Labs  Lab 04/18/20 1703 04/19/20 0349 04/20/20 1123 04/21/20 0603 04/22/20 0800  NA 146* 148* 142 136  --   K 3.9 4.0 3.4* 3.2* 3.9  CL 114* 119* 108 105  --   CO2 20* 19* 25 26  --   GLUCOSE 297* 180* 142* 103*  --   BUN 55* 49* 35* 26*  --   CREATININE 1.42* 1.30* 0.94 0.90  --   CALCIUM 9.0 8.9 8.4* 8.3*  --   MG  --  2.2  --   --   --    Liver Function Tests: Recent Labs  Lab 04/18/20 1703 04/19/20 0349 04/21/20 0603  AST 28 27 25   ALT 20 17 13   ALKPHOS 65 54 50  BILITOT 0.7 1.1 0.6  PROT 7.0 6.2* 6.1*  ALBUMIN  3.5 3.2* 3.3*   Coagulation Profile: Recent Labs  Lab 04/18/20 1752  INR 1.3*   HbA1C: No results for input(s): HGBA1C in the last 72 hours. CBG: Recent Labs  Lab 04/22/20 0727 04/22/20 1143 04/22/20 1705 04/22/20 2135 04/23/20 0710  GLUCAP 110* 169* 118* 139* 118*    Recent Results (from the past 240 hour(s))  Culture, blood (Routine X 2) w Reflex to ID Panel     Status: None (Preliminary result)   Collection Time: 04/18/20  6:10 PM   Specimen: BLOOD RIGHT HAND  Result Value Ref Range Status   Specimen Description BLOOD RIGHT HAND  Final   Special Requests   Final    BOTTLES DRAWN AEROBIC AND ANAEROBIC Blood Culture adequate volume   Culture   Final  NO GROWTH 4 DAYS Performed at [facility], [address], [place], [place] [address]    Report Status PENDING  Incomplete  Culture, blood (Routine X 2) w Reflex to ID Panel     Status: None (Preliminary result)   Collection Time: 04/18/20  6:18 PM   Specimen: BLOOD LEFT HAND  Result Value Ref Range Status   Specimen Description BLOOD LEFT HAND  Final   Special Requests   Final    BOTTLES DRAWN AEROBIC AND ANAEROBIC Blood Culture adequate volume   Culture   Final    NO GROWTH 4 DAYS Performed at [facility], [address], [place], [place] [address]    Report Status PENDING  Incomplete  SARS Coronavirus 2 by RT PCR (hospital order, performed in [facility] hospital lab) Nasopharyngeal Nasopharyngeal Swab     Status: None   Collection Time: 04/18/20  9:09 PM   Specimen: Nasopharyngeal Swab  Result Value Ref Range Status   SARS Coronavirus 2 NEGATIVE NEGATIVE Final    Comment: (NOTE) SARS-CoV-2 target nucleic acids are NOT DETECTED.  The SARS-CoV-2 RNA is generally detectable in upper and lower respiratory specimens during the acute phase of infection. The lowest concentration of SARS-CoV-2 viral copies this assay can detect is 250 copies / mL. A negative result does not preclude SARS-CoV-2  infection and should not be used as the sole basis for treatment or other patient management decisions.  A negative result may occur with improper specimen collection / handling, submission of specimen other than nasopharyngeal swab, presence of viral mutation(s) within the areas targeted by this assay, and inadequate number of viral copies (<250 copies / mL). A negative result must be combined with clinical observations, patient history, and epidemiological information.  Fact Sheet for Patients:   BoilerBrush.com.cy  Fact Sheet for Healthcare Providers: https://pope.com/  This test is not yet approved or  cleared by the [place] FDA and has been authorized for detection and/or diagnosis of SARS-CoV-2 by FDA under an Emergency Use Authorization (EUA).  This EUA will remain in effect (meaning this test can be used) for the duration of the COVID-19 declaration under Section 564(b)(1) of the Act, 21 U.S.C. section 360bbb-3(b)(1), unless the authorization is terminated or revoked sooner.  Performed at [facility], [address], [place], [place] [address]      Radiology Studies: No results found.  Pamella Pert, MD, PhD [facility]  Between 7 am - 7 pm I am available, please contact me via Amion or Securechat  Between 7 pm - 7 am I am not available, please contact night coverage MD/APP via Amion

## 2020-04-23 NOTE — TOC Progression Note (Addendum)
Transition of Care [facility]) - Progression Note    Patient Details  Name: Sherri Pratt MRN: [id number] Date of Birth: 16-Aug-1946  Transition of Care [facility]) CM/SW Contact  Lanika Colgate, Meriam Sprague, RN Phone Number: 04/23/2020, 10:44 AM  Clinical Narrative:    Bed offers given to patient and [facility] chosen. [facility] liaison contacted to accept bed. Authorization started with [facility] for SNF.  Covid test ordered by MD. TOC will continue to follow.  Expected Discharge Plan: Skilled Nursing Facility Barriers to Discharge: SNF Pending bed offer  Expected Discharge Plan and Services Expected Discharge Plan: Skilled Nursing Facility   Discharge Planning Services: CM Consult Post Acute Care Choice: Skilled Nursing Facility Living arrangements for the past 2 months: Single Family Home                                       Social Determinants of Health (SDOH) Interventions    Readmission Risk Interventions No flowsheet data found.

## 2020-04-24 DIAGNOSIS — L89212 Pressure ulcer of right hip, stage 2: Secondary | ICD-10-CM | POA: Diagnosis not present

## 2020-04-24 DIAGNOSIS — J189 Pneumonia, unspecified organism: Secondary | ICD-10-CM | POA: Diagnosis not present

## 2020-04-24 DIAGNOSIS — R489 Unspecified symbolic dysfunctions: Secondary | ICD-10-CM | POA: Diagnosis not present

## 2020-04-24 DIAGNOSIS — E039 Hypothyroidism, unspecified: Secondary | ICD-10-CM | POA: Diagnosis not present

## 2020-04-24 DIAGNOSIS — A403 Sepsis due to Streptococcus pneumoniae: Secondary | ICD-10-CM | POA: Diagnosis not present

## 2020-04-24 DIAGNOSIS — E1121 Type 2 diabetes mellitus with diabetic nephropathy: Secondary | ICD-10-CM | POA: Diagnosis not present

## 2020-04-24 DIAGNOSIS — G9341 Metabolic encephalopathy: Secondary | ICD-10-CM | POA: Diagnosis not present

## 2020-04-24 DIAGNOSIS — N179 Acute kidney failure, unspecified: Secondary | ICD-10-CM | POA: Diagnosis not present

## 2020-04-24 DIAGNOSIS — L89892 Pressure ulcer of other site, stage 2: Secondary | ICD-10-CM | POA: Diagnosis not present

## 2020-04-24 DIAGNOSIS — G20 Parkinson's disease: Secondary | ICD-10-CM | POA: Diagnosis not present

## 2020-04-24 DIAGNOSIS — E119 Type 2 diabetes mellitus without complications: Secondary | ICD-10-CM | POA: Diagnosis not present

## 2020-04-24 DIAGNOSIS — R627 Adult failure to thrive: Secondary | ICD-10-CM

## 2020-04-24 DIAGNOSIS — R6520 Severe sepsis without septic shock: Secondary | ICD-10-CM | POA: Diagnosis not present

## 2020-04-24 DIAGNOSIS — D649 Anemia, unspecified: Secondary | ICD-10-CM | POA: Diagnosis not present

## 2020-04-24 DIAGNOSIS — E876 Hypokalemia: Secondary | ICD-10-CM | POA: Diagnosis not present

## 2020-04-24 DIAGNOSIS — L89312 Pressure ulcer of right buttock, stage 2: Secondary | ICD-10-CM | POA: Diagnosis not present

## 2020-04-24 DIAGNOSIS — A419 Sepsis, unspecified organism: Secondary | ICD-10-CM | POA: Diagnosis not present

## 2020-04-24 DIAGNOSIS — Z79899 Other long term (current) drug therapy: Secondary | ICD-10-CM | POA: Diagnosis not present

## 2020-04-24 DIAGNOSIS — I10 Essential (primary) hypertension: Secondary | ICD-10-CM | POA: Diagnosis not present

## 2020-04-24 DIAGNOSIS — E785 Hyperlipidemia, unspecified: Secondary | ICD-10-CM | POA: Diagnosis not present

## 2020-04-24 DIAGNOSIS — I480 Paroxysmal atrial fibrillation: Secondary | ICD-10-CM | POA: Diagnosis not present

## 2020-04-24 DIAGNOSIS — N183 Chronic kidney disease, stage 3 unspecified: Secondary | ICD-10-CM | POA: Diagnosis not present

## 2020-04-24 DIAGNOSIS — R0989 Other specified symptoms and signs involving the circulatory and respiratory systems: Secondary | ICD-10-CM | POA: Diagnosis not present

## 2020-04-24 DIAGNOSIS — R1319 Other dysphagia: Secondary | ICD-10-CM | POA: Diagnosis not present

## 2020-04-24 DIAGNOSIS — R05 Cough: Secondary | ICD-10-CM | POA: Diagnosis not present

## 2020-04-24 LAB — GLUCOSE, CAPILLARY
Glucose-Capillary: 119 mg/dL — ABNORMAL HIGH (ref 70–99)
Glucose-Capillary: 203 mg/dL — ABNORMAL HIGH (ref 70–99)

## 2020-04-24 NOTE — Discharge Summary (Signed)
Physician Discharge Summary  Sherri Pratt:[id number] DOB: 1946-03-01 DOA: 04/18/2020  PCP: [name], NP  Admit date: 04/18/2020 Discharge date: 04/24/2020  Admitted From: home Disposition: SNF  Recommendations for Outpatient Follow-up:  1. Follow up with PCP in 1-2 weeks  Home Health: None Equipment/Devices: None  Discharge Condition: Stable CODE STATUS: Full code Diet recommendation: Regular diet  HPI: Per admitting MD, Sherri Pratt is a 74 y.o. female with medical history significant for paroxysmal atrial fibrillation chronically anticoagulated on Eliquis, hypertension, hyperlipidemia, type 2 diabetes mellitus, Parkinson's disease who is admitted to [facility] on 04/18/2020 with severe sepsis due to suspected right lower lobe pneumonia after presenting from home to [facility] Emergency Department for evaluation of confusion. In the setting of the patient's presenting confusion, the following history is obtained via my discussions with the patient's son, who is present at bedside, in addition to my discussions with the emergency department physician and via chart review.The patient's son reports that he had previously visited his mother at home on Saturday, 04/14/2020 at which time he noted the patient to be at her baseline mental status, at which she would be completely alert and oriented, including being able to correctly identify her date of birth as well as the President of the [place].  However, when the son visited his mother again today, he noted interval development of confusion as well as worsening somnolence.  The patient reported subjective fever over the last day, but is otherwise without acute complaint at this time.  Patient son is not aware of any interval trauma that his mother may have incurred.  He reports that at baseline, the patient lives independently with her husband, who suffers from advanced dementia.  Additionally, baseline she is able to  ambulate without assistance. Son confirms that patient suffers from Parkinson's Disease and that the resting tremors present in the ED are consistent with baseline. Patient's son suspects that the patient has had very poor oral intake developing in the interval since he last saw her on Saturday, 04/14/20, concominant with interval development of confusion.  In the setting of interval development of confusion, increased somnolence, and generalized weakness, the patient's son called EMS, who subsequently brought the patient to [facility] emergency department for further evaluation management of such. No recent traveling or known COVID-19 exposures.   Hospital Course / Discharge diagnoses: Principal Problem Sepsis due to lobar pneumonia, right lower lobe, complicated by acute metabolic encephalopathy, POA-patient admitted to the hospital with fever, leukocytosis and encephalopathy in the setting of right lower lobe pneumonia.  She was started on broad-spectrum IV antibiotics with improvement in her mentation and clinical status.  She eventually was transitioned to oral antibiotics with cefpodoxime and doxycycline and completed 5 days while hospitalized.  Active Problems Paroxysmal atrial fibrillation-rate controlled, continue home medications with Eliquis, metoprolol Acute kidney injury on chronic kidney disease stage IIIb-creatinine 1.3 on admission, improved to her baseline with fluids. Essential hypertension-continue home medications Type 2 diabetes mellitus, non-insulin-dependent, no complications-continue home medications, carb modified diet Parkinson's disease-continue home Sinemet Gout-continue home allopurinol Hyponatremia, dehydration-resolved Pressure injury right thigh, stage II, POA-wound care protocol per wound care nurse, as below:  ApplyNystatin powdertopically to perineum and abd/thigh folds BID and PRN when turning and cleaning.Foam dressings to stage 2 pressure injuries, change Q 3  days or PRN soiling.  Pressure Injury 04/20/20 Right Stage 2 -  Partial thickness loss of dermis presenting as a shallow open injury with a red,  pink wound bed without slough. Stage 2 pressure injuries to middle back, right buttock, right hip, right posterior thigh, right inn (Active)  04/20/20   Location:   Location Orientation: Right  Staging: Stage 2 -  Partial thickness loss of dermis presenting as a shallow open injury with a red, pink wound bed without slough.  Wound Description (Comments): Stage 2 pressure injuries to middle back, right buttock, right hip, right posterior thigh, right inner thigh/hip  Present on Admission: Yes   Discharge Instructions  Allergies as of 04/24/2020      Reactions   Peanut-containing Drug Products    unknown      Medication List    TAKE these medications   allopurinol 300 MG tablet Commonly known as: ZYLOPRIM Take 1 tablet (300 mg total) by mouth daily.   carbidopa-levodopa 25-100 MG tablet Commonly known as: SINEMET IR Take 2 tablets at 8:30am/2 tablets at 12:30pm/1 tablet at 4:30pm What changed:   how much to take  how to take this  when to take this  additional instructions   Eliquis 5 MG Tabs tablet Generic drug: apixaban TAKE 1 TABLET BY MOUTH TWICE A DAY   glipiZIDE 2.5 MG 24 hr tablet Commonly known as: GLUCOTROL XL Take 1 tablet (2.5 mg total) by mouth daily with breakfast.   metoprolol tartrate 25 MG tablet Commonly known as: LOPRESSOR TAKE 1/2 TABLET BY MOUTH TWICE DAILY   simvastatin 20 MG tablet Commonly known as: ZOCOR Take 1 tablet (20 mg total) by mouth at bedtime.        Consultations:  None   Procedures/Studies:  CT Head Wo Contrast  Result Date: 04/18/2020 CLINICAL DATA:  74 year old female with altered mental status. EXAM: CT HEAD WITHOUT CONTRAST TECHNIQUE: Contiguous axial images were obtained from the base of the skull through the vertex without intravenous contrast. COMPARISON:  Head CT dated  04/15/2017. FINDINGS: Brain: Mild age-related atrophy and chronic microvascular ischemic changes. There is no acute intracranial hemorrhage. No mass effect or midline shift no extra-axial fluid collection. Vascular: No hyperdense vessel or unexpected calcification. Skull: Normal. Negative for fracture or focal lesion. Sinuses/Orbits: No acute finding. Other: None IMPRESSION: 1. No acute intracranial pathology. 2. Mild age-related atrophy and chronic microvascular ischemic changes. Electronically Signed   By: Elgie Collard M.D.   On: 04/18/2020 20:43   DG Chest Port 1 View  Result Date: 04/18/2020 CLINICAL DATA:  Failure to thrive. EXAM: PORTABLE CHEST 1 VIEW COMPARISON:  October 12, 2018. FINDINGS: Stable cardiomediastinal silhouette. No pneumothorax or significant pleural effusion is noted. Left lung is clear. Mild right basilar atelectasis or infiltrate is noted. Bony thorax is unremarkable. IMPRESSION: Mild right basilar subsegmental atelectasis or infiltrate. Electronically Signed   By: Lupita Raider M.D.   On: 04/18/2020 17:02      Subjective: - no chest pain, shortness of breath, no abdominal pain, nausea or vomiting.   Discharge Exam: BP 120/69 (BP Location: Right Arm)   Pulse 66   Temp 97.6 F (36.4 C) (Oral)   Resp 17   Ht 5\' 5"  (1.651 m)   Wt 93.6 kg   SpO2 99%   BMI 34.34 kg/m   General: Pt is alert, awake, not in acute distress Cardiovascular: RRR, S1/S2 +, no rubs, no gallops Respiratory: CTA bilaterally, no wheezing, no rhonchi Abdominal: Soft, NT, ND, bowel sounds + Extremities: no edema, no cyanosis  The results of significant diagnostics from this hospitalization (including imaging, microbiology, ancillary and laboratory) are listed below for  reference.     Microbiology: Recent Results (from the past 240 hour(s))  Culture, blood (Routine X 2) w Reflex to ID Panel     Status: None   Collection Time: 04/18/20  6:10 PM   Specimen: BLOOD RIGHT HAND  Result  Value Ref Range Status   Specimen Description BLOOD RIGHT HAND  Final   Special Requests   Final    BOTTLES DRAWN AEROBIC AND ANAEROBIC Blood Culture adequate volume   Culture   Final    NO GROWTH 5 DAYS Performed at [facility], [address] [place], [place] [address]    Report Status 04/23/2020 FINAL  Final  Culture, blood (Routine X 2) w Reflex to ID Panel     Status: None   Collection Time: 04/18/20  6:18 PM   Specimen: BLOOD LEFT HAND  Result Value Ref Range Status   Specimen Description BLOOD LEFT HAND  Final   Special Requests   Final    BOTTLES DRAWN AEROBIC AND ANAEROBIC Blood Culture adequate volume   Culture   Final    NO GROWTH 5 DAYS Performed at [facility], [address], [place], [place] [address]    Report Status 04/23/2020 FINAL  Final  SARS Coronavirus 2 by RT PCR (hospital order, performed in [facility] hospital lab) Nasopharyngeal Nasopharyngeal Swab     Status: None   Collection Time: 04/18/20  9:09 PM   Specimen: Nasopharyngeal Swab  Result Value Ref Range Status   SARS Coronavirus 2 NEGATIVE NEGATIVE Final    Comment: (NOTE) SARS-CoV-2 target nucleic acids are NOT DETECTED.  The SARS-CoV-2 RNA is generally detectable in upper and lower respiratory specimens during the acute phase of infection. The lowest concentration of SARS-CoV-2 viral copies this assay can detect is 250 copies / mL. A negative result does not preclude SARS-CoV-2 infection and should not be used as the sole basis for treatment or other patient management decisions.  A negative result may occur with improper specimen collection / handling, submission of specimen other than nasopharyngeal swab, presence of viral mutation(s) within the areas targeted by this assay, and inadequate number of viral copies (<250 copies / mL). A negative result must be combined with clinical observations, patient history, and epidemiological information.  Fact Sheet for Patients:     BoilerBrush.com.cy  Fact Sheet for Healthcare Providers: https://pope.com/  This test is not yet approved or  cleared by the [place] FDA and has been authorized for detection and/or diagnosis of SARS-CoV-2 by FDA under an Emergency Use Authorization (EUA).  This EUA will remain in effect (meaning this test can be used) for the duration of the COVID-19 declaration under Section 564(b)(1) of the Act, 21 U.S.C. section 360bbb-3(b)(1), unless the authorization is terminated or revoked sooner.  Performed at [facility], [address], [place], [place] [address]   SARS Coronavirus 2 by RT PCR (hospital order, performed in [facility] hospital lab) Nasopharyngeal Nasopharyngeal Swab     Status: None   Collection Time: 04/23/20 11:06 AM   Specimen: Nasopharyngeal Swab  Result Value Ref Range Status   SARS Coronavirus 2 NEGATIVE NEGATIVE Final    Comment: (NOTE) SARS-CoV-2 target nucleic acids are NOT DETECTED.  The SARS-CoV-2 RNA is generally detectable in upper and lower respiratory specimens during the acute phase of infection. The lowest concentration of SARS-CoV-2 viral copies this assay can detect is 250 copies / mL. A negative result does not preclude SARS-CoV-2 infection and should  not be used as the sole basis for treatment or other patient management decisions.  A negative result may occur with improper specimen collection / handling, submission of specimen other than nasopharyngeal swab, presence of viral mutation(s) within the areas targeted by this assay, and inadequate number of viral copies (<250 copies / mL). A negative result must be combined with clinical observations, patient history, and epidemiological information.  Fact Sheet for Patients:   BoilerBrush.com.cy  Fact Sheet for Healthcare Providers: https://pope.com/  This test is not yet approved or   cleared by the [place] FDA and has been authorized for detection and/or diagnosis of SARS-CoV-2 by FDA under an Emergency Use Authorization (EUA).  This EUA will remain in effect (meaning this test can be used) for the duration of the COVID-19 declaration under Section 564(b)(1) of the Act, 21 U.S.C. section 360bbb-3(b)(1), unless the authorization is terminated or revoked sooner.  Performed at [facility], [address] [place], [place] [address]      Labs: Basic Metabolic Panel: Recent Labs  Lab 04/18/20 1703 04/19/20 0349 04/20/20 1123 04/21/20 0603 04/22/20 0800  NA 146* 148* 142 136  --   K 3.9 4.0 3.4* 3.2* 3.9  CL 114* 119* 108 105  --   CO2 20* 19* 25 26  --   GLUCOSE 297* 180* 142* 103*  --   BUN 55* 49* 35* 26*  --   CREATININE 1.42* 1.30* 0.94 0.90  --   CALCIUM 9.0 8.9 8.4* 8.3*  --   MG  --  2.2  --   --   --    Liver Function Tests: Recent Labs  Lab 04/18/20 1703 04/19/20 0349 04/21/20 0603  AST 28 27 25   ALT 20 17 13   ALKPHOS 65 54 50  BILITOT 0.7 1.1 0.6  PROT 7.0 6.2* 6.1*  ALBUMIN 3.5 3.2* 3.3*   CBC: Recent Labs  Lab 04/18/20 1703 04/19/20 0349 04/21/20 0603  WBC 12.6* 9.3 5.9  NEUTROABS 11.3* 7.3  --   HGB 12.2 11.3* 11.1*  HCT 38.3 37.1 34.5*  MCV 85.9 89.0 85.4  PLT 222 197 151   CBG: Recent Labs  Lab 04/23/20 1140 04/23/20 1653 04/23/20 2140 04/24/20 0748 04/24/20 1201  GLUCAP 179* 121* 134* 119* 203*   Hgb A1c No results for input(s): HGBA1C in the last 72 hours. Lipid Profile No results for input(s): CHOL, HDL, LDLCALC, TRIG, CHOLHDL, LDLDIRECT in the last 72 hours. Thyroid function studies No results for input(s): TSH, T4TOTAL, T3FREE, THYROIDAB in the last 72 hours.  Invalid input(s): FREET3 Urinalysis    Component Value Date/Time   COLORURINE YELLOW 04/18/2020 1755   APPEARANCEUR CLEAR 04/18/2020 1755   LABSPEC 1.025 04/18/2020 1755   PHURINE 5.0 04/18/2020 1755   GLUCOSEU 150  (A) 04/18/2020 1755   HGBUR NEGATIVE 04/18/2020 1755   HGBUR negative 11/04/2010 0921   BILIRUBINUR NEGATIVE 04/18/2020 1755   BILIRUBINUR 1+ 09/27/2018 1156   KETONESUR NEGATIVE 04/18/2020 1755   PROTEINUR 30 (A) 04/18/2020 1755   UROBILINOGEN 1.0 09/27/2018 1156   UROBILINOGEN 0.2 11/04/2010 0921   NITRITE NEGATIVE 04/18/2020 1755   LEUKOCYTESUR NEGATIVE 04/18/2020 1755    FURTHER DISCHARGE INSTRUCTIONS:   Get Medicines reviewed and adjusted: Please take all your medications with you for your next visit with your Primary MD   Laboratory/radiological data: Please request your Primary MD to go over all hospital tests and procedure/radiological results at the follow up, please ask your Primary MD to get all  Hospital records sent to his/her office.   In some cases, they will be blood work, cultures and biopsy results pending at the time of your discharge. Please request that your primary care M.D. goes through all the records of your hospital data and follows up on these results.   Also Note the following: If you experience worsening of your admission symptoms, develop shortness of breath, life threatening emergency, suicidal or homicidal thoughts you must seek medical attention immediately by calling 911 or calling your MD immediately  if symptoms less severe.   You must read complete instructions/literature along with all the possible adverse reactions/side effects for all the Medicines you take and that have been prescribed to you. Take any new Medicines after you have completely understood and accpet all the possible adverse reactions/side effects.    Do not drive when taking Pain medications or sleeping medications (Benzodaizepines)   Do not take more than prescribed Pain, Sleep and Anxiety Medications. It is not advisable to combine anxiety,sleep and pain medications without talking with your primary care practitioner   Special Instructions: If you have smoked or chewed Tobacco   in the last 2 yrs please stop smoking, stop any regular Alcohol  and or any Recreational drug use.   Wear Seat belts while driving.   Please note: You were cared for by a hospitalist during your hospital stay. Once you are discharged, your primary care physician will handle any further medical issues. Please note that NO REFILLS for any discharge medications will be authorized once you are discharged, as it is imperative that you return to your primary care physician (or establish a relationship with a primary care physician if you do not have one) for your post hospital discharge needs so that they can reassess your need for medications and monitor your lab values.  Time coordinating discharge: 35 minutes  SIGNED:  Pamella Pertostin Katniss Weedman, MD, PhD 04/24/2020, 12:44 PM

## 2020-04-24 NOTE — Progress Notes (Signed)
Son, Sherri Pratt arrived to take patient to [facility] instead of being transported via [facility]. Son provided with discharge instructions to give to facility.

## 2020-04-24 NOTE — Progress Notes (Signed)
Report called to [facility] RN.

## 2020-04-24 NOTE — TOC Transition Note (Signed)
Transition of Care [facility]) - CM/SW Discharge Note   Patient Details  Name: Sherri Pratt MRN: [id number] Date of Birth: 11-30-1945  Transition of Care [facility]) CM/SW Contact:  Bartholome Bill, RN Phone Number: 04/24/2020, 1:52 PM   Clinical Narrative:    Pt to dc to [facility] today. Pt will go to room 206. RN to call report to [phone]. DC summary sent in the Hub. [facility] to be contacted for report.     Barriers to Discharge: SNF Pending bed offer   Patient Goals and CMS Choice   CMS Medicare.gov Compare Post Acute Care list provided to:: Patient Choice offered to / list presented to : Patient  Discharge Placement        Discharge Plan and Services   Discharge Planning Services: CM Consult Post Acute Care Choice: Skilled Nursing Facility            Social Determinants of Health (SDOH) Interventions     Readmission Risk Interventions No flowsheet data found.

## 2020-04-25 DIAGNOSIS — L89212 Pressure ulcer of right hip, stage 2: Secondary | ICD-10-CM | POA: Diagnosis not present

## 2020-04-25 DIAGNOSIS — L89892 Pressure ulcer of other site, stage 2: Secondary | ICD-10-CM | POA: Diagnosis not present

## 2020-04-25 DIAGNOSIS — L89312 Pressure ulcer of right buttock, stage 2: Secondary | ICD-10-CM | POA: Diagnosis not present

## 2020-04-30 DIAGNOSIS — I10 Essential (primary) hypertension: Secondary | ICD-10-CM | POA: Diagnosis not present

## 2020-04-30 DIAGNOSIS — E876 Hypokalemia: Secondary | ICD-10-CM | POA: Diagnosis not present

## 2020-04-30 DIAGNOSIS — E1121 Type 2 diabetes mellitus with diabetic nephropathy: Secondary | ICD-10-CM | POA: Diagnosis not present

## 2020-04-30 DIAGNOSIS — A403 Sepsis due to Streptococcus pneumoniae: Secondary | ICD-10-CM | POA: Diagnosis not present

## 2020-05-02 DIAGNOSIS — L89212 Pressure ulcer of right hip, stage 2: Secondary | ICD-10-CM | POA: Diagnosis not present

## 2020-05-02 DIAGNOSIS — L89892 Pressure ulcer of other site, stage 2: Secondary | ICD-10-CM | POA: Diagnosis not present

## 2020-05-02 DIAGNOSIS — L89312 Pressure ulcer of right buttock, stage 2: Secondary | ICD-10-CM | POA: Diagnosis not present

## 2020-05-15 ENCOUNTER — Inpatient Hospital Stay: Payer: Medicare Other | Admitting: Adult Health

## 2020-05-17 ENCOUNTER — Ambulatory Visit (INDEPENDENT_AMBULATORY_CARE_PROVIDER_SITE_OTHER): Payer: Medicare Other | Admitting: Adult Health

## 2020-05-17 ENCOUNTER — Encounter: Payer: Self-pay | Admitting: Adult Health

## 2020-05-17 ENCOUNTER — Ambulatory Visit
Admission: RE | Admit: 2020-05-17 | Discharge: 2020-05-17 | Disposition: A | Discharge status: Home / Self Care | Payer: Medicare Other | Source: Ambulatory Visit | Attending: Adult Health | Admitting: Adult Health

## 2020-05-17 ENCOUNTER — Other Ambulatory Visit: Payer: Self-pay

## 2020-05-17 VITALS — BP 134/76 | HR 67 | Temp 98.2°F | Wt 204.0 lb

## 2020-05-17 DIAGNOSIS — G20 Parkinson's disease: Secondary | ICD-10-CM

## 2020-05-17 DIAGNOSIS — A419 Sepsis, unspecified organism: Secondary | ICD-10-CM

## 2020-05-17 DIAGNOSIS — R1310 Dysphagia, unspecified: Secondary | ICD-10-CM | POA: Diagnosis not present

## 2020-05-17 DIAGNOSIS — J189 Pneumonia, unspecified organism: Secondary | ICD-10-CM

## 2020-05-17 DIAGNOSIS — E11649 Type 2 diabetes mellitus with hypoglycemia without coma: Secondary | ICD-10-CM

## 2020-05-17 DIAGNOSIS — I480 Paroxysmal atrial fibrillation: Secondary | ICD-10-CM

## 2020-05-17 NOTE — Progress Notes (Signed)
Subjective:    Patient ID: Sherri Pratt, female    DOB: 03-Sep-1946, 74 y.o.   MRN: [id number]  HPI  74 year old female who  has a past medical history of Anemia, Atrial fibrillation (HCC), Chronic kidney disease, Diabetes mellitus, Gout, Hyperlipidemia, Hypertension, and Parkinson's disease (HCC).  She presents to the office today for TCM visit   Admit Date 04/18/2020 Discharge Date 04/24/2020 Discharge from SNF:   She was admitted to [facility] on 04/18/2020 with severe sepsis due to suspected right lower lobe pneumonia after presenting from home to the emergency room for confusion.  The patient's son who was at bedside reported that he had previously visited his mother at home on 04/14/2020 at which time he noted the patient to be at her baseline mental status.  When he visited again on 7 July he noted interval development of confusion as well as worsening somnolence.  The patient reported subjective fever over the previous day but was otherwise without acute complaint.  Hospital Course -Sepsis due to lobular pneumonia, right lower lobe, complicated by acute metabolic encephalopathy. -Was admitted to hospital with fever, leukocytosis, and encephalopathy in the setting of right lower lobe pneumonia.  She was started on broad-spectrum IV antibiotics with improvement in her mentation and clinical status.  She was eventually transitioned to oral antibiotics with cefpodoxime and doxycycline and completed 5 days while hospitalized.  Paroxysmal atrial fibrillation -she was continued on home medications of Eliquis and metoprolol  Acute kidney injury on chronic kidney disease stage IIIb-creatinine 1.3 on admission, improved to her baseline with fluids  Essential hypertension-continued on home medication  Type 2 diabetes-continued on home medications  Parkinson's disease-continued on home Sinemet  Gout-continued on home allopurinol  Hyponatremia - resolved with IV fluids   She presents to the  office today with her daughter in law [name]).   Since being discharged from skilled nursing facility she reports that she feels much improved, she continues to be slightly fatigued at times especially with walking long distances.  She has not experienced any chest pain, shortness of breath, fevers, chills, or productive cough.  It was mentioned that while at the rehab facility she had a swallow test done which family reports that she passed with "minced food".  But that the patient continues to have difficulty swallowing and will sometimes feel as though she is getting choked up when she eats. Denies nausea or vomiting.     Review of Systems  Constitutional: Negative.   HENT: Negative.   Eyes: Negative.   Respiratory: Negative.   Cardiovascular: Negative.   Gastrointestinal: Negative.   Endocrine: Negative.   Genitourinary: Negative.   Musculoskeletal: Negative.   Skin: Negative.   Allergic/Immunologic: Negative.   Neurological: Positive for tremors.  Hematological: Negative.   Psychiatric/Behavioral: Negative.   All other systems reviewed and are negative.  Past Medical History:  Diagnosis Date   Anemia    Atrial fibrillation (HCC)    chronically on Eliquis   Chronic kidney disease    Diabetes mellitus    Gout    Hyperlipidemia    Hypertension    Parkinson's disease (HCC)     Social History   Socioeconomic History   Marital status: Married    Spouse name: Not on file   Number of children: Not on file   Years of education: Not on file   Highest education level: Not on file  Occupational History   Not on file  Tobacco Use  Smoking status: Never Smoker   Smokeless tobacco: Never Used  Substance and Sexual Activity   Alcohol use: No   Drug use: No   Sexual activity: Not on file  Other Topics Concern   Not on file  Social History Narrative   Retired - She sold insurance   Married for 51 years    Three children - all in [place]      She likes  to quilting and shopping.    Social Determinants of Health   Financial Resource Strain:    Difficulty of Paying Living Expenses:   Food Insecurity:    Worried About Programme researcher, broadcasting/film/video in the Last Year:    Barista in the Last Year:   Transportation Needs:    Freight forwarder (Medical):    Lack of Transportation (Non-Medical):   Physical Activity:    Days of Exercise per Week:    Minutes of Exercise per Session:   Stress:    Feeling of Stress :   Social Connections:    Frequency of Communication with Friends and Family:    Frequency of Social Gatherings with Friends and Family:    Attends Religious Services:    Active Member of Clubs or Organizations:    Attends Engineer, structural:    Marital Status:   Intimate Partner Violence:    Fear of Current or Ex-Partner:    Emotionally Abused:    Physically Abused:    Sexually Abused:     Past Surgical History:  Procedure Laterality Date   APPENDECTOMY      Family History  Problem Relation Age of Onset   Cancer Mother        colon   Hypertension Mother    Cancer Father        colon   Healthy Sister    Cancer Brother    Healthy Child     Allergies  Allergen Reactions   Peanut-Containing Drug Products     unknown    Current Outpatient Medications on File Prior to Visit  Medication Sig Dispense Refill   allopurinol (ZYLOPRIM) 300 MG tablet Take 1 tablet (300 mg total) by mouth daily. 90 tablet 3   carbidopa-levodopa (SINEMET IR) 25-100 MG tablet Take 2 tablets at 8:30am/2 tablets at 12:30pm/1 tablet at 4:30pm (Patient taking differently: Take 1-2 tablets by mouth See admin instructions. Take 2 tablets by mouth at 8:30am,2 tablets by mouth at 12:30pm then take 1 tablet by mouth at 4:30pm) 450 tablet 1   ELIQUIS 5 MG TABS tablet TAKE 1 TABLET BY MOUTH TWICE A DAY 60 tablet 6   glipiZIDE (GLUCOTROL XL) 2.5 MG 24 hr tablet Take 1 tablet (2.5 mg total) by mouth daily with  breakfast. 90 tablet 1   metoprolol tartrate (LOPRESSOR) 25 MG tablet TAKE 1/2 TABLET BY MOUTH TWICE DAILY (Patient taking differently: Take 12.5 mg by mouth 2 (two) times daily. ) 90 tablet 1   simvastatin (ZOCOR) 20 MG tablet Take 1 tablet (20 mg total) by mouth at bedtime. 90 tablet 3   No current facility-administered medications on file prior to visit.    BP 134/76    Pulse 67    Temp 98.2 F (36.8 C)    Wt 204 lb (92.5 kg)    SpO2 98%    BMI 33.95 kg/m       Objective:   Physical Exam Vitals and nursing note reviewed.  Constitutional:      Appearance: Normal appearance.  She is obese.  HENT:     Mouth/Throat:     Mouth: Mucous membranes are moist.     Pharynx: Oropharynx is clear.  Neck:     Vascular: No carotid bruit.  Cardiovascular:     Rate and Rhythm: Normal rate and regular rhythm.     Pulses: Normal pulses.     Heart sounds: Normal heart sounds.  Pulmonary:     Effort: Pulmonary effort is normal.     Breath sounds: Normal breath sounds.  Musculoskeletal:        General: Normal range of motion.     Cervical back: Normal range of motion and neck supple. No rigidity or tenderness.  Lymphadenopathy:     Cervical: No cervical adenopathy.  Skin:    General: Skin is warm and dry.  Neurological:     General: No focal deficit present.     Mental Status: She is alert and oriented to person, place, and time.     Motor: Tremor (upper extremities ) present.     Coordination: Coordination abnormal.     Gait: Gait abnormal.     Comments: Slow unsteady gate with assistance of family   Psychiatric:        Mood and Affect: Mood normal.        Behavior: Behavior normal.        Thought Content: Thought content normal.        Judgment: Judgment normal.       Assessment & Plan:  1. Pneumonia of right middle lobe due to infectious organism -Reviewed hospital admission and discharge notes, labs, and imaging with the patient and family.  All questions answered to the best  of my ability -Repeat chest x-ray since it has been about 1 month.  Encourage to continue to be as active as possible to help with full recovery.  When she is sitting down watching TV I advised her to walk during the commercials.  Follow-up with any concerns in the office - DG Chest 2 View; Future - CBC with Differential/Platelet; Future - Basic Metabolic Panel; Future - Basic Metabolic Panel - CBC with Differential/Platelet  2. Dysphagia, unspecified type -Questionable aspiration causing pneumonia?  Will refer to GI for further evaluation and possible swallow study - Ambulatory referral to Gastroenterology  3. Type 2 diabetes mellitus with hypoglycemia without coma, without long-term current use of insulin (HCC) Continue glipizide.  A1c 7.0 in the hospital  4. Sepsis without acute organ dysfunction, due to unspecified organism (HCC) - CBC with Differential/Platelet; Future - Basic Metabolic Panel; Future - Basic Metabolic Panel - CBC with Differential/Platelet  5. Paroxysmal atrial fibrillation (HCC) -Continue Eliquis and beta-blocker.  Follow-up with cardiology as directed  6. Parkinson's disease (HCC) - Continue Sinumet  [name], NP

## 2020-05-18 ENCOUNTER — Telehealth: Payer: Self-pay | Admitting: Adult Health

## 2020-05-18 LAB — CBC WITH DIFFERENTIAL/PLATELET
Absolute Monocytes: 698 cells/uL (ref 200–950)
Basophils Absolute: 58 cells/uL (ref 0–200)
Basophils Relative: 0.6 %
Eosinophils Absolute: 262 cells/uL (ref 15–500)
Eosinophils Relative: 2.7 %
HCT: 37 % (ref 35.0–45.0)
Hemoglobin: 12.1 g/dL (ref 11.7–15.5)
Lymphs Abs: 2037 cells/uL (ref 850–3900)
MCH: 27.7 pg (ref 27.0–33.0)
MCHC: 32.7 g/dL (ref 32.0–36.0)
MCV: 84.7 fL (ref 80.0–100.0)
MPV: 11.4 fL (ref 7.5–12.5)
Monocytes Relative: 7.2 %
Neutro Abs: 6645 cells/uL (ref 1500–7800)
Neutrophils Relative %: 68.5 %
Platelets: 207 10*3/uL (ref 140–400)
RBC: 4.37 10*6/uL (ref 3.80–5.10)
RDW: 14.5 % (ref 11.0–15.0)
Total Lymphocyte: 21 %
WBC: 9.7 10*3/uL (ref 3.8–10.8)

## 2020-05-18 LAB — BASIC METABOLIC PANEL
BUN/Creatinine Ratio: 37 (calc) — ABNORMAL HIGH (ref 6–22)
BUN: 35 mg/dL — ABNORMAL HIGH (ref 7–25)
CO2: 24 mmol/L (ref 20–32)
Calcium: 9.5 mg/dL (ref 8.6–10.4)
Chloride: 105 mmol/L (ref 98–110)
Creat: 0.94 mg/dL — ABNORMAL HIGH (ref 0.60–0.93)
Glucose, Bld: 95 mg/dL (ref 65–99)
Potassium: 4.3 mmol/L (ref 3.5–5.3)
Sodium: 139 mmol/L (ref 135–146)

## 2020-05-18 NOTE — Telephone Encounter (Signed)
Updated patient on labs and xray

## 2020-05-21 ENCOUNTER — Encounter: Payer: Self-pay | Admitting: Gastroenterology

## 2020-05-22 ENCOUNTER — Telehealth: Payer: Self-pay | Admitting: Adult Health

## 2020-05-22 NOTE — Telephone Encounter (Signed)
Called and spoke to [facility].  They need a new order faxed to [phone] for PT and OT.  What dx code would you like to use?

## 2020-05-22 NOTE — Telephone Encounter (Signed)
Pt needs an order reinstated for [facility] home health for home health PT and OT  .  pt discharged from the hospital to rehab. would you please give a verbal  order  by calling [phone]

## 2020-05-22 NOTE — Telephone Encounter (Signed)
Ok to order 

## 2020-05-22 NOTE — Telephone Encounter (Signed)
Parkinsons disease and gait instability

## 2020-05-23 ENCOUNTER — Encounter: Payer: Self-pay | Admitting: Family Medicine

## 2020-05-23 NOTE — Telephone Encounter (Signed)
Order faxed and received confirmation the transmission was successful.  Nothing further needed.

## 2020-07-06 ENCOUNTER — Emergency Department (HOSPITAL_COMMUNITY): Payer: Medicare Other

## 2020-07-06 ENCOUNTER — Telehealth: Payer: Self-pay | Admitting: Adult Health

## 2020-07-06 ENCOUNTER — Inpatient Hospital Stay (HOSPITAL_COMMUNITY)
Admission: EM | Admit: 2020-07-06 | Discharge: 2020-07-10 | DRG: 565 | Disposition: A | Discharge status: Skilled Nursing Facility | Payer: Medicare Other | Attending: Internal Medicine | Admitting: Internal Medicine

## 2020-07-06 DIAGNOSIS — M109 Gout, unspecified: Secondary | ICD-10-CM | POA: Diagnosis present

## 2020-07-06 DIAGNOSIS — M6281 Muscle weakness (generalized): Secondary | ICD-10-CM | POA: Diagnosis not present

## 2020-07-06 DIAGNOSIS — S199XXA Unspecified injury of neck, initial encounter: Secondary | ICD-10-CM | POA: Diagnosis not present

## 2020-07-06 DIAGNOSIS — G20 Parkinson's disease: Secondary | ICD-10-CM | POA: Diagnosis present

## 2020-07-06 DIAGNOSIS — I480 Paroxysmal atrial fibrillation: Secondary | ICD-10-CM | POA: Diagnosis present

## 2020-07-06 DIAGNOSIS — E119 Type 2 diabetes mellitus without complications: Secondary | ICD-10-CM

## 2020-07-06 DIAGNOSIS — R296 Repeated falls: Secondary | ICD-10-CM | POA: Diagnosis present

## 2020-07-06 DIAGNOSIS — M2550 Pain in unspecified joint: Secondary | ICD-10-CM | POA: Diagnosis not present

## 2020-07-06 DIAGNOSIS — E785 Hyperlipidemia, unspecified: Secondary | ICD-10-CM | POA: Diagnosis present

## 2020-07-06 DIAGNOSIS — W1812XA Fall from or off toilet with subsequent striking against object, initial encounter: Secondary | ICD-10-CM | POA: Diagnosis present

## 2020-07-06 DIAGNOSIS — Z7901 Long term (current) use of anticoagulants: Secondary | ICD-10-CM | POA: Diagnosis not present

## 2020-07-06 DIAGNOSIS — N179 Acute kidney failure, unspecified: Secondary | ICD-10-CM | POA: Diagnosis not present

## 2020-07-06 DIAGNOSIS — T796XXA Traumatic ischemia of muscle, initial encounter: Principal | ICD-10-CM | POA: Diagnosis present

## 2020-07-06 DIAGNOSIS — Z91010 Allergy to peanuts: Secondary | ICD-10-CM | POA: Diagnosis not present

## 2020-07-06 DIAGNOSIS — R5381 Other malaise: Secondary | ICD-10-CM | POA: Diagnosis not present

## 2020-07-06 DIAGNOSIS — Z7984 Long term (current) use of oral hypoglycemic drugs: Secondary | ICD-10-CM | POA: Diagnosis not present

## 2020-07-06 DIAGNOSIS — M4802 Spinal stenosis, cervical region: Secondary | ICD-10-CM | POA: Diagnosis not present

## 2020-07-06 DIAGNOSIS — Z800 Family history of malignant neoplasm of digestive organs: Secondary | ICD-10-CM | POA: Diagnosis not present

## 2020-07-06 DIAGNOSIS — R2681 Unsteadiness on feet: Secondary | ICD-10-CM | POA: Diagnosis not present

## 2020-07-06 DIAGNOSIS — R41841 Cognitive communication deficit: Secondary | ICD-10-CM | POA: Diagnosis not present

## 2020-07-06 DIAGNOSIS — Z6830 Body mass index (BMI) 30.0-30.9, adult: Secondary | ICD-10-CM | POA: Diagnosis not present

## 2020-07-06 DIAGNOSIS — F0280 Dementia in other diseases classified elsewhere without behavioral disturbance: Secondary | ICD-10-CM | POA: Diagnosis not present

## 2020-07-06 DIAGNOSIS — Z8249 Family history of ischemic heart disease and other diseases of the circulatory system: Secondary | ICD-10-CM

## 2020-07-06 DIAGNOSIS — R1312 Dysphagia, oropharyngeal phase: Secondary | ICD-10-CM | POA: Diagnosis not present

## 2020-07-06 DIAGNOSIS — W19XXXA Unspecified fall, initial encounter: Secondary | ICD-10-CM | POA: Diagnosis not present

## 2020-07-06 DIAGNOSIS — M6282 Rhabdomyolysis: Secondary | ICD-10-CM | POA: Diagnosis not present

## 2020-07-06 DIAGNOSIS — Z20822 Contact with and (suspected) exposure to covid-19: Secondary | ICD-10-CM | POA: Diagnosis present

## 2020-07-06 DIAGNOSIS — E079 Disorder of thyroid, unspecified: Secondary | ICD-10-CM | POA: Diagnosis not present

## 2020-07-06 DIAGNOSIS — Z79899 Other long term (current) drug therapy: Secondary | ICD-10-CM | POA: Diagnosis not present

## 2020-07-06 DIAGNOSIS — G20A1 Parkinson's disease without dyskinesia, without mention of fluctuations: Secondary | ICD-10-CM | POA: Diagnosis present

## 2020-07-06 DIAGNOSIS — Y92012 Bathroom of single-family (private) house as the place of occurrence of the external cause: Secondary | ICD-10-CM | POA: Diagnosis not present

## 2020-07-06 DIAGNOSIS — E782 Mixed hyperlipidemia: Secondary | ICD-10-CM | POA: Diagnosis not present

## 2020-07-06 DIAGNOSIS — E042 Nontoxic multinodular goiter: Secondary | ICD-10-CM | POA: Diagnosis not present

## 2020-07-06 DIAGNOSIS — E669 Obesity, unspecified: Secondary | ICD-10-CM | POA: Diagnosis not present

## 2020-07-06 DIAGNOSIS — R2689 Other abnormalities of gait and mobility: Secondary | ICD-10-CM | POA: Diagnosis not present

## 2020-07-06 DIAGNOSIS — Z7401 Bed confinement status: Secondary | ICD-10-CM | POA: Diagnosis not present

## 2020-07-06 DIAGNOSIS — E1165 Type 2 diabetes mellitus with hyperglycemia: Secondary | ICD-10-CM | POA: Diagnosis not present

## 2020-07-06 DIAGNOSIS — J321 Chronic frontal sinusitis: Secondary | ICD-10-CM | POA: Diagnosis not present

## 2020-07-06 DIAGNOSIS — I10 Essential (primary) hypertension: Secondary | ICD-10-CM | POA: Diagnosis not present

## 2020-07-06 DIAGNOSIS — R7401 Elevation of levels of liver transaminase levels: Secondary | ICD-10-CM | POA: Diagnosis not present

## 2020-07-06 DIAGNOSIS — E1100 Type 2 diabetes mellitus with hyperosmolarity without nonketotic hyperglycemic-hyperosmolar coma (NKHHC): Secondary | ICD-10-CM | POA: Diagnosis not present

## 2020-07-06 DIAGNOSIS — R531 Weakness: Secondary | ICD-10-CM | POA: Diagnosis not present

## 2020-07-06 MED ORDER — SODIUM CHLORIDE 0.9 % IV BOLUS
1000.0000 mL | Freq: Once | INTRAVENOUS | Status: AC
  Administered 2020-07-07: 1000 mL via INTRAVENOUS

## 2020-07-06 MED ORDER — ONDANSETRON HCL 4 MG PO TABS: 4.0000 mg | ORAL_TABLET | Freq: Four times a day (QID) | ORAL | Status: DC | PRN

## 2020-07-06 MED ORDER — INSULIN ASPART 100 UNIT/ML ~~LOC~~ SOLN
0.0000 [IU] | Freq: Three times a day (TID) | SUBCUTANEOUS | Status: DC
  Administered 2020-07-07: 3 [IU] via SUBCUTANEOUS
  Administered 2020-07-09 – 2020-07-10 (×3): 2 [IU] via SUBCUTANEOUS

## 2020-07-06 MED ORDER — SODIUM CHLORIDE 0.9 % IV BOLUS
1000.0000 mL | Freq: Once | INTRAVENOUS | Status: AC
  Administered 2020-07-06: 1000 mL via INTRAVENOUS

## 2020-07-06 MED ORDER — SIMVASTATIN 20 MG PO TABS: 20.0000 mg | ORAL_TABLET | Freq: Every day | ORAL | Status: DC

## 2020-07-06 MED ORDER — INSULIN ASPART 100 UNIT/ML ~~LOC~~ SOLN: 0.0000 [IU] | Freq: Every day | SUBCUTANEOUS | Status: DC

## 2020-07-06 MED ORDER — CARBIDOPA-LEVODOPA 25-100 MG PO TABS
1.0000 | ORAL_TABLET | Freq: Two times a day (BID) | ORAL | Status: DC
  Filled 2020-07-06 (×2): qty 1

## 2020-07-06 MED ORDER — ACETAMINOPHEN 325 MG PO TABS: 650.0000 mg | ORAL_TABLET | Freq: Four times a day (QID) | ORAL | Status: DC | PRN

## 2020-07-06 MED ORDER — ONDANSETRON HCL 4 MG/2ML IJ SOLN: 4.0000 mg | Freq: Four times a day (QID) | INTRAMUSCULAR | Status: DC | PRN

## 2020-07-06 MED ORDER — APIXABAN 5 MG PO TABS
5.0000 mg | ORAL_TABLET | Freq: Two times a day (BID) | ORAL | Status: DC
  Administered 2020-07-07 – 2020-07-10 (×7): 5 mg via ORAL
  Filled 2020-07-06 (×7): qty 1

## 2020-07-06 MED ORDER — ACETAMINOPHEN 650 MG RE SUPP: 650.0000 mg | Freq: Four times a day (QID) | RECTAL | Status: DC | PRN

## 2020-07-06 MED ORDER — ALLOPURINOL 300 MG PO TABS
300.0000 mg | ORAL_TABLET | Freq: Every day | ORAL | Status: DC
  Administered 2020-07-07 – 2020-07-10 (×4): 300 mg via ORAL
  Filled 2020-07-06 (×5): qty 1

## 2020-07-06 MED ORDER — SODIUM CHLORIDE 0.9 % IV SOLN: INTRAVENOUS | Status: DC

## 2020-07-06 NOTE — ED Notes (Signed)
Patient from home after a ground level fall off toilet. Believes she hit her head on left side, no LOC and no visible head trauma. Pt A&Ox4. VSS. History of HTN, DM, Parkinsons, Afib on Eliquis.

## 2020-07-06 NOTE — Telephone Encounter (Signed)
Joe called to say pt fell when she was on the toilet. She went forward and bumped her head and he wants to make sure she doesn't have a concussion. Transferred to the triage nurse.  Please advise

## 2020-07-06 NOTE — ED Notes (Signed)
Son Sherri Pratt would like updates on mother [phone]

## 2020-07-06 NOTE — H&P (Addendum)
History and Physical    Sherri Pratt GMW:[id number] DOB: 15-Sep-1946 DOA: 04/18/2020  PCP: [name], NP  Patient coming from: Home  I have personally briefly reviewed patient's old medical records in [facility]  Chief Complaint: Fall  HPI: Sherri Pratt is a 74 y.o. female with medical history significant of PAF on eliquis, DM2, HTN, gout, parkinson's dz.  Pt with multiple falls recently.  Today had fall off of toilet.  On ground for "a while".  Husband is demented and was unable to help pt get up.  EMS finally contacted and pt brought to ED.  No specific complaints in ED.  No LOC with fall.  Did hit head.   ED Course: CT head neg.  Pt with rhabdo with CPK 6000, mild AKI with creat 1.5 up from 1.0 baseline.  Review of Systems: As per HPI, otherwise all review of systems negative.  Past Medical History:  Diagnosis Date  . Anemia   . Atrial fibrillation (HCC)    chronically on Eliquis  . Chronic kidney disease   . Diabetes mellitus   . Gout   . Hyperlipidemia   . Hypertension   . Parkinson's disease [facility])     Past Surgical History:  Procedure Laterality Date  . APPENDECTOMY       reports that she has never smoked. She has never used smokeless tobacco. She reports that she does not drink alcohol and does not use drugs.  Allergies  Allergen Reactions  . Peanut-Containing Drug Products     unknown    Family History  Problem Relation Age of Onset  . Cancer Mother        colon  . Hypertension Mother   . Cancer Father        colon  . Healthy Sister   . Cancer Brother   . Healthy Child      Prior to Admission medications   Medication Sig Start Date End Date Taking? Authorizing Provider  allopurinol (ZYLOPRIM) 300 MG tablet Take 1 tablet (300 mg total) by mouth daily. 02/24/20  Yes [name], Kandee Keen, NP  carbidopa-levodopa (SINEMET IR) 25-100 MG tablet Take 2 tablets at 8:30am/2 tablets at 12:30pm/1 tablet at 4:30pm Patient taking differently: Take  1-2 tablets by mouth See admin instructions. Take 2 tablets by mouth at 8:30am,2 tablets by mouth at 12:30pm then take 1 tablet by mouth at 4:30pm 04/12/20  Yes Tat, Rebecca S, DO  ELIQUIS 5 MG TABS tablet TAKE 1 TABLET BY MOUTH TWICE A DAY 09/20/19  Yes Croitoru, Mihai, MD  glipiZIDE (GLUCOTROL XL) 2.5 MG 24 hr tablet Take 1 tablet (2.5 mg total) by mouth daily with breakfast. 02/24/20  Yes [name], Kandee Keen, NP  metoprolol tartrate (LOPRESSOR) 25 MG tablet TAKE 1/2 TABLET BY MOUTH TWICE DAILY Patient taking differently: Take 12.5 mg by mouth 2 (two) times daily.  07/07/19  Yes Croitoru, Mihai, MD  simvastatin (ZOCOR) 20 MG tablet Take 1 tablet (20 mg total) by mouth at bedtime. 02/24/20  Yes [name], NP    Physical Exam: Vitals:   04/23/20 2143 04/24/20 0655 04/24/20 1011 04/24/20 1306  BP: (!) 147/66 (!) 145/91 120/69 118/61  Pulse: 68 64 66 70  Resp: 17 17  18   Temp: 98 F (36.7 C) 97.6 F (36.4 C)  98 F (36.7 C)  TempSrc: Oral Oral  Oral  SpO2: 97% 99%  95%  Weight:      Height:        Constitutional: NAD, calm,  comfortable Eyes: PERRL, lids and conjunctivae normal ENMT: Mucous membranes are moist. Posterior pharynx clear of any exudate or lesions.Normal dentition.  Neck: normal, supple, no masses, no thyromegaly Respiratory: clear to auscultation bilaterally, no wheezing, no crackles. Normal respiratory effort. No accessory muscle use.  Cardiovascular: Regular rate and rhythm, no murmurs / rubs / gallops. No extremity edema. 2+ pedal pulses. No carotid bruits.  Abdomen: no tenderness, no masses palpated. No hepatosplenomegaly. Bowel sounds positive.  Musculoskeletal: no clubbing / cyanosis. No joint deformity upper and lower extremities. Good ROM, no contractures. Normal muscle tone.  Skin: no rashes, lesions, ulcers. No induration Neurologic: Grossly non-focal.  Flat affect, pill rolling tremor noted. Psychiatric: Normal judgment and insight. Alert and oriented x 3. Normal  mood.    Labs on Admission: I have personally reviewed following labs and imaging studies  CBC: No results for input(s): WBC, NEUTROABS, HGB, HCT, MCV, PLT in the last 168 hours. Basic Metabolic Panel: No results for input(s): NA, K, CL, CO2, GLUCOSE, BUN, CREATININE, CALCIUM, MG, PHOS in the last 168 hours. GFR: CrCl cannot be calculated (Patient's most recent lab result is older than the maximum 21 days allowed.). Liver Function Tests: No results for input(s): AST, ALT, ALKPHOS, BILITOT, PROT, ALBUMIN in the last 168 hours. No results for input(s): LIPASE, AMYLASE in the last 168 hours. No results for input(s): AMMONIA in the last 168 hours. Coagulation Profile: No results for input(s): INR, PROTIME in the last 168 hours. Cardiac Enzymes: No results for input(s): CKTOTAL, CKMB, CKMBINDEX, TROPONINI in the last 168 hours. BNP (last 3 results) No results for input(s): PROBNP in the last 8760 hours. HbA1C: No results for input(s): HGBA1C in the last 72 hours. CBG: No results for input(s): GLUCAP in the last 168 hours. Lipid Profile: No results for input(s): CHOL, HDL, LDLCALC, TRIG, CHOLHDL, LDLDIRECT in the last 72 hours. Thyroid Function Tests: No results for input(s): TSH, T4TOTAL, FREET4, T3FREE, THYROIDAB in the last 72 hours. Anemia Panel: No results for input(s): VITAMINB12, FOLATE, FERRITIN, TIBC, IRON, RETICCTPCT in the last 72 hours. Urine analysis:    Component Value Date/Time   COLORURINE YELLOW 04/18/2020 1755   APPEARANCEUR CLEAR 04/18/2020 1755   LABSPEC 1.025 04/18/2020 1755   PHURINE 5.0 04/18/2020 1755   GLUCOSEU 150 (A) 04/18/2020 1755   HGBUR NEGATIVE 04/18/2020 1755   HGBUR negative 11/04/2010 0921   BILIRUBINUR NEGATIVE 04/18/2020 1755   BILIRUBINUR 1+ 09/27/2018 1156   KETONESUR NEGATIVE 04/18/2020 1755   PROTEINUR 30 (A) 04/18/2020 1755   UROBILINOGEN 1.0 09/27/2018 1156   UROBILINOGEN 0.2 11/04/2010 0921   NITRITE NEGATIVE 04/18/2020 1755    LEUKOCYTESUR NEGATIVE 04/18/2020 1755    Radiological Exams on Admission: No results found.  EKG: Independently reviewed.  Assessment/Plan Patient Active Problem List   Diagnosis Date Noted  . AKI (acute kidney injury) (HCC) 07/06/2020  . Traumatic rhabdomyolysis (HCC) 07/06/2020  . PAF (paroxysmal atrial fibrillation) (HCC) 07/06/2020  . DM2 (diabetes mellitus, type 2) (HCC) 07/06/2020  . HTN (hypertension) 07/06/2020  . Parkinson's disease (HCC) 07/06/2020     1. Traumatic rhabdo - 1. IVF: NS at 150 cc/hr 1. 2L NS bolus in ED 2. Repeat BMP in AM 3. Repeat CPK in AM 2. Mild AKI - 1. Strict intake and output 2. IVF as above 3. UA ordered and pending 3. Parkinson's dz - 1. Cont sinemet 2. But frequent falls, flat affect, pill-rolling tremor. 1. ? If its time to increase sinemet dose (currently on just BID). 3.  Will reach out to neurology with secure message for non-emergent consult. 4. Or call them in AM if they havent responded by that time. 5. PT/OT for frequent falls 4. DM2 - 1. Hold glipizide 2. Mod SSI AC/HS 5. HTN - 1. Not on any home meds 6. PAF - 1. Cont eliquis - 2. Other chart suggests shes on metoprolol 12.5 BID, but pharmacy doesn't see any fills for this at local pharmacies. 1. BP borderline low currently 2. Will hold off on ordering metoprolol for the moment  DVT prophylaxis: Eliquis Code Status: Full Family Communication: No family in room Disposition Plan: Home after admit Consults called: Neuro Admission status: Place in 18    Maurie Musco M. DO [facility]  How to contact the [facility] Attending or Consulting provider 7A - 7P or covering provider during after hours 7P -7A, for this patient?  1. Check the care team in [facility] and look for a) attending/consulting [facility] provider listed and b) the [facility] team listed 2. Log into [email or web address]  Amion Physician Scheduling and messaging for groups and whole hospitals  On call and physician scheduling  software for group practices, residents, hospitalists and other medical providers for call, clinic, rotation and shift schedules. On[employer] is a hospital-wide system for scheduling doctors and paging doctors on call. EasyPlot is for scientific plotting and data analysis.  [email or web address]  and use [place]'s universal password to access. If you do not have the password, please contact the hospital operator.  3. Locate the [facility] provider you are looking for under [facility] and page to a number that you can be directly reached. 4. If you still have difficulty reaching the provider, please page the [facility] (Director on Call) for the Hospitalists listed on amion for assistance.  07/06/2020, 11:33 PM

## 2020-07-06 NOTE — ED Provider Notes (Signed)
[facility] EMERGENCY DEPARTMENT Provider Note   CSN: [id number] Arrival date & time: 07/06/20  1743     History Chief Complaint  Patient presents with  . Fall    Sherri Pratt is a 74 y.o. adult past medical history significant for A. fib, Parkinson's, hypertension, diabetes. On anticoagulation.  HPI Patient presents today with chief complaint of fall.  Activated level 2 trauma by triage as she is >65 and on blood thinners.  Her fall happened several hours ago. Unsure when, she states she ;aidon the floor for "awhile" as her husband could not help her up. She states she was using the commode and fell off to her left side because the commode broke.  She hit her head on the door.  She denies loss of consciousness to me.  She states there was blood on the floor, unsure where it was from.  She has a mild headache with pain located in the back of her head. Has worsened since the fall. No medications for her symptoms prior to arrival. Denies blurry vision, neck pain, numbness, tingling, weakness.  Additional history provided by patient's son who is now at the bedside.  He states patient lives at home with her house who has severe dementia.  Patient has had 2 falls in the last x2 days.  After her first fall she laid on the floor for approximately 15 hours because spouse did not know to call 911 or what to do.  Patient's son eventually found her.  Patient has also had very minimal p.o. intake over the last week.  Family does not think she is eating on her own, only when they stop by to bring food.     No past medical history on file.  There are no problems to display for this patient.      OB History   No obstetric history on file.     No family history on file.  Social History   Tobacco Use  . Smoking status: Not on file  Substance Use Topics  . Alcohol use: Not on file  . Drug use: Not on file    Home Medications Prior to Admission medications   Medication  Sig Start Date End Date Taking? Authorizing Provider  allopurinol (ZYLOPRIM) 300 MG tablet Take 300 mg by mouth daily. 05/30/20  Yes [provider]  carbidopa-levodopa (SINEMET IR) 25-100 MG tablet Take 1 tablet by mouth in the morning and at bedtime.  05/30/20  Yes [provider]  ELIQUIS 5 MG TABS tablet Take 5 mg by mouth 2 (two) times daily. 05/30/20  Yes [provider]  glipiZIDE (GLUCOTROL XL) 2.5 MG 24 hr tablet Take 2.5 mg by mouth daily. 05/30/20  Yes [provider]  simvastatin (ZOCOR) 20 MG tablet Take 20 mg by mouth at bedtime. 05/30/20  Yes [provider]    Allergies    Patient has no known allergies.  Review of Systems   Review of Systems All other systems are reviewed and are negative for acute change except as noted in the HPI.  Physical Exam Updated Vital Signs BP 124/76 (BP Location: Right Arm)   Pulse 92   Temp 98.1 F (36.7 C) (Oral)   Resp 18   Ht 5\' 4"  (1.626 m)   Wt 81.6 kg   SpO2 100%   BMI 30.90 kg/m   Physical Exam Vitals and nursing note reviewed.  Constitutional:      General: JANAZIA SCHREIER is  not in acute distress.    Appearance: CHANTAL WORTHEY is not ill-appearing.  HENT:     Head: Normocephalic and atraumatic. No raccoon eyes or Battle's sign.     Jaw: There is normal jaw occlusion.     Comments: No tenderness to palpation of skull. No deformities or crepitus noted. No open wounds, abrasions or lacerations.    Right Ear: Tympanic membrane and external ear normal. No hemotympanum.     Left Ear: Tympanic membrane and external ear normal. No hemotympanum.     Nose: Nose normal.     Right Nostril: No epistaxis or septal hematoma.     Left Nostril: No epistaxis or septal hematoma.     Mouth/Throat:     Mouth: Mucous membranes are moist.     Pharynx: Oropharynx is clear.  Eyes:     General: No scleral icterus.       Right eye: No discharge.        Left eye: No discharge.     Extraocular Movements:  Extraocular movements intact.     Conjunctiva/sclera: Conjunctivae normal.     Pupils: Pupils are equal, round, and reactive to light.     Comments: No pain with EOMs  Neck:     Vascular: No JVD.  Cardiovascular:     Rate and Rhythm: Normal rate and regular rhythm.     Pulses: Normal pulses.          Radial pulses are 2+ on the right side and 2+ on the left side.     Heart sounds: Normal heart sounds.  Pulmonary:     Comments: Lungs clear to auscultation in all fields. Symmetric chest rise. No wheezing, rales, or rhonchi. Abdominal:     Comments: Abdomen is soft, non-distended, and non-tender in all quadrants. No rigidity, no guarding. No peritoneal signs.  Musculoskeletal:        General: Normal range of motion.     Cervical back: Normal range of motion.  Skin:    General: Skin is warm and dry.     Capillary Refill: Capillary refill takes less than 2 seconds.  Neurological:     Mental Status: BIANNEY ROCKWOOD is oriented to person, place, and time.     GCS: GCS eye subscore is 4. GCS verbal subscore is 5. GCS motor subscore is 6.     Comments: Fluent speech, no facial droop.  Tremor noted to left upper extremity, baseline per patient.  Psychiatric:        Behavior: Behavior normal.     ED Results / Procedures / Treatments   Labs (all labs ordered are listed, but only abnormal results are displayed) Labs Reviewed  COMPREHENSIVE METABOLIC PANEL - Abnormal; Notable for the following components:      Result Value   CO2 20 (*)    Glucose, Bld 258 (*)    BUN 48 (*)    Creatinine, Ser 1.56 (*)    AST 136 (*)    ALT 51 (*)    All other components within normal limits  CBC WITH DIFFERENTIAL/PLATELET - Abnormal; Notable for the following components:   WBC 14.5 (*)    Hemoglobin 12.4 (*)    HCT 38.8 (*)    RDW 15.8 (*)    Neutro Abs 12.4 (*)    All other components within normal limits  CK - Abnormal; Notable for the following components:   Total CK 6,270 (*)    All other  components within normal limits  RESPIRATORY PANEL BY RT PCR (FLU A&B, COVID)  URINALYSIS, ROUTINE W REFLEX MICROSCOPIC    EKG None  Radiology CT HEAD WO CONTRAST  Result Date: 07/06/2020 CLINICAL DATA:  Head trauma, minor. EXAM: CT HEAD WITHOUT CONTRAST CT CERVICAL SPINE WITHOUT CONTRAST TECHNIQUE: Multidetector CT imaging of the head and cervical spine was performed following the standard protocol without intravenous contrast. Multiplanar CT image reconstructions of the cervical spine were also generated. COMPARISON:  CT head/cervical spine 07/18/2018. FINDINGS: CT HEAD FINDINGS Brain: Stable, moderate generalized cerebral atrophy. Stable, mild ill-defined hypoattenuation within the cerebral white matter which is nonspecific, but likely reflects sequela of chronic small vessel ischemic disease. There is no acute intracranial hemorrhage. No demarcated cortical infarct. No extra-axial fluid collection. No evidence of intracranial mass. No midline shift. Vascular: No hyperdense vessel.  Atherosclerotic calcifications. Skull: Normal. Negative for fracture or focal lesion. Sinuses/Orbits: Visualized orbits show no acute finding. Mild ethmoid sinus mucosal thickening. Frothy secretions within the left frontal sinus. No significant mastoid effusion. CT CERVICAL SPINE FINDINGS Alignment: A mild cervical levocurvature may be positional. Trace C4-C5 grade 1 anterolisthesis Skull base and vertebrae: The basion-dental and atlanto-dental intervals are maintained.No evidence of acute fracture to the cervical spine. 4 mm sclerotic focus within the left C4 articular pillar, nonspecific but possibly reflecting a bone island. Soft tissues and spinal canal: No prevertebral fluid or swelling. No visible canal hematoma. Disc levels: Cervical spondylosis. Most notably at C5-C6 and C6-C7 there is moderate disc degeneration with posterior disc osteophytes and uncovertebral hypertrophy. The C5-C6 posterior disc osteophyte  contributes to moderate/severe spinal canal stenosis. Moderate/severe spinal canal stenosis at these levels with possible spinal cord mass effect. Upper chest: No consolidation within the imaged lung apices. No visible pneumothorax Other: Multiple thyroid nodules, the largest arising from the right lobe measuring 2.2 cm IMPRESSION: CT head: 1. No evidence of acute intracranial abnormality. 2. Stable, moderate generalized cerebral atrophy. 3. Stable, mild cerebral white matter chronic small vessel ischemic disease. 4. Left frontal sinusitis.  Mild ethmoid sinus mucosal thickening. CT cervical spine: 1. No evidence of acute fracture to the cervical spine. 2. Minimal C4-C5 grade 1 anterolisthesis. 3. Cervical spondylosis greatest at C5-C6 and C6-C7 where prominent posterior disc osteophyte complexes contribute to multifactorial moderate/severe spinal canal stenosis with possible spinal cord mass effect. 4. Multiple thyroid nodules, the largest within the right lobe measuring 2.2 cm. Nonemergent thyroid ultrasound is recommended for further evaluation. Electronically Signed   By: Jackey Loge DO   On: 07/06/2020 18:28   CT Cervical Spine Wo Contrast  Result Date: 07/06/2020 CLINICAL DATA:  Head trauma, minor. EXAM: CT HEAD WITHOUT CONTRAST CT CERVICAL SPINE WITHOUT CONTRAST TECHNIQUE: Multidetector CT imaging of the head and cervical spine was performed following the standard protocol without intravenous contrast. Multiplanar CT image reconstructions of the cervical spine were also generated. COMPARISON:  CT head/cervical spine 07/18/2018. FINDINGS: CT HEAD FINDINGS Brain: Stable, moderate generalized cerebral atrophy. Stable, mild ill-defined hypoattenuation within the cerebral white matter which is nonspecific, but likely reflects sequela of chronic small vessel ischemic disease. There is no acute intracranial hemorrhage. No demarcated cortical infarct. No extra-axial fluid collection. No evidence of intracranial  mass. No midline shift. Vascular: No hyperdense vessel.  Atherosclerotic calcifications. Skull: Normal. Negative for fracture or focal lesion. Sinuses/Orbits: Visualized orbits show no acute finding. Mild ethmoid sinus mucosal thickening. Frothy secretions within the left frontal sinus. No significant mastoid effusion. CT CERVICAL SPINE FINDINGS Alignment: A mild cervical levocurvature may be positional. Trace  C4-C5 grade 1 anterolisthesis Skull base and vertebrae: The basion-dental and atlanto-dental intervals are maintained.No evidence of acute fracture to the cervical spine. 4 mm sclerotic focus within the left C4 articular pillar, nonspecific but possibly reflecting a bone island. Soft tissues and spinal canal: No prevertebral fluid or swelling. No visible canal hematoma. Disc levels: Cervical spondylosis. Most notably at C5-C6 and C6-C7 there is moderate disc degeneration with posterior disc osteophytes and uncovertebral hypertrophy. The C5-C6 posterior disc osteophyte contributes to moderate/severe spinal canal stenosis. Moderate/severe spinal canal stenosis at these levels with possible spinal cord mass effect. Upper chest: No consolidation within the imaged lung apices. No visible pneumothorax Other: Multiple thyroid nodules, the largest arising from the right lobe measuring 2.2 cm IMPRESSION: CT head: 1. No evidence of acute intracranial abnormality. 2. Stable, moderate generalized cerebral atrophy. 3. Stable, mild cerebral white matter chronic small vessel ischemic disease. 4. Left frontal sinusitis.  Mild ethmoid sinus mucosal thickening. CT cervical spine: 1. No evidence of acute fracture to the cervical spine. 2. Minimal C4-C5 grade 1 anterolisthesis. 3. Cervical spondylosis greatest at C5-C6 and C6-C7 where prominent posterior disc osteophyte complexes contribute to multifactorial moderate/severe spinal canal stenosis with possible spinal cord mass effect. 4. Multiple thyroid nodules, the largest  within the right lobe measuring 2.2 cm. Nonemergent thyroid ultrasound is recommended for further evaluation. Electronically Signed   By: Jackey LogeKyle  Golden DO   On: 07/06/2020 18:28   DG Chest Portable 1 View  Result Date: 07/06/2020 CLINICAL DATA:  Weakness EXAM: PORTABLE CHEST 1 VIEW COMPARISON:  None. FINDINGS: Single frontal view of the chest demonstrates an unremarkable cardiac silhouette. No airspace disease, effusion, or pneumothorax. No acute bony abnormality. IMPRESSION: 1. No acute intrathoracic process. Electronically Signed   By: Sharlet SalinaMichael  Brown M.D.   On: 07/06/2020 21:49    Procedures Procedures (including critical care time)  Medications Ordered in ED Medications  sodium chloride 0.9 % bolus 1,000 mL (has no administration in time range)  sodium chloride 0.9 % bolus 1,000 mL (1,000 mLs Intravenous New Bag/Given 07/06/20 2121)    ED Course  I have reviewed the triage vital signs and the nursing notes.  Pertinent labs & imaging results that were available during my care of the patient were reviewed by me and considered in my medical decision making (see chart for details).  Clinical Course as of Jul 07 2323  Fri Jul 06, 2020  1843 Pt presenting with mechanical fall while getting up from toilet, slipped and hit her head on the wall, questionable LOC (brief?), she is on A/C.  She has a headache in the back of her head.  No other trauma or pain.  ON exam she appears comfortable.  She has a parkinsonian tremor at baseline in her arms.  No evident of head contusion or hematoma.  CTH and C-spine negative for acute pathology.   Okay for discharge.   [MT]  2139 Family provided new history concerning for patient's decline in function at home, recurrent falls.  Labs show CK 6270.  BUN 48, Cr 1.56 with no recent baseline.  Likely due to dehydration.  Will need IV fluids and admission.  She has a minor leukocytosis but clinically does not exhibit signs of sepsis or infection including fever.   We'll recheck BP after IV fluids   [MT]    Clinical Course User Index [MT] Trifan, Kermit BaloMatthew J, MD   MDM Rules/Calculators/A&P  History provided by patient with additional history obtained from chart review.    Patient presenting after falling off the commode.  She is on anticoagulation.  Vital signs are stable.  On exam she is alert and oriented x3.  She is able to tell the events of what happened today.  She admits to mild headache.  She has a tremor in left arm which is her baseline Parkinson's tremor.  CN normal. No focal neuro findgs on exam. No open wounds on head, no signs of head trauma.  CT head and cervical spine were negative. Attempted to ambulate patient and she was unable to do so.  She required 2 people to assist her to stand and then she cannot take any steps.  Her son is now at the bedside and states he is concerned as patient has had 2 falls in the last 2 days and she has significantly decreased p.o. intake. She was on the floor for several hours prior to family finding her. Will check basic labs, CK, and UA as well as give IV fluids based on new history provided by family. Son also reports history of UTIs, last on antibiotics x 2 months ago.  Patient afebrile, not septic. CBC with leukocytosis 14.5, hemoglobin 12.4. CMP without significant electrolyte derangement, BUN/Cr 48/1.56, no prior to compare, AST elevated 136 and ALT 51. CK elevated at 6270.  UA not yet collected.  BP when admission consult placed is 104/46. Second liter of IVF ordered. This case was discussed with Dr. Renaye Rakers who has seen the patient and agrees with plan to admit for rhabdo.  Spoke with Dr. Julian Reil with hospitalist service who agrees to assume care of patient and bring into the hospital for further evaluation and management.     Portions of this note were generated with Scientist, clinical (histocompatibility and immunogenetics). Dictation errors may occur despite best attempts at proofreading.   Final Clinical  Impression(s) / ED Diagnoses Final diagnoses:  Traumatic rhabdomyolysis, initial encounter [facility])    Rx / DC Orders ED Discharge Orders    None       Kathyrn Lass 07/06/20 2347    Terald Sleeper, MD 07/07/20 0028

## 2020-07-06 NOTE — ED Notes (Signed)
Patient transported to CT 

## 2020-07-07 DIAGNOSIS — I10 Essential (primary) hypertension: Secondary | ICD-10-CM | POA: Diagnosis present

## 2020-07-07 DIAGNOSIS — W1812XA Fall from or off toilet with subsequent striking against object, initial encounter: Secondary | ICD-10-CM | POA: Diagnosis present

## 2020-07-07 DIAGNOSIS — T796XXA Traumatic ischemia of muscle, initial encounter: Secondary | ICD-10-CM | POA: Diagnosis present

## 2020-07-07 DIAGNOSIS — Z7901 Long term (current) use of anticoagulants: Secondary | ICD-10-CM | POA: Diagnosis not present

## 2020-07-07 DIAGNOSIS — I480 Paroxysmal atrial fibrillation: Secondary | ICD-10-CM | POA: Diagnosis present

## 2020-07-07 DIAGNOSIS — Z7984 Long term (current) use of oral hypoglycemic drugs: Secondary | ICD-10-CM | POA: Diagnosis not present

## 2020-07-07 DIAGNOSIS — R296 Repeated falls: Secondary | ICD-10-CM | POA: Diagnosis present

## 2020-07-07 DIAGNOSIS — E669 Obesity, unspecified: Secondary | ICD-10-CM | POA: Diagnosis present

## 2020-07-07 DIAGNOSIS — G20 Parkinson's disease: Secondary | ICD-10-CM | POA: Diagnosis present

## 2020-07-07 DIAGNOSIS — Z20822 Contact with and (suspected) exposure to covid-19: Secondary | ICD-10-CM | POA: Diagnosis present

## 2020-07-07 DIAGNOSIS — Y92012 Bathroom of single-family (private) house as the place of occurrence of the external cause: Secondary | ICD-10-CM | POA: Diagnosis not present

## 2020-07-07 DIAGNOSIS — E785 Hyperlipidemia, unspecified: Secondary | ICD-10-CM | POA: Diagnosis present

## 2020-07-07 DIAGNOSIS — Z800 Family history of malignant neoplasm of digestive organs: Secondary | ICD-10-CM | POA: Diagnosis not present

## 2020-07-07 DIAGNOSIS — N179 Acute kidney failure, unspecified: Secondary | ICD-10-CM | POA: Diagnosis present

## 2020-07-07 DIAGNOSIS — E119 Type 2 diabetes mellitus without complications: Secondary | ICD-10-CM | POA: Diagnosis present

## 2020-07-07 DIAGNOSIS — Z79899 Other long term (current) drug therapy: Secondary | ICD-10-CM | POA: Diagnosis not present

## 2020-07-07 DIAGNOSIS — F0280 Dementia in other diseases classified elsewhere without behavioral disturbance: Secondary | ICD-10-CM

## 2020-07-07 DIAGNOSIS — Z91010 Allergy to peanuts: Secondary | ICD-10-CM | POA: Diagnosis not present

## 2020-07-07 DIAGNOSIS — E1100 Type 2 diabetes mellitus with hyperosmolarity without nonketotic hyperglycemic-hyperosmolar coma (NKHHC): Secondary | ICD-10-CM | POA: Diagnosis not present

## 2020-07-07 DIAGNOSIS — Z6830 Body mass index (BMI) 30.0-30.9, adult: Secondary | ICD-10-CM | POA: Diagnosis not present

## 2020-07-07 DIAGNOSIS — M109 Gout, unspecified: Secondary | ICD-10-CM | POA: Diagnosis present

## 2020-07-07 DIAGNOSIS — Z8249 Family history of ischemic heart disease and other diseases of the circulatory system: Secondary | ICD-10-CM | POA: Diagnosis not present

## 2020-07-07 LAB — CBG MONITORING, ED: Glucose-Capillary: 154 mg/dL — ABNORMAL HIGH (ref 70–99)

## 2020-07-07 MED ORDER — INFLUENZA VAC A&B SA ADJ QUAD 0.5 ML IM PRSY
0.5000 mL | PREFILLED_SYRINGE | INTRAMUSCULAR | Status: DC
  Filled 2020-07-07: qty 0.5

## 2020-07-07 MED ORDER — CARBIDOPA-LEVODOPA 25-100 MG PO TABS
1.0000 | ORAL_TABLET | Freq: Four times a day (QID) | ORAL | Status: DC
  Administered 2020-07-07 – 2020-07-10 (×15): 1 via ORAL
  Filled 2020-07-07 (×15): qty 1

## 2020-07-07 MED ORDER — PNEUMOCOCCAL VAC POLYVALENT 25 MCG/0.5ML IJ INJ: 0.5000 mL | INJECTION | INTRAMUSCULAR | Status: DC | PRN

## 2020-07-07 MED ORDER — METOPROLOL TARTRATE 25 MG PO TABS: 12.5000 mg | ORAL_TABLET | Freq: Two times a day (BID) | ORAL | Status: DC

## 2020-07-07 NOTE — Progress Notes (Signed)
Occupational Therapy Evaluation Patient Details Name: Sherri Pratt MRN: [id number] DOB: 07/30/1946 Today's Date: 07/07/2020    History of Present Illness 74 y.o. female admitted with recurrent falls, rhabdo, and AKI. PMHx significant for PAF on eliquis, DM2, HTN, gout, parkinson's dz.   Clinical Impression   PTA patient was living with her spouse in a single-level private residence and was Mod I with use of DME for BADLs. Patient was also caring for her husband who has dementia. Patient currently presents below baseline level of function requiring Mod A +2 for BADL transfers and LB BADLs with use of RW. Patient would benefit from continued acute OT services to maximize safety and independence with self-care tasks, decrease risk of falls, and decrease caregiver burden. Given patients CLOF, recommendation for d/c to SNF rehab. Patient and son in agreement with preference for [facility].     Follow Up Recommendations  SNF    Equipment Recommendations  Other (comment) (Defer to next level of care)    Recommendations for Other Services       Precautions / Restrictions Precautions Precautions: Fall Precaution Comments: Recurrent falls without injury Restrictions Weight Bearing Restrictions: No      Mobility Bed Mobility Overal bed mobility: Needs Assistance Bed Mobility: Supine to Sit     Supine to sit: Max assist     General bed mobility comments: Max a to scoot hips to the edge of the bed. Once sitting min/mod a to maintain sitting at the edge of the bed. Patient cosnitently fell to the left.   Transfers Overall transfer level: Needs assistance Equipment used: Rolling walker (2 wheeled) Transfers: Sit to/from Stand Sit to Stand: Total assist         General transfer comment: depsite 3 trials and cuing for foot placement and weight shift therapy was unable to get patients bottom off the matress.     Balance Overall balance assessment: Needs assistance Sitting-balance  support: Bilateral upper extremity supported;Feet supported Sitting balance-Leahy Scale: Poor Sitting balance - Comments: therapy had to give min/mod a to keep patient from falling to the left  Postural control: Left lateral lean Standing balance support: Bilateral upper extremity supported Standing balance-Leahy Scale: Zero Standing balance comment: unable to come to standing position                            ADL either performed or assessed with clinical judgement   ADL Overall ADL's : Needs assistance/impaired Eating/Feeding: Minimal assistance;Sitting Eating/Feeding Details (indicate cue type and reason): Patient reports requiring assist with breakfast meal this morning.                      Toilet Transfer: Moderate assistance;+2 for physical assistance;RW Toilet Transfer Details (indicate cue type and reason): Simluated with SPT to recliner with +2 assist                  Vision Baseline Vision/History: Wears glasses Wears Glasses: At all times Patient Visual Report: No change from baseline Vision Assessment?: No apparent visual deficits Additional Comments: Patient able to correctly identify time on wall clock     Perception     Praxis      Pertinent Vitals/Pain Pain Assessment: Faces Faces Pain Scale: Hurts little more Pain Location: No specific location given. Per patient general soreness      Hand Dominance Right   Extremity/Trunk Assessment Upper Extremity Assessment Upper Extremity Assessment: Generalized weakness (RUE >  LUE)   Lower Extremity Assessment Lower Extremity Assessment: Generalized weakness       Communication Communication Communication: No difficulties   Cognition Arousal/Alertness: Awake/alert Behavior During Therapy: Flat affect Overall Cognitive Status: Within Functional Limits for tasks assessed                                     General Comments  Patient with PD demonstrating LUE resting  tremor.     Exercises     Shoulder Instructions      Home Living Family/patient expects to be discharged to:: Private residence Living Arrangements: Spouse/significant other Available Help at Discharge: Family Type of Home: House Home Access: Stairs to enter Secretary/administrator of Steps: 4 Entrance Stairs-Rails: None Home Layout: One level     Bathroom Shower/Tub: Producer, television/film/video: Standard         Additional Comments: Patients son lives nearby. Patients husband has dementia. She is the primary caregiver. Does not have a raised toiet seat or any handles       Prior Functioning/Environment Level of Independence: Independent with assistive device(s)        Comments: used a walker sometimes but had frequent falls         OT Problem List: Decreased strength;Decreased activity tolerance;Impaired balance (sitting and/or standing);Decreased coordination;Decreased safety awareness;Decreased knowledge of use of DME or AE;Impaired sensation      OT Treatment/Interventions: Self-care/ADL training;Energy conservation;DME and/or AE instruction;Therapeutic exercise;Therapeutic activities;Patient/family education;Balance training    OT Goals(Current goals can be found in the care plan section) Acute Rehab OT Goals Patient Stated Goal: to get stronger  OT Goal Formulation: With patient/family Time For Goal Achievement: 07/21/20 Potential to Achieve Goals: Good ADL Goals Pt Will Perform Grooming: sitting;with set-up Pt Will Perform Upper Body Dressing: with set-up;sitting Pt Will Perform Lower Body Dressing: with min assist;with adaptive equipment;sitting/lateral leans;sit to/from stand Pt Will Transfer to Toilet: with min assist;ambulating Pt Will Perform Toileting - Clothing Manipulation and hygiene: with min assist;sitting/lateral leans;sit to/from stand Pt/caregiver will Perform Home Exercise Program: Increased strength;Both right and left upper  extremity;With written HEP provided  OT Frequency: Min 2X/week   Barriers to D/C: Decreased caregiver support          Co-evaluation              AM-PAC OT "6 Clicks" Daily Activity     Outcome Measure Help from another person eating meals?: A Little Help from another person taking care of personal grooming?: A Little Help from another person toileting, which includes using toliet, bedpan, or urinal?: A Lot Help from another person bathing (including washing, rinsing, drying)?: A Lot Help from another person to put on and taking off regular upper body clothing?: A Little Help from another person to put on and taking off regular lower body clothing?: A Lot 6 Click Score: 15   End of Session Equipment Utilized During Treatment: Gait belt;Rolling walker Nurse Communication: Mobility status  Activity Tolerance: Patient tolerated treatment well Patient left: in chair;with call bell/phone within reach;with chair alarm set;with family/visitor present (Son present at bedside)  OT Visit Diagnosis: Unsteadiness on feet (R26.81);Repeated falls (R29.6);Muscle weakness (generalized) (M62.81);Pain                Time: 8527-7824 OT Time Calculation (min): 18 min Charges:  OT General Charges $OT Visit: 1 Visit OT Evaluation $OT Eval Moderate Complexity: 1 Mod  Sherri Pratt OTR/L Supplemental OT, Department of rehab services [phone]  Sherri Crago R H. 07/07/2020, 2:30 PM

## 2020-07-07 NOTE — Consult Note (Addendum)
NEURO HOSPITALIST CONSULT NOTE   Requestig physician: Dr. Julian Reil  Reason for Consult: Increased falls in the setting of Parkinson's disease  History obtained from:  Chart     HPI:                                                                                                                                          Sherri Pratt is an 74 y.o. female with a history of HTN, DM, atrial fibrillation (on Eliquis) and Parkinson's disease who presented to the ED with increased falls. Her most recent fall was off a toilet and she endorsed having hit her head on the left side; there was questionable/brief LOC but or visible head trauma. She did endorse a posterior headache. She had been on the ground "for a while" as her husband, who has dementia, was unable to get her up. EMS was finally contacted and brought her to the ED.   Her CK was elevated at 6270. Her BUN and Cr were also both elevated at 48 and 1.56, felt most likely to be due to dehydration.   Head CT in the ED showed no acute abnormality.   For her Parkinson's disease, she is taking Sinemet IR 25-100 one tablet BID. She is also on Eliquis 5 mg BID.   No past medical history on file.   No family history on file.            Social History:  has no history on file for tobacco use, alcohol use, and drug use.  No Known Allergies  HOME MEDICATIONS:                                                                                                                      No current facility-administered medications on file prior to encounter.   Current Outpatient Medications on File Prior to Encounter  Medication Sig Dispense Refill  . allopurinol (ZYLOPRIM) 300 MG tablet Take 300 mg by mouth daily.    . carbidopa-levodopa (SINEMET IR) 25-100 MG tablet Take 1 tablet by mouth in the morning and at bedtime.     [name] ELIQUIS 5 MG TABS tablet Take 5 mg by mouth 2 (two) times daily.    [name] glipiZIDE (GLUCOTROL XL) 2.5 MG 24 hr  tablet Take 2.5 mg by mouth daily.    . simvastatin (ZOCOR) 20 MG tablet Take 20 mg by mouth at bedtime.       ROS:                                                                                                                                       Unable to obtain due to cognitive/communication deficit.    Blood pressure (!) 93/47, pulse 76, temperature 98.3 F (36.8 C), temperature source Oral, resp. rate 16, height 5\' 4"  (1.626 m), weight 81.6 kg, SpO2 96 %.   General Examination:                                                                                                       Physical Exam  HEENT-  [place]/AT    Lungs- Respirations unlabored Extremities- No edema. Warm and well-perfused  Neurological Examination Mental Status: Asleep initially, she arouses to light sternal rub and calling of her name. When awake, she has poor eye contact, little spontaneous movement, hypomimia, poor attention and impaired orientation (only oriented to hospital, city and state, not to day, month or year). She does endorse that she has Parkinson's but is unable to provide any additional history. Speech is dysarthric in the context of loose dentures that she seems unable to place back in mouth correctly. Limited speech output is fluent. She can follow most simple commands, but frequently requires coaching/repeated requests.  Cranial Nerves: II: PERRL. Temporal visual fields intact to DSS without extinction.   III,IV, VI: No ptosis. Somewhat wide-eyed stare. Will track to left and right with saccadic pursuits.  VII: Facial movement is symmetric in the context of hypomimia.  VIII: Hearing intact to voice IX,X: Mild hypophonia.  XI: Head is midline.  XII: Unable to assess in the context of loose dentures.  Motor: While asleep, tone is normal.  On awakening, coarse resting tremor to bilateral hands is noted, worse on the left. The tremor improves with movement of the affected limb.  BUE strength is  5/5 BLE: Able to elevate antigravity briefly. Did not give good effort when resisting examiner in HF and KE. Withdraws to noxious plantar stimulation with coordinated HF and KF.  Sensory: Subjectively intact to FT and light pinch x 4.  Deep Tendon Reflexes: 1+ bilateral brachioradialis. Could not elicit patellar or achilles reflexes.  Plantars: Equivocal bilaterally  Cerebellar: No ataxia with FNF bilaterally  Gait: Deferred   Lab Results: Basic Metabolic  Panel: Recent Labs  Lab 07/06/20 2003  NA 140  K 4.2  CL 105  CO2 20*  GLUCOSE 258*  BUN 48*  CREATININE 1.56*  CALCIUM 9.5    CBC: Recent Labs  Lab 07/06/20 2003  WBC 14.5*  NEUTROABS 12.4*  HGB 12.4*  HCT 38.8*  MCV 86.4  PLT 208    Cardiac Enzymes: Recent Labs  Lab 07/06/20 2003  CKTOTAL 6,270*    Lipid Panel: No results for input(s): CHOL, TRIG, HDL, CHOLHDL, VLDL, LDLCALC in the last 168 hours.  Imaging: CT HEAD WO CONTRAST  Result Date: 07/06/2020 CLINICAL DATA:  Head trauma, minor. EXAM: CT HEAD WITHOUT CONTRAST CT CERVICAL SPINE WITHOUT CONTRAST TECHNIQUE: Multidetector CT imaging of the head and cervical spine was performed following the standard protocol without intravenous contrast. Multiplanar CT image reconstructions of the cervical spine were also generated. COMPARISON:  CT head/cervical spine 07/18/2018. FINDINGS: CT HEAD FINDINGS Brain: Stable, moderate generalized cerebral atrophy. Stable, mild ill-defined hypoattenuation within the cerebral white matter which is nonspecific, but likely reflects sequela of chronic small vessel ischemic disease. There is no acute intracranial hemorrhage. No demarcated cortical infarct. No extra-axial fluid collection. No evidence of intracranial mass. No midline shift. Vascular: No hyperdense vessel.  Atherosclerotic calcifications. Skull: Normal. Negative for fracture or focal lesion. Sinuses/Orbits: Visualized orbits show no acute finding. Mild ethmoid sinus mucosal  thickening. Frothy secretions within the left frontal sinus. No significant mastoid effusion. CT CERVICAL SPINE FINDINGS Alignment: A mild cervical levocurvature may be positional. Trace C4-C5 grade 1 anterolisthesis Skull base and vertebrae: The basion-dental and atlanto-dental intervals are maintained.No evidence of acute fracture to the cervical spine. 4 mm sclerotic focus within the left C4 articular pillar, nonspecific but possibly reflecting a bone island. Soft tissues and spinal canal: No prevertebral fluid or swelling. No visible canal hematoma. Disc levels: Cervical spondylosis. Most notably at C5-C6 and C6-C7 there is moderate disc degeneration with posterior disc osteophytes and uncovertebral hypertrophy. The C5-C6 posterior disc osteophyte contributes to moderate/severe spinal canal stenosis. Moderate/severe spinal canal stenosis at these levels with possible spinal cord mass effect. Upper chest: No consolidation within the imaged lung apices. No visible pneumothorax Other: Multiple thyroid nodules, the largest arising from the right lobe measuring 2.2 cm IMPRESSION: CT head: 1. No evidence of acute intracranial abnormality. 2. Stable, moderate generalized cerebral atrophy. 3. Stable, mild cerebral white matter chronic small vessel ischemic disease. 4. Left frontal sinusitis.  Mild ethmoid sinus mucosal thickening. CT cervical spine: 1. No evidence of acute fracture to the cervical spine. 2. Minimal C4-C5 grade 1 anterolisthesis. 3. Cervical spondylosis greatest at C5-C6 and C6-C7 where prominent posterior disc osteophyte complexes contribute to multifactorial moderate/severe spinal canal stenosis with possible spinal cord mass effect. 4. Multiple thyroid nodules, the largest within the right lobe measuring 2.2 cm. Nonemergent thyroid ultrasound is recommended for further evaluation. Electronically Signed   By: Jackey LogeKyle  Golden DO   On: 07/06/2020 18:28   CT Cervical Spine Wo Contrast  Result Date:  07/06/2020 CLINICAL DATA:  Head trauma, minor. EXAM: CT HEAD WITHOUT CONTRAST CT CERVICAL SPINE WITHOUT CONTRAST TECHNIQUE: Multidetector CT imaging of the head and cervical spine was performed following the standard protocol without intravenous contrast. Multiplanar CT image reconstructions of the cervical spine were also generated. COMPARISON:  CT head/cervical spine 07/18/2018. FINDINGS: CT HEAD FINDINGS Brain: Stable, moderate generalized cerebral atrophy. Stable, mild ill-defined hypoattenuation within the cerebral white matter which is nonspecific, but likely reflects sequela of chronic small vessel ischemic  disease. There is no acute intracranial hemorrhage. No demarcated cortical infarct. No extra-axial fluid collection. No evidence of intracranial mass. No midline shift. Vascular: No hyperdense vessel.  Atherosclerotic calcifications. Skull: Normal. Negative for fracture or focal lesion. Sinuses/Orbits: Visualized orbits show no acute finding. Mild ethmoid sinus mucosal thickening. Frothy secretions within the left frontal sinus. No significant mastoid effusion. CT CERVICAL SPINE FINDINGS Alignment: A mild cervical levocurvature may be positional. Trace C4-C5 grade 1 anterolisthesis Skull base and vertebrae: The basion-dental and atlanto-dental intervals are maintained.No evidence of acute fracture to the cervical spine. 4 mm sclerotic focus within the left C4 articular pillar, nonspecific but possibly reflecting a bone island. Soft tissues and spinal canal: No prevertebral fluid or swelling. No visible canal hematoma. Disc levels: Cervical spondylosis. Most notably at C5-C6 and C6-C7 there is moderate disc degeneration with posterior disc osteophytes and uncovertebral hypertrophy. The C5-C6 posterior disc osteophyte contributes to moderate/severe spinal canal stenosis. Moderate/severe spinal canal stenosis at these levels with possible spinal cord mass effect. Upper chest: No consolidation within the  imaged lung apices. No visible pneumothorax Other: Multiple thyroid nodules, the largest arising from the right lobe measuring 2.2 cm IMPRESSION: CT head: 1. No evidence of acute intracranial abnormality. 2. Stable, moderate generalized cerebral atrophy. 3. Stable, mild cerebral white matter chronic small vessel ischemic disease. 4. Left frontal sinusitis.  Mild ethmoid sinus mucosal thickening. CT cervical spine: 1. No evidence of acute fracture to the cervical spine. 2. Minimal C4-C5 grade 1 anterolisthesis. 3. Cervical spondylosis greatest at C5-C6 and C6-C7 where prominent posterior disc osteophyte complexes contribute to multifactorial moderate/severe spinal canal stenosis with possible spinal cord mass effect. 4. Multiple thyroid nodules, the largest within the right lobe measuring 2.2 cm. Nonemergent thyroid ultrasound is recommended for further evaluation. Electronically Signed   By: Jackey Loge DO   On: 07/06/2020 18:28   DG Chest Portable 1 View  Result Date: 07/06/2020 CLINICAL DATA:  Weakness EXAM: PORTABLE CHEST 1 VIEW COMPARISON:  None. FINDINGS: Single frontal view of the chest demonstrates an unremarkable cardiac silhouette. No airspace disease, effusion, or pneumothorax. No acute bony abnormality. IMPRESSION: 1. No acute intrathoracic process. Electronically Signed   By: Sharlet Salina M.D.   On: 07/06/2020 21:49    Assessment: 74 year old female with Parkinson's disease and recent falls. Neurology was consulted for possible titration of Sinemet dose.  1. Exam reveals hypomimia, poor attention/concentration and prominent BUE resting tremor, worse on the left, with cogwheel rigidity.   2. CT head reveals no acute abnormality. Stable, moderate generalized cerebral atrophy and chronic small vessel ischemic changes are noted.   Recommendations: 1. Increase scheduled Sinemet dosing to QID 2. PT/OT.   3. Outpatient Neurology follow up within 2 weeks of discharge.  4. Management of  traumatic rhabdomyolysis and AKI per primary team.     Electronically signed: Dr. Caryl Pina 07/07/2020, 12:27 AM

## 2020-07-07 NOTE — ED Notes (Signed)
Report called to carla on 6n

## 2020-07-07 NOTE — Progress Notes (Signed)
[facility]                                                                              Patient Demographics  Sherri Pratt, is a 74 y.o. adult, DOB - June 04, 1946, ZOX:[id number]  Admit date - 07/06/2020   Admitting Physician Sherri Bow, DO  Outpatient Primary Pratt for the patient is Sherri Pratt  Outpatient specialists:   LOS - 0  days   Medical records reviewed and are as summarized below:    Chief Complaint  Patient presents with  . Fall       Brief summary   Patient is a 74 year old female with history of paroxysmal A. fib on Eliquis, diabetes mellitus type 2, hypertension, gout, Parkinson's disease, presented after fall at home.  Patient reported 2 falls in the last week.  On the day of admission had a fall off of the toilet, was on the floor for a while.  Patient's husband has dementia and was unable to help the patient get up.  Patient was brought to ED by EMS.  No loss of consciousness with fall, did hit her head CT head negative, CPK 6270, AKI with creatinine 1.5.  UA negative for UTI  Assessment & Plan    Principal Problem:   Traumatic rhabdomyolysis (HCC) secondary to mechanical fall in the setting of Parkinson's disease -CK 6270 at the time of admission with AKI -Continue IV fluid hydration, CK trending down to 4716 today, creatinine improving to 1.3.   Active Problems:   AKI (acute kidney injury) (HCC) -Baseline creatinine 1.0, presented with creatinine of 1.5 secondary to #1 -Continue IV fluid hydration    PAF (paroxysmal atrial fibrillation) (HCC)  -On Eliquis, BP borderline, hold off on beta-blocker -Discussed in detail with the patient regarding anticoagulation given recent falls and high risk of bleeding.  Patient however wants to continue Eliquis at this time.    DM2 (diabetes mellitus, type 2) (HCC) -Hold glipizide, continue sliding scale insulin    HTN (hypertension) -BP borderline, continue IV fluids, hold  beta-blocker    Parkinson's disease (HCC) -Currently on Sinemet, frequent falls, tremors worsened in last week, neurology was consulted -Recommended increasing Sinemet to 1 tab p.o. 4 times daily, PT OT and outpatient neurology follow-up. -PT OT evaluation  Hyperlipidemia Given elevated CPK, LFTs, hold simvastatin  Transaminitis Elevated AST, ALT, likely due to #1, currently no abdominal pain -Follow LFTs, hold simvastatin, continue IVF  Obesity Estimated body mass index is 30.9 kg/m as calculated from the following:   Height as of this encounter: 5\' 4"  (1.626 m).   Weight as of this encounter: 81.6 kg.  Code Status: Full CODE STATUS DVT Prophylaxis: Apixaban Family Communication: Discussed all imaging results, lab results, explained to the patient and son, Sherri Pratt on the phone    Disposition Plan:     Status is: Observation  The patient will require care spanning > 2 midnights and should be moved to inpatient because: Inpatient level of care appropriate due to severity of illness  Dispo: The patient is from: Home  Anticipated d/c is to: TBD              Anticipated d/c date is: 2 days              Patient currently is not medically stable to d/c.     Time Spent in minutes   35 mins   Procedures:  None  Consultants:   Neurology  Antimicrobials:   Anti-infectives (From admission, onward)   None          Medications  Scheduled Meds: . allopurinol  300 mg Oral Daily  . apixaban  5 mg Oral BID  . carbidopa-levodopa  1 tablet Oral QID  . [START ON 07/08/2020] influenza vaccine adjuvanted  0.5 mL Intramuscular Tomorrow-1000  . insulin aspart  0-15 Units Subcutaneous TID WC  . insulin aspart  0-5 Units Subcutaneous QHS  . simvastatin  20 mg Oral QHS   Continuous Infusions: . sodium chloride 150 mL/hr at 07/07/20 0409   PRN Meds:.acetaminophen **OR** acetaminophen, ondansetron **OR** ondansetron (ZOFRAN) IV, pneumococcal 23 valent  vaccine      Subjective:   Sherri Pratt was seen and examined today.  No new complaints, no pain.  Still has generalized weakness with worsening pill-rolling tremor on the left.  Has not gotten out of the bed yet.  Patient denies chest pain, shortness of breath, abdominal pain, N/V/D/C.  Afebrile  Objective:   Vitals:   07/07/20 0300 07/07/20 0345 07/07/20 0404 07/07/20 0432  BP: (!) 89/35 (!) 94/36 (!) 94/36 123/63  Pulse: 63 63 66 71  Resp: Temp:   98.4 F (36.9 C) 98.7 F (37.1 C)  TempSrc:   Oral   SpO2: 97% 95% 98% 100%  Weight:      Height:        Intake/Output Summary (Last 24 hours) at 07/07/2020 0936 Last data filed at 07/07/2020 0500 Gross per 24 hour  Intake 2127.5 ml  Output 0 ml  Net 2127.5 ml     Wt Readings from Last 3 Encounters:  07/06/20 81.6 kg     Exam  General: Alert and oriented x 3, NAD  Cardiovascular: S1 S2 auscultated, no murmurs, RRR  Respiratory: Clear to auscultation bilaterally, no wheezing, rales or rhonchi  Gastrointestinal: Soft, nontender, nondistended, + bowel sounds  Ext: no pedal edema bilaterally  Neuro: no new deficits, resting tremor bilateral hands, worse on left, strength 5/5 upper extremities.  Musculoskeletal: No digital cyanosis, clubbing  Skin: No rashes  Psych: flat affect   Data Reviewed:  I have personally reviewed following labs and imaging studies  Micro Results Recent Results (from the past 240 hour(s))  Respiratory Panel by RT PCR (Flu A&B, Covid) - Nasopharyngeal Swab     Status: None   Collection Time: 07/06/20  9:25 PM   Specimen: Nasopharyngeal Swab  Result Value Ref Range Status   SARS Coronavirus 2 by RT PCR NEGATIVE NEGATIVE Final    Comment: QA FLAGS AND/OR RANGES MODIFIED BY DEMOGRAPHIC UPDATE ON 09/24 AT 2338 QA FLAGS AND/OR RANGES MODIFIED BY DEMOGRAPHIC UPDATE ON 09/24 AT 2341 QA FLAGS AND/OR RANGES MODIFIED BY DEMOGRAPHIC UPDATE ON 09/25 AT 0831 QA FLAGS AND/OR RANGES  MODIFIED BY DEMOGRAPHIC UPDATE ON 09/25 AT 0902 (NOTE) SARS-CoV-2 target nucleic acids are NOT DETECTED.  The SARS-CoV-2 RNA is generally detectable in upper respiratoy specimens during the acute phase of infection. The lowest concentration of SARS-CoV-2 viral copies this assay can detect is 131 copies/mL. A negative result does  not preclude SARS-Cov-2 infection and should not be used as the sole basis for treatment or other patient management decisions. A negative result may occur with  improper specimen collection/handling, submission of specimen other than nasopharyngeal swab, presence of viral mutation(s) within the areas targeted by this assay, and inadequate number of viral copies (<131 copies/mL). A negative result must be combined  with clinical observations, patient history, and epidemiological information. The expected result is Negative.  Fact Sheet for Patients:  https://www.moore.com/https://www.fda.gov/media/142436/download  Fact Sheet for Healthcare Providers:  https://www.young.biz/https://www.fda.gov/media/142435/download  This test is not yet approved or cleared by the [place] FDA and  has been authorized for detection and/or diagnosis of SARS-CoV-2 by FDA under an Emergency Use Authorization (EUA). This EUA will remain  in effect (meaning this test can be used) for the duration of the COVID-19 declaration under Section 564(b)(1) of the Act, 21 U.S.C. section 360bbb-3(b)(1), unless the authorization is terminated or revoked sooner.     Influenza A by PCR NEGATIVE NEGATIVE Final    Comment: QA FLAGS AND/OR RANGES MODIFIED BY DEMOGRAPHIC UPDATE ON 09/24 AT 2338 QA FLAGS AND/OR RANGES MODIFIED BY DEMOGRAPHIC UPDATE ON 09/24 AT 2341 QA FLAGS AND/OR RANGES MODIFIED BY DEMOGRAPHIC UPDATE ON 09/25 AT 0831 QA FLAGS AND/OR RANGES MODIFIED BY DEMOGRAPHIC UPDATE ON 09/25 AT 0902    Influenza B by PCR NEGATIVE NEGATIVE Final    Comment: QA FLAGS AND/OR RANGES MODIFIED BY DEMOGRAPHIC UPDATE ON 09/24 AT  2338 QA FLAGS AND/OR RANGES MODIFIED BY DEMOGRAPHIC UPDATE ON 09/24 AT 2341 QA FLAGS AND/OR RANGES MODIFIED BY DEMOGRAPHIC UPDATE ON 09/25 AT 0831 QA FLAGS AND/OR RANGES MODIFIED BY DEMOGRAPHIC UPDATE ON 09/25 AT 0902 (NOTE) The Xpert Xpress SARS-CoV-2/FLU/RSV assay is intended as an aid in  the diagnosis of influenza from Nasopharyngeal swab specimens and  should not be used as a sole basis for treatment. Nasal washings and  aspirates are unacceptable for Xpert Xpress SARS-CoV-2/FLU/RSV  testing.  Fact Sheet for Patients: https://www.moore.com/https://www.fda.gov/media/142436/download  Fact Sheet for Healthcare Providers: https://www.young.biz/https://www.fda.gov/media/142435/download  This test is not yet approved or cleared by the [place] FDA and  has been authorized for detection and/or diagnosis of SARS-CoV-2 by  FDA under an Emergency Use Authorization (EUA). This EUA will remain  in effect (meaning this test can be used) for the du ration of the  Covid-19 declaration under Section 564(b)(1) of the Act, 21  U.S.C. section 360bbb-3(b)(1), unless the authorization is  terminated or revoked. Performed at [facility], [address] [place], [place] [address]     Radiology Reports CT HEAD WO CONTRAST  Result Date: 07/06/2020 CLINICAL DATA:  Head trauma, minor. EXAM: CT HEAD WITHOUT CONTRAST CT CERVICAL SPINE WITHOUT CONTRAST TECHNIQUE: Multidetector CT imaging of the head and cervical spine was performed following the standard protocol without intravenous contrast. Multiplanar CT image reconstructions of the cervical spine were also generated. COMPARISON:  CT head/cervical spine 07/18/2018. FINDINGS: CT HEAD FINDINGS Brain: Stable, moderate generalized cerebral atrophy. Stable, mild ill-defined hypoattenuation within the cerebral white matter which is nonspecific, but likely reflects sequela of chronic small vessel ischemic disease. There is no acute intracranial hemorrhage. No demarcated cortical infarct. No  extra-axial fluid collection. No evidence of intracranial mass. No midline shift. Vascular: No hyperdense vessel.  Atherosclerotic calcifications. Skull: Normal. Negative for fracture or focal lesion. Sinuses/Orbits: Visualized orbits show no acute finding. Mild ethmoid sinus mucosal thickening. Frothy secretions within the left frontal sinus. No significant mastoid effusion. CT CERVICAL SPINE FINDINGS Alignment: A  mild cervical levocurvature may be positional. Trace C4-C5 grade 1 anterolisthesis Skull base and vertebrae: The basion-dental and atlanto-dental intervals are maintained.No evidence of acute fracture to the cervical spine. 4 mm sclerotic focus within the left C4 articular pillar, nonspecific but possibly reflecting a bone island. Soft tissues and spinal canal: No prevertebral fluid or swelling. No visible canal hematoma. Disc levels: Cervical spondylosis. Most notably at C5-C6 and C6-C7 there is moderate disc degeneration with posterior disc osteophytes and uncovertebral hypertrophy. The C5-C6 posterior disc osteophyte contributes to moderate/severe spinal canal stenosis. Moderate/severe spinal canal stenosis at these levels with possible spinal cord mass effect. Upper chest: No consolidation within the imaged lung apices. No visible pneumothorax Other: Multiple thyroid nodules, the largest arising from the right lobe measuring 2.2 cm IMPRESSION: CT head: 1. No evidence of acute intracranial abnormality. 2. Stable, moderate generalized cerebral atrophy. 3. Stable, mild cerebral white matter chronic small vessel ischemic disease. 4. Left frontal sinusitis.  Mild ethmoid sinus mucosal thickening. CT cervical spine: 1. No evidence of acute fracture to the cervical spine. 2. Minimal C4-C5 grade 1 anterolisthesis. 3. Cervical spondylosis greatest at C5-C6 and C6-C7 where prominent posterior disc osteophyte complexes contribute to multifactorial moderate/severe spinal canal stenosis with possible spinal cord  mass effect. 4. Multiple thyroid nodules, the largest within the right lobe measuring 2.2 cm. Nonemergent thyroid ultrasound is recommended for further evaluation. Electronically Signed   By: Jackey Loge DO   On: 07/06/2020 18:28   CT Cervical Spine Wo Contrast  Result Date: 07/06/2020 CLINICAL DATA:  Head trauma, minor. EXAM: CT HEAD WITHOUT CONTRAST CT CERVICAL SPINE WITHOUT CONTRAST TECHNIQUE: Multidetector CT imaging of the head and cervical spine was performed following the standard protocol without intravenous contrast. Multiplanar CT image reconstructions of the cervical spine were also generated. COMPARISON:  CT head/cervical spine 07/18/2018. FINDINGS: CT HEAD FINDINGS Brain: Stable, moderate generalized cerebral atrophy. Stable, mild ill-defined hypoattenuation within the cerebral white matter which is nonspecific, but likely reflects sequela of chronic small vessel ischemic disease. There is no acute intracranial hemorrhage. No demarcated cortical infarct. No extra-axial fluid collection. No evidence of intracranial mass. No midline shift. Vascular: No hyperdense vessel.  Atherosclerotic calcifications. Skull: Normal. Negative for fracture or focal lesion. Sinuses/Orbits: Visualized orbits show no acute finding. Mild ethmoid sinus mucosal thickening. Frothy secretions within the left frontal sinus. No significant mastoid effusion. CT CERVICAL SPINE FINDINGS Alignment: A mild cervical levocurvature may be positional. Trace C4-C5 grade 1 anterolisthesis Skull base and vertebrae: The basion-dental and atlanto-dental intervals are maintained.No evidence of acute fracture to the cervical spine. 4 mm sclerotic focus within the left C4 articular pillar, nonspecific but possibly reflecting a bone island. Soft tissues and spinal canal: No prevertebral fluid or swelling. No visible canal hematoma. Disc levels: Cervical spondylosis. Most notably at C5-C6 and C6-C7 there is moderate disc degeneration with  posterior disc osteophytes and uncovertebral hypertrophy. The C5-C6 posterior disc osteophyte contributes to moderate/severe spinal canal stenosis. Moderate/severe spinal canal stenosis at these levels with possible spinal cord mass effect. Upper chest: No consolidation within the imaged lung apices. No visible pneumothorax Other: Multiple thyroid nodules, the largest arising from the right lobe measuring 2.2 cm IMPRESSION: CT head: 1. No evidence of acute intracranial abnormality. 2. Stable, moderate generalized cerebral atrophy. 3. Stable, mild cerebral white matter chronic small vessel ischemic disease. 4. Left frontal sinusitis.  Mild ethmoid sinus mucosal thickening. CT cervical spine: 1. No evidence of acute fracture to the cervical spine. 2. Minimal C4-C5  grade 1 anterolisthesis. 3. Cervical spondylosis greatest at C5-C6 and C6-C7 where prominent posterior disc osteophyte complexes contribute to multifactorial moderate/severe spinal canal stenosis with possible spinal cord mass effect. 4. Multiple thyroid nodules, the largest within the right lobe measuring 2.2 cm. Nonemergent thyroid ultrasound is recommended for further evaluation. Electronically Signed   By: Jackey Loge DO   On: 07/06/2020 18:28   DG Chest Portable 1 View  Result Date: 07/06/2020 CLINICAL DATA:  Weakness EXAM: PORTABLE CHEST 1 VIEW COMPARISON:  None. FINDINGS: Single frontal view of the chest demonstrates an unremarkable cardiac silhouette. No airspace disease, effusion, or pneumothorax. No acute bony abnormality. IMPRESSION: 1. No acute intrathoracic process. Electronically Signed   By: Sharlet Salina M.D.   On: 07/06/2020 21:49    Lab Data:  CBC: Recent Labs  Lab 07/06/20 2003  WBC 14.5*  NEUTROABS 12.4*  HGB 12.4*  HCT 38.8*  MCV 86.4  PLT 208   Basic Metabolic Panel: Recent Labs  Lab 07/06/20 2003 07/07/20 0348  NA 140 143  K 4.2 3.8  CL 105 114*  CO2 20* 22  GLUCOSE 258* 138*  BUN 48* 46*  CREATININE  1.56* 1.33*  CALCIUM 9.5 8.3*   GFR: Estimated Creatinine Clearance (by C-G formula based on SCr of 1.33 mg/dL (H)) Female: 76.5 mL/min (A) Female: 46.3 mL/min (A) Liver Function Tests: Recent Labs  Lab 07/06/20 2003  AST 136*  ALT 51*  ALKPHOS 65  BILITOT 0.9  PROT 6.8  ALBUMIN 3.6   No results for input(s): LIPASE, AMYLASE in the last 168 hours. No results for input(s): AMMONIA in the last 168 hours. Coagulation Profile: No results for input(s): INR, PROTIME in the last 168 hours. Cardiac Enzymes: Recent Labs  Lab 07/06/20 2003 07/07/20 0348  CKTOTAL 6,270* 4,716*   BNP (last 3 results) No results for input(s): PROBNP in the last 8760 hours. HbA1C: Recent Labs    07/06/20 2003  HGBA1C 6.2*   CBG: Recent Labs  Lab 07/07/20 0141 07/07/20 0751  GLUCAP 154*  154* 103*   Lipid Profile: No results for input(s): CHOL, HDL, LDLCALC, TRIG, CHOLHDL, LDLDIRECT in the last 72 hours. Thyroid Function Tests: No results for input(s): TSH, T4TOTAL, FREET4, T3FREE, THYROIDAB in the last 72 hours. Anemia Panel: No results for input(s): VITAMINB12, FOLATE, FERRITIN, TIBC, IRON, RETICCTPCT in the last 72 hours. Urine analysis:    Component Value Date/Time   COLORURINE YELLOW 07/07/2020 0902   APPEARANCEUR HAZY (A) 07/07/2020 0902   LABSPEC 1.021 07/07/2020 0902   PHURINE 5.0 07/07/2020 0902   GLUCOSEU 50 (A) 07/07/2020 0902   HGBUR LARGE (A) 07/07/2020 0902   BILIRUBINUR NEGATIVE 07/07/2020 0902   KETONESUR NEGATIVE 07/07/2020 0902   PROTEINUR NEGATIVE 07/07/2020 0902   NITRITE NEGATIVE 07/07/2020 0902   LEUKOCYTESUR NEGATIVE 07/07/2020 0902     Monterio Bob M.D. [facility] 07/07/2020, 9:36 AM   Call night coverage person covering after 7pm

## 2020-07-07 NOTE — Evaluation (Addendum)
Physical Therapy Evaluation Patient Details Name: Sherri Pratt MRN: [id number] DOB: Mar 11, 1946 Today's Date: 07/07/2020   History of Present Illness  Pateint suffered a fall on the commode. She was not found for several hours. She was diagnosed with traumatic Rhabdo PMH: Parkinsons, Af-fib, anem anemia  A-fib, DMII, gout   Clinical Impression  Patient required significant assistance for all mobility. She was unable to safely stand and transfer to a chair despite several attempts, assist, and cuing. She consistently fell to the left in sitting. She was unable to maintain a sitting position without assist. She has 4 steps to get into her house. She is the primary caregiver for her husband. At this time she may require rehab at a SNF. Acute therapy will continue to follow her.      Follow Up Recommendations SNF    Equipment Recommendations    3-1 commode if not discharged to a SNF   Recommendations for Other Services Rehab consult     Precautions / Restrictions Precautions Precautions: Fall Precaution Comments: has had several falls per chart  Restrictions Weight Bearing Restrictions: No      Mobility  Bed Mobility Overal bed mobility: Needs Assistance Bed Mobility: Supine to Sit     Supine to sit: Max assist     General bed mobility comments: Max a to scoot hips to the edge of the bed. Once sitting min/mod a to maintain sitting at the edge of the bed. Patient cosnitently fell to the left.   Transfers Overall transfer level: Needs assistance Equipment used: Rolling walker (2 wheeled) Transfers: Sit to/from Stand Sit to Stand: Total assist         General transfer comment: depsite 3 trials and cuing for foot placement and weight shift therapy was unable to get patients bottom off the matress.   Ambulation/Gait                Stairs            Wheelchair Mobility    Modified Rankin (Stroke Patients Only)       Balance Overall balance assessment:  Needs assistance Sitting-balance support: Bilateral upper extremity supported;Feet supported Sitting balance-Leahy Scale: Poor Sitting balance - Comments: therapy had to give min/mod a to keep patient from falling to the left  Postural control: Left lateral lean Standing balance support: Bilateral upper extremity supported Standing balance-Leahy Scale: Zero Standing balance comment: unable to come to standing position                              Pertinent Vitals/Pain Pain Assessment: Faces ("I ache all over ") Faces Pain Scale: Hurts little more Pain Location: No specific location given. Per patient general soreness     Home Living Family/patient expects to be discharged to:: Private residence Living Arrangements: Spouse/significant other Available Help at Discharge: Family Type of Home: House Home Access: Stairs to enter Entrance Stairs-Rails: None Entrance Stairs-Number of Steps: 4 Home Layout: One level   Additional Comments: Patients son lives nearby. Patients husband has dementia. She is the primary caregiver. Does not have a raised toiet seat or any handles     Prior Function Level of Independence: Independent with assistive device(s)         Comments: used a walker sometimes but had frequent falls      Hand Dominance   Dominant Hand: Right    Extremity/Trunk Assessment   Upper Extremity Assessment Upper Extremity Assessment:  Defer to OT evaluation    Lower Extremity Assessment Lower Extremity Assessment: Generalized weakness (difficulty formaly assessing 2nd to difficulty sitting )       Communication   Communication: No difficulties  Cognition Arousal/Alertness: Awake/alert Behavior During Therapy: Flat affect Overall Cognitive Status: Within Functional Limits for tasks assessed                                        General Comments      Exercises     Assessment/Plan    PT Assessment Patient needs continued PT  services  PT Problem List Decreased strength;Decreased activity tolerance;Decreased balance;Decreased mobility;Decreased knowledge of use of DME       PT Treatment Interventions DME instruction;Gait training;Functional mobility training;Therapeutic activities;Therapeutic exercise    PT Goals (Current goals can be found in the Care Plan section)  Acute Rehab PT Goals Patient Stated Goal: to get stronger  PT Goal Formulation: With patient Time For Goal Achievement: 07/14/20 Potential to Achieve Goals: Good    Frequency Min 2X/week   Barriers to discharge Decreased caregiver support Patient is a primary caregiver for her husband     Co-evaluation               AM-PAC PT "6 Clicks" Mobility  Outcome Measure Help needed turning from your back to your side while in a flat bed without using bedrails?: A Lot Help needed moving from lying on your back to sitting on the side of a flat bed without using bedrails?: A Lot Help needed moving to and from a bed to a chair (including a wheelchair)?: Total Help needed standing up from a chair using your arms (e.g., wheelchair or bedside chair)?: Total Help needed to walk in hospital room?: Total Help needed climbing 3-5 steps with a railing? : Total 6 Click Score: 8    End of Session Equipment Utilized During Treatment: Gait belt Activity Tolerance: Patient limited by fatigue;Patient limited by lethargy Patient left: in bed;with call bell/phone within reach Nurse Communication: Mobility status PT Visit Diagnosis: Unsteadiness on feet (R26.81);Other abnormalities of gait and mobility (R26.89);Repeated falls (R29.6);Muscle weakness (generalized) (M62.81)    Time: 1022-1050 PT Time Calculation (min) (ACUTE ONLY): 28 min   Charges:   PT Evaluation $PT Eval Moderate Complexity: 1 Mod          Dessie Coma  PT DPT   07/07/2020, 11:32 AM

## 2020-07-07 NOTE — ED Notes (Signed)
We lost the room assigned

## 2020-07-08 MED ORDER — SODIUM CHLORIDE 0.9 % IV SOLN: INTRAVENOUS | Status: AC

## 2020-07-08 NOTE — [place] FL2 (Signed)
[place] MEDICAID FL2 LEVEL OF CARE SCREENING TOOL     IDENTIFICATION  Patient Name: ARACELI ARANGO Birthdate: May 10, 1946 Sex: adult Admission Date (Current Location): 07/06/2020  [facility] and [place] Number:  Producer, television/film/video and Address:  [facility], [address], [place], [place] [address]      Provider Number: [id number]  Attending Physician Name and Address:  Cathren Harsh, MD  Relative Name and Phone Number:  ZNIYA, COTTONE   [phone]    Current Level of Care: Hospital Recommended Level of Care: Skilled Nursing Facility Prior Approval Number:    Date Approved/Denied:   PASRR Number: [id number]  Discharge Plan: SNF    Current Diagnoses: Patient Active Problem List   Diagnosis Date Noted  . AKI (acute kidney injury) (HCC) 07/06/2020  . Traumatic rhabdomyolysis (HCC) 07/06/2020  . PAF (paroxysmal atrial fibrillation) (HCC) 07/06/2020  . DM2 (diabetes mellitus, type 2) (HCC) 07/06/2020  . HTN (hypertension) 07/06/2020  . Parkinson's disease (HCC) 07/06/2020    Orientation RESPIRATION BLADDER Height & Weight     Self, Situation  Normal Incontinent Weight: 180 lb (81.6 kg) Height:  5\' 4"  (162.6 cm)  BEHAVIORAL SYMPTOMS/MOOD NEUROLOGICAL BOWEL NUTRITION STATUS      Continent Diet (carb modified, fluid consistency.  See DC summary)  AMBULATORY STATUS COMMUNICATION OF NEEDS Skin   Total Care Verbally Other (Comment) (MASD)                       Personal Care Assistance Level of Assistance  Bathing, Feeding, Dressing Bathing Assistance: Maximum assistance Feeding assistance: Limited assistance Dressing Assistance: Maximum assistance     Functional Limitations Info  Sight, Hearing, Speech Sight Info: Adequate Hearing Info: Adequate Speech Info: Adequate    SPECIAL CARE FACTORS FREQUENCY  PT (By licensed PT), OT (By licensed OT)     PT Frequency: 5x week OT Frequency: 5x week            Contractures  Contractures Info: Not present    Additional Factors Info  Code Status, Allergies Code Status Info: full Allergies Info: NKA           Current Medications (07/08/2020):  This is the current hospital active medication list Current Facility-Administered Medications  Medication Dose Route Frequency Provider Last Rate Last Admin  . 0.9 %  sodium chloride infusion   Intravenous Continuous Rai, Ripudeep K, MD 100 mL/hr at 07/08/20 1110 New Bag at 07/08/20 1110  . acetaminophen (TYLENOL) tablet 650 mg  650 mg Oral Q6H PRN 07/10/20, DO       Or  . acetaminophen (TYLENOL) suppository 650 mg  650 mg Rectal Q6H PRN Hillary Bow, DO      . allopurinol (ZYLOPRIM) tablet 300 mg  300 mg Oral Daily Hillary Bow M, DO   300 mg at 07/08/20 1033  . apixaban (ELIQUIS) tablet 5 mg  5 mg Oral BID 07/10/20 M, DO   5 mg at 07/08/20 1033  . carbidopa-levodopa (SINEMET IR) 25-100 MG per tablet immediate release 1 tablet  1 tablet Oral QID 07/10/20, MD   1 tablet at 07/08/20 1033  . influenza vaccine adjuvanted (FLUAD) injection 0.5 mL  0.5 mL Intramuscular Tomorrow-1000 07/10/20, Jared M, DO      . insulin aspart (novoLOG) injection 0-15 Units  0-15 Units Subcutaneous TID WC 12-12-1984, DO   3 Units at 07/07/20 1224  . insulin aspart (novoLOG) injection  0-5 Units  0-5 Units Subcutaneous QHS Lyda Perone M, DO      . ondansetron [facility]) tablet 4 mg  4 mg Oral Q6H PRN Hillary Bow, DO       Or  . ondansetron [facility]) injection 4 mg  4 mg Intravenous Q6H PRN Hillary Bow, DO      . pneumococcal 23 valent vaccine (PNEUMOVAX-23) injection 0.5 mL  0.5 mL Intramuscular Prior to discharge Hillary Bow, DO         Discharge Medications: Please see discharge summary for a list of discharge medications.  Relevant Imaging Results:  Relevant Lab Results:   Additional Information SSN [id number]  Lorri Frederick, LCSW

## 2020-07-08 NOTE — TOC Initial Note (Signed)
Transition of Care [facility]) - Initial/Assessment Note    Patient Details  Name: Sherri Pratt MRN: [id number] Date of Birth: 21-Mar-1946  [facility] CM/SW Contact:    Sherri Frederick, Sherri Pratt Phone Number: 07/08/2020, 1:11 PM  Clinical Narrative:  CSW discussed with pt recommendation for SNF and pt is agreeable to this.  Pt reports she has been to [facility] 3x and it would be her first choice to return there.  Permission given to send out information to SNF, permission also given to speak to her three sons and her husband.  Pt is vaccinated for covid.                   Expected Discharge Plan: Skilled Nursing Facility Barriers to Discharge: Continued Medical Work up, SNF Pending bed offer   Patient Goals and CMS Choice Patient states their goals for this hospitalization and ongoing recovery are:: get up, be able to walk CMS Medicare.gov Compare Post Acute Care list provided to:: Patient Choice offered to / list presented to : Patient  Expected Discharge Plan and Services Expected Discharge Plan: Skilled Nursing Facility     Post Acute Care Choice: Skilled Nursing Facility Living arrangements for the past 2 months: Single Family Home                                      Prior Living Arrangements/Services Living arrangements for the past 2 months: Single Family Home Lives with:: Spouse Patient language and need for interpreter reviewed:: Yes Do you feel safe going back to the place where you live?: Yes      Need for Family Participation in Patient Care: Yes (Comment) Care giver support system in place?: Yes (comment)   Criminal Activity/Legal Involvement Pertinent to Current Situation/Hospitalization: No - Comment as needed  Activities of Daily Living Home Assistive Devices/Equipment: Walker (specify type), Eyeglasses ADL Screening (condition at time of admission) Patient's cognitive ability adequate to safely complete daily activities?: Yes Is the  patient deaf or have difficulty hearing?: No Does the patient have difficulty seeing, even when wearing glasses/contacts?: No (uses eyeglasses) Does the patient have difficulty concentrating, remembering, or making decisions?: No Patient able to express need for assistance with ADLs?: Yes Does the patient have difficulty walking or climbing stairs?: Yes Weakness of Legs: Both Weakness of Arms/Hands: None  Permission Sought/Granted Permission sought to share information with : Facility Medical sales representative, Family Supports Permission granted to share information with : Yes, Verbal Permission Granted  Share Information with NAME: three sons: Sherri Pratt, Sherri Pratt, also husband Sherri Pratt  Permission granted to share info w AGENCY: SNF        Emotional Assessment Appearance:: Appears stated age Attitude/Demeanor/Rapport: Engaged Affect (typically observed): Pleasant Orientation: : Oriented to Self, Oriented to Situation Alcohol / Substance Use: Not Applicable Psych Involvement: No (comment)  Admission diagnosis:  AKI (acute kidney injury) (HCC) [N17.9] Traumatic rhabdomyolysis, initial encounter (HCC) [T79.6XXA] Patient Active Problem List   Diagnosis Date Noted  . AKI (acute kidney injury) (HCC) 07/06/2020  . Traumatic rhabdomyolysis (HCC) 07/06/2020  . PAF (paroxysmal atrial fibrillation) (HCC) 07/06/2020  . DM2 (diabetes mellitus, type 2) (HCC) 07/06/2020  . HTN (hypertension) 07/06/2020  . Parkinson's disease (HCC) 07/06/2020   PCP:  Earline Mayotte, MD Pharmacy:   [facility], [place] - [address] [place] [address] Phone: [phone] Fax: [phone]  Social Determinants of Health (SDOH) Interventions    Readmission Risk Interventions No flowsheet data found.

## 2020-07-08 NOTE — Progress Notes (Signed)
[facility]                                                                              Patient Demographics  Sherri Pratt, is a 74 y.o. adult, DOB - [birth year], WGN:[id number]  Admit date - 07/06/2020   Admitting Physician Hillary Bow, DO  Outpatient Primary MD for the patient is [facility], [facility], MD  Outpatient specialists:   LOS - 1  days   Medical records reviewed and are as summarized below:    Chief Complaint  Patient presents with  . Fall       Brief summary   Patient is a 74 year old female with history of paroxysmal A. fib on Eliquis, diabetes mellitus type 2, hypertension, gout, Parkinson's disease, presented after fall at home.  Patient reported 2 falls in the last week.  On the day of admission had a fall off of the toilet, was on the floor for a while.  Patient's husband has dementia and was unable to help the patient get up.  Patient was brought to ED by EMS.  No loss of consciousness with fall, did hit her head CT head negative, CPK 6270, AKI with creatinine 1.5.  UA negative for UTI  Assessment & Plan    Principal Problem:   Traumatic rhabdomyolysis (HCC) secondary to mechanical fall in the setting of Parkinson's disease -CK 6270 at the time of admission with AKI -Creatinine improving, 0.95, CK improving 2715 -Decrease IV fluids   Active Problems:   AKI (acute kidney injury) (HCC) -Baseline creatinine 1.0, presented with creatinine of 1.5 secondary to #1 -Resolved, creatinine 0.95    PAF (paroxysmal atrial fibrillation) (HCC)  -On Eliquis, BP borderline, hold off on beta-blocker -Discussed in detail with the patient regarding anticoagulation given recent falls and high risk of bleeding.  Patient wants to continue Eliquis at this time.    DM2 (diabetes mellitus, type 2) (HCC) -CBG stable, hold glipizide continue SSI     HTN (hypertension) -BP improving    Parkinson's disease (HCC) -Currently on Sinemet, frequent falls,  tremors worsened in last week, neurology was consulted -Recommended increasing Sinemet to 1 tab p.o. 4 times daily, PT OT and outpatient neurology follow-up. -PT evaluation recommending SNF  Hyperlipidemia Given elevated CPK, LFTs, hold simvastatin  Transaminitis Elevated AST, ALT, likely due to #1, currently no abdominal pain -Improving from admission, continue to hold simvastatin  Obesity Estimated body mass index is 30.9 kg/m as calculated from the following:   Height as of this encounter:  (1.626 m).   Weight as of this encounter: 81.6 kg.  Code Status: Full CODE STATUS DVT Prophylaxis: Apixaban Family Communication: Discussed all imaging results, lab results, explained to the patient and son, Mr Tierre Gerard on the phone on 9/25   Disposition Plan:     Status is: Observation  The patient will require care spanning > 2 midnights and should be moved to inpatient because: Inpatient level of care appropriate due to severity of illness  Dispo: The patient is from: Home              Anticipated d/c is to: TBD  Anticipated d/c date is: 2 days              Patient currently is not medically stable to d/c.     Time Spent in minutes   35 mins   Procedures:  None  Consultants:   Neurology  Antimicrobials:   Anti-infectives (From admission, onward)   None         Medications  Scheduled Meds: . allopurinol  300 mg Oral Daily  . apixaban  5 mg Oral BID  . carbidopa-levodopa  1 tablet Oral QID  . influenza vaccine adjuvanted  0.5 mL Intramuscular Tomorrow-1000  . insulin aspart  0-15 Units Subcutaneous TID WC  . insulin aspart  0-5 Units Subcutaneous QHS   Continuous Infusions: . sodium chloride 100 mL/hr at 07/08/20 1110   PRN Meds:.acetaminophen **OR** acetaminophen, ondansetron **OR** ondansetron (ZOFRAN) IV, pneumococcal 23 valent vaccine      Subjective:   Mikle Bosworth was seen and examined today.  No acute complaints, worsening  tremor on the left.  PT recommended SNF denies any specific pains, no chest pain, shortness of breath, fevers or chills.   Objective:   Vitals:   07/07/20 1030 07/07/20 1448 07/07/20 2124 07/08/20 0550  BP: (!) 105/46 (!) 126/54 130/89 (!) 139/56  Pulse: 68 77 72 66  Resp: Temp: 98.2 F (36.8 C) 98 F (36.7 C) 98 F (36.7 C) 98.5 F (36.9 C)  TempSrc:  Oral Oral Oral  SpO2: 96% 99% 100% 98%  Weight:      Height:        Intake/Output Summary (Last 24 hours) at 07/08/2020 1218 Last data filed at 07/08/2020 1047 Gross per 24 hour  Intake 3523.06 ml  Output 3100 ml  Net 423.06 ml     Wt Readings from Last 3 Encounters:  07/06/20 81.6 kg    Physical Exam  General: Alert and oriented x 3, NAD  Cardiovascular: S1 S2 clear, RRR. No pedal edema b/l  Respiratory: CTAB, no wheezing, rales or rhonchi  Gastrointestinal: Soft, nontender, nondistended, NBS  Ext: no pedal edema bilaterally  Neuro: no new deficits, resting tremor bilateral hands, worse on left  Musculoskeletal: No cyanosis, clubbing  Skin: No rashes  Psych: Normal affect and demeanor, alert and oriented x3    Data Reviewed:  I have personally reviewed following labs and imaging studies  Micro Results Recent Results (from the past 240 hour(s))  Respiratory Panel by RT PCR (Flu A&B, Covid) - Nasopharyngeal Swab     Status: None   Collection Time: 07/06/20  9:25 PM   Specimen: Nasopharyngeal Swab  Result Value Ref Range Status   SARS Coronavirus 2 by RT PCR NEGATIVE NEGATIVE Final    Comment: QA FLAGS AND/OR RANGES MODIFIED BY DEMOGRAPHIC UPDATE ON 09/24 AT 2338 QA FLAGS AND/OR RANGES MODIFIED BY DEMOGRAPHIC UPDATE ON 09/24 AT 2341 QA FLAGS AND/OR RANGES MODIFIED BY DEMOGRAPHIC UPDATE ON 09/25 AT 0831 QA FLAGS AND/OR RANGES MODIFIED BY DEMOGRAPHIC UPDATE ON 09/25 AT 0902 QA FLAGS AND/OR RANGES MODIFIED BY DEMOGRAPHIC UPDATE ON 09/25 AT 0943 QA FLAGS AND/OR RANGES MODIFIED BY DEMOGRAPHIC  UPDATE ON 09/25 AT 1323 QA FLAGS AND/OR RANGES MODIFIED BY DEMOGRAPHIC UPDATE ON 09/25 AT 1703 QA FLAGS AND/OR RANGES MODIFIED BY DEMOGRAPHIC UPDATE ON 09/25 AT 1847 (NOTE) SARS-CoV-2 target nucleic acids are NOT DETECTED.  The SARS-CoV-2 RNA is generally detectable in upper respiratoy specimens during the acute phase of infection. The lowest concentration of SARS-CoV-2 viral copies this  assay can detect is 131 copies/mL. A negative result does not preclude SARS-Cov-2 infection and should not be used as the sole basis for treatment or other patient management decisions. A neg ative result may occur with  improper specimen collection/handling, submission of specimen other than nasopharyngeal swab, presence of viral mutation(s) within the areas targeted by this assay, and inadequate number of viral copies (<131 copies/mL). A negative result must be combined with clinical observations, patient history, and epidemiological information. The expected result is Negative.  Fact Sheet for Patients:  https://www.moore.com/https://www.fda.gov/media/142436/download  Fact Sheet for Healthcare Providers:  https://www.young.biz/https://www.fda.gov/media/142435/download  This test is not yet approved or cleared by the [place] FDA and  has been authorized for detection and/or diagnosis of SARS-CoV-2 by FDA under an Emergency Use Authorization (EUA). This EUA will remain  in effect (meaning this test can be used) for the duration of the COVID-19 declaration under Section 564(b)(1) of the Act, 21 U.S.C. section 360bbb-3(b)(1), unless the authorization is terminated or revoked sooner.     Influenza A by PCR NEGATIVE NEGATIVE Final    Comment: QA FLAGS AND/OR RANGES MODIFIED BY DEMOGRAPHIC UPDATE ON 09/24 AT 2338 QA FLAGS AND/OR RANGES MODIFIED BY DEMOGRAPHIC UPDATE ON 09/24 AT 2341 QA FLAGS AND/OR RANGES MODIFIED BY DEMOGRAPHIC UPDATE ON 09/25 AT 0831 QA FLAGS AND/OR RANGES MODIFIED BY DEMOGRAPHIC UPDATE ON 09/25 AT 0902 QA FLAGS  AND/OR RANGES MODIFIED BY DEMOGRAPHIC UPDATE ON 09/25 AT 0943 QA FLAGS AND/OR RANGES MODIFIED BY DEMOGRAPHIC UPDATE ON 09/25 AT 1323 QA FLAGS AND/OR RANGES MODIFIED BY DEMOGRAPHIC UPDATE ON 09/25 AT 1703 QA FLAGS AND/OR RANGES MODIFIED BY DEMOGRAPHIC UPDATE ON 09/25 AT 1847    Influenza B by PCR NEGATIVE NEGATIVE Final    Comment: QA FLAGS AND/OR RANGES MODIFIED BY DEMOGRAPHIC UPDATE ON 09/24 AT 2338 QA FLAGS AND/OR RANGES MODIFIED BY DEMOGRAPHIC UPDATE ON 09/24 AT 2341 QA FLAGS AND/OR RANGES MODIFIED BY DEMOGRAPHIC UPDATE ON 09/25 AT 0831 QA FLAGS AND/OR RANGES MODIFIED BY DEMOGRAPHIC UPDATE ON 09/25 AT 0902 QA FLAGS AND/OR RANGES MODIFIED BY DEMOGRAPHIC UPDATE ON 09/25 AT 0943 QA FLAGS AND/OR RANGES MODIFIED BY DEMOGRAPHIC UPDATE ON 09/25 AT 1323 QA FLAGS AND/OR RANGES MODIFIED BY DEMOGRAPHIC UPDATE ON 09/25 AT 1703 QA FLAGS AND/OR RANGES MODIFIED BY DEMOGRAPHIC UPDATE ON 09/25 AT 1847 (NOTE) The Xpert Xpress SARS-CoV-2/FLU/RSV assay is intended as an aid in  the diagnosis of influenza from Nasopharyngeal swab specimens and  should not be used as a sole basis for treatment. Nasal washings and  aspirates are unacceptable for Xpert Xpress SARS-CoV-2/FLU/RSV  testing.  Fact Sheet for Patients: https://www.moore.com/https://www.fda.gov/media/142436/download  Fact Sheet for Healthcare Providers: Disc[employer]Sur[employer].athttps://www.fda.gov/me dia/142435/download  This test is not yet approved or cleared by the [place] FDA and  has been authorized for detection and/or diagnosis of SARS-CoV-2 by  FDA under an Emergency Use Authorization (EUA). This EUA will remain  in effect (meaning this test can be used) for the duration of the  Covid-19 declaration under Section 564(b)(1) of the Act, 21  U.S.C. section 360bbb-3(b)(1), unless the authorization is  terminated or revoked. Performed at [facility], [address] [place], [place] [address]     Radiology Reports CT HEAD WO CONTRAST  Result Date:  07/06/2020 CLINICAL DATA:  Head trauma, minor. EXAM: CT HEAD WITHOUT CONTRAST CT CERVICAL SPINE WITHOUT CONTRAST TECHNIQUE: Multidetector CT imaging of the head and cervical spine was performed following the standard protocol without intravenous contrast. Multiplanar CT image reconstructions of the cervical spine were  also generated. COMPARISON:  CT head/cervical spine 07/18/2018. FINDINGS: CT HEAD FINDINGS Brain: Stable, moderate generalized cerebral atrophy. Stable, mild ill-defined hypoattenuation within the cerebral white matter which is nonspecific, but likely reflects sequela of chronic small vessel ischemic disease. There is no acute intracranial hemorrhage. No demarcated cortical infarct. No extra-axial fluid collection. No evidence of intracranial mass. No midline shift. Vascular: No hyperdense vessel.  Atherosclerotic calcifications. Skull: Normal. Negative for fracture or focal lesion. Sinuses/Orbits: Visualized orbits show no acute finding. Mild ethmoid sinus mucosal thickening. Frothy secretions within the left frontal sinus. No significant mastoid effusion. CT CERVICAL SPINE FINDINGS Alignment: A mild cervical levocurvature may be positional. Trace C4-C5 grade 1 anterolisthesis Skull base and vertebrae: The basion-dental and atlanto-dental intervals are maintained.No evidence of acute fracture to the cervical spine. 4 mm sclerotic focus within the left C4 articular pillar, nonspecific but possibly reflecting a bone island. Soft tissues and spinal canal: No prevertebral fluid or swelling. No visible canal hematoma. Disc levels: Cervical spondylosis. Most notably at C5-C6 and C6-C7 there is moderate disc degeneration with posterior disc osteophytes and uncovertebral hypertrophy. The C5-C6 posterior disc osteophyte contributes to moderate/severe spinal canal stenosis. Moderate/severe spinal canal stenosis at these levels with possible spinal cord mass effect. Upper chest: No consolidation within the  imaged lung apices. No visible pneumothorax Other: Multiple thyroid nodules, the largest arising from the right lobe measuring 2.2 cm IMPRESSION: CT head: 1. No evidence of acute intracranial abnormality. 2. Stable, moderate generalized cerebral atrophy. 3. Stable, mild cerebral white matter chronic small vessel ischemic disease. 4. Left frontal sinusitis.  Mild ethmoid sinus mucosal thickening. CT cervical spine: 1. No evidence of acute fracture to the cervical spine. 2. Minimal C4-C5 grade 1 anterolisthesis. 3. Cervical spondylosis greatest at C5-C6 and C6-C7 where prominent posterior disc osteophyte complexes contribute to multifactorial moderate/severe spinal canal stenosis with possible spinal cord mass effect. 4. Multiple thyroid nodules, the largest within the right lobe measuring 2.2 cm. Nonemergent thyroid ultrasound is recommended for further evaluation. Electronically Signed   By: Jackey Loge DO   On: 07/06/2020 18:28   CT Cervical Spine Wo Contrast  Result Date: 07/06/2020 CLINICAL DATA:  Head trauma, minor. EXAM: CT HEAD WITHOUT CONTRAST CT CERVICAL SPINE WITHOUT CONTRAST TECHNIQUE: Multidetector CT imaging of the head and cervical spine was performed following the standard protocol without intravenous contrast. Multiplanar CT image reconstructions of the cervical spine were also generated. COMPARISON:  CT head/cervical spine 07/18/2018. FINDINGS: CT HEAD FINDINGS Brain: Stable, moderate generalized cerebral atrophy. Stable, mild ill-defined hypoattenuation within the cerebral white matter which is nonspecific, but likely reflects sequela of chronic small vessel ischemic disease. There is no acute intracranial hemorrhage. No demarcated cortical infarct. No extra-axial fluid collection. No evidence of intracranial mass. No midline shift. Vascular: No hyperdense vessel.  Atherosclerotic calcifications. Skull: Normal. Negative for fracture or focal lesion. Sinuses/Orbits: Visualized orbits show no  acute finding. Mild ethmoid sinus mucosal thickening. Frothy secretions within the left frontal sinus. No significant mastoid effusion. CT CERVICAL SPINE FINDINGS Alignment: A mild cervical levocurvature may be positional. Trace C4-C5 grade 1 anterolisthesis Skull base and vertebrae: The basion-dental and atlanto-dental intervals are maintained.No evidence of acute fracture to the cervical spine. 4 mm sclerotic focus within the left C4 articular pillar, nonspecific but possibly reflecting a bone island. Soft tissues and spinal canal: No prevertebral fluid or swelling. No visible canal hematoma. Disc levels: Cervical spondylosis. Most notably at C5-C6 and C6-C7 there is moderate disc degeneration with posterior disc osteophytes  and uncovertebral hypertrophy. The C5-C6 posterior disc osteophyte contributes to moderate/severe spinal canal stenosis. Moderate/severe spinal canal stenosis at these levels with possible spinal cord mass effect. Upper chest: No consolidation within the imaged lung apices. No visible pneumothorax Other: Multiple thyroid nodules, the largest arising from the right lobe measuring 2.2 cm IMPRESSION: CT head: 1. No evidence of acute intracranial abnormality. 2. Stable, moderate generalized cerebral atrophy. 3. Stable, mild cerebral white matter chronic small vessel ischemic disease. 4. Left frontal sinusitis.  Mild ethmoid sinus mucosal thickening. CT cervical spine: 1. No evidence of acute fracture to the cervical spine. 2. Minimal C4-C5 grade 1 anterolisthesis. 3. Cervical spondylosis greatest at C5-C6 and C6-C7 where prominent posterior disc osteophyte complexes contribute to multifactorial moderate/severe spinal canal stenosis with possible spinal cord mass effect. 4. Multiple thyroid nodules, the largest within the right lobe measuring 2.2 cm. Nonemergent thyroid ultrasound is recommended for further evaluation. Electronically Signed   By: Jackey Loge DO   On: 07/06/2020 18:28   DG Chest  Portable 1 View  Result Date: 07/06/2020 CLINICAL DATA:  Weakness EXAM: PORTABLE CHEST 1 VIEW COMPARISON:  None. FINDINGS: Single frontal view of the chest demonstrates an unremarkable cardiac silhouette. No airspace disease, effusion, or pneumothorax. No acute bony abnormality. IMPRESSION: 1. No acute intrathoracic process. Electronically Signed   By: Sharlet Salina M.D.   On: 07/06/2020 21:49    Lab Data:  CBC: Recent Labs  Lab 07/06/20 2003 07/07/20 0926 07/08/20 0538  WBC 14.5* 8.2 5.4  NEUTROABS 12.4*  --   --   HGB 12.4* 10.2* 9.5*  HCT 38.8* 32.1* 29.7*  MCV 86.4 87.0 88.7  PLT 208 158 146*   Basic Metabolic Panel: Recent Labs  Lab 07/06/20 2003 07/07/20 0348 07/08/20 0538  NA 140 143 141  K 4.2 3.8 4.5  CL 105 114* 115*  CO2 20* 22 19*  GLUCOSE 258* 138* 81  BUN 48* 46* 25*  CREATININE 1.56* 1.33* 0.95  CALCIUM 9.5 8.3* 7.8*   GFR: Estimated Creatinine Clearance (by C-G formula based on SCr of 0.95 mg/dL) Female: 50.2 mL/min Female: 64.8 mL/min Liver Function Tests: Recent Labs  Lab 07/06/20 2003 07/07/20 0348 07/08/20 0538  AST 136* 115* 119*  ALT 51* 41 <5  ALKPHOS 65 48 40  BILITOT 0.9 0.7 1.4*  PROT 6.8 5.1* 4.7*  ALBUMIN 3.6 2.7* 2.4*   No results for input(s): LIPASE, AMYLASE in the last 168 hours. No results for input(s): AMMONIA in the last 168 hours. Coagulation Profile: No results for input(s): INR, PROTIME in the last 168 hours. Cardiac Enzymes: Recent Labs  Lab 07/06/20 2003 07/07/20 0348 07/08/20 0538  CKTOTAL 6,270* 4,716* 2,715*   BNP (last 3 results) No results for input(s): PROBNP in the last 8760 hours. HbA1C: Recent Labs    07/06/20 2003  HGBA1C 6.2*   CBG: Recent Labs  Lab 07/07/20 1156 07/07/20 1626 07/07/20 2118 07/08/20 0756 07/08/20 1200  GLUCAP 158* 104* 109* 80 114*   Lipid Profile: No results for input(s): CHOL, HDL, LDLCALC, TRIG, CHOLHDL, LDLDIRECT in the last 72 hours. Thyroid Function Tests: No  results for input(s): TSH, T4TOTAL, FREET4, T3FREE, THYROIDAB in the last 72 hours. Anemia Panel: No results for input(s): VITAMINB12, FOLATE, FERRITIN, TIBC, IRON, RETICCTPCT in the last 72 hours. Urine analysis:    Component Value Date/Time   COLORURINE YELLOW 07/07/2020 0902   APPEARANCEUR HAZY (A) 07/07/2020 0902   LABSPEC 1.021 07/07/2020 0902   PHURINE 5.0 07/07/2020 0902  GLUCOSEU 50 (A) 07/07/2020 0902   HGBUR LARGE (A) 07/07/2020 0902   BILIRUBINUR NEGATIVE 07/07/2020 0902   KETONESUR NEGATIVE 07/07/2020 0902   PROTEINUR NEGATIVE 07/07/2020 0902   NITRITE NEGATIVE 07/07/2020 0902   LEUKOCYTESUR NEGATIVE 07/07/2020 0902     Siniyah Evangelist M.D. [facility] 07/08/2020, 12:18 PM   Call night coverage person covering after 7pm

## 2020-07-09 LAB — BASIC METABOLIC PANEL
Anion gap: 7 (ref 5–15)
Anion gap: 10 (ref 5–15)
BUN: 46 mg/dL — ABNORMAL HIGH (ref 8–23)
BUN: 19 mg/dL (ref 8–23)
CO2: 22 mmol/L (ref 22–32)
CO2: 22 mmol/L (ref 22–32)
Calcium: 8.3 mg/dL — ABNORMAL LOW (ref 8.9–10.3)
Calcium: 8.4 mg/dL — ABNORMAL LOW (ref 8.9–10.3)
Chloride: 114 mmol/L — ABNORMAL HIGH (ref 98–111)
Chloride: 108 mmol/L (ref 98–111)
Creatinine, Ser: 1.33 mg/dL — ABNORMAL HIGH (ref 0.61–1.24)
Creatinine, Ser: 0.85 mg/dL (ref 0.61–1.24)
Glucose, Bld: 138 mg/dL — ABNORMAL HIGH (ref 70–99)
Glucose, Bld: 96 mg/dL (ref 70–99)
Potassium: 3.8 mmol/L (ref 3.5–5.1)
Potassium: 4 mmol/L (ref 3.5–5.1)
Sodium: 143 mmol/L (ref 135–145)
Sodium: 140 mmol/L (ref 135–145)

## 2020-07-09 LAB — URINALYSIS, ROUTINE W REFLEX MICROSCOPIC
Bilirubin Urine: NEGATIVE
Glucose, UA: 50 mg/dL — AB
Ketones, ur: NEGATIVE mg/dL
Leukocytes,Ua: NEGATIVE
Nitrite: NEGATIVE
Protein, ur: NEGATIVE mg/dL
Specific Gravity, Urine: 1.021 (ref 1.005–1.030)
pH: 5 (ref 5.0–8.0)

## 2020-07-09 LAB — CK
Total CK: 6270 U/L — ABNORMAL HIGH (ref 49–397)
Total CK: 4716 U/L — ABNORMAL HIGH (ref 49–397)
Total CK: 2715 U/L — ABNORMAL HIGH (ref 49–397)
Total CK: 1516 U/L — ABNORMAL HIGH (ref 49–397)

## 2020-07-09 LAB — HEPATIC FUNCTION PANEL
ALT: 41 U/L (ref 0–44)
AST: 115 U/L — ABNORMAL HIGH (ref 15–41)
Albumin: 2.7 g/dL — ABNORMAL LOW (ref 3.5–5.0)
Alkaline Phosphatase: 48 U/L (ref 38–126)
Bilirubin, Direct: <0.1 mg/dL (ref 0.0–0.2)
Total Bilirubin: 0.7 mg/dL (ref 0.3–1.2)
Total Protein: 5.1 g/dL — ABNORMAL LOW (ref 6.5–8.1)

## 2020-07-09 LAB — COMPREHENSIVE METABOLIC PANEL
ALT: 51 U/L — ABNORMAL HIGH (ref 0–44)
ALT: <5 U/L (ref 0–44)
AST: 136 U/L — ABNORMAL HIGH (ref 15–41)
AST: 119 U/L — ABNORMAL HIGH (ref 15–41)
Albumin: 3.6 g/dL (ref 3.5–5.0)
Albumin: 2.4 g/dL — ABNORMAL LOW (ref 3.5–5.0)
Alkaline Phosphatase: 65 U/L (ref 38–126)
Alkaline Phosphatase: 40 U/L (ref 38–126)
Anion gap: 15 (ref 5–15)
Anion gap: 7 (ref 5–15)
BUN: 48 mg/dL — ABNORMAL HIGH (ref 8–23)
BUN: 25 mg/dL — ABNORMAL HIGH (ref 8–23)
CO2: 20 mmol/L — ABNORMAL LOW (ref 22–32)
CO2: 19 mmol/L — ABNORMAL LOW (ref 22–32)
Calcium: 9.5 mg/dL (ref 8.9–10.3)
Calcium: 7.8 mg/dL — ABNORMAL LOW (ref 8.9–10.3)
Chloride: 105 mmol/L (ref 98–111)
Chloride: 115 mmol/L — ABNORMAL HIGH (ref 98–111)
Creatinine, Ser: 1.56 mg/dL — ABNORMAL HIGH (ref 0.61–1.24)
Creatinine, Ser: 0.95 mg/dL (ref 0.61–1.24)
Glucose, Bld: 258 mg/dL — ABNORMAL HIGH (ref 70–99)
Glucose, Bld: 81 mg/dL (ref 70–99)
Potassium: 4.2 mmol/L (ref 3.5–5.1)
Potassium: 4.5 mmol/L (ref 3.5–5.1)
Sodium: 140 mmol/L (ref 135–145)
Sodium: 141 mmol/L (ref 135–145)
Total Bilirubin: 0.9 mg/dL (ref 0.3–1.2)
Total Bilirubin: 1.4 mg/dL — ABNORMAL HIGH (ref 0.3–1.2)
Total Protein: 6.8 g/dL (ref 6.5–8.1)
Total Protein: 4.7 g/dL — ABNORMAL LOW (ref 6.5–8.1)

## 2020-07-09 LAB — CBC
HCT: 32.1 % — ABNORMAL LOW (ref 39.0–52.0)
HCT: 29.7 % — ABNORMAL LOW (ref 39.0–52.0)
Hemoglobin: 10.2 g/dL — ABNORMAL LOW (ref 13.0–17.0)
Hemoglobin: 9.5 g/dL — ABNORMAL LOW (ref 13.0–17.0)
MCH: 27.6 pg (ref 26.0–34.0)
MCH: 28.4 pg (ref 26.0–34.0)
MCHC: 31.8 g/dL (ref 30.0–36.0)
MCHC: 32 g/dL (ref 30.0–36.0)
MCV: 87 fL (ref 80.0–100.0)
MCV: 88.7 fL (ref 80.0–100.0)
Platelets: 158 10*3/uL (ref 150–400)
Platelets: 146 10*3/uL — ABNORMAL LOW (ref 150–400)
RBC: 3.69 MIL/uL — ABNORMAL LOW (ref 4.22–5.81)
RBC: 3.35 MIL/uL — ABNORMAL LOW (ref 4.22–5.81)
RDW: 15.9 % — ABNORMAL HIGH (ref 11.5–15.5)
RDW: 15.6 % — ABNORMAL HIGH (ref 11.5–15.5)
WBC: 8.2 10*3/uL (ref 4.0–10.5)
WBC: 5.4 10*3/uL (ref 4.0–10.5)
nRBC: 0 % (ref 0.0–0.2)
nRBC: 0 % (ref 0.0–0.2)

## 2020-07-09 LAB — HEMOGLOBIN A1C
Hgb A1c MFr Bld: 6.2 % — ABNORMAL HIGH (ref 4.8–5.6)
Mean Plasma Glucose: 131.24 mg/dL

## 2020-07-09 LAB — GLUCOSE, CAPILLARY
Glucose-Capillary: 154 mg/dL — ABNORMAL HIGH (ref 70–99)
Glucose-Capillary: 103 mg/dL — ABNORMAL HIGH (ref 70–99)
Glucose-Capillary: 158 mg/dL — ABNORMAL HIGH (ref 70–99)
Glucose-Capillary: 104 mg/dL — ABNORMAL HIGH (ref 70–99)
Glucose-Capillary: 109 mg/dL — ABNORMAL HIGH (ref 70–99)
Glucose-Capillary: 80 mg/dL (ref 70–99)
Glucose-Capillary: 114 mg/dL — ABNORMAL HIGH (ref 70–99)
Glucose-Capillary: 81 mg/dL (ref 70–99)
Glucose-Capillary: 173 mg/dL — ABNORMAL HIGH (ref 70–99)
Glucose-Capillary: 148 mg/dL — ABNORMAL HIGH (ref 70–99)
Glucose-Capillary: 112 mg/dL — ABNORMAL HIGH (ref 70–99)
Glucose-Capillary: 119 mg/dL — ABNORMAL HIGH (ref 70–99)
Glucose-Capillary: 164 mg/dL — ABNORMAL HIGH (ref 70–99)

## 2020-07-09 LAB — CBC WITH DIFFERENTIAL/PLATELET
Abs Immature Granulocytes: 0.07 10*3/uL (ref 0.00–0.07)
Basophils Absolute: 0 10*3/uL (ref 0.0–0.1)
Basophils Relative: 0 %
Eosinophils Absolute: 0 10*3/uL (ref 0.0–0.5)
Eosinophils Relative: 0 %
HCT: 38.8 % — ABNORMAL LOW (ref 39.0–52.0)
Hemoglobin: 12.4 g/dL — ABNORMAL LOW (ref 13.0–17.0)
Immature Granulocytes: 1 %
Lymphocytes Relative: 7 %
Lymphs Abs: 1 10*3/uL (ref 0.7–4.0)
MCH: 27.6 pg (ref 26.0–34.0)
MCHC: 32 g/dL (ref 30.0–36.0)
MCV: 86.4 fL (ref 80.0–100.0)
Monocytes Absolute: 1 10*3/uL (ref 0.1–1.0)
Monocytes Relative: 7 %
Neutro Abs: 12.4 10*3/uL — ABNORMAL HIGH (ref 1.7–7.7)
Neutrophils Relative %: 85 %
Platelets: 208 10*3/uL (ref 150–400)
RBC: 4.49 MIL/uL (ref 4.22–5.81)
RDW: 15.8 % — ABNORMAL HIGH (ref 11.5–15.5)
WBC: 14.5 10*3/uL — ABNORMAL HIGH (ref 4.0–10.5)
nRBC: 0 % (ref 0.0–0.2)

## 2020-07-09 LAB — RESPIRATORY PANEL BY RT PCR (FLU A&B, COVID)
Influenza A by PCR: NEGATIVE
Influenza B by PCR: NEGATIVE
SARS Coronavirus 2 by RT PCR: NEGATIVE

## 2020-07-09 LAB — SARS CORONAVIRUS 2 (TAT 6-24 HRS): SARS Coronavirus 2: NEGATIVE

## 2020-07-09 NOTE — Progress Notes (Signed)
Physical Therapy Treatment Patient Details Name: Sherri Pratt MRN: [id number] DOB: Jan 19, 1946 Today's Date: 07/09/2020    History of Present Illness 74 y.o. female admitted with recurrent falls, rhabdo, and AKI. PMHx significant for PAF on eliquis, DM2, HTN, gout, parkinson's dz.    PT Comments    Pt was seen for visit to work on standing and transfers, and was able to sit up in chair with HHA to transition.  Her standing at [facility] bedside was improving as she practiced, and is making sidesteps to get to top of bed for return to bed before electing to sit in chair.  Her plan is to get strength and endurance back for gait which will allow her to return home.  Follow up with her as she continues the process of getting her approval and location of rehab finalized.  Work on gait with varying devices as challenge.   Follow Up Recommendations  SNF     Equipment Recommendations  None recommended by PT    Recommendations for Other Services       Precautions / Restrictions Precautions Precautions: Fall Precaution Comments: Recurrent falls without injury Restrictions Weight Bearing Restrictions: No    Mobility  Bed Mobility Overal bed mobility: Needs Assistance Bed Mobility: Supine to Sit     Supine to sit: Mod assist;Max assist        Transfers Overall transfer level: Needs assistance Equipment used: Rolling walker (2 wheeled) Transfers: Sit to/from [employer] Sit to Stand: Mod assist Stand pivot transfers: Min assist;Mod assist       General transfer comment: stood three times and sidestepped to chair  Ambulation/Gait             General Gait Details: steps related to transfers   Stairs             Wheelchair Mobility    Modified Rankin (Stroke Patients Only)       Balance Overall balance assessment: Needs assistance Sitting-balance support: Bilateral upper extremity supported;Feet supported Sitting balance-Leahy Scale: Fair      Standing balance support: Bilateral upper extremity supported;During functional activity Standing balance-Leahy Scale: Fair                              Cognition Arousal/Alertness: Awake/alert Behavior During Therapy: Flat affect Overall Cognitive Status: Within Functional Limits for tasks assessed                                 General Comments: pt is interested in getting OOB but weak      Exercises      General Comments General comments (skin integrity, edema, etc.): pt could stand at walker and control balance, take sidesteps and recover, balance when shifting to chairr      Pertinent Vitals/Pain Pain Assessment: No/denies pain Faces Pain Scale: Hurts little more Pain Location: generally with LE's Pain Descriptors / Indicators: Grimacing;Guarding Pain Intervention(s): Limited activity within patient's tolerance;Premedicated before session;Repositioned    Home Living                      Prior Function            PT Goals (current goals can now be found in the care plan section) Acute Rehab PT Goals Patient Stated Goal: to get stronger     Frequency    Min 2X/week  PT Plan Current plan remains appropriate    Co-evaluation              AM-PAC PT "6 Clicks" Mobility   Outcome Measure  Help needed turning from your back to your side while in a flat bed without using bedrails?: A Little Help needed moving from lying on your back to sitting on the side of a flat bed without using bedrails?: A Little Help needed moving to and from a bed to a chair (including a wheelchair)?: A Lot Help needed standing up from a chair using your arms (e.g., wheelchair or bedside chair)?: A Lot Help needed to walk in hospital room?: A Lot Help needed climbing 3-5 steps with a railing? : Total 6 Click Score: 13    End of Session Equipment Utilized During Treatment: Gait belt Activity Tolerance: Patient limited by fatigue;Patient  limited by lethargy Patient left: in bed;with call bell/phone within reach Nurse Communication: Mobility status PT Visit Diagnosis: Unsteadiness on feet (R26.81);Other abnormalities of gait and mobility (R26.89);Repeated falls (R29.6);Muscle weakness (generalized) (M62.81)     Time: 9528-4132 PT Time Calculation (min) (ACUTE ONLY): 23 min  Charges:  $Therapeutic Activity: 8-22 mins $Neuromuscular Re-education: 8-22 mins                     Ivar Drape 07/09/2020, 5:06 PM  Samul Dada, PT MS Acute Rehab Dept. Number: [facility] [id number] and [facility] [phone]

## 2020-07-09 NOTE — Discharge Instructions (Signed)

## 2020-07-09 NOTE — TOC Progression Note (Signed)
Transition of Care [facility]) - Progression Note    Patient Details  Name: Sherri Pratt MRN: [id number] Date of Birth: Oct 14, 1945  Transition of Care [facility]) CM/SW Contact  Erin Sons, [place] Phone Number: 07/09/2020, 3:21 PM  Clinical Narrative:     CSW informed pt that [facility] is unable to accept bed request. Pt has not yet reviewed her list of bed offers yet. States she will review and choose from there.   Expected Discharge Plan: Skilled Nursing Facility Barriers to Discharge: Continued Medical Work up, SNF Pending bed offer  Expected Discharge Plan and Services Expected Discharge Plan: Skilled Nursing Facility     Post Acute Care Choice: Skilled Nursing Facility Living arrangements for the past 2 months: Single Family Home                                       Social Determinants of Health (SDOH) Interventions    Readmission Risk Interventions No flowsheet data found.

## 2020-07-09 NOTE — TOC Progression Note (Signed)
Transition of Care [facility]) - Progression Note    Patient Details  Name: Sherri Pratt MRN: [id number] Date of Birth: [birth year]  Transition of Care [facility]) CM/SW Contact  Doy Hut[place], [place] Phone Number:  07/09/2020, 12:43 PM  Clinical Narrative:    CSW has f/u with [facility], have yet to receive a response at this time for offer. Prior auth started under ref #[id number]. New COVID swab requested from MD. CSW provided pt with offers at bedside. Explained she needed to find alternative placement choice from her provided offers in case [facility] cannot offer. Pt states understanding.     Expected Discharge Plan: Skilled Nursing Facility Barriers to Discharge: Continued Medical Work up, SNF Pending bed offer  Expected Discharge Plan and Services Expected Discharge Plan: Skilled Nursing Facility Post Acute Care Choice: Skilled Nursing Facility Living arrangements for the past 2 months: Single Family Home  Readmission Risk Interventions No flowsheet data found.

## 2020-07-09 NOTE — Progress Notes (Signed)
[facility]                                                                              Patient Demographics  Sherri Pratt, is a 74 y.o. adult, DOB - Nov 22, 1945, KZS:[id number]  Admit date - 07/06/2020   Admitting Physician Hillary Bow, DO  Outpatient Primary MD for the patient is [facility], [facility], MD  Outpatient specialists:   LOS - 2  days   Medical records reviewed and are as summarized below:    Chief Complaint  Patient presents with  . Fall       Brief summary   Patient is a 74 year old female with history of paroxysmal A. fib on Eliquis, diabetes mellitus type 2, hypertension, gout, Parkinson's disease, presented after fall at home.  Patient reported 2 falls in the last week.  On the day of admission had a fall off of the toilet, was on the floor for a while.  Patient's husband has dementia and was unable to help the patient get up.  Patient was brought to ED by EMS.  No loss of consciousness with fall, did hit her head CT head negative, CPK 6270, AKI with creatinine 1.5.  UA negative for UTI  Assessment & Plan    Principal Problem:   Traumatic rhabdomyolysis (HCC) secondary to mechanical fall in the setting of Parkinson's disease -CK 6270 at the time of admission with AKI -Creatinine has improved to 0.85, CK improving 1516  Active Problems:   AKI (acute kidney injury) (HCC) -Baseline creatinine 1.0, presented with creatinine of 1.5 secondary to #1 -Resolved, creatinine 0.8    PAF (paroxysmal atrial fibrillation) (HCC)  -On Eliquis.  Heart rate controlled -Discussed in detail with the patient regarding anticoagulation given recent falls and high risk of bleeding.  Patient wants to continue Eliquis at this time.    DM2 (diabetes mellitus, type 2) (HCC) -CBG stable, hold glipizide continue SSI     HTN (hypertension) -BP readings are somewhat elevated, if persistent, will place on Norvasc.    Parkinson's disease (HCC) -Currently on  Sinemet, frequent falls, tremors worsened in last week, neurology was consulted -Recommended increasing Sinemet to 1 tab p.o. 4 times daily, PT OT and outpatient neurology follow-up. -PT evaluation recommending SNF  Hyperlipidemia Given elevated CPK, LFTs, hold simvastatin  Transaminitis Elevated AST, ALT, likely due to #1, currently no abdominal pain -LFTs have improved  Obesity Estimated body mass index is 30.9 kg/m as calculated from the following:   Height as of this encounter: 5\' 4"  (1.626 m).   Weight as of this encounter: 81.6 kg.  Code Status: Full CODE STATUS DVT Prophylaxis: Apixaban Family Communication: Discussed all imaging results, lab results, explained to the patient and son, Mr Lindyn Vossler today   Disposition Plan:     Status is: Observation  The patient will require care spanning > 2 midnights and should be moved to inpatient because: Inpatient level of care appropriate due to severity of illness  Dispo: The patient is from: Home              Anticipated d/c is to: SNF  Anticipated d/c date is: 1 day              Patient currently is medically stable to d/c.  DC to SNF, awaiting bed, patient interested in [facility] skilled nursing facility     Time Spent in minutes   35 mins   Procedures:  None  Consultants:   Neurology  Antimicrobials:   Anti-infectives (From admission, onward)   None         Medications  Scheduled Meds: . allopurinol  300 mg Oral Daily  . apixaban  5 mg Oral BID  . carbidopa-levodopa  1 tablet Oral QID  . influenza vaccine adjuvanted  0.5 mL Intramuscular Tomorrow-1000  . insulin aspart  0-15 Units Subcutaneous TID WC  . insulin aspart  0-5 Units Subcutaneous QHS   Continuous Infusions:  PRN Meds:.acetaminophen **OR** acetaminophen, ondansetron **OR** ondansetron (ZOFRAN) IV, pneumococcal 23 valent vaccine      Subjective:   Sherri Pratt was seen and examined today.  No acute complaints.  States  tremors are now improving.  No pain.  No chest pain, shortness of breath, fevers or chills.  No abdominal pain.    Objective:   Vitals:   07/08/20 1420 07/08/20 2051 07/09/20 0442 07/09/20 1419  BP: (!) 160/97 (!) 141/62 (!) 165/66 (!) 134/50  Pulse: 65 64 61 64  Resp: 16 17 16 16   Temp: 98.4 F (36.9 C) (!) 97.5 F (36.4 C) 97.7 F (36.5 C) (!) 97.5 F (36.4 C)  TempSrc: Oral   Oral  SpO2: 98% 99% 99% 96%  Weight:      Height:        Intake/Output Summary (Last 24 hours) at 07/09/2020 1438 Last data filed at 07/09/2020 1115 Gross per 24 hour  Intake 2152.49 ml  Output 4200 ml  Net -2047.51 ml     Wt Readings from Last 3 Encounters:  07/06/20 81.6 kg   Physical Exam  General: Alert and oriented x 3, NAD  Cardiovascular: S1 S2 clear, RRR. No pedal edema b/l  Respiratory: CTAB, no wheezing, rales or rhonchi  Gastrointestinal: Soft, nontender, nondistended, NBS  Ext: no pedal edema bilaterally  Neuro: no new deficits, resting tremor bilateral hands, mildly improving  Musculoskeletal: No cyanosis, clubbing  Skin: No rashes  Psych: Normal affect and demeanor, alert and oriented x3     Data Reviewed:  I have personally reviewed following labs and imaging studies  Micro Results Recent Results (from the past 240 hour(s))  Respiratory Panel by RT PCR (Flu A&B, Covid) - Nasopharyngeal Swab     Status: None   Collection Time: 07/06/20  9:25 PM   Specimen: Nasopharyngeal Swab  Result Value Ref Range Status   SARS Coronavirus 2 by RT PCR NEGATIVE  Final    Comment: QA FLAGS AND/OR RANGES MODIFIED BY DEMOGRAPHIC UPDATE ON 09/24 AT 2338 QA FLAGS AND/OR RANGES MODIFIED BY DEMOGRAPHIC UPDATE ON 09/24 AT 2341 QA FLAGS AND/OR RANGES MODIFIED BY DEMOGRAPHIC UPDATE ON 09/25 AT 0831 QA FLAGS AND/OR RANGES MODIFIED BY DEMOGRAPHIC UPDATE ON 09/25 AT 0902 QA FLAGS AND/OR RANGES MODIFIED BY DEMOGRAPHIC UPDATE ON 09/25 AT [id number] QA FLAGS AND/OR RANGES MODIFIED BY DEMOGRAPHIC  UPDATE ON 09/25 AT 1323 QA FLAGS AND/OR RANGES MODIFIED BY DEMOGRAPHIC UPDATE ON 09/25 AT 1703 QA FLAGS AND/OR RANGES MODIFIED BY DEMOGRAPHIC UPDATE ON 09/25 AT 1847 QA FLAGS AND/OR RANGES MODIFIED BY DEMOGRAPHIC UPDATE ON 09/26 AT 1704 QA FLAGS AND/OR RANGES MODIFIED BY DEMOGRAPHIC UPDATE ON 09/26 AT [id number] QA FLAGS  AND/OR RANGES MODIFIED BY DEMOGRAPHIC UPDATE ON 09/27 AT 0930 (NOTE) SARS-CoV-2 target nucleic acids are NOT DETECTED.  The SARS-CoV-2 RNA is generally detectable in upper respiratoy specimens during the acute phase of infection. The lowest concentration of SARS-Co V-2 viral copies this assay can detect is 131 copies/mL. A negative result does not preclude SARS-Cov-2 infection and should not be used as the sole basis for treatment or other patient management decisions. A negative result may occur with  improper specimen collection/handling, submission of specimen other than nasopharyngeal swab, presence of viral mutation(s) within the areas targeted by this assay, and inadequate number of viral copies (<131 copies/mL). A negative result must be combined with clinical observations, patient history, and epidemiological information. The expected result is Negative.  Fact Sheet for Patients:  https://www.moore.com/  Fact Sheet for Healthcare Providers:  https://www.young.biz/  This test is not yet approved or cleared by the [place] FDA and  has been authorized for detection and/or diagnosis of SARS-CoV-2 by FDA under an Emergency Use Authorization (EUA). This EUA will remain  in effect ( meaning this test can be used) for the duration of the COVID-19 declaration under Section 564(b)(1) of the Act, 21 U.S.C. section 360bbb-3(b)(1), unless the authorization is terminated or revoked sooner.     Influenza A by PCR NEGATIVE  Final    Comment: QA FLAGS AND/OR RANGES MODIFIED BY DEMOGRAPHIC UPDATE ON 09/24 AT 2338 QA FLAGS AND/OR  RANGES MODIFIED BY DEMOGRAPHIC UPDATE ON 09/24 AT 2341 QA FLAGS AND/OR RANGES MODIFIED BY DEMOGRAPHIC UPDATE ON 09/25 AT 0831 QA FLAGS AND/OR RANGES MODIFIED BY DEMOGRAPHIC UPDATE ON 09/25 AT 0902 QA FLAGS AND/OR RANGES MODIFIED BY DEMOGRAPHIC UPDATE ON 09/25 AT 0943 QA FLAGS AND/OR RANGES MODIFIED BY DEMOGRAPHIC UPDATE ON 09/25 AT 1323 QA FLAGS AND/OR RANGES MODIFIED BY DEMOGRAPHIC UPDATE ON 09/25 AT 1703 QA FLAGS AND/OR RANGES MODIFIED BY DEMOGRAPHIC UPDATE ON 09/25 AT 1847 QA FLAGS AND/OR RANGES MODIFIED BY DEMOGRAPHIC UPDATE ON 09/26 AT 1704 QA FLAGS AND/OR RANGES MODIFIED BY DEMOGRAPHIC UPDATE ON 09/26 AT 1838 QA FLAGS AND/OR RANGES MODIFIED BY DEMOGRAPHIC UPDATE ON 09/27 AT 0930    Influenza B by PCR NEGATIVE  Final    Comment: QA FLAGS AND/OR RANGES MODIFIED BY DEMOGRAPHIC UPDATE ON 09/24 AT 2338 QA FLAGS AND/OR RANGES MODIFIED BY DEMOGRAPHIC UPDATE ON 09/24 AT 2341 QA FLAGS AND/OR RANGES MODIFIED BY DEMOGRAPHIC UPDATE ON 09/25 AT 0831 QA FLAGS AND/OR RANGES MODIFIED BY DEMOGRAPHIC UPDATE ON 09/25 AT 0902 QA FLAGS AND/OR RANGES MODIFIED BY DEMOGRAPHIC UPDATE ON 09/25 AT 0943 QA FLAGS AND/OR RANGES MODIFIED BY DEMOGRAPHIC UPDATE ON 09/25 AT 1323 QA FLAGS AND/OR RANGES MODIFIED BY DEMOGRAPHIC UPDATE ON 09/25 AT 1703 QA FLAGS AND/OR RANGES MODIFIED BY DEMOGRAPHIC UPDATE ON 09/25 AT 1847 QA FLAGS AND/OR RANGES MODIFIED BY DEMOGRAPHIC UPDATE ON 09/26 AT 1704 QA FLAGS AND/OR RANGES MODIFIED BY DEMOGRAPHIC UPDATE ON 09/26 AT 1838 QA FLAGS AND/OR RANGES MODIFIED BY DEMOGRAPHIC UPDATE ON 09/27 AT 0930 (NOTE) The Xpert Xpress SARS-CoV-2/FLU/RSV assay is intended as an aid in  the diagnosis of influenza from Nasopharyngeal swab specimens and  should not be used as a sole basis for treatment. Nasal washing s and  aspirates are unacceptable for Xpert Xpress SARS-CoV-2/FLU/RSV  testing.  Fact Sheet for Patients: https://www.moore.com/  Fact Sheet for Healthcare  Providers: https://www.young.biz/  This test is not yet approved or cleared by the [place] FDA and  has been authorized for detection and/or diagnosis of SARS-CoV-2 by  FDA under  an Emergency Use Authorization (EUA). This EUA will remain  in effect (meaning this test can be used) for the duration of the  Covid-19 declaration under Section 564(b)(1) of the Act, 21  U.S.C. section 360bbb-3(b)(1), unless the authorization is  terminated or revoked. Performed at [facility], [address] [place], [place] [address]     Radiology Reports CT HEAD WO CONTRAST  Result Date: 07/06/2020 CLINICAL DATA:  Head trauma, minor. EXAM: CT HEAD WITHOUT CONTRAST CT CERVICAL SPINE WITHOUT CONTRAST TECHNIQUE: Multidetector CT imaging of the head and cervical spine was performed following the standard protocol without intravenous contrast. Multiplanar CT image reconstructions of the cervical spine were also generated. COMPARISON:  CT head/cervical spine 07/18/2018. FINDINGS: CT HEAD FINDINGS Brain: Stable, moderate generalized cerebral atrophy. Stable, mild ill-defined hypoattenuation within the cerebral white matter which is nonspecific, but likely reflects sequela of chronic small vessel ischemic disease. There is no acute intracranial hemorrhage. No demarcated cortical infarct. No extra-axial fluid collection. No evidence of intracranial mass. No midline shift. Vascular: No hyperdense vessel.  Atherosclerotic calcifications. Skull: Normal. Negative for fracture or focal lesion. Sinuses/Orbits: Visualized orbits show no acute finding. Mild ethmoid sinus mucosal thickening. Frothy secretions within the left frontal sinus. No significant mastoid effusion. CT CERVICAL SPINE FINDINGS Alignment: A mild cervical levocurvature may be positional. Trace C4-C5 grade 1 anterolisthesis Skull base and vertebrae: The basion-dental and atlanto-dental intervals are maintained.No evidence of acute  fracture to the cervical spine. 4 mm sclerotic focus within the left C4 articular pillar, nonspecific but possibly reflecting a bone island. Soft tissues and spinal canal: No prevertebral fluid or swelling. No visible canal hematoma. Disc levels: Cervical spondylosis. Most notably at C5-C6 and C6-C7 there is moderate disc degeneration with posterior disc osteophytes and uncovertebral hypertrophy. The C5-C6 posterior disc osteophyte contributes to moderate/severe spinal canal stenosis. Moderate/severe spinal canal stenosis at these levels with possible spinal cord mass effect. Upper chest: No consolidation within the imaged lung apices. No visible pneumothorax Other: Multiple thyroid nodules, the largest arising from the right lobe measuring 2.2 cm IMPRESSION: CT head: 1. No evidence of acute intracranial abnormality. 2. Stable, moderate generalized cerebral atrophy. 3. Stable, mild cerebral white matter chronic small vessel ischemic disease. 4. Left frontal sinusitis.  Mild ethmoid sinus mucosal thickening. CT cervical spine: 1. No evidence of acute fracture to the cervical spine. 2. Minimal C4-C5 grade 1 anterolisthesis. 3. Cervical spondylosis greatest at C5-C6 and C6-C7 where prominent posterior disc osteophyte complexes contribute to multifactorial moderate/severe spinal canal stenosis with possible spinal cord mass effect. 4. Multiple thyroid nodules, the largest within the right lobe measuring 2.2 cm. Nonemergent thyroid ultrasound is recommended for further evaluation. Electronically Signed   By: Jackey Loge DO   On: 07/06/2020 18:28   CT Cervical Spine Wo Contrast  Result Date: 07/06/2020 CLINICAL DATA:  Head trauma, minor. EXAM: CT HEAD WITHOUT CONTRAST CT CERVICAL SPINE WITHOUT CONTRAST TECHNIQUE: Multidetector CT imaging of the head and cervical spine was performed following the standard protocol without intravenous contrast. Multiplanar CT image reconstructions of the cervical spine were also  generated. COMPARISON:  CT head/cervical spine 07/18/2018. FINDINGS: CT HEAD FINDINGS Brain: Stable, moderate generalized cerebral atrophy. Stable, mild ill-defined hypoattenuation within the cerebral white matter which is nonspecific, but likely reflects sequela of chronic small vessel ischemic disease. There is no acute intracranial hemorrhage. No demarcated cortical infarct. No extra-axial fluid collection. No evidence of intracranial mass. No midline shift. Vascular: No hyperdense vessel.  Atherosclerotic calcifications. Skull:  Normal. Negative for fracture or focal lesion. Sinuses/Orbits: Visualized orbits show no acute finding. Mild ethmoid sinus mucosal thickening. Frothy secretions within the left frontal sinus. No significant mastoid effusion. CT CERVICAL SPINE FINDINGS Alignment: A mild cervical levocurvature may be positional. Trace C4-C5 grade 1 anterolisthesis Skull base and vertebrae: The basion-dental and atlanto-dental intervals are maintained.No evidence of acute fracture to the cervical spine. 4 mm sclerotic focus within the left C4 articular pillar, nonspecific but possibly reflecting a bone island. Soft tissues and spinal canal: No prevertebral fluid or swelling. No visible canal hematoma. Disc levels: Cervical spondylosis. Most notably at C5-C6 and C6-C7 there is moderate disc degeneration with posterior disc osteophytes and uncovertebral hypertrophy. The C5-C6 posterior disc osteophyte contributes to moderate/severe spinal canal stenosis. Moderate/severe spinal canal stenosis at these levels with possible spinal cord mass effect. Upper chest: No consolidation within the imaged lung apices. No visible pneumothorax Other: Multiple thyroid nodules, the largest arising from the right lobe measuring 2.2 cm IMPRESSION: CT head: 1. No evidence of acute intracranial abnormality. 2. Stable, moderate generalized cerebral atrophy. 3. Stable, mild cerebral white matter chronic small vessel ischemic  disease. 4. Left frontal sinusitis.  Mild ethmoid sinus mucosal thickening. CT cervical spine: 1. No evidence of acute fracture to the cervical spine. 2. Minimal C4-C5 grade 1 anterolisthesis. 3. Cervical spondylosis greatest at C5-C6 and C6-C7 where prominent posterior disc osteophyte complexes contribute to multifactorial moderate/severe spinal canal stenosis with possible spinal cord mass effect. 4. Multiple thyroid nodules, the largest within the right lobe measuring 2.2 cm. Nonemergent thyroid ultrasound is recommended for further evaluation. Electronically Signed   By: Jackey Loge DO   On: 07/06/2020 18:28   DG Chest Portable 1 View  Result Date: 07/06/2020 CLINICAL DATA:  Weakness EXAM: PORTABLE CHEST 1 VIEW COMPARISON:  None. FINDINGS: Single frontal view of the chest demonstrates an unremarkable cardiac silhouette. No airspace disease, effusion, or pneumothorax. No acute bony abnormality. IMPRESSION: 1. No acute intrathoracic process. Electronically Signed   By: Sharlet Salina M.D.   On: 07/06/2020 21:49    Lab Data:  CBC: Recent Labs  Lab 07/06/20 2003 07/07/20 0926 07/08/20 0538  WBC 14.5 8.2 5.4  NEUTROABS 12.4  --   --   HGB 12.4 10.2 9.5  HCT 38.8 32.1 29.7  MCV 86.4 87.0 88.7  PLT 208 158 146   Basic Metabolic Panel: Recent Labs  Lab 07/06/20 2003 07/07/20 0348 07/08/20 0538 07/09/20 0126  NA 140 143 141 140  K 4.2 3.8 4.5 4.0  CL 105 114 115 108  CO2 GLUCOSE 258 138 81 96  BUN 48 46 25 19  CREATININE 1.56 1.33 0.95 0.85  CALCIUM 9.5 8.3 7.8 8.4   GFR: Estimated Creatinine Clearance (by C-G formula based on SCr of 0.85 mg/dL) Female: 91.4 mL/min Female: 72.4 mL/min Liver Function Tests: Recent Labs  Lab 07/06/20 2003 07/07/20 0348 07/08/20 0538  AST 136 115 119  ALT 51 41 <5  ALKPHOS 65 48 40  BILITOT 0.9 0.7 1.4  PROT 6.8 5.1 4.7  ALBUMIN 3.6 2.7 2.4   No results for input(s): LIPASE, AMYLASE in the last 168 hours. No results for  input(s): AMMONIA in the last 168 hours. Coagulation Profile: No results for input(s): INR, PROTIME in the last 168 hours. Cardiac Enzymes: Recent Labs  Lab 07/06/20 2003 07/07/20 0348 07/08/20 0538 07/09/20 0126  CKTOTAL 6,270 4,716 2,715 1,516   BNP (last 3 results) No results for input(s):  PROBNP in the last 8760 hours. HbA1C: Recent Labs    07/06/20 2003  HGBA1C 6.2   CBG: Recent Labs  Lab 07/08/20 1200 07/08/20 1700 07/08/20 2052 07/09/20 0820 07/09/20 1220  GLUCAP 114 81 173 148 112   Lipid Profile: No results for input(s): CHOL, HDL, LDLCALC, TRIG, CHOLHDL, LDLDIRECT in the last 72 hours. Thyroid Function Tests: No results for input(s): TSH, T4TOTAL, FREET4, T3FREE, THYROIDAB in the last 72 hours. Anemia Panel: No results for input(s): VITAMINB12, FOLATE, FERRITIN, TIBC, IRON, RETICCTPCT in the last 72 hours. Urine analysis:    Component Value Date/Time   COLORURINE YELLOW 07/07/2020 0902   APPEARANCEUR HAZY 07/07/2020 0902   LABSPEC 1.021 07/07/2020 0902   PHURINE 5.0 07/07/2020 0902   GLUCOSEU 50 07/07/2020 0902   HGBUR LARGE 07/07/2020 0902   BILIRUBINUR NEGATIVE 07/07/2020 0902   KETONESUR NEGATIVE 07/07/2020 0902   PROTEINUR NEGATIVE 07/07/2020 0902   NITRITE NEGATIVE 07/07/2020 0902   LEUKOCYTESUR NEGATIVE 07/07/2020 0902     Aluel Schwarz M.D. [facility] 07/09/2020, 2:38 PM   Call night coverage person covering after 7pm

## 2020-07-10 DIAGNOSIS — E782 Mixed hyperlipidemia: Secondary | ICD-10-CM | POA: Diagnosis not present

## 2020-07-10 DIAGNOSIS — W19XXXA Unspecified fall, initial encounter: Secondary | ICD-10-CM | POA: Diagnosis not present

## 2020-07-10 DIAGNOSIS — E119 Type 2 diabetes mellitus without complications: Secondary | ICD-10-CM | POA: Diagnosis not present

## 2020-07-10 DIAGNOSIS — I480 Paroxysmal atrial fibrillation: Secondary | ICD-10-CM | POA: Diagnosis not present

## 2020-07-10 DIAGNOSIS — E669 Obesity, unspecified: Secondary | ICD-10-CM | POA: Diagnosis not present

## 2020-07-10 DIAGNOSIS — R2681 Unsteadiness on feet: Secondary | ICD-10-CM | POA: Diagnosis not present

## 2020-07-10 DIAGNOSIS — R7401 Elevation of levels of liver transaminase levels: Secondary | ICD-10-CM | POA: Diagnosis not present

## 2020-07-10 DIAGNOSIS — M6282 Rhabdomyolysis: Secondary | ICD-10-CM | POA: Diagnosis not present

## 2020-07-10 DIAGNOSIS — N179 Acute kidney failure, unspecified: Secondary | ICD-10-CM | POA: Diagnosis not present

## 2020-07-10 DIAGNOSIS — R41841 Cognitive communication deficit: Secondary | ICD-10-CM | POA: Diagnosis not present

## 2020-07-10 DIAGNOSIS — M2550 Pain in unspecified joint: Secondary | ICD-10-CM | POA: Diagnosis not present

## 2020-07-10 DIAGNOSIS — Z20828 Contact with and (suspected) exposure to other viral communicable diseases: Secondary | ICD-10-CM | POA: Diagnosis not present

## 2020-07-10 DIAGNOSIS — R2689 Other abnormalities of gait and mobility: Secondary | ICD-10-CM | POA: Diagnosis not present

## 2020-07-10 DIAGNOSIS — G20 Parkinson's disease: Secondary | ICD-10-CM | POA: Diagnosis not present

## 2020-07-10 DIAGNOSIS — M6281 Muscle weakness (generalized): Secondary | ICD-10-CM | POA: Diagnosis not present

## 2020-07-10 DIAGNOSIS — E1100 Type 2 diabetes mellitus with hyperosmolarity without nonketotic hyperglycemic-hyperosmolar coma (NKHHC): Secondary | ICD-10-CM | POA: Diagnosis not present

## 2020-07-10 DIAGNOSIS — Z7401 Bed confinement status: Secondary | ICD-10-CM | POA: Diagnosis not present

## 2020-07-10 DIAGNOSIS — I10 Essential (primary) hypertension: Secondary | ICD-10-CM | POA: Diagnosis not present

## 2020-07-10 DIAGNOSIS — R5381 Other malaise: Secondary | ICD-10-CM | POA: Diagnosis not present

## 2020-07-10 DIAGNOSIS — T796XXA Traumatic ischemia of muscle, initial encounter: Secondary | ICD-10-CM | POA: Diagnosis not present

## 2020-07-10 DIAGNOSIS — E079 Disorder of thyroid, unspecified: Secondary | ICD-10-CM | POA: Diagnosis not present

## 2020-07-10 DIAGNOSIS — R269 Unspecified abnormalities of gait and mobility: Secondary | ICD-10-CM | POA: Diagnosis not present

## 2020-07-10 DIAGNOSIS — R1312 Dysphagia, oropharyngeal phase: Secondary | ICD-10-CM | POA: Diagnosis not present

## 2020-07-10 LAB — GLUCOSE, CAPILLARY
Glucose-Capillary: 108 mg/dL — ABNORMAL HIGH (ref 70–99)
Glucose-Capillary: 126 mg/dL — ABNORMAL HIGH (ref 70–99)
Glucose-Capillary: 121 mg/dL — ABNORMAL HIGH (ref 70–99)

## 2020-07-10 MED ORDER — SIMVASTATIN 20 MG PO TABS
20.0000 mg | ORAL_TABLET | Freq: Every day | ORAL | Status: DC
Start: 2020-07-16 — End: 2020-07-27

## 2020-07-10 MED ORDER — CARBIDOPA-LEVODOPA 25-100 MG PO TABS: 1.0000 | ORAL_TABLET | Freq: Four times a day (QID) | ORAL | Status: DC

## 2020-07-10 MED ORDER — ACETAMINOPHEN 325 MG PO TABS: 650.0000 mg | ORAL_TABLET | Freq: Four times a day (QID) | ORAL | Status: AC | PRN

## 2020-07-10 NOTE — Social Work (Signed)
Clinical Social Worker facilitated patient discharge including contacting patient family and facility to confirm patient discharge plans.  Clinical information faxed to facility and family agreeable with plan.  CSW arranged ambulance transport via [facility] to [facility] RN to call [phone]  with report prior to discharge.  Clinical Social Worker will sign off for now as social work intervention is no longer needed. Please consult us again if new need arises.  Gudrun Axe, MSW, LCSW Clinical Social Worker   

## 2020-07-10 NOTE — Discharge Summary (Signed)
Physician Discharge Summary   Patient ID: DALEENA ROTTER MRN: [id number] DOB/AGE: [birth year] 74 y.o.  Admit date: 07/06/2020 Discharge date: 07/10/2020  Primary Care Physician:  [name], NP   Recommendations for Outpatient Follow-up:  1. Follow up with PCP in 1-2 weeks 2. Outpatient follow-up with Dr. Lurena Joiner Tat, neurology for follow-up.  Sinemet increased to 1 tab p.o. 4 times daily 3. Aspiration precautions, fall precautions 4. Statin currently held due to rhabdomyolysis, may resume on 10/4  Home Health: Patient discharging to skilled nursing facility Equipment/Devices:   Discharge Condition: stable  CODE STATUS: FULL  Diet recommendation:    Discharge Diagnoses:    . AKI (acute kidney injury) (HCC) . Traumatic rhabdomyolysis (HCC) . PAF (paroxysmal atrial fibrillation) (HCC) . HTN (hypertension) . Parkinson's disease (HCC) Generalized debility  Consults: Neurology    Allergies:  No Known Allergies   DISCHARGE MEDICATIONS: Allergies as of 07/10/2020   No Known Allergies     Medication List    TAKE these medications   acetaminophen 325 MG tablet Commonly known as: TYLENOL Take 2 tablets (650 mg total) by mouth every 6 (six) hours as needed for mild pain (or Fever >/= 101).   allopurinol 300 MG tablet Commonly known as: ZYLOPRIM Take 300 mg by mouth daily.   carbidopa-levodopa 25-100 MG tablet Commonly known as: SINEMET IR Take 1 tablet by mouth 4 (four) times daily. What changed: when to take this   Eliquis 5 MG Tabs tablet Generic drug: apixaban Take 5 mg by mouth 2 (two) times daily.   glipiZIDE 2.5 MG 24 hr tablet Commonly known as: GLUCOTROL XL Take 2.5 mg by mouth daily.   simvastatin 20 MG tablet Commonly known as: ZOCOR Take 1 tablet (20 mg total) by mouth at bedtime. Start taking on: July 16, 2020 What changed: These instructions start on July 16, 2020. If you are unsure what to do until then, ask your doctor or other care  provider.        Brief H and P: For complete details please refer to admission H and P, but in brief Patient is a 74 year old female with history of paroxysmal A. fib on Eliquis, diabetes mellitus type 2, hypertension, gout, Parkinson's disease, presented after fall at home.  Patient reported 2 falls in the last week.  On the day of admission had a fall off of the toilet, was on the floor for a while.  Patient's husband has dementia and was unable to help the patient get up.  Patient was brought to ED by EMS.  No loss of consciousness with fall, did hit her head On admission CT head negative, CPK 6270, AKI with creatinine 1.5.  UA negative for UTI  Hospital Course:      Traumatic rhabdomyolysis (HCC) secondary to mechanical fall in the setting of Parkinson's disease -CK 6270 at the time of admission with AKI -Creatinine has improved to 0.85, CK improving 1516 on 9/27    AKI (acute kidney injury) (HCC) -Baseline creatinine 1.0, presented with creatinine of 1.5 secondary to #1 Patient was placed on IV fluid hydration, creatinine improved, 0.8 at the time of discharge     PAF (paroxysmal atrial fibrillation) (HCC)  -On Eliquis.  Heart rate controlled -Discussed in detail with the patient regarding anticoagulation given recent falls and high risk of bleeding.  Patient wants to continue Eliquis at this time.    DM2 (diabetes mellitus, type 2) (HCC) -CBG stable, continue glipizide    HTN (hypertension) -BP currently stable  Parkinson's disease (HCC) -Currently on Sinemet, frequent falls, tremors worsened in last week, neurology was consulted -Recommended increasing Sinemet to 1 tab p.o. 4 times daily, PT OT  -Patient follows Dr. Lurena Joiner Tat, recommend outpatient neurology follow-up in 2 weeks to assess improvement symptomatically. -PT evaluation recommending SNF  Hyperlipidemia Given elevated CPK, LFTs, hold simvastatin for this week  Transaminitis Elevated AST, ALT,  likely due to #1, currently no abdominal pain -LFTs are improving  Obesity Estimated body mass index is 30.9 kg/m as calculated from the following:   Height as of this encounter:  (1.626 m).   Weight as of this encounter: 81.6 kg.   Day of Discharge S: No acute complaints, feels tremors are slightly better from the time of admission.  No acute issues overnight, no fevers or chills.  Tolerating diet  BP (!) 141/67 (BP Location: Right Arm)   Pulse (!) 56   Temp 98.2 F (36.8 C) (Oral)   Resp 17   Ht  (1.626 m)   Wt 81.6 kg   SpO2 97%   BMI 30.90 kg/m   Physical Exam: General: Alert and awake oriented x3 not in any acute distress. HEENT: anicteric sclera, pupils reactive to light and accommodation CVS: S1-S2 clear no murmur rubs or gallops Chest: clear to auscultation bilaterally, no wheezing rales or rhonchi Abdomen: soft nontender, nondistended, normal bowel sounds Extremities: no cyanosis, clubbing or edema noted bilaterally Neuro: resting tremors, no acute neurological deficits   Get Medicines reviewed and adjusted: Please take all your medications with you for your next visit with your Primary MD  Please request your Primary MD to go over all hospital tests and procedure/radiological results at the follow up. Please ask your Primary MD to get all Hospital records sent to his/her office.  If you experience worsening of your admission symptoms, develop shortness of breath, life threatening emergency, suicidal or homicidal thoughts you must seek medical attention immediately by calling 911 or calling your MD immediately  if symptoms less severe.  You must read complete instructions/literature along with all the possible adverse reactions/side effects for all the Medicines you take and that have been prescribed to you. Take any new Medicines after you have completely understood and accept all the possible adverse reactions/side effects.   Do not drive when taking  pain medications.   Do not take more than prescribed Pain, Sleep and Anxiety Medications  Special Instructions: If you have smoked or chewed Tobacco  in the last 2 yrs please stop smoking, stop any regular Alcohol  and or any Recreational drug use.  Wear Seat belts while driving.  Please note  You were cared for by a hospitalist during your hospital stay. Once you are discharged, your primary care physician will handle any further medical issues. Please note that NO REFILLS for any discharge medications will be authorized once you are discharged, as it is imperative that you return to your primary care physician (or establish a relationship with a primary care physician if you do not have one) for your aftercare needs so that they can reassess your need for medications and monitor your lab values.   The results of significant diagnostics from this hospitalization (including imaging, microbiology, ancillary and laboratory) are listed below for reference.      Procedures/Studies:  CT HEAD WO CONTRAST  Result Date: 07/06/2020 CLINICAL DATA:  Head trauma, minor. EXAM: CT HEAD WITHOUT CONTRAST CT CERVICAL SPINE WITHOUT CONTRAST TECHNIQUE: Multidetector CT imaging of the head and cervical  spine was performed following the standard protocol without intravenous contrast. Multiplanar CT image reconstructions of the cervical spine were also generated. COMPARISON:  CT head/cervical spine 07/18/2018. FINDINGS: CT HEAD FINDINGS Brain: Stable, moderate generalized cerebral atrophy. Stable, mild ill-defined hypoattenuation within the cerebral white matter which is nonspecific, but likely reflects sequela of chronic small vessel ischemic disease. There is no acute intracranial hemorrhage. No demarcated cortical infarct. No extra-axial fluid collection. No evidence of intracranial mass. No midline shift. Vascular: No hyperdense vessel.  Atherosclerotic calcifications. Skull: Normal. Negative for fracture or  focal lesion. Sinuses/Orbits: Visualized orbits show no acute finding. Mild ethmoid sinus mucosal thickening. Frothy secretions within the left frontal sinus. No significant mastoid effusion. CT CERVICAL SPINE FINDINGS Alignment: A mild cervical levocurvature may be positional. Trace C4-C5 grade 1 anterolisthesis Skull base and vertebrae: The basion-dental and atlanto-dental intervals are maintained.No evidence of acute fracture to the cervical spine. 4 mm sclerotic focus within the left C4 articular pillar, nonspecific but possibly reflecting a bone island. Soft tissues and spinal canal: No prevertebral fluid or swelling. No visible canal hematoma. Disc levels: Cervical spondylosis. Most notably at C5-C6 and C6-C7 there is moderate disc degeneration with posterior disc osteophytes and uncovertebral hypertrophy. The C5-C6 posterior disc osteophyte contributes to moderate/severe spinal canal stenosis. Moderate/severe spinal canal stenosis at these levels with possible spinal cord mass effect. Upper chest: No consolidation within the imaged lung apices. No visible pneumothorax Other: Multiple thyroid nodules, the largest arising from the right lobe measuring 2.2 cm IMPRESSION: CT head: 1. No evidence of acute intracranial abnormality. 2. Stable, moderate generalized cerebral atrophy. 3. Stable, mild cerebral white matter chronic small vessel ischemic disease. 4. Left frontal sinusitis.  Mild ethmoid sinus mucosal thickening. CT cervical spine: 1. No evidence of acute fracture to the cervical spine. 2. Minimal C4-C5 grade 1 anterolisthesis. 3. Cervical spondylosis greatest at C5-C6 and C6-C7 where prominent posterior disc osteophyte complexes contribute to multifactorial moderate/severe spinal canal stenosis with possible spinal cord mass effect. 4. Multiple thyroid nodules, the largest within the right lobe measuring 2.2 cm. Nonemergent thyroid ultrasound is recommended for further evaluation. Electronically Signed    By: Jackey Loge DO   On: 07/06/2020 18:28   CT Cervical Spine Wo Contrast  Result Date: 07/06/2020 CLINICAL DATA:  Head trauma, minor. EXAM: CT HEAD WITHOUT CONTRAST CT CERVICAL SPINE WITHOUT CONTRAST TECHNIQUE: Multidetector CT imaging of the head and cervical spine was performed following the standard protocol without intravenous contrast. Multiplanar CT image reconstructions of the cervical spine were also generated. COMPARISON:  CT head/cervical spine 07/18/2018. FINDINGS: CT HEAD FINDINGS Brain: Stable, moderate generalized cerebral atrophy. Stable, mild ill-defined hypoattenuation within the cerebral white matter which is nonspecific, but likely reflects sequela of chronic small vessel ischemic disease. There is no acute intracranial hemorrhage. No demarcated cortical infarct. No extra-axial fluid collection. No evidence of intracranial mass. No midline shift. Vascular: No hyperdense vessel.  Atherosclerotic calcifications. Skull: Normal. Negative for fracture or focal lesion. Sinuses/Orbits: Visualized orbits show no acute finding. Mild ethmoid sinus mucosal thickening. Frothy secretions within the left frontal sinus. No significant mastoid effusion. CT CERVICAL SPINE FINDINGS Alignment: A mild cervical levocurvature may be positional. Trace C4-C5 grade 1 anterolisthesis Skull base and vertebrae: The basion-dental and atlanto-dental intervals are maintained.No evidence of acute fracture to the cervical spine. 4 mm sclerotic focus within the left C4 articular pillar, nonspecific but possibly reflecting a bone island. Soft tissues and spinal canal: No prevertebral fluid or swelling. No visible canal hematoma.  Disc levels: Cervical spondylosis. Most notably at C5-C6 and C6-C7 there is moderate disc degeneration with posterior disc osteophytes and uncovertebral hypertrophy. The C5-C6 posterior disc osteophyte contributes to moderate/severe spinal canal stenosis. Moderate/severe spinal canal stenosis at these  levels with possible spinal cord mass effect. Upper chest: No consolidation within the imaged lung apices. No visible pneumothorax Other: Multiple thyroid nodules, the largest arising from the right lobe measuring 2.2 cm IMPRESSION: CT head: 1. No evidence of acute intracranial abnormality. 2. Stable, moderate generalized cerebral atrophy. 3. Stable, mild cerebral white matter chronic small vessel ischemic disease. 4. Left frontal sinusitis.  Mild ethmoid sinus mucosal thickening. CT cervical spine: 1. No evidence of acute fracture to the cervical spine. 2. Minimal C4-C5 grade 1 anterolisthesis. 3. Cervical spondylosis greatest at C5-C6 and C6-C7 where prominent posterior disc osteophyte complexes contribute to multifactorial moderate/severe spinal canal stenosis with possible spinal cord mass effect. 4. Multiple thyroid nodules, the largest within the right lobe measuring 2.2 cm. Nonemergent thyroid ultrasound is recommended for further evaluation. Electronically Signed   By: Jackey LogeKyle  Golden DO   On: 07/06/2020 18:28   DG Chest Portable 1 View  Result Date: 07/06/2020 CLINICAL DATA:  Weakness EXAM: PORTABLE CHEST 1 VIEW COMPARISON:  None. FINDINGS: Single frontal view of the chest demonstrates an unremarkable cardiac silhouette. No airspace disease, effusion, or pneumothorax. No acute bony abnormality. IMPRESSION: 1. No acute intrathoracic process. Electronically Signed   By: Sharlet SalinaMichael  Brown M.D.   On: 07/06/2020 21:49       LAB RESULTS: Basic Metabolic Panel: Recent Labs  Lab 07/08/20 0538 07/09/20 0126  NA 141 140  K 4.5 4.0  CL 115* 108  CO2 19* 22  GLUCOSE 81 96  BUN 25* 19  CREATININE 0.95 0.85  CALCIUM 7.8* 8.4*   Liver Function Tests: Recent Labs  Lab 07/07/20 0348 07/08/20 0538  AST 115* 119*  ALT 41 <5  ALKPHOS 48 40  BILITOT 0.7 1.4*  PROT 5.1* 4.7*  ALBUMIN 2.7* 2.4*   No results for input(s): LIPASE, AMYLASE in the last 168 hours. No results for input(s): AMMONIA in the  last 168 hours. CBC: Recent Labs  Lab 07/06/20 2003 07/06/20 2003 07/07/20 0926 07/07/20 0926 07/08/20 0538  WBC 14.5*   < > 8.2  --  5.4  NEUTROABS 12.4*  --   --   --   --   HGB 12.4*   < > 10.2*  --  9.5*  HCT 38.8*   < > 32.1*  --  29.7*  MCV 86.4   < > 87.0   < > 88.7  PLT 208   < > 158  --  146*   < > = values in this interval not displayed.   Cardiac Enzymes: Recent Labs  Lab 07/08/20 0538 07/09/20 0126  CKTOTAL 2,715* 1,516*   BNP: Invalid input(s): POCBNP CBG: Recent Labs  Lab 07/09/20 2144 07/10/20 0758  GLUCAP 164* 108*       Disposition and Follow-up: Discharge Instructions    Diet Carb Modified   Complete by: As directed    Increase activity slowly   Complete by: As directed        DISPOSITION: Skilled nursing facility   DISCHARGE FOLLOW-UP  Follow-up Information    Tat, Octaviano BattyRebecca S, DO. Schedule an appointment as soon as possible for a visit in 2 week(s).   Specialty: Neurology Contact information: [address]  [address] [place] [address] [phone]  [name], NP. Schedule an appointment as soon as possible for a visit in 2 week(s).   Specialty: Family Medicine Contact information: [address] [place] [address] [phone]                Time coordinating discharge:  35 minutes  Signed:   Thad Ranger M.D. [facility] 07/10/2020, 11:20 AM

## 2020-07-10 NOTE — Progress Notes (Signed)
Report given to Shanda Bumps at [facility]

## 2020-07-10 NOTE — TOC Progression Note (Addendum)
Transition of Care [facility]) - Progression Note    Patient Details  Name: Sherri Pratt MRN: [id number] Date of Birth: [birth year]  Transition of Care [facility]) CM/SW [place], [place] Phone Number: 07/10/2020, 9:50 AM  Clinical Narrative:   11:32am- CSW f/u with pt son Joey to see if he has decision or any further questions.    9:50am- Met with pt at bedside. Introduced self, role, reason for visit. Pt states she chose a facility- but then upon calling her son it became clear that they had not reviewed the list and had chosen [facility] which is not a SNF. CSW spoke with pt and son Joey via telephone. CSW received verbal permission from pt and son to text the CMS list to him to review their offers. Pt and son are aware pt is cleared for discharge and that we need a decision.   TOC team to assist with d/c today.    Expected Discharge Plan: [place] Barriers to Discharge: Continued Medical Work up, SNF Pending bed offer  Expected Discharge Plan and Services Expected Discharge Plan: [place] [employer] Choice: [place] arrangements for the past 2 months: Single Family Home Expected Discharge Date: 07/10/20                Readmission Risk Interventions No flowsheet data found.

## 2020-07-10 NOTE — Progress Notes (Signed)
Sherri Pratt for report and they took my phone number to return my call if nurse is available.

## 2020-07-10 NOTE — Telephone Encounter (Signed)
Please see how she is doing. If she has symptoms we can set up virtual visit first

## 2020-07-10 NOTE — Plan of Care (Signed)
  Problem: Education: Goal: Knowledge of General Education information will improve Description Including pain rating scale, medication(s)/side effects and non-pharmacologic comfort measures Outcome: Progressing   Problem: Clinical Measurements: Goal: Ability to maintain clinical measurements within normal limits will improve Outcome: Progressing   Problem: Activity: Goal: Risk for activity intolerance will decrease Outcome: Progressing   

## 2020-07-13 DIAGNOSIS — G20 Parkinson's disease: Secondary | ICD-10-CM | POA: Diagnosis not present

## 2020-07-13 DIAGNOSIS — N179 Acute kidney failure, unspecified: Secondary | ICD-10-CM | POA: Diagnosis not present

## 2020-07-13 DIAGNOSIS — R269 Unspecified abnormalities of gait and mobility: Secondary | ICD-10-CM | POA: Diagnosis not present

## 2020-07-13 DIAGNOSIS — M6282 Rhabdomyolysis: Secondary | ICD-10-CM | POA: Diagnosis not present

## 2020-07-16 NOTE — Telephone Encounter (Signed)
Left message on machine for patient to return our call 

## 2020-07-18 ENCOUNTER — Ambulatory Visit: Payer: Medicare Other | Admitting: Gastroenterology

## 2020-07-18 NOTE — Telephone Encounter (Signed)
Left message on machine for patient to return our call 

## 2020-07-25 ENCOUNTER — Telehealth: Payer: Self-pay | Admitting: Adult Health

## 2020-07-25 NOTE — Telephone Encounter (Signed)
Okay for verbal 

## 2020-07-25 NOTE — Telephone Encounter (Signed)
Sherri Pratt  [facility]  [phone]  Sherri Pratt needs verbal orders for Nursing, PT, OT, and Home Health Aid  Please advise

## 2020-07-26 NOTE — Telephone Encounter (Signed)
Ok for verbal orders,   Thank you

## 2020-07-27 ENCOUNTER — Inpatient Hospital Stay (HOSPITAL_COMMUNITY)
Admission: EM | Admit: 2020-07-27 | Discharge: 2020-08-02 | DRG: 565 | Disposition: A | Discharge status: Skilled Nursing Facility | Payer: Medicare Other | Attending: Internal Medicine | Admitting: Internal Medicine

## 2020-07-27 ENCOUNTER — Emergency Department (HOSPITAL_COMMUNITY): Payer: Medicare Other

## 2020-07-27 ENCOUNTER — Other Ambulatory Visit: Payer: Self-pay

## 2020-07-27 ENCOUNTER — Encounter (HOSPITAL_COMMUNITY): Payer: Self-pay | Admitting: Pharmacy Technician

## 2020-07-27 ENCOUNTER — Telehealth: Payer: Self-pay | Admitting: Adult Health

## 2020-07-27 DIAGNOSIS — E6609 Other obesity due to excess calories: Secondary | ICD-10-CM | POA: Diagnosis not present

## 2020-07-27 DIAGNOSIS — R2681 Unsteadiness on feet: Secondary | ICD-10-CM | POA: Diagnosis not present

## 2020-07-27 DIAGNOSIS — N189 Chronic kidney disease, unspecified: Secondary | ICD-10-CM | POA: Diagnosis present

## 2020-07-27 DIAGNOSIS — M6281 Muscle weakness (generalized): Secondary | ICD-10-CM | POA: Diagnosis not present

## 2020-07-27 DIAGNOSIS — Z7901 Long term (current) use of anticoagulants: Secondary | ICD-10-CM

## 2020-07-27 DIAGNOSIS — M793 Panniculitis, unspecified: Secondary | ICD-10-CM | POA: Diagnosis present

## 2020-07-27 DIAGNOSIS — M79603 Pain in arm, unspecified: Secondary | ICD-10-CM | POA: Diagnosis not present

## 2020-07-27 DIAGNOSIS — G319 Degenerative disease of nervous system, unspecified: Secondary | ICD-10-CM | POA: Diagnosis not present

## 2020-07-27 DIAGNOSIS — G20 Parkinson's disease: Secondary | ICD-10-CM | POA: Diagnosis not present

## 2020-07-27 DIAGNOSIS — Z20822 Contact with and (suspected) exposure to covid-19: Secondary | ICD-10-CM | POA: Diagnosis present

## 2020-07-27 DIAGNOSIS — W19XXXA Unspecified fall, initial encounter: Secondary | ICD-10-CM

## 2020-07-27 DIAGNOSIS — I493 Ventricular premature depolarization: Secondary | ICD-10-CM | POA: Diagnosis not present

## 2020-07-27 DIAGNOSIS — L89152 Pressure ulcer of sacral region, stage 2: Secondary | ICD-10-CM | POA: Diagnosis present

## 2020-07-27 DIAGNOSIS — M2550 Pain in unspecified joint: Secondary | ICD-10-CM | POA: Diagnosis not present

## 2020-07-27 DIAGNOSIS — E1122 Type 2 diabetes mellitus with diabetic chronic kidney disease: Secondary | ICD-10-CM | POA: Diagnosis present

## 2020-07-27 DIAGNOSIS — S0990XA Unspecified injury of head, initial encounter: Secondary | ICD-10-CM | POA: Diagnosis not present

## 2020-07-27 DIAGNOSIS — Z515 Encounter for palliative care: Secondary | ICD-10-CM

## 2020-07-27 DIAGNOSIS — R52 Pain, unspecified: Secondary | ICD-10-CM | POA: Diagnosis not present

## 2020-07-27 DIAGNOSIS — Z8701 Personal history of pneumonia (recurrent): Secondary | ICD-10-CM

## 2020-07-27 DIAGNOSIS — R296 Repeated falls: Secondary | ICD-10-CM | POA: Diagnosis present

## 2020-07-27 DIAGNOSIS — E782 Mixed hyperlipidemia: Secondary | ICD-10-CM | POA: Diagnosis present

## 2020-07-27 DIAGNOSIS — Z79899 Other long term (current) drug therapy: Secondary | ICD-10-CM

## 2020-07-27 DIAGNOSIS — I480 Paroxysmal atrial fibrillation: Secondary | ICD-10-CM | POA: Diagnosis present

## 2020-07-27 DIAGNOSIS — M109 Gout, unspecified: Secondary | ICD-10-CM | POA: Diagnosis not present

## 2020-07-27 DIAGNOSIS — M6282 Rhabdomyolysis: Secondary | ICD-10-CM | POA: Diagnosis not present

## 2020-07-27 DIAGNOSIS — E119 Type 2 diabetes mellitus without complications: Secondary | ICD-10-CM

## 2020-07-27 DIAGNOSIS — Z6830 Body mass index (BMI) 30.0-30.9, adult: Secondary | ICD-10-CM | POA: Diagnosis not present

## 2020-07-27 DIAGNOSIS — Z794 Long term (current) use of insulin: Secondary | ICD-10-CM | POA: Diagnosis not present

## 2020-07-27 DIAGNOSIS — G20A1 Parkinson's disease without dyskinesia, without mention of fluctuations: Secondary | ICD-10-CM | POA: Diagnosis present

## 2020-07-27 DIAGNOSIS — Z91010 Allergy to peanuts: Secondary | ICD-10-CM

## 2020-07-27 DIAGNOSIS — T796XXA Traumatic ischemia of muscle, initial encounter: Secondary | ICD-10-CM | POA: Diagnosis not present

## 2020-07-27 DIAGNOSIS — R5381 Other malaise: Secondary | ICD-10-CM | POA: Diagnosis not present

## 2020-07-27 DIAGNOSIS — I10 Essential (primary) hypertension: Secondary | ICD-10-CM | POA: Diagnosis present

## 2020-07-27 DIAGNOSIS — E872 Acidosis: Secondary | ICD-10-CM | POA: Diagnosis present

## 2020-07-27 DIAGNOSIS — G9341 Metabolic encephalopathy: Secondary | ICD-10-CM | POA: Diagnosis not present

## 2020-07-27 DIAGNOSIS — E869 Volume depletion, unspecified: Secondary | ICD-10-CM | POA: Diagnosis not present

## 2020-07-27 DIAGNOSIS — R2689 Other abnormalities of gait and mobility: Secondary | ICD-10-CM | POA: Diagnosis not present

## 2020-07-27 DIAGNOSIS — Z9181 History of falling: Secondary | ICD-10-CM

## 2020-07-27 DIAGNOSIS — Z7401 Bed confinement status: Secondary | ICD-10-CM | POA: Diagnosis not present

## 2020-07-27 DIAGNOSIS — Z66 Do not resuscitate: Secondary | ICD-10-CM | POA: Diagnosis present

## 2020-07-27 DIAGNOSIS — N179 Acute kidney failure, unspecified: Secondary | ICD-10-CM | POA: Diagnosis not present

## 2020-07-27 DIAGNOSIS — I6782 Cerebral ischemia: Secondary | ICD-10-CM | POA: Diagnosis not present

## 2020-07-27 DIAGNOSIS — E11649 Type 2 diabetes mellitus with hypoglycemia without coma: Secondary | ICD-10-CM | POA: Diagnosis not present

## 2020-07-27 DIAGNOSIS — M79621 Pain in right upper arm: Secondary | ICD-10-CM | POA: Diagnosis not present

## 2020-07-27 DIAGNOSIS — Z7189 Other specified counseling: Secondary | ICD-10-CM | POA: Diagnosis not present

## 2020-07-27 DIAGNOSIS — B372 Candidiasis of skin and nail: Secondary | ICD-10-CM | POA: Diagnosis not present

## 2020-07-27 DIAGNOSIS — E1100 Type 2 diabetes mellitus with hyperosmolarity without nonketotic hyperglycemic-hyperosmolar coma (NKHHC): Secondary | ICD-10-CM | POA: Diagnosis not present

## 2020-07-27 DIAGNOSIS — G909 Disorder of the autonomic nervous system, unspecified: Secondary | ICD-10-CM | POA: Diagnosis not present

## 2020-07-27 DIAGNOSIS — Z7984 Long term (current) use of oral hypoglycemic drugs: Secondary | ICD-10-CM

## 2020-07-27 DIAGNOSIS — I130 Hypertensive heart and chronic kidney disease with heart failure and stage 1 through stage 4 chronic kidney disease, or unspecified chronic kidney disease: Secondary | ICD-10-CM | POA: Diagnosis present

## 2020-07-27 LAB — URINALYSIS, ROUTINE W REFLEX MICROSCOPIC
Bilirubin Urine: NEGATIVE
Glucose, UA: 50 mg/dL — AB
Hgb urine dipstick: NEGATIVE
Ketones, ur: 5 mg/dL — AB
Leukocytes,Ua: NEGATIVE
Nitrite: NEGATIVE
Protein, ur: NEGATIVE mg/dL
Specific Gravity, Urine: 1.016 (ref 1.005–1.030)
pH: 6 (ref 5.0–8.0)

## 2020-07-27 LAB — CBC WITH DIFFERENTIAL/PLATELET
Abs Immature Granulocytes: 0.05 10*3/uL (ref 0.00–0.07)
Basophils Absolute: 0 10*3/uL (ref 0.0–0.1)
Basophils Relative: 0 %
Eosinophils Absolute: 0 10*3/uL (ref 0.0–0.5)
Eosinophils Relative: 0 %
HCT: 40.3 % (ref 36.0–46.0)
Hemoglobin: 12.6 g/dL (ref 12.0–15.0)
Immature Granulocytes: 0 %
Lymphocytes Relative: 7 %
Lymphs Abs: 0.8 10*3/uL (ref 0.7–4.0)
MCH: 27.2 pg (ref 26.0–34.0)
MCHC: 31.3 g/dL (ref 30.0–36.0)
MCV: 86.9 fL (ref 80.0–100.0)
Monocytes Absolute: 0.8 10*3/uL (ref 0.1–1.0)
Monocytes Relative: 7 %
Neutro Abs: 9.9 10*3/uL — ABNORMAL HIGH (ref 1.7–7.7)
Neutrophils Relative %: 86 %
Platelets: 228 10*3/uL (ref 150–400)
RBC: 4.64 MIL/uL (ref 3.87–5.11)
RDW: 15 % (ref 11.5–15.5)
WBC: 11.5 10*3/uL — ABNORMAL HIGH (ref 4.0–10.5)
nRBC: 0 % (ref 0.0–0.2)

## 2020-07-27 LAB — PROTIME-INR
INR: 1 (ref 0.8–1.2)
Prothrombin Time: 13.1 seconds (ref 11.4–15.2)

## 2020-07-27 LAB — COMPREHENSIVE METABOLIC PANEL
ALT: 25 U/L (ref 0–44)
AST: 43 U/L — ABNORMAL HIGH (ref 15–41)
Albumin: 3.5 g/dL (ref 3.5–5.0)
Alkaline Phosphatase: 69 U/L (ref 38–126)
Anion gap: 10 (ref 5–15)
BUN: 17 mg/dL (ref 8–23)
CO2: 25 mmol/L (ref 22–32)
Calcium: 9.4 mg/dL (ref 8.9–10.3)
Chloride: 103 mmol/L (ref 98–111)
Creatinine, Ser: 1.03 mg/dL — ABNORMAL HIGH (ref 0.44–1.00)
GFR, Estimated: 53 mL/min — ABNORMAL LOW (ref >60)
Glucose, Bld: 157 mg/dL — ABNORMAL HIGH (ref 70–99)
Potassium: 4.2 mmol/L (ref 3.5–5.1)
Sodium: 138 mmol/L (ref 135–145)
Total Bilirubin: 0.8 mg/dL (ref 0.3–1.2)
Total Protein: 6.7 g/dL (ref 6.5–8.1)

## 2020-07-27 LAB — BRAIN NATRIURETIC PEPTIDE: B Natriuretic Peptide: 297.7 pg/mL — ABNORMAL HIGH (ref 0.0–100.0)

## 2020-07-27 LAB — RESPIRATORY PANEL BY RT PCR (FLU A&B, COVID)
Influenza A by PCR: NEGATIVE
Influenza B by PCR: NEGATIVE
SARS Coronavirus 2 by RT PCR: NEGATIVE

## 2020-07-27 LAB — CK: Total CK: 1417 U/L — ABNORMAL HIGH (ref 38–234)

## 2020-07-27 LAB — LACTIC ACID, PLASMA
Lactic Acid, Venous: 2.6 mmol/L (ref 0.5–1.9)
Lactic Acid, Venous: 2 mmol/L (ref 0.5–1.9)

## 2020-07-27 LAB — TROPONIN I (HIGH SENSITIVITY)
Troponin I (High Sensitivity): 19 ng/L — ABNORMAL HIGH (ref <18)
Troponin I (High Sensitivity): 18 ng/L — ABNORMAL HIGH (ref <18)

## 2020-07-27 LAB — GLUCOSE, CAPILLARY: Glucose-Capillary: 145 mg/dL — ABNORMAL HIGH (ref 70–99)

## 2020-07-27 MED ORDER — INSULIN ASPART 100 UNIT/ML ~~LOC~~ SOLN
0.0000 [IU] | Freq: Three times a day (TID) | SUBCUTANEOUS | Status: DC
  Administered 2020-07-29: 1 [IU] via SUBCUTANEOUS
  Administered 2020-07-29: 2 [IU] via SUBCUTANEOUS
  Administered 2020-07-30 – 2020-07-31 (×3): 1 [IU] via SUBCUTANEOUS
  Administered 2020-07-31: 2 [IU] via SUBCUTANEOUS
  Administered 2020-08-01: 1 [IU] via SUBCUTANEOUS
  Administered 2020-08-01 (×2): 2 [IU] via SUBCUTANEOUS
  Administered 2020-08-02: 3 [IU] via SUBCUTANEOUS
  Administered 2020-08-02: 2 [IU] via SUBCUTANEOUS

## 2020-07-27 MED ORDER — ALLOPURINOL 300 MG PO TABS
300.0000 mg | ORAL_TABLET | Freq: Every day | ORAL | Status: DC
  Administered 2020-07-28 – 2020-08-02 (×6): 300 mg via ORAL
  Filled 2020-07-27 (×7): qty 1

## 2020-07-27 MED ORDER — APIXABAN 5 MG PO TABS
5.0000 mg | ORAL_TABLET | Freq: Two times a day (BID) | ORAL | Status: DC
  Administered 2020-07-27 – 2020-08-02 (×12): 5 mg via ORAL
  Filled 2020-07-27 (×13): qty 1

## 2020-07-27 MED ORDER — ONDANSETRON HCL 4 MG/2ML IJ SOLN: 4.0000 mg | Freq: Four times a day (QID) | INTRAMUSCULAR | Status: DC | PRN

## 2020-07-27 MED ORDER — FLUCONAZOLE 100MG IVPB
100.0000 mg | INTRAVENOUS | Status: DC
  Administered 2020-07-27 – 2020-07-29 (×3): 100 mg via INTRAVENOUS
  Filled 2020-07-27 (×4): qty 50

## 2020-07-27 MED ORDER — HYDROCODONE-ACETAMINOPHEN 5-325 MG PO TABS: 1.0000 | ORAL_TABLET | ORAL | Status: DC | PRN

## 2020-07-27 MED ORDER — CARBIDOPA-LEVODOPA 25-100 MG PO TABS
2.0000 | ORAL_TABLET | Freq: Two times a day (BID) | ORAL | Status: DC
  Administered 2020-07-28 – 2020-08-02 (×11): 2 via ORAL
  Filled 2020-07-27 (×13): qty 2

## 2020-07-27 MED ORDER — ACETAMINOPHEN 650 MG RE SUPP: 650.0000 mg | Freq: Four times a day (QID) | RECTAL | Status: DC | PRN

## 2020-07-27 MED ORDER — POLYETHYLENE GLYCOL 3350 17 G PO PACK: 17.0000 g | PACK | Freq: Every day | ORAL | Status: DC | PRN

## 2020-07-27 MED ORDER — CARBIDOPA-LEVODOPA 25-100 MG PO TABS
1.0000 | ORAL_TABLET | Freq: Every day | ORAL | Status: DC
  Administered 2020-07-27 – 2020-08-01 (×6): 1 via ORAL
  Filled 2020-07-27 (×7): qty 1

## 2020-07-27 MED ORDER — LACTATED RINGERS IV BOLUS
250.0000 mL | Freq: Once | INTRAVENOUS | Status: AC
  Administered 2020-07-27: 250 mL via INTRAVENOUS

## 2020-07-27 MED ORDER — ACETAMINOPHEN 325 MG PO TABS
650.0000 mg | ORAL_TABLET | Freq: Four times a day (QID) | ORAL | Status: DC | PRN
  Administered 2020-07-27: 650 mg via ORAL
  Filled 2020-07-27: qty 2

## 2020-07-27 MED ORDER — LACTATED RINGERS IV SOLN: INTRAVENOUS | Status: DC

## 2020-07-27 MED ORDER — METOPROLOL TARTRATE 12.5 MG HALF TABLET
12.5000 mg | ORAL_TABLET | Freq: Two times a day (BID) | ORAL | Status: DC
  Administered 2020-07-28 – 2020-08-02 (×11): 12.5 mg via ORAL
  Filled 2020-07-27 (×12): qty 1

## 2020-07-27 MED ORDER — DOCUSATE SODIUM 100 MG PO CAPS
100.0000 mg | ORAL_CAPSULE | Freq: Two times a day (BID) | ORAL | Status: DC
  Administered 2020-07-27 – 2020-08-02 (×12): 100 mg via ORAL
  Filled 2020-07-27 (×13): qty 1

## 2020-07-27 MED ORDER — HYDRALAZINE HCL 20 MG/ML IJ SOLN: 5.0000 mg | INTRAMUSCULAR | Status: DC | PRN

## 2020-07-27 MED ORDER — BISACODYL 5 MG PO TBEC: 5.0000 mg | DELAYED_RELEASE_TABLET | Freq: Every day | ORAL | Status: DC | PRN

## 2020-07-27 MED ORDER — CARBIDOPA-LEVODOPA 25-100 MG PO TABS: 1.0000 | ORAL_TABLET | ORAL | Status: DC

## 2020-07-27 MED ORDER — SODIUM CHLORIDE 0.9% FLUSH
3.0000 mL | Freq: Two times a day (BID) | INTRAVENOUS | Status: DC
  Administered 2020-07-28 – 2020-08-02 (×10): 3 mL via INTRAVENOUS

## 2020-07-27 MED ORDER — ONDANSETRON HCL 4 MG PO TABS: 4.0000 mg | ORAL_TABLET | Freq: Four times a day (QID) | ORAL | Status: DC | PRN

## 2020-07-27 MED ORDER — MORPHINE SULFATE (PF) 2 MG/ML IV SOLN: 2.0000 mg | INTRAVENOUS | Status: DC | PRN

## 2020-07-27 NOTE — Telephone Encounter (Signed)
Verbal given to Tanya.

## 2020-07-27 NOTE — ED Notes (Signed)
Peri care performed and skin assessment. Linens and chuxs and depends changed. Skin cleaned and dried. Moisture barrier cream applied in copious amounts.

## 2020-07-27 NOTE — Telephone Encounter (Signed)
I already spoke with Kenney Houseman and given the verbal order.

## 2020-07-27 NOTE — H&P (Addendum)
History and Physical    Sherri BiddingJudy C Studstill WUJ:[id number] DOB: 09-25-1946 DOA: 07/27/2020  PCP: Shirline FreesNafziger, Cory, NP Consultants:  Tat - neurology; Croitoru - cardiology Patient coming from:  Home - lives with husband; NOK: Bridget HartshornSon, Jeff Brandes, [phone]  Chief Complaint: fall  HPI: Sherri Pratt is a 74 y.o. female with medical history significant of advanced Parkinson's disease; afib on Eliquis; DM; and HTN presenting for readmission.  She was previously admitted from 9/24-28 for rhabdo and AKI associated with a fall; she was discharged to SNF rehab, sent home 2 days ago, and is presenting with the exact same issue.  She has severe tremor of the LUE and was barely ambulatory with a walker even upon d/c from rehab.  Her son visited last night and she was doing okay but she fell going to the bathroom last night and laid in the floor all night.  She was found down today about 11AM by her other son.  They are unable to provide 24/7 in-home care for the patient and her husband, but he has advancing dementia and "they are a package deal" - so if she needs long-term placement, he does too.  The family may be able to afford short-term 24/7 caregivers, but not for long.  It is a very challenging situation.  She also has a severe yeast panniculitis - the nurse wiped the area and within minutes it was bubbling with yeast again.  Her facility has been using Nystatin cream on it.  She is complaining of R upper arm pain since the fall.    ED Course:  Repeat of last hospitalization.  Mental status is ok but too weak to be successful and husband has advanced dementia.  Unable to get up all night after a fall.  Rhabdo similar to prior last hospitalization.  Review of Systems: As per HPI; otherwise review of systems reviewed and negative.   Ambulatory Status: Minimally ambulatory with a walker currently   Past Medical History:  Diagnosis Date  . Anemia   . Atrial fibrillation (HCC)    chronically on Eliquis  .  Chronic kidney disease   . Diabetes mellitus   . Gout   . Hyperlipidemia   . Hypertension   . Parkinson's disease [facility](HCC)     Past Surgical History:  Procedure Laterality Date  . APPENDECTOMY      Social History   Socioeconomic History  . Marital status: Married    Spouse name: Not on file  . Number of children: Not on file  . Years of education: Not on file  . Highest education level: Not on file  Occupational History  . Not on file  Tobacco Use  . Smoking status: Never Smoker  . Smokeless tobacco: Never Used  Substance and Sexual Activity  . Alcohol use: No  . Drug use: No  . Sexual activity: Not on file  Other Topics Concern  . Not on file  Social History Narrative   Retired - She Forensic psychologistsold insurance   Married for 51 years    Three children - all in [place]      She likes to quilting and shopping.    Social Determinants of Health   Financial Resource Strain:   . Difficulty of Paying Living Expenses: Not on file  Food Insecurity:   . Worried About Programme researcher, broadcasting/film/videounning Out of Food in the Last Year: Not on file  . Ran Out of Food in the Last Year: Not on file  Transportation Needs:   .  Lack of Transportation (Medical): Not on file  . Lack of Transportation (Non-Medical): Not on file  Physical Activity:   . Days of Exercise per Week: Not on file  . Minutes of Exercise per Session: Not on file  Stress:   . Feeling of Stress : Not on file  Social Connections:   . Frequency of Communication with Friends and Family: Not on file  . Frequency of Social Gatherings with Friends and Family: Not on file  . Attends Religious Services: Not on file  . Active Member of Clubs or Organizations: Not on file  . Attends Banker Meetings: Not on file  . Marital Status: Not on file  Intimate Partner Violence:   . Fear of Current or Ex-Partner: Not on file  . Emotionally Abused: Not on file  . Physically Abused: Not on file  . Sexually Abused: Not on file    Allergies  Allergen  Reactions  . Peanut-Containing Drug Products     unknown    Family History  Problem Relation Age of Onset  . Cancer Mother        colon  . Hypertension Mother   . Cancer Father        colon  . Healthy Sister   . Cancer Brother   . Healthy Child     Prior to Admission medications   Medication Sig Start Date End Date Taking? Authorizing Provider  acetaminophen (TYLENOL) 325 MG tablet Take 2 tablets (650 mg total) by mouth every 6 (six) hours as needed for mild pain (or Fever >/= 101). 07/10/20   Rai, Delene Ruffini, MD  allopurinol (ZYLOPRIM) 300 MG tablet Take 1 tablet (300 mg total) by mouth daily. 02/24/20   [name], Kandee Keen, NP  allopurinol (ZYLOPRIM) 300 MG tablet Take 300 mg by mouth daily. 05/30/20   [provider]  carbidopa-levodopa (SINEMET IR) 25-100 MG tablet Take 2 tablets at 8:30am/2 tablets at 12:30pm/1 tablet at 4:30pm Patient taking differently: Take 1-2 tablets by mouth See admin instructions. Take 2 tablets by mouth at 8:30am,2 tablets by mouth at 12:30pm then take 1 tablet by mouth at 4:30pm 04/12/20   Tat, Octaviano Batty, DO  carbidopa-levodopa (SINEMET IR) 25-100 MG tablet Take 1 tablet by mouth 4 (four) times daily. 07/10/20   Rai, Ripudeep K, MD  ELIQUIS 5 MG TABS tablet TAKE 1 TABLET BY MOUTH TWICE A DAY 09/20/19   Croitoru, Mihai, MD  ELIQUIS 5 MG TABS tablet Take 5 mg by mouth 2 (two) times daily. 05/30/20   [provider]  glipiZIDE (GLUCOTROL XL) 2.5 MG 24 hr tablet Take 1 tablet (2.5 mg total) by mouth daily with breakfast. 02/24/20   [name], Kandee Keen, NP  glipiZIDE (GLUCOTROL XL) 2.5 MG 24 hr tablet Take 2.5 mg by mouth daily. 05/30/20   [provider]  metoprolol tartrate (LOPRESSOR) 25 MG tablet TAKE 1/2 TABLET BY MOUTH TWICE DAILY Patient taking differently: Take 12.5 mg by mouth 2 (two) times daily.  07/07/19   Croitoru, Mihai, MD  simvastatin (ZOCOR) 20 MG tablet Take 1 tablet (20 mg total) by mouth at bedtime. 02/24/20   [name], Kandee Keen, NP    simvastatin (ZOCOR) 20 MG tablet Take 1 tablet (20 mg total) by mouth at bedtime. 07/16/20   Cathren Harsh, MD    Physical Exam: Vitals:   07/27/20 1300 07/27/20 1315 07/27/20 1524 07/27/20 1706  BP: 137/73 114/63 (!) 106/59 115/86  Pulse: 89 80 94 94  Resp:   19 19  Temp:   98 F (36.7 C) (!) 100.9 F (38.3 C)  TempSrc:   Oral Rectal  SpO2: 96% 96% 96% 96%     . General:  Appears calm and comfortable and is NAD but severe tremor of LUE to the point that she strikes herself in the head at times . Eyes:  PERRL, EOMI, normal lids, iris . ENT:  grossly normal hearing, lips & tongue, mmm . Neck:  no LAD, masses or thyromegaly . Cardiovascular:  RRR, no m/r/g. No LE edema.  [name] Respiratory:   CTA bilaterally with no wheezes/rales/rhonchi.  Normal respiratory effort. . Abdomen:  soft, NT, ND, NABS . Skin:  Marked erythema with white, frothy drainage under her abdominal pannus and extending onto the groin . Musculoskeletal:  abnormal tone BUE/BLE,  no bony abnormality . Lower extremity:  No LE edema.  Limited foot exam with no ulcerations.  2+ distal pulses. [name] Psychiatric:  flat mood and affect, speech fluent and appropriate, AOx3 . Neurologic:  Significant LUE tremor at rest and worse with intention    Radiological Exams on Admission: CT Head Wo Contrast  Result Date: 07/27/2020 CLINICAL DATA:  Patient found on the bathroom floor. Possible head trauma. EXAM: CT HEAD WITHOUT CONTRAST TECHNIQUE: Contiguous axial images were obtained from the base of the skull through the vertex without intravenous contrast. COMPARISON:  04/18/2020 FINDINGS: Brain: No evidence of acute infarction, hemorrhage, hydrocephalus, extra-axial collection or mass lesion/mass effect. There is ventricular and sulcal enlargement consistent with mild atrophy. Patchy periventricular white matter hypoattenuation is also noted consistent with mild chronic microvascular ischemic change. These findings are stable.  Vascular: No hyperdense vessel or unexpected calcification. Skull: Normal. Negative for fracture or focal lesion. Sinuses/Orbits: Globes and orbits are unremarkable. Sinuses are clear. Other: None. IMPRESSION: 1. No acute intracranial abnormalities. 2. Mild atrophy and chronic microvascular ischemic change. Stable appearance the prior head CT. Electronically Signed   By: Amie Portland M.D.   On: 07/27/2020 15:23   DG Chest Port 1 View  Result Date: 07/27/2020 CLINICAL DATA:  Pt bib ptar after being found on the floor. Ccollar in place. Pt states she did not fall. However pt was on the tile floor in the bathroom. Pt lives at home with her husband. Hx DM, afib, Parkinsons. C/o pain to R upper arm, denies chest complaints. EXAM: PORTABLE CHEST 1 VIEW COMPARISON:  05/17/2020 FINDINGS: Cardiac silhouette is normal in size. No mediastinal or hilar masses. Clear lungs.  No convincing pleural effusion or pneumothorax. Skeletal structures are grossly intact. IMPRESSION: No active disease. Electronically Signed   By: Amie Portland M.D.   On: 07/27/2020 15:14   DG Humerus Right  Result Date: 07/27/2020 CLINICAL DATA:  Pt bib ptar after being found on the floor. Ccollar in place. Pt states she did not fall. However pt was on the tile floor in the bathroom. Pt lives at home with her husband. Hx DM, afib, Parkinsons. C/o pain to R upper arm, denies chest complaints. EXAM: RIGHT HUMERUS - 2+ VIEW COMPARISON:  None. FINDINGS: No fracture or bone lesion. Shoulder and elbow joints are normally aligned. Soft tissues are unremarkable. IMPRESSION: No fracture or dislocation. Electronically Signed   By: Amie Portland M.D.   On: 07/27/2020 15:15    EKG: Independently reviewed.  NSR with rate 91; possibly prolonged QTs 512 (hard to tell with so much artifact); LBBB with severe artifact from tremor  Labs on Admission: I have personally reviewed the available labs and imaging studies  at the time of the admission.  Pertinent  labs:   Glucose 157 BUN 17/Creatinine 1.03/GFR 53 AST 43/ALT 25 BNP 297.7 CK 1417 HS troponin 19 Lactic acid 2.6 WBC 11.5   Assessment/Plan Active Problems:   Mixed hyperlipidemia   AKI (acute kidney injury) (HCC)   Parkinson's disease (HCC)   Class 1 obesity due to excess calories with body mass index (BMI) of 30.0 to 30.9 in adult   Traumatic rhabdomyolysis (HCC)   PAF (paroxysmal atrial fibrillation) (HCC)   DM2 (diabetes mellitus, type 2) (HCC)   HTN (hypertension)    Traumatic rhabdomyolysis -Secondary to mechanical fall in the setting of Parkinson's disease -Basically identical presentation 3 weeks ago with rehab stay in the interim -Will admit for now with aggressive IVF at 150 cc/hour -Will repeat CMP, CK, and CBC in AM -Lovenox for DVT prophylaxis -Push PO fluids if able -Strict I/Os -Vital signs q4h -Lactic acidosis is thought to be related to rhabdo rather than sepsis at this time; leukocytosis is her only SIRS criteria  AKI (acute kidney injury)  -Creatinine was 0.85 at the time of last d/c, now 1.03 -May be mild dehydration  -Anticipated improvement with IVF  Candidal panniculitis -The RN reported cleaning the pannus region and then basically immediately having recurrence of frothy white drainage c/w yeast -She has been being treated with nystatin cream -Will transition to IV Diflucan and request wound care consult and nutrition consult  PAF (paroxysmal atrial fibrillation)  -Rate controlled with Lopressor -Continue Eliquis for now but consider d/c given recurrent falls and overall prognosis  DM2 (diabetes mellitus, type 2)  -Recent A1c shows good control -hold Glucotrol -Cover with sensitive-scale SSI  HTN (hypertension) -Continue Lopressor  Parkinson's disease -She recently had her Sinemet increased but continues to have mared tremors and gait instability -Continue Sinement -Needs PT/OT re-evaluations -Needs palliative care  consult -Needs TOC assistance to help determine the next best steps as she does not appear to be safe at home  Hyperlipidemia -With recurrent rhabdo, consider d/c statin (realistically, Parkinson's is a much greater threat to her health right now than HLD)  Obesity -BMI 30.9     Note: This patient has been tested and is pending for the novel coronavirus COVID-19. The patient has been fully vaccinated against COVID-19.    DVT prophylaxis: Eliquis Code Status:  DNR - confirmed with patient/family Family Communication: Son was present throughout evaluation Disposition Plan:  The patient is from: home  Anticipated d/c is to: SNF  Anticipated d/c date will depend on clinical response to treatment, likely early next week  Patient is currently: acutely ill Consults called: Palliative care; PT/OT/Nutrition/Wound Care/TOC team Admission status:  Admit - It is my clinical opinion that admission to INPATIENT is reasonable and necessary because of the expectation that this patient will require hospital care that crosses at least 2 midnights to treat this condition based on the medical complexity of the problems presented.  Given the aforementioned information, the predictability of an adverse outcome is felt to be significant.    Jonah Blue MD [facility]   How to contact the [facility] Attending or Consulting provider 7A - 7P or covering provider during after hours 7P -7A, for this patient?  1. Check the care team in [facility] and look for a) attending/consulting TR[facility] provider listed and b) the [facility] team listed 2. Log into [email or web address] and use [facility] universal password to access. If you do not have the password, please contact the hospital operator. 3.  Locate the [facility] provider you are looking for under [facility] and page to a number that you can be directly reached. 4. If you still have difficulty reaching the provider, please page the [facility] (Director on Call) for the  Hospitalists listed on amion for assistance.   07/27/2020, 5:17 PM

## 2020-07-27 NOTE — Consult Note (Signed)
Consultation Note Date: 07/27/2020   Patient Name: Sherri Pratt  DOB: 09-26-1946  MRN: [id number]  Age / Sex: 74 y.o., female  PCP: Dorothyann Peng, NP Referring Physician: Karmen Bongo, MD  Reason for Consultation: Establishing goals of care  HPI/Patient Profile: 74 y.o. female  with past medical history of advanced Parkinson's disease, A-fib on Eliquis, DM, and HTN. Presenting to [facility] emergency department on 07/27/2020 with fall. She was previously admitted 9/24-9/28 for rhabdomyolysis and AKI secondary to fall; she was discharged to SNF rehab, sent home 2 days ago, and is presenting with the same issue. She reportedly fell last night going to the bathroom and laid in the floor overnight. She was found by her son around 11am.  She is admitted to [facility] for management of rhabdomyolysis, AKI, and candidal panniculitis.   Primary decision maker: Patient has capacity to make her own medical decisions at this time, but would recommend support from her children for any complex decisions.   Clinical Assessment and Goals of Care: I have reviewed medical records including EPIC notes, labs and imaging, and met at bedside with patient and son/Jason  to discuss diagnosis, prognosis, GOC, EOL wishes, disposition, and options.  I introduced Palliative Medicine as specialized medical care for people living with serious illness. It focuses on providing relief from the symptoms and stress of a serious illness.   We discussed a brief life review of the patient. She is retired from [employer]. She has been married for 53 years. Her husband has Alzheimer's dementia. Son reports his cognition has significantly declined. They have 3 children - Corene Cornea lives in [place], [place] lives in [place], and [place] lives in [place]. She has 4 grandchildren.   As far as functional and nutritional status, she can ambulate with a walker.    Corene Cornea expresses his concern regarding the current living situation, that it is becoming increasingly unsafe as his parents' health declines. He states in-home round the clock care is not affordable. Patient has required SNF rehab 3 times at this point. Son expresses that ideally his parents could be placed somewhere together.   We discussed her current illness and what it means in the larger context of her ongoing co-morbidities.  Natural disease trajectory of Parkinson's was discussed. Patient and family verbalize understanding that her condition is advancing.   I attempted to elicit values and goals of care important to the patient. She expresses her wish for "things to go back to the way they used to be".   The difference between aggressive medical intervention and comfort care was considered in light of the patient's goals of care. She does not want aggressive intervention, but also is not ready to consider comfort care. Encouraged patient to consider to choose an HCPOA and complete a living will, so we can assist with these documents while she is in the hospital.    Hospice and Palliative Care services outpatient were explained and offered. Patient and son are open to outpatient palliative care.   Questions and concerns  were addressed.  The family was encouraged to call with questions or concerns.    SUMMARY OF RECOMMENDATIONS   - DNR/DNI - TOC referral to assist with disposition options - spiritual care to assist with advanced directives - PMT will continue to follow  Code Status/Advance Care Planning:  DNR  Symptom Management:   Per primary team  Palliative Prophylaxis:   Frequent Pain Assessment and Turn Reposition, skin care  Psycho-social/Spiritual:   Created space and opportunity for patient and family to express thoughts and feelings regarding patient's current medical situation.   Emotional support provided   Prognosis:   Unable to determine  Discharge Planning:  To Be Determined      Primary Diagnoses: Present on Admission: . (Resolved) Rhabdomyolysis . Traumatic rhabdomyolysis (HCC) . PAF (paroxysmal atrial fibrillation) (HCC) . HTN (hypertension) . Mixed hyperlipidemia . AKI (acute kidney injury) (HCC) . Parkinson's disease (HCC)   I have reviewed the medical record, interviewed the patient and family, and examined the patient. The following aspects are pertinent.  Past Medical History:  Diagnosis Date  . Anemia   . Atrial fibrillation (HCC)    chronically on Eliquis  . Chronic kidney disease   . Diabetes mellitus   . Gout   . Hyperlipidemia   . Hypertension   . Parkinson's disease (HCC)     Family History  Problem Relation Age of Onset  . Cancer Mother        colon  . Hypertension Mother   . Cancer Father        colon  . Healthy Sister   . Cancer Brother   . Healthy Child    Scheduled Meds: . [START ON 07/28/2020] allopurinol  300 mg Oral Daily  . apixaban  5 mg Oral BID  . [START ON 07/28/2020] carbidopa-levodopa  2 tablet Oral BID   And  . carbidopa-levodopa  1 tablet Oral Daily  . docusate sodium  100 mg Oral BID  . insulin aspart  0-9 Units Subcutaneous TID WC  . [START ON 07/28/2020] metoprolol tartrate  12.5 mg Oral BID  . sodium chloride flush  3 mL Intravenous Q12H   Continuous Infusions: . fluconazole (DIFLUCAN) IV    . lactated ringers 150 mL/hr at 07/27/20 1700   PRN Meds:.acetaminophen **OR** acetaminophen, bisacodyl, hydrALAZINE, HYDROcodone-acetaminophen, morphine injection, ondansetron **OR** ondansetron (ZOFRAN) IV, polyethylene glycol Medications Prior to Admission:  Prior to Admission medications   Medication Sig Start Date End Date Taking? Authorizing Provider  acetaminophen (TYLENOL) 325 MG tablet Take 2 tablets (650 mg total) by mouth every 6 (six) hours as needed for mild pain (or Fever >/= 101). 07/10/20  Yes Rai, Ripudeep K, MD  allopurinol (ZYLOPRIM) 300 MG tablet Take 1 tablet (300 mg  total) by mouth daily. 02/24/20  Yes [name], Kandee Keen, NP  carbidopa-levodopa (SINEMET IR) 25-100 MG tablet Take 2 tablets at 8:30am/2 tablets at 12:30pm/1 tablet at 4:30pm Patient taking differently: Take 1-2 tablets by mouth See admin instructions. Take 2 tablets by mouth at 8:30am,2 tablets by mouth at 12:30pm then take 1 tablet by mouth at 4:30pm 04/12/20  Yes Tat, Rebecca S, DO  ELIQUIS 5 MG TABS tablet TAKE 1 TABLET BY MOUTH TWICE A DAY Patient taking differently: Take 5 mg by mouth 2 (two) times daily.  09/20/19  Yes Croitoru, Mihai, MD  glipiZIDE (GLUCOTROL XL) 2.5 MG 24 hr tablet Take 1 tablet (2.5 mg total) by mouth daily with breakfast. 02/24/20  Yes [name], Kandee Keen, NP  metoprolol tartrate (LOPRESSOR) 25  MG tablet TAKE 1/2 TABLET BY MOUTH TWICE DAILY Patient taking differently: Take 12.5 mg by mouth 2 (two) times daily.  07/07/19  Yes Croitoru, Mihai, MD  nystatin cream (MYCOSTATIN) Apply 1 application topically in the morning and at bedtime.   Yes [provider]  simvastatin (ZOCOR) 20 MG tablet Take 1 tablet (20 mg total) by mouth at bedtime. 02/24/20  Yes [name], Tommi Rumps, NP   Allergies  Allergen Reactions  . Peanut-Containing Drug Products     unknown   Review of Systems  Musculoskeletal:       Right arm/shoulder pain  Neurological: Positive for tremors.    Physical Exam Vitals reviewed.  Constitutional:      General: She is not in acute distress. Cardiovascular:     Rate and Rhythm: Normal rate and regular rhythm.  Pulmonary:     Effort: Pulmonary effort is normal.  Neurological:     Mental Status: She is alert and oriented to person, place, and time.     Motor: Tremor present.     Vital Signs: BP 97/68   Pulse 92   Temp (!) 100.9 F (38.3 C) (Rectal)   Resp 18   SpO2 94%  Pain Scale: 0-10   Pain Score: 0-No pain   SpO2: SpO2: 94 % O2 Device:SpO2: 94 % O2 Flow Rate: .      Palliative Assessment/Data: PPS 50%    Time In: 18:40 Time Out:  19:30 Time Total: 50 minutes Greater than 50%  of this time was spent counseling and coordinating care related to the above assessment and plan.  Signed by: Lavena Bullion, NP   Please contact Palliative Medicine Team phone at [phone] for questions and concerns.  For individual provider: See Shea Evans

## 2020-07-27 NOTE — ED Provider Notes (Addendum)
[facility]ENC[facility] Provider Note   CSN: [id number] Arrival date & time: 07/27/20  1233     History No chief complaint on file.   Sherri Pratt is a 74 y.o. female.  HPI She was discharged from her rehab facility 2 days ago.  She has significant Parkinson's disease and imbalance with frequent falls.  He lives with her husband who has dementia and cannot assist her.  Patient's sons come in to check on them but do not live with them.  Saturday evening one of the patient's sons was with her until about 7.  She reports she went into the bathroom and ended up falling/sliding to the floor at about 730.  She could not get back up and spent the night on the tile floor.  She was found at about 11 in the morning when her other son came in to check on her.  She reports she has some pain in her left upper arm.  Has a constant tremor that she cannot control due to Parkinson's.  Reports she might of fallen on her or laid on it.  She denies she has headache.  She denies neck pain.  She denies chest pain.  She has not had vomiting or diarrhea.     Past Medical History:  Diagnosis Date  . Anemia   . Atrial fibrillation (HCC)    chronically on Eliquis  . Chronic kidney disease   . Diabetes mellitus   . Gout   . Hyperlipidemia   . Hypertension   . Parkinson's disease [facility](HCC)     Patient Active Problem List   Diagnosis Date Noted  . AKI (acute kidney injury) (HCC) 07/06/2020  . Traumatic rhabdomyolysis (HCC) 07/06/2020  . PAF (paroxysmal atrial fibrillation) (HCC) 07/06/2020  . DM2 (diabetes mellitus, type 2) (HCC) 07/06/2020  . HTN (hypertension) 07/06/2020  . Parkinson's disease (HCC) 07/06/2020  . Pressure injury of skin 04/20/2020  . Sepsis (HCC) 04/19/2020  . Severe sepsis (HCC) 04/18/2020  . CAP (community acquired pneumonia) 04/18/2020  . General weakness 04/18/2020  . Acute metabolic encephalopathy 04/18/2020  . Mild obesity 11/12/2018  . Parkinson's  disease (HCC) 10/19/2018  . Prolonged QT interval 10/13/2018  . Hypokalemia 10/13/2018  . SDH (subdural hematoma) (HCC) 10/13/2018  . Syncope and collapse 10/12/2018  . Type 2 diabetes mellitus with hypoglycemia without coma (HCC) 10/12/2018  . Paroxysmal atrial fibrillation (HCC) 04/17/2017  . Hypophosphatemia 04/17/2017  . Hypomagnesemia 04/17/2017  . Acute lower UTI 04/17/2017  . Hyperthyroidism 04/17/2017  . AKI (acute kidney injury) (HCC) 04/16/2017  . Anemia 11/08/2012  . GOUT 11/12/2010  . Chronic kidney disease 03/27/2009  . Diabetes 1.5, managed as type 1 (HCC) 09/21/2007  . Mixed hyperlipidemia 09/21/2007  . Essential hypertension 09/21/2007    Past Surgical History:  Procedure Laterality Date  . APPENDECTOMY       OB History   No obstetric history on file.     Family History  Problem Relation Age of Onset  . Cancer Mother        colon  . Hypertension Mother   . Cancer Father        colon  . Healthy Sister   . Cancer Brother   . Healthy Child     Social History   Tobacco Use  . Smoking status: Never Smoker  . Smokeless tobacco: Never Used  Substance Use Topics  . Alcohol use: No  . Drug use: No    Home Medications Prior  to Admission medications   Medication Sig Start Date End Date Taking? Authorizing Provider  acetaminophen (TYLENOL) 325 MG tablet Take 2 tablets (650 mg total) by mouth every 6 (six) hours as needed for mild pain (or Fever >/= 101). 07/10/20   Rai, Delene Ruffini, MD  allopurinol (ZYLOPRIM) 300 MG tablet Take 1 tablet (300 mg total) by mouth daily. 02/24/20   [name], Kandee Keen, NP  allopurinol (ZYLOPRIM) 300 MG tablet Take 300 mg by mouth daily. 05/30/20   [provider]  carbidopa-levodopa (SINEMET IR) 25-100 MG tablet Take 2 tablets at 8:30am/2 tablets at 12:30pm/1 tablet at 4:30pm Patient taking differently: Take 1-2 tablets by mouth See admin instructions. Take 2 tablets by mouth at 8:30am,2 tablets by mouth at 12:30pm then take  1 tablet by mouth at 4:30pm 04/12/20   Tat, Octaviano Batty, DO  carbidopa-levodopa (SINEMET IR) 25-100 MG tablet Take 1 tablet by mouth 4 (four) times daily. 07/10/20   Rai, Ripudeep K, MD  ELIQUIS 5 MG TABS tablet TAKE 1 TABLET BY MOUTH TWICE A DAY 09/20/19   Croitoru, Mihai, MD  ELIQUIS 5 MG TABS tablet Take 5 mg by mouth 2 (two) times daily. 05/30/20   [provider]  glipiZIDE (GLUCOTROL XL) 2.5 MG 24 hr tablet Take 1 tablet (2.5 mg total) by mouth daily with breakfast. 02/24/20   [name], Kandee Keen, NP  glipiZIDE (GLUCOTROL XL) 2.5 MG 24 hr tablet Take 2.5 mg by mouth daily. 05/30/20   [provider]  metoprolol tartrate (LOPRESSOR) 25 MG tablet TAKE 1/2 TABLET BY MOUTH TWICE DAILY Patient taking differently: Take 12.5 mg by mouth 2 (two) times daily.  07/07/19   Croitoru, Mihai, MD  simvastatin (ZOCOR) 20 MG tablet Take 1 tablet (20 mg total) by mouth at bedtime. 02/24/20   [name], Kandee Keen, NP  simvastatin (ZOCOR) 20 MG tablet Take 1 tablet (20 mg total) by mouth at bedtime. 07/16/20   Rai, Delene Ruffini, MD    Allergies    Peanut-containing drug products  Review of Systems   Review of Systems 10 systems reviewed and negative except as per HPI Physical Exam Updated Vital Signs BP (!) 106/59 (BP Location: Left Arm)   Pulse 94   Temp 98 F (36.7 C) (Oral)   Resp 19   SpO2 96%   Physical Exam Constitutional:      Appearance: She is well-developed.     Comments: Alert.  No respiratory distress.  HENT:     Head: Normocephalic and atraumatic.     Mouth/Throat:     Pharynx: Oropharynx is clear.  Eyes:     Extraocular Movements: Extraocular movements intact.     Pupils: Pupils are equal, round, and reactive to light.  Cardiovascular:     Rate and Rhythm: Normal rate and regular rhythm.     Heart sounds: Normal heart sounds.  Pulmonary:     Effort: Pulmonary effort is normal.     Breath sounds: Normal breath sounds.  Abdominal:     General: Bowel sounds are normal. There is no  distension.     Palpations: Abdomen is soft.     Tenderness: There is no abdominal tenderness.  Musculoskeletal:     Cervical back: Neck supple.     Comments: Patient holds the left arm flexed at the elbow with constant tremor of the lower portion the arm.  Some tenderness at the left shoulder and upper arm.  Patient however does have intact range of motion.  Extremities are symmetric without deformity.  And dry.  Skin:    General: Skin is warm and dry.  Neurological:     Mental Status: She is alert and oriented to person, place, and time.     GCS: GCS eye subscore is 4. GCS verbal subscore is 5. GCS motor subscore is 6.     Coordination: Coordination normal.     Comments: Constant tremor of the left upper extremity.  Mental status is clear.  Follows commands to best to physical capacity.  Limited capacity due to deconditioning.  Psychiatric:        Mood and Affect: Mood normal.     ED Results / Procedures / Treatments   Labs (all labs ordered are listed, but only abnormal results are displayed) Labs Reviewed  COMPREHENSIVE METABOLIC PANEL - Abnormal; Notable for the following components:      Result Value   Glucose, Bld 157 (*)    Creatinine, Ser 1.03 (*)    AST 43 (*)    GFR, Estimated 53 (*)    All other components within normal limits  LACTIC ACID, PLASMA - Abnormal; Notable for the following components:   Lactic Acid, Venous 2.6 (*)    All other components within normal limits  CBC WITH DIFFERENTIAL/PLATELET - Abnormal; Notable for the following components:   WBC 11.5 (*)    Neutro Abs 9.9 (*)    All other components within normal limits  CK - Abnormal; Notable for the following components:   Total CK 1,417 (*)    All other components within normal limits  BRAIN NATRIURETIC PEPTIDE - Abnormal; Notable for the following components:   B Natriuretic Peptide 297.7 (*)    All other components within normal limits  TROPONIN I (HIGH SENSITIVITY) - Abnormal; Notable for the  following components:   Troponin I (High Sensitivity) 19 (*)    All other components within normal limits  RESPIRATORY PANEL BY RT PCR (FLU A&B, COVID)  PROTIME-INR  LACTIC ACID, PLASMA  URINALYSIS, ROUTINE W REFLEX MICROSCOPIC  TROPONIN I (HIGH SENSITIVITY)    EKG None  Radiology CT Head Wo Contrast  Result Date: 07/27/2020 CLINICAL DATA:  Patient found on the bathroom floor. Possible head trauma. EXAM: CT HEAD WITHOUT CONTRAST TECHNIQUE: Contiguous axial images were obtained from the base of the skull through the vertex without intravenous contrast. COMPARISON:  04/18/2020 FINDINGS: Brain: No evidence of acute infarction, hemorrhage, hydrocephalus, extra-axial collection or mass lesion/mass effect. There is ventricular and sulcal enlargement consistent with mild atrophy. Patchy periventricular white matter hypoattenuation is also noted consistent with mild chronic microvascular ischemic change. These findings are stable. Vascular: No hyperdense vessel or unexpected calcification. Skull: Normal. Negative for fracture or focal lesion. Sinuses/Orbits: Globes and orbits are unremarkable. Sinuses are clear. Other: None. IMPRESSION: 1. No acute intracranial abnormalities. 2. Mild atrophy and chronic microvascular ischemic change. Stable appearance the prior head CT. Electronically Signed   By: Amie Portland M.D.   On: 07/27/2020 15:23   DG Chest Port 1 View  Result Date: 07/27/2020 CLINICAL DATA:  Pt bib ptar after being found on the floor. Ccollar in place. Pt states she did not fall. However pt was on the tile floor in the bathroom. Pt lives at home with her husband. Hx DM, afib, Parkinsons. C/o pain to R upper arm, denies chest complaints. EXAM: PORTABLE CHEST 1 VIEW COMPARISON:  05/17/2020 FINDINGS: Cardiac silhouette is normal in size. No mediastinal or hilar masses. Clear lungs.  No convincing pleural effusion or pneumothorax. Skeletal structures are grossly  intact. IMPRESSION: No active  disease. Electronically Signed   By: Amie Portland M.D.   On: 07/27/2020 15:14   DG Humerus Right  Result Date: 07/27/2020 CLINICAL DATA:  Pt bib ptar after being found on the floor. Ccollar in place. Pt states she did not fall. However pt was on the tile floor in the bathroom. Pt lives at home with her husband. Hx DM, afib, Parkinsons. C/o pain to R upper arm, denies chest complaints. EXAM: RIGHT HUMERUS - 2+ VIEW COMPARISON:  None. FINDINGS: No fracture or bone lesion. Shoulder and elbow joints are normally aligned. Soft tissues are unremarkable. IMPRESSION: No fracture or dislocation. Electronically Signed   By: Amie Portland M.D.   On: 07/27/2020 15:15    Procedures Procedures (including critical care time)  Medications Ordered in ED Medications - No data to display  ED Course  I have reviewed the triage vital signs and the nursing notes.  Pertinent labs & imaging results that were available during my care of the patient were reviewed by me and considered in my medical decision making (see chart for details).    MDM Rules/Calculators/A&P                          Consult: Dr. Ophelia Charter for admission.  Patient brought by EMS for fall or slow collapsed to the ground.  Patient has Parkinson's and has significant gait instability.  Patient son reports she is falling frequently.  She had been in rehabilitation facility and only home 2 days.  He reports that she does not have adequate care at home due to her husband having advanced dementia.  Patient has had recurrence of rhabdomyolysis likely traumatic due to being on a tile floor all night.  Also some rebound and mild AKI.  Cognitively, patient is intact.  She is answering questions appropriately.  He does not have focal motor deficit but has persistent uncontrollable tremor of the left upper extremity.  At this time, plan for readmission for rhabdomyolysis and significant gait instability with failure of ADLs with acute on chronic kidney  injury Final Clinical Impression(s) / ED Diagnoses Final diagnoses:  Fall, initial encounter  Traumatic rhabdomyolysis, initial encounter (HCC)  Parkinson's disease (HCC)  AKI (acute kidney injury) (HCC)  Gait instability    Rx / DC Orders ED Discharge Orders    None       Arby Barrette, MD 07/27/20 1605    Arby Barrette, MD 07/27/20 1621

## 2020-07-27 NOTE — ED Triage Notes (Signed)
Pt bib ptar after being found on the floor. Ccollar in place. Pt states she did not fall. However pt was on the tile floor in the bathroom. Pt lives at home with her husband. Hx DM, afib, Parkinsons. C/o pain to R upper arm.  cbg 198 122/82 HR 96 97%

## 2020-07-27 NOTE — Telephone Encounter (Signed)
Please call Kenney Houseman from [facility].  She has called several times here with no response.  She needs to know if [facility] agrees to Home-Health orders for patient.

## 2020-07-28 DIAGNOSIS — E869 Volume depletion, unspecified: Secondary | ICD-10-CM

## 2020-07-28 DIAGNOSIS — Z515 Encounter for palliative care: Secondary | ICD-10-CM

## 2020-07-28 DIAGNOSIS — N179 Acute kidney failure, unspecified: Secondary | ICD-10-CM

## 2020-07-28 DIAGNOSIS — Z7189 Other specified counseling: Secondary | ICD-10-CM

## 2020-07-28 LAB — CBC
HCT: 34.3 % — ABNORMAL LOW (ref 36.0–46.0)
Hemoglobin: 10.7 g/dL — ABNORMAL LOW (ref 12.0–15.0)
MCH: 27 pg (ref 26.0–34.0)
MCHC: 31.2 g/dL (ref 30.0–36.0)
MCV: 86.4 fL (ref 80.0–100.0)
Platelets: 197 10*3/uL (ref 150–400)
RBC: 3.97 MIL/uL (ref 3.87–5.11)
RDW: 15.2 % (ref 11.5–15.5)
WBC: 8.1 10*3/uL (ref 4.0–10.5)
nRBC: 0 % (ref 0.0–0.2)

## 2020-07-28 LAB — COMPREHENSIVE METABOLIC PANEL
ALT: 8 U/L (ref 0–44)
AST: 27 U/L (ref 15–41)
Albumin: 2.8 g/dL — ABNORMAL LOW (ref 3.5–5.0)
Alkaline Phosphatase: 53 U/L (ref 38–126)
Anion gap: 10 (ref 5–15)
BUN: 24 mg/dL — ABNORMAL HIGH (ref 8–23)
CO2: 24 mmol/L (ref 22–32)
Calcium: 8.9 mg/dL (ref 8.9–10.3)
Chloride: 106 mmol/L (ref 98–111)
Creatinine, Ser: 1.1 mg/dL — ABNORMAL HIGH (ref 0.44–1.00)
GFR, Estimated: 49 mL/min — ABNORMAL LOW (ref >60)
Glucose, Bld: 109 mg/dL — ABNORMAL HIGH (ref 70–99)
Potassium: 3.6 mmol/L (ref 3.5–5.1)
Sodium: 140 mmol/L (ref 135–145)
Total Bilirubin: 0.8 mg/dL (ref 0.3–1.2)
Total Protein: 5.3 g/dL — ABNORMAL LOW (ref 6.5–8.1)

## 2020-07-28 LAB — GLUCOSE, CAPILLARY
Glucose-Capillary: 96 mg/dL (ref 70–99)
Glucose-Capillary: 115 mg/dL — ABNORMAL HIGH (ref 70–99)
Glucose-Capillary: 143 mg/dL — ABNORMAL HIGH (ref 70–99)
Glucose-Capillary: 137 mg/dL — ABNORMAL HIGH (ref 70–99)

## 2020-07-28 LAB — CK: Total CK: 787 U/L — ABNORMAL HIGH (ref 38–234)

## 2020-07-28 MED ORDER — PROSOURCE PLUS PO LIQD
30.0000 mL | Freq: Two times a day (BID) | ORAL | Status: DC
  Administered 2020-07-28 – 2020-08-02 (×10): 30 mL via ORAL
  Filled 2020-07-28 (×10): qty 30

## 2020-07-28 MED ORDER — GLUCERNA SHAKE PO LIQD
237.0000 mL | Freq: Three times a day (TID) | ORAL | Status: DC
  Administered 2020-07-28 – 2020-08-02 (×14): 237 mL via ORAL
  Filled 2020-07-28 (×3): qty 237

## 2020-07-28 NOTE — Progress Notes (Signed)
Initial Nutrition Assessment  RD working remotely.  DOCUMENTATION CODES:   Obesity unspecified  INTERVENTION:   - Please obtain measured weight (weight on admission appears to be stated)  - Glucerna Shake po TID, each supplement provides 220 kcal and 10 grams of protein  - ProSource Plus 45 ml BID, each supplement provides 100 kcal and 15 grams of protein  NUTRITION DIAGNOSIS:   Increased nutrient needs related to acute illness as evidenced by estimated needs.  GOAL:   Patient will meet greater than or equal to 90% of their needs  MONITOR:   PO intake, Supplement acceptance, Labs, Weight trends, Skin  REASON FOR ASSESSMENT:   Consult Other (nutritional goals)  ASSESSMENT:   74 year old female who presented to the ED on 10/15 after a fall. PMH of advanced Parkinson's disease, atrial fibrillation, DM, HTN. Pt previously admitted from 9/24 to 9/28 for rhabdo and AKI associated with a fall then discharged to SNF rehab.   Pt currently on a carb modified diet. No meal completions recorded at this time.  RD was unable to reach pt via phone call to room. Reviewed weight history in chart. Weight of 196 lbs on admission appears to be stated rather than measured. Recommend obtaining measured admission weight.  Prior to this admission, pt's weight had been trending down (93.6 kg on 7/12, 92.5 kg on 8/5, and 81.6 kg on 9/24).  RD will order oral nutrition supplements to aid pt in meeting kcal and protein needs during admission.  Medications reviewed and include: sinemet, colace, SSI, IV diflucan IVF: LR @ 150 ml/hr  Labs reviewed: BUN 24, creatinine 1.10 CBG's: 96, 145  NUTRITION - FOCUSED PHYSICAL EXAM:  Unable to complete at this time. RD working remotely.  Diet Order:   Diet Order            Diet Carb Modified Fluid consistency: Thin; Room service appropriate? Yes  Diet effective now                 EDUCATION NEEDS:   No education needs have been identified at  this time  Skin:  Skin Assessment: Skin Integrity Issues: Other: MASD perineum  Last BM:  07/26/20 per pt report  Height:   Ht Readings from Last 1 Encounters:  07/28/20 5\' 4"  (1.626 m)    Weight:   Wt Readings from Last 1 Encounters:  07/28/20 88.9 kg    BMI:  Body mass index is 33.64 kg/m.  Estimated Nutritional Needs:   Kcal:  1650-1850  Protein:  85-100 grams  Fluid:  1.7-1.9 L    07/30/20, MS, RD, LDN Inpatient Clinical Dietitian Please see [facility] for contact information.

## 2020-07-28 NOTE — Progress Notes (Signed)
PROGRESS NOTE    Sherri Pratt  TDV:[id number] DOB: 09/11/1946 DOA: 07/27/2020 PCP: [name], NP  Outpatient Specialists:   Brief Narrative:  Patient is a 74 year old Caucasian female past medical history significant for advanced Parkinson's disease, atrial fibrillation on Eliquis, diabetes mellitus and hypertension.  Patient was recently admitted to the hospital (from 07/06/2020 to 07/10/2020) with rhabdomyolysis and AKI following a fall.  Patient will discharge to skilled nursing facility for rehab and was eventually discharged back home 2 days ago.  Patient presents following a fall.  Patient sounds well and depleted.  Assessment & Plan:   Principal Problem:   Traumatic rhabdomyolysis (HCC) Active Problems:   Mixed hyperlipidemia   AKI (acute kidney injury) (HCC)   Parkinson's disease (HCC)   Class 1 obesity due to excess calories with body mass index (BMI) of 30.0 to 30.9 in adult   PAF (paroxysmal atrial fibrillation) (HCC)   DM2 (diabetes mellitus, type 2) (HCC)   HTN (hypertension)   Palliative care by specialist   Goals of care, counseling/discussion  Traumatic rhabdomyolysis -Elevated CPK, likely secondary to mechanical fall in the setting of Parkinson's disease -Repeat CPK in the morning. -Basically identical presentation 3 weeks ago with rehab stay in the interim -Continue aggressive IVF at 150 cc/hour -Will repeat CMP, CK, and CBC in AM -Strict I/Os  Volume depletion:  -Continue IV fluids.    Candidal panniculitis -Continue Diflucan   PAF (paroxysmal atrial fibrillation)  -Rate controlled with Lopressor -Low threshold to consult cardiology to advise regarding safety of continuing Eliquis.    DM2 (diabetes mellitus, type 2)  -Continue sliding scale insulin.  HTN (hypertension) -Continue Lopressor  Parkinson's disease -Patient continues to have tremors.   -Continue Sinement -PT/OT re-evaluations -Palliative care consult -TOC assistance to  help determine the next best steps as she does not appear to be safe at home  Obesity -BMI 30.9  DVT prophylaxis: Eliquis. Code Status: DO NOT RESUSCITATE. Family Communication:  Disposition Plan: This will depend on hospital course.   Consultants:   Palliative care team.  Low threshold to consult cardiology to advise safety of Eliquis, considering recurrent falls.  Procedures:   None  Antimicrobials:   None   Subjective: No new complaints. Patient is not particularly good historian.  Objective: Vitals:   07/28/20 0500 07/28/20 0712 07/28/20 0742 07/28/20 1211  BP:  (!) 127/58 122/80 130/75  Pulse:  60 61 73  Resp:  18 18 18   Temp:  98.3 F (36.8 C) 98.6 F (37 C) (!) 97.3 F (36.3 C)  TempSrc:  Oral Oral Oral  SpO2:  96% 97% 98%  Weight: 88.9 kg     Height: 5\' 4"  (1.626 m)       Intake/Output Summary (Last 24 hours) at 07/28/2020 1258 Last data filed at 07/28/2020 0344 Gross per 24 hour  Intake 1910 ml  Output 150 ml  Net 1760 ml   Filed Weights   07/28/20 0500  Weight: 88.9 kg    Examination:  General exam: Appears calm and comfortable.  Dry buccal mucosa. Respiratory system: Clear to auscultation.  Cardiovascular system: S1 & S2 heard. Gastrointestinal system: Abdomen is obese, soft and nontender. No organomegaly or masses felt. Normal bowel sounds heard. Central nervous system: Alert and oriented.  Tremors from Parkinson's disease.  Extremities: No leg edema.  Data Reviewed: I have personally reviewed following labs and imaging studies  CBC: Recent Labs  Lab 07/27/20 1340 07/28/20 0205  WBC 11.5* 8.1  NEUTROABS 9.9*  --  HGB 12.6 10.7*  HCT 40.3 34.3*  MCV 86.9 86.4  PLT 228 197   Basic Metabolic Panel: Recent Labs  Lab 07/27/20 1340 07/28/20 0205  NA 138 140  K 4.2 3.6  CL 103 106  CO2 25 24  GLUCOSE 157* 109*  BUN 17 24*  CREATININE 1.03* 1.10*  CALCIUM 9.4 8.9   GFR: Estimated Creatinine Clearance: 47.7 mL/min  (A) (by C-G formula based on SCr of 1.1 mg/dL (H)). Liver Function Tests: Recent Labs  Lab 07/27/20 1340 07/28/20 0205  AST 43* 27  ALT 25 8  ALKPHOS 69 53  BILITOT 0.8 0.8  PROT 6.7 5.3*  ALBUMIN 3.5 2.8*   No results for input(s): LIPASE, AMYLASE in the last 168 hours. No results for input(s): AMMONIA in the last 168 hours. Coagulation Profile: Recent Labs  Lab 07/27/20 1340  INR 1.0   Cardiac Enzymes: Recent Labs  Lab 07/27/20 1340 07/28/20 0205  CKTOTAL 1,417* 787*   BNP (last 3 results) No results for input(s): PROBNP in the last 8760 hours. HbA1C: No results for input(s): HGBA1C in the last 72 hours. CBG: Recent Labs  Lab 07/27/20 2217 07/28/20 0704 07/28/20 1203  GLUCAP 145* 96 115*   Lipid Profile: No results for input(s): CHOL, HDL, LDLCALC, TRIG, CHOLHDL, LDLDIRECT in the last 72 hours. Thyroid Function Tests: No results for input(s): TSH, T4TOTAL, FREET4, T3FREE, THYROIDAB in the last 72 hours. Anemia Panel: No results for input(s): VITAMINB12, FOLATE, FERRITIN, TIBC, IRON, RETICCTPCT in the last 72 hours. Urine analysis:    Component Value Date/Time   COLORURINE YELLOW 07/27/2020 1700   APPEARANCEUR CLEAR 07/27/2020 1700   LABSPEC 1.016 07/27/2020 1700   PHURINE 6.0 07/27/2020 1700   GLUCOSEU 50 (A) 07/27/2020 1700   HGBUR NEGATIVE 07/27/2020 1700   HGBUR negative 11/04/2010 0921   BILIRUBINUR NEGATIVE 07/27/2020 1700   BILIRUBINUR 1+ 09/27/2018 1156   KETONESUR 5 (A) 07/27/2020 1700   PROTEINUR NEGATIVE 07/27/2020 1700   UROBILINOGEN 1.0 09/27/2018 1156   UROBILINOGEN 0.2 11/04/2010 0921   NITRITE NEGATIVE 07/27/2020 1700   LEUKOCYTESUR NEGATIVE 07/27/2020 1700   Sepsis Labs: @LABRCNTIP (procalcitonin:4,lacticidven:4)  ) Recent Results (from the past 240 hour(s))  Respiratory Panel by RT PCR (Flu A&B, Covid) - Nasopharyngeal Swab     Status: None   Collection Time: 07/27/20  4:49 PM   Specimen: Nasopharyngeal Swab  Result Value Ref  Range Status   SARS Coronavirus 2 by RT PCR NEGATIVE NEGATIVE Final    Comment: (NOTE) SARS-CoV-2 target nucleic acids are NOT DETECTED.  The SARS-CoV-2 RNA is generally detectable in upper respiratoy specimens during the acute phase of infection. The lowest concentration of SARS-CoV-2 viral copies this assay can detect is 131 copies/mL. A negative result does not preclude SARS-Cov-2 infection and should not be used as the sole basis for treatment or other patient management decisions. A negative result may occur with  improper specimen collection/handling, submission of specimen other than nasopharyngeal swab, presence of viral mutation(s) within the areas targeted by this assay, and inadequate number of viral copies (<131 copies/mL). A negative result must be combined with clinical observations, patient history, and epidemiological information. The expected result is Negative.  Fact Sheet for Patients:  07/29/20  Fact Sheet for Healthcare Providers:  https://www.moore.com/  This test is no t yet approved or cleared by the https://www.young.biz/ FDA and  has been authorized for detection and/or diagnosis of SARS-CoV-2 by FDA under an Emergency Use Authorization (EUA). This EUA will remain  in effect (meaning this test can be used) for the duration of the COVID-19 declaration under Section 564(b)(1) of the Act, 21 U.S.C. section 360bbb-3(b)(1), unless the authorization is terminated or revoked sooner.     Influenza A by PCR NEGATIVE NEGATIVE Final   Influenza B by PCR NEGATIVE NEGATIVE Final    Comment: (NOTE) The Xpert Xpress SARS-CoV-2/FLU/RSV assay is intended as an aid in  the diagnosis of influenza from Nasopharyngeal swab specimens and  should not be used as a sole basis for treatment. Nasal washings and  aspirates are unacceptable for Xpert Xpress SARS-CoV-2/FLU/RSV  testing.  Fact Sheet for  Patients: https://www.moore.com/  Fact Sheet for Healthcare Providers: https://www.young.biz/  This test is not yet approved or cleared by the [place] FDA and  has been authorized for detection and/or diagnosis of SARS-CoV-2 by  FDA under an Emergency Use Authorization (EUA). This EUA will remain  in effect (meaning this test can be used) for the duration of the  Covid-19 declaration under Section 564(b)(1) of the Act, 21  U.S.C. section 360bbb-3(b)(1), unless the authorization is  terminated or revoked. Performed at [facility], [address], [place], [place] [address]          Radiology Studies: CT Head Wo Contrast  Result Date: 07/27/2020 CLINICAL DATA:  Patient found on the bathroom floor. Possible head trauma. EXAM: CT HEAD WITHOUT CONTRAST TECHNIQUE: Contiguous axial images were obtained from the base of the skull through the vertex without intravenous contrast. COMPARISON:  04/18/2020 FINDINGS: Brain: No evidence of acute infarction, hemorrhage, hydrocephalus, extra-axial collection or mass lesion/mass effect. There is ventricular and sulcal enlargement consistent with mild atrophy. Patchy periventricular white matter hypoattenuation is also noted consistent with mild chronic microvascular ischemic change. These findings are stable. Vascular: No hyperdense vessel or unexpected calcification. Skull: Normal. Negative for fracture or focal lesion. Sinuses/Orbits: Globes and orbits are unremarkable. Sinuses are clear. Other: None. IMPRESSION: 1. No acute intracranial abnormalities. 2. Mild atrophy and chronic microvascular ischemic change. Stable appearance the prior head CT. Electronically Signed   By: Amie Portland M.D.   On: 07/27/2020 15:23   DG Chest Port 1 View  Result Date: 07/27/2020 CLINICAL DATA:  Pt bib ptar after being found on the floor. Ccollar in place. Pt states she did not fall. However pt was on the tile floor in  the bathroom. Pt lives at home with her husband. Hx DM, afib, Parkinsons. C/o pain to R upper arm, denies chest complaints. EXAM: PORTABLE CHEST 1 VIEW COMPARISON:  05/17/2020 FINDINGS: Cardiac silhouette is normal in size. No mediastinal or hilar masses. Clear lungs.  No convincing pleural effusion or pneumothorax. Skeletal structures are grossly intact. IMPRESSION: No active disease. Electronically Signed   By: Amie Portland M.D.   On: 07/27/2020 15:14   DG Humerus Right  Result Date: 07/27/2020 CLINICAL DATA:  Pt bib ptar after being found on the floor. Ccollar in place. Pt states she did not fall. However pt was on the tile floor in the bathroom. Pt lives at home with her husband. Hx DM, afib, Parkinsons. C/o pain to R upper arm, denies chest complaints. EXAM: RIGHT HUMERUS - 2+ VIEW COMPARISON:  None. FINDINGS: No fracture or bone lesion. Shoulder and elbow joints are normally aligned. Soft tissues are unremarkable. IMPRESSION: No fracture or dislocation. Electronically Signed   By: Amie Portland M.D.   On: 07/27/2020 15:15        Scheduled Meds: . (feeding supplement) PROSource Plus  30 mL Oral BID BM  . allopurinol  300 mg Oral Daily  . apixaban  5 mg Oral BID  . carbidopa-levodopa  2 tablet Oral BID   And  . carbidopa-levodopa  1 tablet Oral Daily  . docusate sodium  100 mg Oral BID  . feeding supplement (GLUCERNA SHAKE)  237 mL Oral TID BM  . insulin aspart  0-9 Units Subcutaneous TID WC  . metoprolol tartrate  12.5 mg Oral BID  . sodium chloride flush  3 mL Intravenous Q12H   Continuous Infusions: . fluconazole (DIFLUCAN) IV 100 mg (07/27/20 2209)  . lactated ringers 150 mL/hr at 07/28/20 0702     LOS: 1 day    Time spent: 35 minutes.    Berton MountSylvester Oluwateniola Leitch, MD  [facility] Pager #: [phone] 7PM-7AM contact night coverage as above

## 2020-07-28 NOTE — Progress Notes (Signed)
Daily Progress Note   Patient Name: Sherri Pratt       Date: 07/28/2020 DOB: Feb 22, 1946  Age: 74 y.o. MRN#: [id number] Attending Physician: Bonnell Public, MD Primary Care Physician: Dorothyann Peng, NP Admit Date: 07/27/2020  Reason for Follow-up: continued GOC discussion, disposition, psychosocial support  Subjective: Patient alert and oriented, no complaints.   Advanced directives, concepts specific to code status, artifical feeding and hydration, and rehospitalization were considered and discussed (see below). Patient has decided to name her son Sherri Pratt as Sherri Pratt.   The difference between aggressive medical intervention and comfort care was discussed. Patient states she would not want aggressive interventions of any kind. She states she would not want any interventions to prolong her life.    Completed a MOST form today. The patient outlined their wishes for the following treatment decisions:  Cardiopulmonary Resuscitation: Do Not Attempt Resuscitation (DNR/No CPR)  Medical Interventions: Comfort Measures: Keep clean, warm, and dry. Use medication by any route, positioning, wound care, and other measures to relieve pain and suffering. Use oxygen, suction and manual treatment of airway obstruction as needed for comfort. Do not transfer to the hospital unless comfort needs cannot be met in current location.  Antibiotics: Determine use of limitation of antibiotics when infection occurs  IV Fluids: IV fluids for a defined trial period  Feeding Tube: No feeding tube     Length of Stay: 1  Current Medications: Scheduled Meds:  . (feeding supplement) PROSource Plus  30 mL Oral BID BM  . allopurinol  300 mg Oral Daily  . apixaban  5 mg Oral BID  . carbidopa-levodopa  2 tablet Oral BID    And  . carbidopa-levodopa  1 tablet Oral Daily  . docusate sodium  100 mg Oral BID  . feeding supplement (GLUCERNA SHAKE)  237 mL Oral TID BM  . insulin aspart  0-9 Units Subcutaneous TID WC  . metoprolol tartrate  12.5 mg Oral BID  . sodium chloride flush  3 mL Intravenous Q12H    Continuous Infusions: . fluconazole (DIFLUCAN) IV 100 mg (07/27/20 2209)  . lactated ringers 150 mL/hr at 07/28/20 1428    PRN Meds: acetaminophen **OR** acetaminophen, bisacodyl, hydrALAZINE, HYDROcodone-acetaminophen, morphine injection, ondansetron **OR** ondansetron (ZOFRAN) IV, polyethylene glycol  Physical Exam Vitals reviewed.  Constitutional:  General: She is not in acute distress. HENT:     Head: Normocephalic and atraumatic.  Cardiovascular:     Rate and Rhythm: Normal rate and regular rhythm.  Pulmonary:     Effort: Pulmonary effort is normal.  Neurological:     Mental Status: She is alert and oriented to person, place, and time.     Motor: Tremor present.             Vital Signs: BP 130/75 (BP Location: Right Arm)   Pulse 73   Temp (!) 97.3 F (36.3 C) (Oral)   Resp 18   Ht $R'5\' 4"'Fd$  (1.626 m)   Wt 88.9 kg   SpO2 98%   BMI 33.64 kg/m  SpO2: SpO2: 98 % O2 Device: O2 Device: Room Air O2 Flow Rate:    Intake/output summary:   Intake/Output Summary (Last 24 hours) at 07/28/2020 1550 Last data filed at 07/28/2020 0344 Gross per 24 hour  Intake 1910 ml  Output 150 ml  Net 1760 ml   LBM: Last BM Date: 07/26/20 (Per pt patient) Baseline Weight: Weight: 88.9 kg Most recent weight: Weight: 88.9 kg       Palliative Assessment/Data: PPS 50%      Palliative Care Assessment & Plan   HPI/Patient Profile: 74 y.o. female  with past medical history of advanced Parkinson's disease, A-fib on Eliquis, DM, and HTN. Presenting to [facility] emergency department on 07/27/2020 with fall. She was previously admitted 9/24-9/28 for rhabdomyolysis and AKI secondary to fall; she was  discharged to SNF rehab, sent home 2 days ago, and is presenting with the same issue. She reportedly fell last night going to the bathroom and laid in the floor overnight. She was found by her son around 11am.  She is admitted to [facility] for management of rhabdomyolysis, AKI, and candidal panniculitis.   Assessment: - advanced Parkinson's disease - traumatic rhabdomyolysis - gait instability - candidal panniculitis - volume depletion - paroxysmal A-fib - need for advanced care planning  Recommendations/Plan: - MOST form completed and placed on shadow chart - patient would not want any aggressive or life-prolonging interventions - spiritual care to follow-up Monday for completion of HCPOA documents - recommend outpatient palliative care at discharge  Code Status: DNR/DNI  Prognosis:   Unable to determine  Discharge Planning:  [facility] for rehab with Palliative care service follow-up   Thank you for allowing the Palliative Medicine Team to assist in the care of this patient.   Total Time 25 miutes Prolonged Time Billed  no       Greater than 50%  of this time was spent counseling and coordinating care related to the above assessment and plan.  Lavena Bullion, NP  Please contact Palliative Medicine Team phone at [phone] for questions and concerns.

## 2020-07-28 NOTE — Plan of Care (Signed)
Pt is alert and oriented X4. Transferred to the unit from ED by ED staff member. Pt is situated and oriented to the room. Able to make needs known. Call bell within reach. No respiratory distress noted at this time. Will continue monitoring the patient.

## 2020-07-28 NOTE — Evaluation (Signed)
Physical Therapy Evaluation Patient Details Name: Sherri Pratt MRN: [id number] DOB: April 20, 1946 Today's Date: 07/28/2020   History of Present Illness  74 y.o. female admitted with recurrent falls, rhabdo, and AKI. PMHx significant for PAF on eliquis, DM2, HTN, gout, parkinson's dz.  Clinical Impression  Pt admitted with above diagnosis. Required up to mod assist with transfer training today. She is actually ambulating a bit better this afternoon compared to this morning with OT, no longer leaning posteriorly with LOB. Due to recurrent falls, weakness, and lack of available supervision the safest disposition would be d/c to a skilled facility such as SNF or ALF if she qualifies. Pt currently with functional limitations due to the deficits listed below (see PT Problem List). Pt will benefit from skilled PT to increase their independence and safety with mobility to allow discharge to the venue listed below.       Follow Up Recommendations SNF (if not an option, consider maximizing HH services.)    Equipment Recommendations  None recommended by PT    Recommendations for Other Services       Precautions / Restrictions Precautions Precautions: Fall Precaution Comments: Recurrent falls without injury Restrictions Weight Bearing Restrictions: No      Mobility  Bed Mobility Overal bed mobility: Needs Assistance Bed Mobility: Supine to Sit;Sit to Supine Rolling: Mod assist   Supine to sit: Mod assist Sit to supine: Min assist   General bed mobility comments: Mod assist for trunk support, pt able to pull through LUE to rise to EOB with PT help. Min assist for LE support back in bed.  Transfers Overall transfer level: Needs assistance Equipment used: Rolling walker (2 wheeled) Transfers: Sit to/from Stand Sit to Stand: Min assist Stand pivot transfers: Min assist       General transfer comment: Min assist for boost to stand from slightly elevated bed surface. Required a couple of  attempts and cues for  hand placement.  Ambulation/Gait Ambulation/Gait assistance: Min guard Gait Distance (Feet): 60 Feet Assistive device: Rolling walker (2 wheeled) Gait Pattern/deviations: Step-through pattern;Decreased stride length;Trunk flexed Gait velocity: decreased Gait velocity interpretation: <1.8 ft/sec, indicate of risk for recurrent falls General Gait Details: Slightly flexed trunk using RW appropriately, cues for forward gaze. No overt LOB noted, able to handle turns without assistance. Close guard for safety.  Stairs            Wheelchair Mobility    Modified Rankin (Stroke Patients Only)       Balance Overall balance assessment: Needs assistance Sitting-balance support: No upper extremity supported;Feet supported Sitting balance-Leahy Scale: Fair     Standing balance support: Bilateral upper extremity supported Standing balance-Leahy Scale: Poor                               Pertinent Vitals/Pain Pain Assessment: 0-10 Pain Score: 4  Pain Location: RUE Pain Descriptors / Indicators: Numbness;Aching;Sore Pain Intervention(s): Monitored during session;Repositioned    Home Living Family/patient expects to be discharged to:: Private residence Living Arrangements: Spouse/significant other Available Help at Discharge: Family Type of Home: House Home Access: Stairs to enter Entrance Stairs-Rails: None Entrance Stairs-Number of Steps: 4 Home Layout: One level Home Equipment: Environmental consultant - 2 wheels;Toilet riser;Shower seat Additional Comments: Patients son lives nearby. Patients husband has dementia. She is the primary caregiver.    Prior Function Level of Independence: Independent with assistive device(s)         Comments: Using 8LFY  all the time.     Hand Dominance   Dominant Hand: Right    Extremity/Trunk Assessment   Upper Extremity Assessment Upper Extremity Assessment: Defer to OT evaluation;Difficult to assess due to  impaired cognition (Complains of Rt shoulder and hand weakness)    Lower Extremity Assessment Lower Extremity Assessment: Generalized weakness (moderate tremor LLE)       Communication   Communication: No difficulties  Cognition Arousal/Alertness: Awake/alert Behavior During Therapy: WFL for tasks assessed/performed Overall Cognitive Status: Within Functional Limits for tasks assessed                                        General Comments      Exercises     Assessment/Plan    PT Assessment Patient needs continued PT services  PT Problem List Decreased strength;Decreased activity tolerance;Decreased balance;Decreased mobility;Decreased knowledge of use of DME;Decreased range of motion;Decreased safety awareness;Pain       PT Treatment Interventions DME instruction;Gait training;Functional mobility training;Therapeutic activities;Therapeutic exercise;Stair training;Balance training;Neuromuscular re-education    PT Goals (Current goals can be found in the Care Plan section)  Acute Rehab PT Goals Patient Stated Goal: Go home today PT Goal Formulation: With patient/family Time For Goal Achievement: 08/11/20 Potential to Achieve Goals: Good    Frequency Min 3X/week   Barriers to discharge Decreased caregiver support she is caregiver for husband    Co-evaluation               AM-PAC PT "6 Clicks" Mobility  Outcome Measure Help needed turning from your back to your side while in a flat bed without using bedrails?: A Lot Help needed moving from lying on your back to sitting on the side of a flat bed without using bedrails?: A Lot Help needed moving to and from a bed to a chair (including a wheelchair)?: A Little Help needed standing up from a chair using your arms (e.g., wheelchair or bedside chair)?: A Little Help needed to walk in hospital room?: A Little Help needed climbing 3-5 steps with a railing? : A Lot 6 Click Score: 15    End of Session  Equipment Utilized During Treatment: Gait belt Activity Tolerance: Patient tolerated treatment well Patient left: in bed;with call bell/phone within reach;with bed alarm set;with family/visitor present Nurse Communication: Mobility status PT Visit Diagnosis: Unsteadiness on feet (R26.81);Other abnormalities of gait and mobility (R26.89);Repeated falls (R29.6);Muscle weakness (generalized) (M62.81);History of falling (Z91.81);Pain Pain - Right/Left: Right Pain - part of body: Shoulder;Hand    Time: 4854-6270 PT Time Calculation (min) (ACUTE ONLY): 17 min   Charges:   PT Evaluation $PT Eval Low Complexity: 1 Low          Charlsie Merles, PT, DPT  Berton Mount 07/28/2020, 3:34 PM

## 2020-07-28 NOTE — Evaluation (Signed)
Occupational Therapy Evaluation Patient Details Name: Sherri Pratt MRN: [id number] DOB: 25-Aug-1946 Today's Date: 07/28/2020    History of Present Illness 74 y.o. female admitted with recurrent falls, rhabdo, and AKI. PMHx significant for PAF on eliquis, DM2, HTN, gout, parkinson's dz.   Clinical Impression   Patient admitted with the above diagnosis.  Presents with generalized weakness, movement disorder, declines to balance, decreased activity tolerance; all of which are impacting her independence in the acute setting.  She is having some mild soreness to her R shoulder.  At home, patient states she was able to complete her own self care, but this is doubtful.  OT would assume she needed assist with LB ADL, toileting, functional mobility, meals and home management and medications from her children.  Her spouse has dementia, and is not a reliable caregiver.  She is hoping to go home with [facility] services, but it appears family may be seeking ALF/SNF for both of them.  OT will continue to follow in the acute setting.      Follow Up Recommendations  Home health OT;SNF;Supervision/Assistance - 24 hour    Equipment Recommendations       Recommendations for Other Services       Precautions / Restrictions Precautions Precautions: Fall Precaution Comments: Recurrent falls without injury Restrictions Weight Bearing Restrictions: No      Mobility Bed Mobility Overal bed mobility: Needs Assistance Bed Mobility: Rolling;Supine to Sit Rolling: Mod assist   Supine to sit: Max assist        Transfers Overall transfer level: Needs assistance Equipment used: Rolling walker (2 wheeled) Transfers: Sit to/from [employer] Sit to Stand: Mod assist Stand pivot transfers: Min assist       General transfer comment: intially leans back, min A to lean forward and get weight on the balls of her feet.    Balance Overall balance assessment: Needs assistance Sitting-balance  support: Bilateral upper extremity supported;Feet supported Sitting balance-Leahy Scale: Fair     Standing balance support: Bilateral upper extremity supported;During functional activity Standing balance-Leahy Scale: Poor                             ADL either performed or assessed with clinical judgement   ADL Overall ADL's : Needs assistance/impaired Eating/Feeding: Sitting;Set up   Grooming: Wash/dry hands;Wash/dry face;Set up;Sitting   Upper Body Bathing: Minimal assistance;Sitting   Lower Body Bathing: Maximal assistance;Sit to/from stand   Upper Body Dressing : Minimal assistance;Sitting   Lower Body Dressing: Maximal assistance;Sit to/from stand   Toilet Transfer: Minimal assistance;RW   Toileting- Clothing Manipulation and Hygiene: Moderate assistance;Sit to/from stand       Functional mobility during ADLs: Minimal assistance       Vision Baseline Vision/History: Wears glasses Wears Glasses: At all times Patient Visual Report: No change from baseline       Perception     Praxis      Pertinent Vitals/Pain Faces Pain Scale: Hurts a little bit Pain Location: R shoulder after the fall Pain Descriptors / Indicators: Sore Pain Intervention(s): Monitored during session     Hand Dominance Right   Extremity/Trunk Assessment Upper Extremity Assessment Upper Extremity Assessment: Generalized weakness   Lower Extremity Assessment Lower Extremity Assessment: Defer to PT evaluation       Communication Communication Communication: No difficulties   Cognition Arousal/Alertness: Awake/alert Behavior During Therapy: WFL for tasks assessed/performed Overall Cognitive Status: Within Functional Limits for tasks assessed  General Comments   VSS throughout    Exercises     Shoulder Instructions      Home Living Family/patient expects to be discharged to:: Private residence Living  Arrangements: Spouse/significant other Available Help at Discharge: Family Type of Home: House Home Access: Stairs to enter Secretary/administrator of Steps: 4 Entrance Stairs-Rails: None Home Layout: One level     Bathroom Shower/Tub: Producer, television/film/video: Standard     Home Equipment: Environmental consultant - 2 wheels;Toilet riser;Shower seat   Additional Comments: Patients son lives nearby. Patients husband has dementia. She is the primary caregiver.      Prior Functioning/Environment Level of Independence: Independent with assistive device(s)        Comments: Using 2WRW all the time.        OT Problem List: Decreased strength;Decreased activity tolerance;Impaired balance (sitting and/or standing);Decreased coordination;Decreased safety awareness;Decreased knowledge of use of DME or AE;Impaired sensation      OT Treatment/Interventions: Self-care/ADL training;Energy conservation;DME and/or AE instruction;Therapeutic exercise;Therapeutic activities;Patient/family education;Balance training    OT Goals(Current goals can be found in the care plan section) Acute Rehab OT Goals Patient Stated Goal: Would like to move better and go home OT Goal Formulation: With patient/family Time For Goal Achievement: 08/11/20 Potential to Achieve Goals: Fair ADL Goals Pt Will Perform Grooming: with set-up;standing Pt Will Perform Lower Body Bathing: sit to/from stand;with min guard assist Pt Will Perform Lower Body Dressing: with min guard assist;sit to/from stand Pt Will Transfer to Toilet: with supervision;bedside commode Pt Will Perform Toileting - Clothing Manipulation and hygiene: with min guard assist;sit to/from stand  OT Frequency: Min 2X/week   Barriers to D/C:            Co-evaluation              AM-PAC OT "6 Clicks" Daily Activity     Outcome Measure Help from another person eating meals?: A Little Help from another person taking care of personal grooming?: A  Little Help from another person toileting, which includes using toliet, bedpan, or urinal?: A Lot Help from another person bathing (including washing, rinsing, drying)?: A Lot Help from another person to put on and taking off regular upper body clothing?: A Little Help from another person to put on and taking off regular lower body clothing?: A Lot 6 Click Score: 15   End of Session Equipment Utilized During Treatment: Gait belt;Rolling walker Nurse Communication: Mobility status  Activity Tolerance: Patient tolerated treatment well Patient left: in chair;with call bell/phone within reach;with chair alarm set;with nursing/sitter in room  OT Visit Diagnosis: Unsteadiness on feet (R26.81);Repeated falls (R29.6);Muscle weakness (generalized) (M62.81);Pain Pain - Right/Left: Right Pain - part of body: Shoulder                Time: 2130-8657 OT Time Calculation (min): 26 min Charges:  OT General Charges $OT Visit: 1 Visit OT Evaluation $OT Eval Moderate Complexity: 1 Mod OT Treatments $Self Care/Home Management : 8-22 mins  07/28/2020  Rich, OTR/L  Acute Rehabilitation Services  Office:  [phone]   Suzanna Obey 07/28/2020, 11:58 AM

## 2020-07-29 DIAGNOSIS — T796XXA Traumatic ischemia of muscle, initial encounter: Principal | ICD-10-CM

## 2020-07-29 DIAGNOSIS — I10 Essential (primary) hypertension: Secondary | ICD-10-CM

## 2020-07-29 DIAGNOSIS — Z515 Encounter for palliative care: Secondary | ICD-10-CM

## 2020-07-29 DIAGNOSIS — Z7901 Long term (current) use of anticoagulants: Secondary | ICD-10-CM

## 2020-07-29 DIAGNOSIS — I480 Paroxysmal atrial fibrillation: Secondary | ICD-10-CM

## 2020-07-29 DIAGNOSIS — W19XXXA Unspecified fall, initial encounter: Secondary | ICD-10-CM | POA: Diagnosis not present

## 2020-07-29 LAB — RENAL FUNCTION PANEL
Albumin: 2.7 g/dL — ABNORMAL LOW (ref 3.5–5.0)
Anion gap: 9 (ref 5–15)
BUN: 19 mg/dL (ref 8–23)
CO2: 25 mmol/L (ref 22–32)
Calcium: 8.9 mg/dL (ref 8.9–10.3)
Chloride: 108 mmol/L (ref 98–111)
Creatinine, Ser: 1.03 mg/dL — ABNORMAL HIGH (ref 0.44–1.00)
GFR, Estimated: 53 mL/min — ABNORMAL LOW (ref >60)
Glucose, Bld: 109 mg/dL — ABNORMAL HIGH (ref 70–99)
Phosphorus: 3.4 mg/dL (ref 2.5–4.6)
Potassium: 3.7 mmol/L (ref 3.5–5.1)
Sodium: 142 mmol/L (ref 135–145)

## 2020-07-29 LAB — CBC
HCT: 33.8 % — ABNORMAL LOW (ref 36.0–46.0)
Hemoglobin: 10.8 g/dL — ABNORMAL LOW (ref 12.0–15.0)
MCH: 27.7 pg (ref 26.0–34.0)
MCHC: 32 g/dL (ref 30.0–36.0)
MCV: 86.7 fL (ref 80.0–100.0)
Platelets: 174 10*3/uL (ref 150–400)
RBC: 3.9 MIL/uL (ref 3.87–5.11)
RDW: 15 % (ref 11.5–15.5)
WBC: 5.1 10*3/uL (ref 4.0–10.5)
nRBC: 0 % (ref 0.0–0.2)

## 2020-07-29 LAB — GLUCOSE, CAPILLARY
Glucose-Capillary: 104 mg/dL — ABNORMAL HIGH (ref 70–99)
Glucose-Capillary: 141 mg/dL — ABNORMAL HIGH (ref 70–99)
Glucose-Capillary: 166 mg/dL — ABNORMAL HIGH (ref 70–99)

## 2020-07-29 LAB — CK: Total CK: 371 U/L — ABNORMAL HIGH (ref 38–234)

## 2020-07-29 LAB — MAGNESIUM: Magnesium: 1.7 mg/dL (ref 1.7–2.4)

## 2020-07-29 MED ORDER — LACTATED RINGERS IV SOLN: INTRAVENOUS | Status: AC

## 2020-07-29 NOTE — Progress Notes (Signed)
NEURO HOSPITALIST PROGRESS NOTE   Requestig physician: Dr. Julian Reil  Reason for Consult: Increased falls in the setting of Parkinson's disease  Interval History:                                                                                                            Pt is A&Ox4 and good historian today. She tells me she is followed closely as outpt by Dr Tat for her Parkinson's disease and does NOT want [place] to change anything at this time. She notes that her Sinemet was recently changed to a special regiment at home: Sinemet IR 50-200 at 0800 and 1200, then at 1630 she takes 25-100 (not 25/100 BID as noted previously in chart). It was previously suggested by Dr Georg Ruddle to increase Sinemet 25/100 to QID dosing based upon the notion that she was only on 25/100 BID. According to the current MAR, pt is taking as prescribed at home per recent change by Dr Tat. The pt states that her tremors are not "worse" since admit nor have they improved with the new regiment.  Past Medical History:  Diagnosis Date  . Anemia   . Atrial fibrillation (HCC)    chronically on Eliquis  . Chronic kidney disease   . Diabetes mellitus   . Gout   . Hyperlipidemia   . Hypertension   . Parkinson's disease (HCC)      Family History  Problem Relation Age of Onset  . Cancer Mother        colon  . Hypertension Mother   . Cancer Father        colon  . Healthy Sister   . Cancer Brother   . Healthy Child               Social History:  reports that she has never smoked. She has never used smokeless tobacco. She reports that she does not drink alcohol and does not use drugs.  Allergies  Allergen Reactions  . Peanut-Containing Drug Products     unknown    HOME MEDICATIONS:                                                                                                                      No current facility-administered medications on file prior to encounter.   Current Outpatient  Medications on File Prior to Encounter  Medication Sig Dispense Refill  . acetaminophen (TYLENOL) 325 MG tablet  Take 2 tablets (650 mg total) by mouth every 6 (six) hours as needed for mild pain (or Fever >/= 101).    [name] allopurinol (ZYLOPRIM) 300 MG tablet Take 1 tablet (300 mg total) by mouth daily. 90 tablet 3  . carbidopa-levodopa (SINEMET IR) 25-100 MG tablet Take 2 tablets at 8:30am/2 tablets at 12:30pm/1 tablet at 4:30pm (Patient taking differently: Take 1-2 tablets by mouth See admin instructions. Take 2 tablets by mouth at 8:30am,2 tablets by mouth at 12:30pm then take 1 tablet by mouth at 4:30pm) 450 tablet 1  . ELIQUIS 5 MG TABS tablet TAKE 1 TABLET BY MOUTH TWICE A DAY (Patient taking differently: Take 5 mg by mouth 2 (two) times daily. ) 60 tablet 6  . glipiZIDE (GLUCOTROL XL) 2.5 MG 24 hr tablet Take 1 tablet (2.5 mg total) by mouth daily with breakfast. 90 tablet 1  . metoprolol tartrate (LOPRESSOR) 25 MG tablet TAKE 1/2 TABLET BY MOUTH TWICE DAILY (Patient taking differently: Take 12.5 mg by mouth 2 (two) times daily. ) 90 tablet 1  . nystatin cream (MYCOSTATIN) Apply 1 application topically in the morning and at bedtime.    . simvastatin (ZOCOR) 20 MG tablet Take 1 tablet (20 mg total) by mouth at bedtime. 90 tablet 3     ROS:                                                                                                                               Reports that her left arm is sore from falls. Denies any other complaint except for what is outlined in interval history for tremors d/t PD.    Blood pressure (!) 142/74, pulse 60, temperature 98 F (36.7 C), temperature source Oral, resp. rate 18, height 5\' 4"  (1.626 m), weight 86.7 kg, SpO2 96 %.   General Examination:                                                                                                   Physical Exam  GEN- no acute distress HEENT-  Normocephalic Lungs- Respirations unlabored Extremities- No edema.  Warm and well-perfused  Neurological Examination Mental Status: Alert, oriented x4. hypomimia, but good attention and cognition. Speech is slow, but no dysarthria or aphasia.Follows all commands Cranial Nerves: II: PERRL. Temporal visual fields intact to DSS without extinction.   III,IV, VI: No ptosis.EOMI. VII: Facial movement is symmetric in the context of hypomimia.  VIII: Hearing intact to voice IX,X: Mild hypophonia.  XI: Head is midline.  XII: normal Motor: There is cogwheeling  and rigidity noted. There is a coarse resting tremor to bilateral hands is noted, worse on the left. The tremor improves with movement of the affected limb. Slow to achieve repeated movements such as touching nose w/alt hands; unable to preform rapid movements. BUE strength is 5/5 BLE: Able to elevate antigravity briefly, gen weak w/o focal deficits. Did not give good effort when resisting examiner in HF and KE.  Sensory: Subjectively intact to FT  Deep Tendon Reflexes: 1+ bilateral brachioradialis. Could not elicit patellar or achilles reflexes.  Plantars: Equivocal bilaterally  Cerebellar: No ataxia with FNF bilaterally  Gait: Deferred   Lab Results: Basic Metabolic Panel: Recent Labs  Lab 07/27/20 1340 07/28/20 0205 07/29/20 0327  NA 138 140 142  K 4.2 3.6 3.7  CL 103 106 108  CO2 25 24 25   GLUCOSE 157* 109* 109*  BUN 17 24* 19  CREATININE 1.03* 1.10* 1.03*  CALCIUM 9.4 8.9 8.9  MG  --   --  1.7  PHOS  --   --  3.4    CBC: Recent Labs  Lab 07/27/20 1340 07/28/20 0205 07/29/20 0327  WBC 11.5* 8.1 5.1  NEUTROABS 9.9*  --   --   HGB 12.6 10.7* 10.8*  HCT 40.3 34.3* 33.8*  MCV 86.9 86.4 86.7  PLT 228 197 174    Cardiac Enzymes: Recent Labs  Lab 07/27/20 1340 07/28/20 0205 07/29/20 0327  CKTOTAL 1,417* 787* 371*    Lipid Panel: No results for input(s): CHOL, TRIG, HDL, CHOLHDL, VLDL, LDLCALC in the last 168 hours.  Imaging: CT Head Wo Contrast  Result Date:  07/27/2020 CLINICAL DATA:  Patient found on the bathroom floor. Possible head trauma. EXAM: CT HEAD WITHOUT CONTRAST TECHNIQUE: Contiguous axial images were obtained from the base of the skull through the vertex without intravenous contrast. COMPARISON:  04/18/2020 FINDINGS: Brain: No evidence of acute infarction, hemorrhage, hydrocephalus, extra-axial collection or mass lesion/mass effect. There is ventricular and sulcal enlargement consistent with mild atrophy. Patchy periventricular white matter hypoattenuation is also noted consistent with mild chronic microvascular ischemic change. These findings are stable. Vascular: No hyperdense vessel or unexpected calcification. Skull: Normal. Negative for fracture or focal lesion. Sinuses/Orbits: Globes and orbits are unremarkable. Sinuses are clear. Other: None. IMPRESSION: 1. No acute intracranial abnormalities. 2. Mild atrophy and chronic microvascular ischemic change. Stable appearance the prior head CT. Electronically Signed   By: 06/19/2020 M.D.   On: 07/27/2020 15:23   DG Chest Port 1 View  Result Date: 07/27/2020 CLINICAL DATA:  Pt bib ptar after being found on the floor. Ccollar in place. Pt states she did not fall. However pt was on the tile floor in the bathroom. Pt lives at home with her husband. Hx DM, afib, Parkinsons. C/o pain to R upper arm, denies chest complaints. EXAM: PORTABLE CHEST 1 VIEW COMPARISON:  05/17/2020 FINDINGS: Cardiac silhouette is normal in size. No mediastinal or hilar masses. Clear lungs.  No convincing pleural effusion or pneumothorax. Skeletal structures are grossly intact. IMPRESSION: No active disease. Electronically Signed   By: 07/17/2020 M.D.   On: 07/27/2020 15:14   DG Humerus Right  Result Date: 07/27/2020 CLINICAL DATA:  Pt bib ptar after being found on the floor. Ccollar in place. Pt states she did not fall. However pt was on the tile floor in the bathroom. Pt lives at home with her husband. Hx DM, afib,  Parkinsons. C/o pain to R upper arm, denies chest complaints. EXAM: RIGHT HUMERUS - 2+  VIEW COMPARISON:  None. FINDINGS: No fracture or bone lesion. Shoulder and elbow joints are normally aligned. Soft tissues are unremarkable. IMPRESSION: No fracture or dislocation. Electronically Signed   By: Amie Portlandavid  Ormond M.D.   On: 07/27/2020 15:15    Assessment: 74 year old female with Parkinson's disease and recent falls. Neurology was consulted for possible titration of Sinemet dose.  CT head reveals no acute abnormality. Stable, moderate generalized cerebral atrophy and chronic small vessel ischemic changes are noted.   Pt is A&Ox4 and good historian today. She tells me she is followed closely as outpt by Dr Tat for her Parkinson's disease and does NOT want us to change anything at this time. She notes that her Sinemet was recently changed to a special regiment at home: Sinemet IR 50-200 at 0800 and 1200, then at 1630 she takes 25-100 (not 25/100 BID as noted previously in chart). It was previously suggested by Dr Georg RuddleLinzen to increase Sinemet 25/100 to QID dosing based upon the notion that she was only on 25/100 BID. According to the current MAR, pt is taking as prescribed at home per recent change by Dr Tat.   Recommendations: 1.Remain at Dr Don Perkingat's recent recommended regiment for Sinemet:   -50/200 at 0800 & 1200  -25/100 at 1700 2.Could consider adding a 4th dose if it seems that last dose at 1700 is wearing off, which I did suggest to pt, but she says she should ask Dr Tat. 3.Continue rehab efforts is most important at this time. 4. Outpatient Neurology follow up within 2 weeks of discharge. Typically in acute care setting changes in Sinemet is deferred to out pt team, esp when a pt has been closely followed. 5. Management of traumatic rhabdomyolysis and AKI per primary team.   Kaelie Henigan Metzger-[place]hel[place], ARNP-C, ANVP-BC Pager: [phone]

## 2020-07-29 NOTE — TOC Initial Note (Signed)
Transition of Care [facility]) - Initial/Assessment Note    Patient Details  Name: Sherri Pratt MRN: [id number] Date of Birth: Jul 07, 1946  [facility] CM/SW Contact:    Bary Castilla, LCSW Phone Number: [phone] 07/29/2020, 2:29 PM  Clinical Narrative:                  CSW met with patient and patient's son t to discuss PT Corene Cornea recommendation of a SNF. Patient was aware of recommendation and in agreement with going to a [facility] SNF. CSW discussed the SNF process.CSW provided patient with medicare.gov rating list.  Patient gave CSW permission to fax referrals out to local facilities.CSW answered questions about the SNF process and the next steps in the process. Patient does not want to go to [facility] and preference is [facility]. Corene Cornea also wanted resources on memory care for his father in hopes of his parents being eventually placed together.  Patient has [employer] and CSW received a copy of the card.  TOC team will continue to assist with discharge planning needs.     Expected Discharge Plan: Skilled Nursing Facility Barriers to Discharge: Ship broker, Continued Medical Work up, SNF Pending bed offer   Patient Goals and CMS Choice Patient states their goals for this hospitalization and ongoing recovery are:: Able to go home CMS Medicare.gov Compare Post Acute Care list provided to:: Patient Choice offered to / list presented to : Patient, Adult Children  Expected Discharge Plan and Services Expected Discharge Plan: [place] arrangements for the past 2 months: Single Family Home                                      Prior Living Arrangements/Services Living arrangements for the past 2 months: Single Family Home Lives with:: Spouse Patient language and need for interpreter reviewed:: Yes Do you feel safe going back to the place where you live?: Yes        Care giver support system in place?: Yes (comment)       Activities of Daily Living      Permission Sought/Granted Permission sought to share information with : Family Supports Permission granted to share information with : Yes, Verbal Permission Granted  Share Information with NAME: Corene Cornea  Permission granted to share info w AGENCY: SNFs  Permission granted to share info w Relationship: son  Permission granted to share info w Contact Information: [phone]  Emotional Assessment Appearance:: Appears stated age Attitude/Demeanor/Rapport: Engaged Affect (typically observed): Accepting, Pleasant Orientation: : Oriented to Self, Oriented to Place, Oriented to  Time      Admission diagnosis:  Rhabdomyolysis [M62.82] Gait instability [R26.81] AKI (acute kidney injury) ([place]) [N17.9] Parkinson's disease ([place]) [G20] Fall, initial encounter [W19.XXXA] Traumatic rhabdomyolysis, initial encounter ([place]) [T79.6XXA] Patient Active Problem List   Diagnosis Date Noted  . Palliative care by specialist   . Goals of care, counseling/discussion   . Fall   . Gait instability   . AKI (acute kidney injury) ([place]) 07/06/2020  . Traumatic rhabdomyolysis ([place]) 07/06/2020  . PAF (paroxysmal atrial fibrillation) ([place]) 07/06/2020  . DM2 (diabetes mellitus, type 2) ([place]) 07/06/2020  . HTN (hypertension) 07/06/2020  . Parkinson's disease ([place]) 07/06/2020  . Pressure injury of skin 04/20/2020  . Sepsis ([place]) 04/19/2020  . Severe sepsis ([place]) 04/18/2020  . CAP (community acquired pneumonia)  04/18/2020  . General weakness 04/18/2020  . Acute metabolic encephalopathy 63/78/5885  . Class 1 obesity due to excess calories with body mass index (BMI) of 30.0 to 30.9 in adult 11/12/2018  . Parkinson's disease ([place]) 10/19/2018  . Prolonged QT interval 10/13/2018  . Hypokalemia 10/13/2018  . SDH (subdural hematoma) ([place]) 10/13/2018  . Syncope and collapse 10/12/2018  . Type 2 diabetes mellitus with hypoglycemia without coma ([place]) 10/12/2018  . Paroxysmal  atrial fibrillation ([place]) 04/17/2017  . Hypophosphatemia 04/17/2017  . Hypomagnesemia 04/17/2017  . Acute lower UTI 04/17/2017  . Hyperthyroidism 04/17/2017  . AKI (acute kidney injury) ([place]) 04/16/2017  . Anemia 11/08/2012  . GOUT 11/12/2010  . Chronic kidney disease 03/27/2009  . Diabetes 1.5, managed as type 1 ([place]) 09/21/2007  . Mixed hyperlipidemia 09/21/2007  . Essential hypertension 09/21/2007   PCP:  Dorothyann Peng, NP Pharmacy:   [facility] [address] [place] [address] Phone: [phone] Fax: [phone]     Social Determinants of Health (SDOH) Interventions    Readmission Risk Interventions Readmission Risk Prevention Plan 07/10/2020  Post Dischage Appt Complete  Medication Screening Complete  Transportation Screening Complete  Some recent data might be hidden

## 2020-07-29 NOTE — Progress Notes (Signed)
PROGRESS NOTE    Gaspar BiddingJudy C Schmieder  AVW:[id number] DOB: 1946/10/03 DOA: 07/27/2020 PCP: Shirline FreesNafziger, Cory, NP  Outpatient Specialists:   Brief Narrative:  Patient is a 74 year old Caucasian female past medical history significant for advanced Parkinson's disease, atrial fibrillation on Eliquis, diabetes mellitus and hypertension.  Patient was recently admitted to the hospital (from 07/06/2020 to 07/10/2020) with rhabdomyolysis and AKI following a fall.  Patient was discharged to skilled nursing facility for rehab and was eventually discharged back home 2 days prior to re-presentation.  Patient presented following a fall.  Patient seemed volume depleted on presentation.  07/29/2020: Patient was seen alongside patient's son and husband.  Patient's husband has advanced dementia and depends on patient.  Patient looks better today.  Physical therapy team is recommended skilled nursing facility placement.  Will consult social work team.  We also consulted neurology and cardiology to assist with patient's management.  We need cardiology to advise safety of continuing Eliquis, considering recurrent falls.  Hopefully, neurology will help with adjusting patient's Parkinson's disease medications.  Further management depend on hospital course.  Assessment & Plan:   Principal Problem:   Traumatic rhabdomyolysis (HCC) Active Problems:   Mixed hyperlipidemia   AKI (acute kidney injury) (HCC)   Parkinson's disease (HCC)   Class 1 obesity due to excess calories with body mass index (BMI) of 30.0 to 30.9 in adult   PAF (paroxysmal atrial fibrillation) (HCC)   DM2 (diabetes mellitus, type 2) (HCC)   HTN (hypertension)   Palliative care by specialist   Goals of care, counseling/discussion  Traumatic rhabdomyolysis -Elevated CPK, likely secondary to mechanical fall in the setting of Parkinson's disease -CPK has continued to fall. -CPK level today is 371.   -Basically identical presentation 3 weeks ago with rehab  stay in the interim -Decrease IV fluids.   -Continue to monitor renal function and electrolytes.   Volume depletion:  -Resolved significantly.      Candidal panniculitis -Continue Diflucan   PAF (paroxysmal atrial fibrillation)  -Rate controlled with Lopressor -Currently on Eliquis. -Consulted cardiology team to advise safety of continuing Eliquis.    DM2 (diabetes mellitus, type 2)  -Continue sliding scale insulin.  HTN (hypertension) -Continue Lopressor  Parkinson's disease -Patient continues to have tremors.   -Continue Sinement -Neurology consulted to assist with Parkinson's disease medication adjustment. -PT has recommended skilled nursing facility placement. -Palliative care input is appreciated.    Obesity -BMI 30.9  DVT prophylaxis: Eliquis. Code Status: DO NOT RESUSCITATE. Family Communication:  Disposition Plan: This will depend on hospital course.   Consultants:   Palliative care team.  Cardiology.  Neurology.    Procedures:   None  Antimicrobials:   None   Subjective: No new complaints. Patient is not particularly good historian. Looks better today.  Objective: Vitals:   07/28/20 1926 07/29/20 0258 07/29/20 1050 07/29/20 1109  BP: (!) 117/54 (!) 152/77 (!) 142/74   Pulse: 66 60 60   Resp: 18 16 18    Temp: 98.7 F (37.1 C) (!) 97.4 F (36.3 C)  98 F (36.7 C)  TempSrc: Oral   Oral  SpO2: 96% 97% 96%   Weight:  86.7 kg    Height:        Intake/Output Summary (Last 24 hours) at 07/29/2020 1413 Last data filed at 07/29/2020 1254 Gross per 24 hour  Intake 1437 ml  Output 4350 ml  Net -2913 ml   Filed Weights   07/28/20 0500 07/29/20 0258  Weight: 88.9 kg 86.7 kg  Examination:  General exam: Appears calm and comfortable.  Dry buccal mucosa. Respiratory system: Clear to auscultation.  Cardiovascular system: S1 & S2 heard. Gastrointestinal system: Abdomen is obese, soft and nontender. No organomegaly or masses  felt. Normal bowel sounds heard. Central nervous system: Alert and oriented.  Tremors from Parkinson's disease.  Extremities: No leg edema.  Data Reviewed: I have personally reviewed following labs and imaging studies  CBC: Recent Labs  Lab 07/27/20 1340 07/28/20 0205 07/29/20 0327  WBC 11.5* 8.1 5.1  NEUTROABS 9.9*  --   --   HGB 12.6 10.7* 10.8*  HCT 40.3 34.3* 33.8*  MCV 86.9 86.4 86.7  PLT 228 197 174   Basic Metabolic Panel: Recent Labs  Lab 07/27/20 1340 07/28/20 0205 07/29/20 0327  NA 138 140 142  K 4.2 3.6 3.7  CL 103 106 108  CO2 25 24 25   GLUCOSE 157* 109* 109*  BUN 17 24* 19  CREATININE 1.03* 1.10* 1.03*  CALCIUM 9.4 8.9 8.9  MG  --   --  1.7  PHOS  --   --  3.4   GFR: Estimated Creatinine Clearance: 50.3 mL/min (A) (by C-G formula based on SCr of 1.03 mg/dL (H)). Liver Function Tests: Recent Labs  Lab 07/27/20 1340 07/28/20 0205 07/29/20 0327  AST 43* 27  --   ALT 25 8  --   ALKPHOS 69 53  --   BILITOT 0.8 0.8  --   PROT 6.7 5.3*  --   ALBUMIN 3.5 2.8* 2.7*   No results for input(s): LIPASE, AMYLASE in the last 168 hours. No results for input(s): AMMONIA in the last 168 hours. Coagulation Profile: Recent Labs  Lab 07/27/20 1340  INR 1.0   Cardiac Enzymes: Recent Labs  Lab 07/27/20 1340 07/28/20 0205 07/29/20 0327  CKTOTAL 1,417* 787* 371*   BNP (last 3 results) No results for input(s): PROBNP in the last 8760 hours. HbA1C: No results for input(s): HGBA1C in the last 72 hours. CBG: Recent Labs  Lab 07/28/20 1203 07/28/20 1617 07/28/20 2126 07/29/20 0808 07/29/20 1110  GLUCAP 115* 143* 137* 104* 141*   Lipid Profile: No results for input(s): CHOL, HDL, LDLCALC, TRIG, CHOLHDL, LDLDIRECT in the last 72 hours. Thyroid Function Tests: No results for input(s): TSH, T4TOTAL, FREET4, T3FREE, THYROIDAB in the last 72 hours. Anemia Panel: No results for input(s): VITAMINB12, FOLATE, FERRITIN, TIBC, IRON, RETICCTPCT in the last 72  hours. Urine analysis:    Component Value Date/Time   COLORURINE YELLOW 07/27/2020 1700   APPEARANCEUR CLEAR 07/27/2020 1700   LABSPEC 1.016 07/27/2020 1700   PHURINE 6.0 07/27/2020 1700   GLUCOSEU 50 (A) 07/27/2020 1700   HGBUR NEGATIVE 07/27/2020 1700   HGBUR negative 11/04/2010 0921   BILIRUBINUR NEGATIVE 07/27/2020 1700   BILIRUBINUR 1+ 09/27/2018 1156   KETONESUR 5 (A) 07/27/2020 1700   PROTEINUR NEGATIVE 07/27/2020 1700   UROBILINOGEN 1.0 09/27/2018 1156   UROBILINOGEN 0.2 11/04/2010 0921   NITRITE NEGATIVE 07/27/2020 1700   LEUKOCYTESUR NEGATIVE 07/27/2020 1700   Sepsis Labs: @LABRCNTIP (procalcitonin:4,lacticidven:4)  ) Recent Results (from the past 240 hour(s))  Respiratory Panel by RT PCR (Flu A&B, Covid) - Nasopharyngeal Swab     Status: None   Collection Time: 07/27/20  4:49 PM   Specimen: Nasopharyngeal Swab  Result Value Ref Range Status   SARS Coronavirus 2 by RT PCR NEGATIVE NEGATIVE Final    Comment: (NOTE) SARS-CoV-2 target nucleic acids are NOT DETECTED.  The SARS-CoV-2 RNA is generally detectable in upper  respiratoy specimens during the acute phase of infection. The lowest concentration of SARS-CoV-2 viral copies this assay can detect is 131 copies/mL. A negative result does not preclude SARS-Cov-2 infection and should not be used as the sole basis for treatment or other patient management decisions. A negative result may occur with  improper specimen collection/handling, submission of specimen other than nasopharyngeal swab, presence of viral mutation(s) within the areas targeted by this assay, and inadequate number of viral copies (<131 copies/mL). A negative result must be combined with clinical observations, patient history, and epidemiological information. The expected result is Negative.  Fact Sheet for Patients:  https://www.moore.com/  Fact Sheet for Healthcare Providers:  https://www.young.biz/  This  test is no t yet approved or cleared by the [place] FDA and  has been authorized for detection and/or diagnosis of SARS-CoV-2 by FDA under an Emergency Use Authorization (EUA). This EUA will remain  in effect (meaning this test can be used) for the duration of the COVID-19 declaration under Section 564(b)(1) of the Act, 21 U.S.C. section 360bbb-3(b)(1), unless the authorization is terminated or revoked sooner.     Influenza A by PCR NEGATIVE NEGATIVE Final   Influenza B by PCR NEGATIVE NEGATIVE Final    Comment: (NOTE) The Xpert Xpress SARS-CoV-2/FLU/RSV assay is intended as an aid in  the diagnosis of influenza from Nasopharyngeal swab specimens and  should not be used as a sole basis for treatment. Nasal washings and  aspirates are unacceptable for Xpert Xpress SARS-CoV-2/FLU/RSV  testing.  Fact Sheet for Patients: https://www.moore.com/  Fact Sheet for Healthcare Providers: https://www.young.biz/  This test is not yet approved or cleared by the [place] FDA and  has been authorized for detection and/or diagnosis of SARS-CoV-2 by  FDA under an Emergency Use Authorization (EUA). This EUA will remain  in effect (meaning this test can be used) for the duration of the  Covid-19 declaration under Section 564(b)(1) of the Act, 21  U.S.C. section 360bbb-3(b)(1), unless the authorization is  terminated or revoked. Performed at [facility], [address] [place], [place] [address]          Radiology Studies: CT Head Wo Contrast  Result Date: 07/27/2020 CLINICAL DATA:  Patient found on the bathroom floor. Possible head trauma. EXAM: CT HEAD WITHOUT CONTRAST TECHNIQUE: Contiguous axial images were obtained from the base of the skull through the vertex without intravenous contrast. COMPARISON:  04/18/2020 FINDINGS: Brain: No evidence of acute infarction, hemorrhage, hydrocephalus, extra-axial collection or mass lesion/mass  effect. There is ventricular and sulcal enlargement consistent with mild atrophy. Patchy periventricular white matter hypoattenuation is also noted consistent with mild chronic microvascular ischemic change. These findings are stable. Vascular: No hyperdense vessel or unexpected calcification. Skull: Normal. Negative for fracture or focal lesion. Sinuses/Orbits: Globes and orbits are unremarkable. Sinuses are clear. Other: None. IMPRESSION: 1. No acute intracranial abnormalities. 2. Mild atrophy and chronic microvascular ischemic change. Stable appearance the prior head CT. Electronically Signed   By: Amie Portland M.D.   On: 07/27/2020 15:23   DG Chest Port 1 View  Result Date: 07/27/2020 CLINICAL DATA:  Pt bib ptar after being found on the floor. Ccollar in place. Pt states she did not fall. However pt was on the tile floor in the bathroom. Pt lives at home with her husband. Hx DM, afib, Parkinsons. C/o pain to R upper arm, denies chest complaints. EXAM: PORTABLE CHEST 1 VIEW COMPARISON:  05/17/2020 FINDINGS: Cardiac silhouette is normal in size. No mediastinal  or hilar masses. Clear lungs.  No convincing pleural effusion or pneumothorax. Skeletal structures are grossly intact. IMPRESSION: No active disease. Electronically Signed   By: Amie Portland M.D.   On: 07/27/2020 15:14   DG Humerus Right  Result Date: 07/27/2020 CLINICAL DATA:  Pt bib ptar after being found on the floor. Ccollar in place. Pt states she did not fall. However pt was on the tile floor in the bathroom. Pt lives at home with her husband. Hx DM, afib, Parkinsons. C/o pain to R upper arm, denies chest complaints. EXAM: RIGHT HUMERUS - 2+ VIEW COMPARISON:  None. FINDINGS: No fracture or bone lesion. Shoulder and elbow joints are normally aligned. Soft tissues are unremarkable. IMPRESSION: No fracture or dislocation. Electronically Signed   By: Amie Portland M.D.   On: 07/27/2020 15:15        Scheduled Meds:  (feeding supplement)  PROSource Plus  30 mL Oral BID BM   allopurinol  300 mg Oral Daily   apixaban  5 mg Oral BID   carbidopa-levodopa  2 tablet Oral BID   And   carbidopa-levodopa  1 tablet Oral Daily   docusate sodium  100 mg Oral BID   feeding supplement (GLUCERNA SHAKE)  237 mL Oral TID BM   insulin aspart  0-9 Units Subcutaneous TID WC   metoprolol tartrate  12.5 mg Oral BID   sodium chloride flush  3 mL Intravenous Q12H   Continuous Infusions:  fluconazole (DIFLUCAN) IV 100 mg (07/28/20 1632)   lactated ringers 150 mL/hr at 07/29/20 1304     LOS: 2 days    Time spent: 25 minutes.    Berton Mount, MD  [facility] Pager #: [phone] 7PM-7AM contact night coverage as above

## 2020-07-29 NOTE — Consult Note (Signed)
Cardiology Consultation:   Patient ID: Sherri Pratt MRN: [id number]; DOB: 09-06-1946  Admit date: 07/27/2020 Date of Consult: 07/29/2020  Primary Care Provider: [name], NP [facility] Cardiologist: Thurmon Fair, MD  [facility] Electrophysiologist:  None    Patient Profile:   Sherri Pratt is a 74 y.o. female with a hx of PAF and chronic anticoagulation, Parkinson's disease, CKD, diabetes mellitus II, hyperlipidemia, hypertension, and prior subdural hematoma who is being seen today for the evaluation of frequent falls and decision about chronic anticoagulation  at the request of Dr. Dartha Lodge..  History of Present Illness:   [name] the patient is readmitted at this time after being found down on her floor for over 12 hours.  She says she did not fall but simply got weak and sat down on the floor.  She was then unable to get up.  She did not injure herself.  She had a very similar hospitalization several weeks ago.  She and her husband who has cognitive impairment lives independently.  She has not been troubled by palpitations.  She does not believe she fainted.  She did not suffer head trauma.  No history of prior stroke but has diabetes, hypertension, atherosclerosis, is 74 years of age, and female.   CHA2DS2-VASc Score = 6  The patient's score is based upon: CHF History: 0 HTN History: 1 Diabetes History: 1 Stroke History: 0 Vascular Disease History: 1 Age Score: 2 Gender Score: 1     :[id number]}  The patient would have a yearly stroke risk of approximately 10%.  Past Medical History:  Diagnosis Date  . Anemia   . Atrial fibrillation (HCC)    chronically on Eliquis  . Chronic kidney disease   . Diabetes mellitus   . Gout   . Hyperlipidemia   . Hypertension   . Parkinson's disease [facility])     Past Surgical History:  Procedure Laterality Date  . APPENDECTOMY       Home Medications:  Prior to Admission medications   Medication Sig Start Date End  Date Taking? Authorizing Provider  acetaminophen (TYLENOL) 325 MG tablet Take 2 tablets (650 mg total) by mouth every 6 (six) hours as needed for mild pain (or Fever >/= 101). 07/10/20  Yes Rai, Ripudeep K, MD  allopurinol (ZYLOPRIM) 300 MG tablet Take 1 tablet (300 mg total) by mouth daily. 02/24/20  Yes [name], Kandee Keen, NP  carbidopa-levodopa (SINEMET IR) 25-100 MG tablet Take 2 tablets at 8:30am/2 tablets at 12:30pm/1 tablet at 4:30pm Patient taking differently: Take 1-2 tablets by mouth See admin instructions. Take 2 tablets by mouth at 8:30am,2 tablets by mouth at 12:30pm then take 1 tablet by mouth at 4:30pm 04/12/20  Yes Tat, Rebecca S, DO  ELIQUIS 5 MG TABS tablet TAKE 1 TABLET BY MOUTH TWICE A DAY Patient taking differently: Take 5 mg by mouth 2 (two) times daily.  09/20/19  Yes Croitoru, Mihai, MD  glipiZIDE (GLUCOTROL XL) 2.5 MG 24 hr tablet Take 1 tablet (2.5 mg total) by mouth daily with breakfast. 02/24/20  Yes [name], Kandee Keen, NP  metoprolol tartrate (LOPRESSOR) 25 MG tablet TAKE 1/2 TABLET BY MOUTH TWICE DAILY Patient taking differently: Take 12.5 mg by mouth 2 (two) times daily.  07/07/19  Yes Croitoru, Mihai, MD  nystatin cream (MYCOSTATIN) Apply 1 application topically in the morning and at bedtime.   Yes [provider]  simvastatin (ZOCOR) 20 MG tablet Take 1 tablet (20 mg total) by mouth at bedtime. 02/24/20  Yes  Sherri Frees, NP    Inpatient Medications: Scheduled Meds: . (feeding supplement) PROSource Plus  30 mL Oral BID BM  . allopurinol  300 mg Oral Daily  . apixaban  5 mg Oral BID  . carbidopa-levodopa  2 tablet Oral BID   And  . carbidopa-levodopa  1 tablet Oral Daily  . docusate sodium  100 mg Oral BID  . feeding supplement (GLUCERNA SHAKE)  237 mL Oral TID BM  . insulin aspart  0-9 Units Subcutaneous TID WC  . metoprolol tartrate  12.5 mg Oral BID  . sodium chloride flush  3 mL Intravenous Q12H   Continuous Infusions: . fluconazole (DIFLUCAN) IV 100 mg  (07/28/20 1632)  . lactated ringers     PRN Meds: acetaminophen **OR** acetaminophen, bisacodyl, hydrALAZINE, HYDROcodone-acetaminophen, morphine injection, ondansetron **OR** ondansetron (ZOFRAN) IV, polyethylene glycol  Allergies:    Allergies  Allergen Reactions  . Peanut-Containing Drug Products     unknown    Social History:   Social History   Socioeconomic History  . Marital status: Married    Spouse name: Not on file  . Number of children: Not on file  . Years of education: Not on file  . Highest education level: Not on file  Occupational History  . Not on file  Tobacco Use  . Smoking status: Never Smoker  . Smokeless tobacco: Never Used  Substance and Sexual Activity  . Alcohol use: No  . Drug use: No  . Sexual activity: Not on file  Other Topics Concern  . Not on file  Social History Narrative   Retired - She Forensic psychologist   Married for 51 years    Three children - all in [place]      She likes to quilting and shopping.    Social Determinants of Health   Financial Resource Strain:   . Difficulty of Paying Living Expenses: Not on file  Food Insecurity:   . Worried About Programme researcher, broadcasting/film/video in the Last Year: Not on file  . Ran Out of Food in the Last Year: Not on file  Transportation Needs:   . Lack of Transportation (Medical): Not on file  . Lack of Transportation (Non-Medical): Not on file  Physical Activity:   . Days of Exercise per Week: Not on file  . Minutes of Exercise per Session: Not on file  Stress:   . Feeling of Stress : Not on file  Social Connections:   . Frequency of Communication with Friends and Family: Not on file  . Frequency of Social Gatherings with Friends and Family: Not on file  . Attends Religious Services: Not on file  . Active Member of Clubs or Organizations: Not on file  . Attends Banker Meetings: Not on file  . Marital Status: Not on file  Intimate Partner Violence:   . Fear of Current or Ex-Partner: Not on  file  . Emotionally Abused: Not on file  . Physically Abused: Not on file  . Sexually Abused: Not on file    Family History:    Family History  Problem Relation Age of Onset  . Cancer Mother        colon  . Hypertension Mother   . Cancer Father        colon  . Healthy Sister   . Cancer Brother   . Healthy Child      ROS:  Please see the history of present illness.  She understands that perhaps she will be  living in a facility either skilled nursing or assisted living with her husband.  She does not have a feeling 1 way or the other concerning discontinuation or continuing Eliquis.  She does point out that she hit her head when she had the fall prior to her last admission.  This was in her bathtub.  On this occasion she did not fall but sat down on the floor because she started feeling weak and could not make it any further.  She could not get up after that. All other ROS reviewed and negative.     Physical Exam/Data:   Vitals:   07/28/20 1926 07/29/20 0258 07/29/20 1050 07/29/20 1109  BP: (!) 117/54 (!) 152/77 (!) 142/74   Pulse: 66 60 60   Resp: Temp: 98.7 F (37.1 C) (!) 97.4 F (36.3 C)  98 F (36.7 C)  TempSrc: Oral   Oral  SpO2: 96% 97% 96%   Weight:  86.7 kg    Height:        Intake/Output Summary (Last 24 hours) at 07/29/2020 1534 Last data filed at 07/29/2020 1254 Gross per 24 hour  Intake 1437 ml  Output 4350 ml  Net -2913 ml   Last 3 Weights 07/29/2020 07/28/2020 07/06/2020  Weight (lbs) 191 lb 3.2 oz 196 lb 180 lb  Weight (kg) 86.728 kg 88.905 kg 81.647 kg     Body mass index is 32.82 kg/m.  General: Obese, well developed, in no acute distress HEENT: normal Lymph: no adenopathy Neck: no JVD Endocrine:  No thryomegaly Vascular: No carotid bruits; FA pulses 2+ bilaterally without bruits  Cardiac:  normal S1, S2; RRR; with right upper sternal systolic murmur.  No diastolic murmur. Lungs:  clear to auscultation bilaterally, no wheezing,  rhonchi or rales  Abd: soft, nontender, no hepatomegaly  Ext: no edema Musculoskeletal:  No deformities, BUE and BLE strength normal and equal Skin: warm and dry  Neuro: Tremor, gross and somewhat violent in left arm.  Conversant.  Oriented x3.  Tremor left arm much greater than right. Psych: Surprisingly normal affect  EKG:  The EKG was personally reviewed and demonstrates: Sinus rhythm.  Artifact from her tremor suggests the possibility of atrial flutter but I do not believe this is the case.  No acute ST-T wave changes noted. Telemetry:  Telemetry was personally reviewed and demonstrates: Normal sinus rhythm  Relevant CV Studies: Echocardiogram 2018: Study Conclusions   - Left ventricle: The cavity size was normal. There was mild  concentric hypertrophy. Systolic function was normal. The  estimated ejection fraction was in the range of 55% to 60%. Wall  motion was normal; there were no regional wall motion  abnormalities. Left ventricular diastolic function parameters  were normal.  - Aortic valve: Valve area (VTI): 1.76 cm^2. Valve area (Vmax):  1.75 cm^2. Valve area (Vmean): 1.62 cm^2.  - Left atrium: The atrium was mildly dilated.  - Pulmonary arteries: PA peak pressure: 32 mm Hg (S).   Laboratory Data:  High Sensitivity Troponin:   Recent Labs  Lab 07/27/20 1340 07/27/20 1600  TROPONINIHS 19* 18*     Chemistry Recent Labs  Lab 07/27/20 1340 07/28/20 0205 07/29/20 0327  NA 138 140 142  K 4.2 3.6 3.7  CL 103 106 108  CO2 GLUCOSE 157* 109* 109*  BUN 17 24* 19  CREATININE 1.03* 1.10* 1.03*  CALCIUM 9.4 8.9 8.9  GFRNONAA 53* 49* 53*  ANIONGAP 10 10 9  Recent Labs  Lab 07/27/20 1340 07/28/20 0205 07/29/20 0327  PROT 6.7 5.3*  --   ALBUMIN 3.5 2.8* 2.7*  AST 43* 27  --   ALT 25 8  --   ALKPHOS 69 53  --   BILITOT 0.8 0.8  --    Hematology Recent Labs  Lab 07/27/20 1340 07/28/20 0205 07/29/20 0327  WBC 11.5* 8.1 5.1  RBC  4.64 3.97 3.90  HGB 12.6 10.7* 10.8*  HCT 40.3 34.3* 33.8*  MCV 86.9 86.4 86.7  MCH 27.2 27.0 27.7  MCHC 31.3 31.2 32.0  RDW 15.0 15.2 15.0  PLT 228 197 174   BNP Recent Labs  Lab 07/27/20 1340  BNP 297.7*    DDimer No results for input(s): DDIMER in the last 168 hours.   Radiology/Studies:  CT Head Wo Contrast  Result Date: 07/27/2020 CLINICAL DATA:  Patient found on the bathroom floor. Possible head trauma. EXAM: CT HEAD WITHOUT CONTRAST TECHNIQUE: Contiguous axial images were obtained from the base of the skull through the vertex without intravenous contrast. COMPARISON:  04/18/2020 FINDINGS: Brain: No evidence of acute infarction, hemorrhage, hydrocephalus, extra-axial collection or mass lesion/mass effect. There is ventricular and sulcal enlargement consistent with mild atrophy. Patchy periventricular white matter hypoattenuation is also noted consistent with mild chronic microvascular ischemic change. These findings are stable. Vascular: No hyperdense vessel or unexpected calcification. Skull: Normal. Negative for fracture or focal lesion. Sinuses/Orbits: Globes and orbits are unremarkable. Sinuses are clear. Other: None. IMPRESSION: 1. No acute intracranial abnormalities. 2. Mild atrophy and chronic microvascular ischemic change. Stable appearance the prior head CT. Electronically Signed   By: Amie Portland M.D.   On: 07/27/2020 15:23   DG Chest Port 1 View  Result Date: 07/27/2020 CLINICAL DATA:  Pt bib ptar after being found on the floor. Ccollar in place. Pt states she did not fall. However pt was on the tile floor in the bathroom. Pt lives at home with her husband. Hx DM, afib, Parkinsons. C/o pain to R upper arm, denies chest complaints. EXAM: PORTABLE CHEST 1 VIEW COMPARISON:  05/17/2020 FINDINGS: Cardiac silhouette is normal in size. No mediastinal or hilar masses. Clear lungs.  No convincing pleural effusion or pneumothorax. Skeletal structures are grossly intact.  IMPRESSION: No active disease. Electronically Signed   By: Amie Portland M.D.   On: 07/27/2020 15:14   DG Humerus Right  Result Date: 07/27/2020 CLINICAL DATA:  Pt bib ptar after being found on the floor. Ccollar in place. Pt states she did not fall. However pt was on the tile floor in the bathroom. Pt lives at home with her husband. Hx DM, afib, Parkinsons. C/o pain to R upper arm, denies chest complaints. EXAM: RIGHT HUMERUS - 2+ VIEW COMPARISON:  None. FINDINGS: No fracture or bone lesion. Shoulder and elbow joints are normally aligned. Soft tissues are unremarkable. IMPRESSION: No fracture or dislocation. Electronically Signed   By: Amie Portland M.D.   On: 07/27/2020 15:15     Assessment and Plan:   1. Previously documented history of atrial fibrillation.  Her Chads Vasc score is 6, placing her at greater than or equal to 9% yearly risk of embolic CVA.  Cognitively, she is doing well.  There is legitimate concern about the possibility of injury due to gait difficulty in view of 2 recent hospitalizations after being found down at home.  The prior episode was associated with head trauma but no significant brain injury.  This will require shared decision making.  The patient does not have an opinion about whether she should continue or not.  If she ends up living in a skilled nursing facility, I would continue anticoagulation therapy.  If she is having to care for herself and has continued exposure to falls, I would recommend discontinuation.  Her primary cardiologist, Dr. Rachelle HoraMihai Croitoru should be allowed to weigh in on final decision.  It may be that this is not possible until after the next appointment with him.   :[id number]} CHA2DS2-VASc Score = 6  This indicates a 9.7% annual risk of stroke. The patient's score is based upon: CHF History: 0 HTN History: 1 Diabetes History: 1 Stroke History: 0 Vascular Disease History: 1 Age Score: 2 Gender Score: 1     [facility] will sign off.    Medication Recommendations:  No change Other recommendations (labs, testing, etc):  none Follow up as an outpatient:  Virtual with Dr. [name] to discuss DC or continuation of Eliquis.  For questions or updates, please contact [facility] Please consult [email or web address] for contact info under    Signed, Lesleigh NoeHenry W Ellianna Ruest III, MD  07/29/2020 3:34 PM

## 2020-07-29 NOTE — NC FL2 (Signed)
[place] MEDICAID FL2 LEVEL OF CARE SCREENING TOOL     IDENTIFICATION  Patient Name: Sherri Pratt Birthdate: September 05, 1946 Sex: female Admission Date (Current Location): 07/27/2020  [facility] and [place] Number:  Producer, television/film/video and Address:  [facility] [employer], [address], [place], [place] [address]      Provider Number: [id number]  Attending Physician Name and Address:  Barnetta Chapel, MD  Relative Name and Phone Number:  Barbara Cower [phone]    Current Level of Care: Hospital Recommended Level of Care: Skilled Nursing Facility Prior Approval Number:    Date Approved/Denied:   PASRR Number: [id number]  Discharge Plan: SNF    Current Diagnoses: Patient Active Problem List   Diagnosis Date Noted  . Palliative care by specialist   . Goals of care, counseling/discussion   . Fall   . Gait instability   . AKI (acute kidney injury) (HCC) 07/06/2020  . Traumatic rhabdomyolysis (HCC) 07/06/2020  . PAF (paroxysmal atrial fibrillation) (HCC) 07/06/2020  . DM2 (diabetes mellitus, type 2) (HCC) 07/06/2020  . HTN (hypertension) 07/06/2020  . Parkinson's disease (HCC) 07/06/2020  . Pressure injury of skin 04/20/2020  . Sepsis (HCC) 04/19/2020  . Severe sepsis (HCC) 04/18/2020  . CAP (community acquired pneumonia) 04/18/2020  . General weakness 04/18/2020  . Acute metabolic encephalopathy 04/18/2020  . Class 1 obesity due to excess calories with body mass index (BMI) of 30.0 to 30.9 in adult 11/12/2018  . Parkinson's disease (HCC) 10/19/2018  . Prolonged QT interval 10/13/2018  . Hypokalemia 10/13/2018  . SDH (subdural hematoma) (HCC) 10/13/2018  . Syncope and collapse 10/12/2018  . Type 2 diabetes mellitus with hypoglycemia without coma (HCC) 10/12/2018  . Paroxysmal atrial fibrillation (HCC) 04/17/2017  . Hypophosphatemia 04/17/2017  . Hypomagnesemia 04/17/2017  . Acute lower UTI 04/17/2017  . Hyperthyroidism 04/17/2017  . AKI (acute kidney  injury) (HCC) 04/16/2017  . Anemia 11/08/2012  . GOUT 11/12/2010  . Chronic kidney disease 03/27/2009  . Diabetes 1.5, managed as type 1 (HCC) 09/21/2007  . Mixed hyperlipidemia 09/21/2007  . Essential hypertension 09/21/2007    Orientation RESPIRATION BLADDER Height & Weight     Self, Time, Situation, Place  Normal Incontinent, External catheter Weight: 191 lb 3.2 oz (86.7 kg) Height:  5\' 4"  (162.6 cm)  BEHAVIORAL SYMPTOMS/MOOD NEUROLOGICAL BOWEL NUTRITION STATUS      Continent Diet (See discharge summary)  AMBULATORY STATUS COMMUNICATION OF NEEDS Skin   Extensive Assist Verbally  (MASD)                       Personal Care Assistance Level of Assistance  Bathing, Feeding, Dressing Bathing Assistance: Maximum assistance Feeding assistance: Limited assistance Dressing Assistance: Maximum assistance     Functional Limitations Info    Sight Info: Adequate Hearing Info: Adequate Speech Info: Adequate    SPECIAL CARE FACTORS FREQUENCY  PT (By licensed PT), OT (By licensed OT)     PT Frequency: 5x per week OT Frequency: 5x per week            Contractures Contractures Info: Not present    Additional Factors Info  Code Status Code Status Info: DNR Allergies Info: Peanut           Current Medications (07/29/2020):  This is the current hospital active medication list Current Facility-Administered Medications  Medication Dose Route Frequency Provider Last Rate Last Admin  . (feeding supplement) PROSource Plus liquid 30 mL  30 mL  Oral BID BM Berton Mount I, MD   30 mL at 07/29/20 1042  . acetaminophen (TYLENOL) tablet 650 mg  650 mg Oral Q6H PRN Jonah Blue, MD   650 mg at 07/27/20 2213   Or  . acetaminophen (TYLENOL) suppository 650 mg  650 mg Rectal Q6H PRN Jonah Blue, MD      . allopurinol (ZYLOPRIM) tablet 300 mg  300 mg Oral Daily Jonah Blue, MD   300 mg at 07/29/20 1041  . apixaban (ELIQUIS) tablet 5 mg  5 mg Oral BID Jonah Blue,  MD   5 mg at 07/29/20 1041  . bisacodyl (DULCOLAX) EC tablet 5 mg  5 mg Oral Daily PRN Jonah Blue, MD      . carbidopa-levodopa (SINEMET IR) 25-100 MG per tablet immediate release 2 tablet  2 tablet Oral BID Jonah Blue, MD   2 tablet at 07/29/20 1306   And  . carbidopa-levodopa (SINEMET IR) 25-100 MG per tablet immediate release 1 tablet  1 tablet Oral Daily Jonah Blue, MD   1 tablet at 07/28/20 1631  . docusate sodium (COLACE) capsule 100 mg  100 mg Oral BID Jonah Blue, MD   100 mg at 07/29/20 1041  . feeding supplement (GLUCERNA SHAKE) (GLUCERNA SHAKE) liquid 237 mL  237 mL Oral TID BM Berton Mount I, MD   237 mL at 07/29/20 1045  . fluconazole (DIFLUCAN) IVPB 100 mg  100 mg Intravenous Q24H Jonah Blue, MD 50 mL/hr at 07/28/20 1632 100 mg at 07/28/20 1632  . hydrALAZINE (APRESOLINE) injection 5 mg  5 mg Intravenous Q4H PRN Jonah Blue, MD      . HYDROcodone-acetaminophen (NORCO/VICODIN) 5-325 MG per tablet 1-2 tablet  1-2 tablet Oral Q4H PRN Jonah Blue, MD      . insulin aspart (novoLOG) injection 0-9 Units  0-9 Units Subcutaneous TID WC Jonah Blue, MD   1 Units at 07/29/20 1200  . lactated ringers infusion   Intravenous Continuous Berton Mount I, MD      . metoprolol tartrate (LOPRESSOR) tablet 12.5 mg  12.5 mg Oral BID Jonah Blue, MD   12.5 mg at 07/29/20 1041  . morphine 2 MG/ML injection 2 mg  2 mg Intravenous Q2H PRN Jonah Blue, MD      . ondansetron [facility]) tablet 4 mg  4 mg Oral Q6H PRN Jonah Blue, MD       Or  . ondansetron [facility]) injection 4 mg  4 mg Intravenous Q6H PRN Jonah Blue, MD      . polyethylene glycol (MIRALAX / GLYCOLAX) packet 17 g  17 g Oral Daily PRN Jonah Blue, MD      . sodium chloride flush (NS) 0.9 % injection 3 mL  3 mL Intravenous Q12H Jonah Blue, MD   3 mL at 07/28/20 2146     Discharge Medications: Please see discharge summary for a list of discharge medications.  Relevant  Imaging Results:  Relevant Lab Results:   Additional Information SSN [id number] [place], [place]

## 2020-07-30 DIAGNOSIS — Z7901 Long term (current) use of anticoagulants: Secondary | ICD-10-CM | POA: Diagnosis not present

## 2020-07-30 DIAGNOSIS — I10 Essential (primary) hypertension: Secondary | ICD-10-CM | POA: Diagnosis not present

## 2020-07-30 DIAGNOSIS — I480 Paroxysmal atrial fibrillation: Secondary | ICD-10-CM | POA: Diagnosis not present

## 2020-07-30 LAB — GLUCOSE, CAPILLARY
Glucose-Capillary: 130 mg/dL — ABNORMAL HIGH (ref 70–99)
Glucose-Capillary: 116 mg/dL — ABNORMAL HIGH (ref 70–99)
Glucose-Capillary: 121 mg/dL — ABNORMAL HIGH (ref 70–99)
Glucose-Capillary: 136 mg/dL — ABNORMAL HIGH (ref 70–99)

## 2020-07-30 MED ORDER — FLUCONAZOLE 100 MG PO TABS
100.0000 mg | ORAL_TABLET | Freq: Every day | ORAL | Status: DC
  Administered 2020-07-30 – 2020-08-02 (×4): 100 mg via ORAL
  Filled 2020-07-30 (×4): qty 1

## 2020-07-30 MED ORDER — ATORVASTATIN CALCIUM 40 MG PO TABS
40.0000 mg | ORAL_TABLET | Freq: Every day | ORAL | Status: DC
  Administered 2020-07-30 – 2020-08-02 (×4): 40 mg via ORAL
  Filled 2020-07-30 (×4): qty 1

## 2020-07-30 NOTE — Consult Note (Addendum)
WOC Nurse Consult Note: Reason for Consult: Consult requested for pannus.  Pt was previously reported to have severe cellulitis.  This appears to have improved since patient was placed on systemic Diflucan. Wound type: Abd folds with red moist partial thickness fissures related to intertrigo.  Left abd fold with dry peeling skin where previous moisture associated skin damage is drying and healing.  Dressing procedure/placement/frequency: Topical treatment orders provided for bedside nurses to perform as follows: Apply antifungal powder to pannus TID and PRN when cleaning. Please re-consult if further assistance is needed.  Thank-you,  Cammie Mcgee MSN, RN, CWOCN, [place], CNS [phone]

## 2020-07-30 NOTE — Consult Note (Addendum)
   [facility] Inpatient Consult   07/30/2020  Sherri Pratt Mar 09, 1946 [id number]   [facility] [ACO] Patient:  Sherri Pratt [facility]   Patient screened for high risk score for unplanned readmission score and for less than 30 days readmission hospitalization.  Medical record of PT/OT and [facility] staff recommendations to check for post hospital follow up needs..  Review of patient's medical record reveals patient is for a skilled nursing facility  Primary Care Provider is [name], Tommi Rumps, NP this provider is listed to provide the transition of care [TOC] for post hospital follow up.   Plan:  Patient needs to be met a SNF, if patient transitions to a facility.  Continue to follow progress and disposition to assess for post hospital care management needs.    Please place a [facility] Care Management consult as appropriate and for questions contact:   Natividad Brood, RN BSN [facility] Liaison  [phone] business mobile phone Toll free office [phone]  Fax number: [phone] [email or web address]

## 2020-07-30 NOTE — Progress Notes (Signed)
Physical Therapy Treatment Patient Details Name: Sherri Pratt MRN: [id number] DOB: April 05, 1946 Today's Date: 07/30/2020    History of Present Illness 74 y.o. female admitted with recurrent falls, rhabdo, and AKI. PMHx significant for PAF on eliquis, DM2, HTN, gout, parkinson's dz.    PT Comments    Pt agreeable to PT following lunch; pt performed bed mobility with S with increased time and HOB elevated, pt very surprised she didn't need help; pt performed standing and balance activities requiring min A and RW to maintain balance; pt with increased confusion at end of session with MD discussing medication changes, following education regarding just changing time of medication pt agreeable to discuss further with neurology; pt continues to demonstrate deficits in balance, strength, coordination, gait, endurance and safety and will benefit from skilled PT to address deficits to maximize independence with functional mobility prior to discharge.    Follow Up Recommendations  SNF     Equipment Recommendations  None recommended by PT    Recommendations for Other Services       Precautions / Restrictions Precautions Precautions: Fall Precaution Comments: Recurrent falls without injury Restrictions Weight Bearing Restrictions: No    Mobility  Bed Mobility Overal bed mobility: Needs Assistance Bed Mobility: Supine to Sit;Sit to Supine     Supine to sit: Supervision;HOB elevated Sit to supine: Supervision   General bed mobility comments: pt requiring increased time and use of bedrails as well as HOB elevated  Transfers Overall transfer level: Needs assistance Equipment used: None Transfers: Sit to/from Stand Sit to Stand: Min guard;From elevated surface         General transfer comment: performed 5 reps of sit<>stand from EOB with pt performing with min A  Ambulation/Gait                 Stairs             Wheelchair Mobility    Modified Rankin (Stroke  Patients Only)       Balance               Standing balance comment: performed marching in place 3 reps of 10 with RW and min guard from therapist; pt given cueing to decrease UE support to challenge balance, verbal cueing also given to increase hip flexion to increase ROM                            Cognition Arousal/Alertness: Awake/alert Behavior During Therapy: WFL for tasks assessed/performed Overall Cognitive Status: Within Functional Limits for tasks assessed                                        Exercises General Exercises - Lower Extremity Heel Slides: AROM;Both;20 reps;Supine Hip ABduction/ADduction: AROM;Both;20 reps;Supine Straight Leg Raises: AROM;Both;20 reps;Supine    General Comments General comments (skin integrity, edema, etc.): MD Ogbata present at end of session to discuss medication with pt. MD requesting PT to ensure pt understood what he was saying; MD was educating pt on change of medication timing to improve function of medication to reduce falls, PT explained MD wants to change what time of day you would take meds, pt verbalized understanding and agreed to discuss further with neurology      Pertinent Vitals/Pain Pain Assessment: No/denies pain    Home Living  Prior Function            PT Goals (current goals can now be found in the care plan section) Acute Rehab PT Goals Patient Stated Goal: Go home today PT Goal Formulation: With patient/family Time For Goal Achievement: 08/11/20 Potential to Achieve Goals: Good Progress towards PT goals: Progressing toward goals    Frequency    Min 3X/week      PT Plan Current plan remains appropriate    Co-evaluation              AM-PAC PT "6 Clicks" Mobility   Outcome Measure  Help needed turning from your back to your side while in a flat bed without using bedrails?: A Little Help needed moving from lying on your back to  sitting on the side of a flat bed without using bedrails?: A Lot Help needed moving to and from a bed to a chair (including a wheelchair)?: A Little Help needed standing up from a chair using your arms (e.g., wheelchair or bedside chair)?: A Little Help needed to walk in hospital room?: A Little Help needed climbing 3-5 steps with a railing? : A Lot 6 Click Score: 16    End of Session Equipment Utilized During Treatment: Gait belt Activity Tolerance: Patient tolerated treatment well Patient left: in bed;with call bell/phone within reach;with bed alarm set Nurse Communication: Mobility status PT Visit Diagnosis: Unsteadiness on feet (R26.81);Other abnormalities of gait and mobility (R26.89);Repeated falls (R29.6);Muscle weakness (generalized) (M62.81);History of falling (Z91.81);Pain     Time: 7793-9030 PT Time Calculation (min) (ACUTE ONLY): 20 min  Charges:  $Therapeutic Exercise: 8-22 mins                     Ginette Otto, DPT Acute Rehabilitation Services [phone]   Lucretia Field 07/30/2020, 2:05 PM

## 2020-07-30 NOTE — Progress Notes (Signed)
Progress Note  Patient Name: Sherri Pratt Date of Encounter: 07/30/2020  [facility] Cardiologist: Thurmon Fair, MD   Subjective   No CV complaints. A very brief episode of paroxysmal atrial fibrillation was recorded this AM, also a period of ventricular bigeminy.  Inpatient Medications    Scheduled Meds:  (feeding supplement) PROSource Plus  30 mL Oral BID BM   allopurinol  300 mg Oral Daily   apixaban  5 mg Oral BID   carbidopa-levodopa  2 tablet Oral BID   And   carbidopa-levodopa  1 tablet Oral Daily   docusate sodium  100 mg Oral BID   feeding supplement (GLUCERNA SHAKE)  237 mL Oral TID BM   insulin aspart  0-9 Units Subcutaneous TID WC   metoprolol tartrate  12.5 mg Oral BID   sodium chloride flush  3 mL Intravenous Q12H   Continuous Infusions:  fluconazole (DIFLUCAN) IV Stopped (07/29/20 1831)   lactated ringers 50 mL/hr at 07/29/20 1553   PRN Meds: acetaminophen **OR** acetaminophen, bisacodyl, hydrALAZINE, HYDROcodone-acetaminophen, morphine injection, ondansetron **OR** ondansetron (ZOFRAN) IV, polyethylene glycol   Vital Signs    Vitals:   07/29/20 1050 07/29/20 1109 07/29/20 2020 07/30/20 0500  BP: (!) 142/74  (!) 143/63   Pulse: 60  (!) 58   Resp: 18  20   Temp:  98 F (36.7 C) 98.2 F (36.8 C)   TempSrc:  Oral Oral   SpO2: 96%  97%   Weight:    88 kg  Height:        Intake/Output Summary (Last 24 hours) at 07/30/2020 0909 Last data filed at 07/30/2020 0735 Gross per 24 hour  Intake 3056.3 ml  Output 4050 ml  Net -993.7 ml   Last 3 Weights 07/30/2020 07/29/2020 07/28/2020  Weight (lbs) 194 lb 191 lb 3.2 oz 196 lb  Weight (kg) 87.998 kg 86.728 kg 88.905 kg      Telemetry    Brief nonsustained atrial fibrillation, ventricular bigeminy, mostly NSR - Personally Reviewed  ECG    NSR, "flutter" due to parkinsonian tremor artifact - Personally Reviewed  Physical Exam  Obese GEN: No acute distress.   Neck: No  JVD Cardiac: RRR, no murmurs, rubs, or gallops.  Respiratory: Clear to auscultation bilaterally. GI: Soft, nontender, non-distended  MS: No edema; No deformity. Neuro:  somewhat "frozen" expression, prominent resting tremor L hand. OTW nonfocal.  Psych: Normal affect   Labs    High Sensitivity Troponin:   Recent Labs  Lab 07/27/20 1340 07/27/20 1600  TROPONINIHS 19* 18*      Chemistry Recent Labs  Lab 07/27/20 1340 07/28/20 0205 07/29/20 0327  NA 138 140 142  K 4.2 3.6 3.7  CL 103 106 108  CO2 25 24 25   GLUCOSE 157* 109* 109*  BUN 17 24* 19  CREATININE 1.03* 1.10* 1.03*  CALCIUM 9.4 8.9 8.9  PROT 6.7 5.3*  --   ALBUMIN 3.5 2.8* 2.7*  AST 43* 27  --   ALT 25 8  --   ALKPHOS 69 53  --   BILITOT 0.8 0.8  --   GFRNONAA 53* 49* 53*  ANIONGAP 10 10 9      Hematology Recent Labs  Lab 07/27/20 1340 07/28/20 0205 07/29/20 0327  WBC 11.5* 8.1 5.1  RBC 4.64 3.97 3.90  HGB 12.6 10.7* 10.8*  HCT 40.3 34.3* 33.8*  MCV 86.9 86.4 86.7  MCH 27.2 27.0 27.7  MCHC 31.3 31.2 32.0  RDW 15.0 15.2 15.0  PLT 228 197 174    BNP Recent Labs  Lab 07/27/20 1340  BNP 297.7*     DDimer No results for input(s): DDIMER in the last 168 hours.   Radiology    No results found.  Cardiac Studies   Echocardiogram 2018: Study Conclusions   - Left ventricle: The cavity size was normal. There was mild  concentric hypertrophy. Systolic function was normal. The  estimated ejection fraction was in the range of 55% to 60%. Wall  motion was normal; there were no regional wall motion  abnormalities. Left ventricular diastolic function parameters  were normal.  - Aortic valve: Valve area (VTI): 1.76 cm^2. Valve area (Vmax):  1.75 cm^2. Valve area (Vmean): 1.62 cm^2.  - Left atrium: The atrium was mildly dilated.  - Pulmonary arteries: PA peak pressure: 32 mm Hg (S).    Patient Profile     74 y.o. female with asymptomatic paroxysmal atrial fibrillation, DM, HTN,  no significant structural heart disease, gait instability and falls, Parkinson's disease.  Assessment & Plan    1. AFib: CHADSVasc 5-6  (age 23, gender, HTN, DM, +/- atherosclerotic calcifications on CT). Increased embolic risk, bu also with recurrent falls/injury. Plans to move to a residential facility, where she will have more assistance, hopefully fewer falls. If that occurs, would continue Eliquis. If she has to go home, would temporarily stop anticoagulation until the necessary changes in home arrangements are in place. A brief interruption in Eliquis (say for a few weeks or a couple of months) would be associated with with a 1-2% risk of embolic stroke. The risk of catastrophic intracranial bleeding is probably higher than that in the short term. 2. HTN: allow SBP in 140s. She has a very low DBP and likely orthostatic hypotension due to her age and neurological condition. 3. DM: good glycemic control. 4. HLP: LDL close to target <70 on statin. Consider switching to atorvastatin 40 mg daily at DC. CK was increased due to mechanical muscle injury. 5. PVC bigeminy: asymptomatic. On low dose beta blocker.     For questions or updates, please contact [facility] Please consult [email or web address] for contact info under        Signed, Thurmon Fair, MD  07/30/2020, 9:09 AM

## 2020-07-30 NOTE — Progress Notes (Signed)
PROGRESS NOTE    Sherri Pratt  YKZ:[id number] DOB: 19-Mar-1946 DOA: 07/27/2020 PCP: [name], NP  Outpatient Specialists:   Brief Narrative:  Patient is a 74 year old Caucasian female past medical history significant for advanced Parkinson's disease, atrial fibrillation on Eliquis, diabetes mellitus and hypertension.  Patient was recently admitted to the hospital (from 07/06/2020 to 07/10/2020) with rhabdomyolysis and AKI following a fall.  Patient was discharged to skilled nursing facility for rehab and was eventually discharged back home 2 days prior to re-presentation.  Patient presented following a fall.  Patient seemed volume depleted on presentation.  07/29/2020: Patient was seen alongside patient's son and husband.  Patient's husband has advanced dementia and depends on patient.  Patient looks better today.  Physical therapy team is recommended skilled nursing facility placement.  Will consult social work team.  We also consulted neurology and cardiology to assist with patient's management.  We need cardiology to advise safety of continuing Eliquis, considering recurrent falls.  Hopefully, neurology will help with adjusting patient's Parkinson's disease medications.  Further management depend on hospital course.  07/30/2020: Patient seen. Patient continues to improve. Cardiology input is appreciated. Input from neurology team is appreciated. Will change current Sinemet regimen to 50/200 at 8 AM and 12 noon, and 25/100 at 7 PM. Hopefully, neurology team will continue to follow patient and adjust Parkinson's medication as deemed necessary. Optimization of patient's Parkinson's disease will help prevent recurrent falls.  Assessment & Plan:   Principal Problem:   Traumatic rhabdomyolysis (HCC) Active Problems:   Mixed hyperlipidemia   AKI (acute kidney injury) (HCC)   Parkinson's disease (HCC)   Class 1 obesity due to excess calories with body mass index (BMI) of 30.0 to 30.9 in adult    PAF (paroxysmal atrial fibrillation) (HCC)   DM2 (diabetes mellitus, type 2) (HCC)   HTN (hypertension)   Palliative care by specialist   Goals of care, counseling/discussion   Chronic anticoagulation  Traumatic rhabdomyolysis -Elevated CPK, likely secondary to mechanical fall in the setting of Parkinson's disease -CPK has continued to fall. -CPK level today is 371.   -Basically identical presentation 3 weeks ago with rehab stay in the interim -Decrease IV fluids.   -Continue to monitor renal function and electrolytes.   Volume depletion:  -Resolved significantly.      Candidal panniculitis -Continue Diflucan   PAF (paroxysmal atrial fibrillation)  -Rate controlled with Lopressor -Currently on Eliquis. -Consulted cardiology team to advise safety of continuing Eliquis.    DM2 (diabetes mellitus, type 2)  -Continue sliding scale insulin.  HTN (hypertension) -Continue Lopressor  Parkinson's disease -Patient continues to have tremors.   -Continue Sinement -Neurology consulted to assist with Parkinson's disease medication adjustment. -PT has recommended skilled nursing facility placement. -Palliative care input is appreciated.   07/30/2020: Neurology input is appreciated. Sinemet will be changed to 50/200 at 8 AM on 12 noon, and 25/100 at 7 PM. Neurology is also considering adding a fourth dose.  Obesity -BMI 30.9  DVT prophylaxis: Eliquis. Code Status: DO NOT RESUSCITATE. Family Communication:  Disposition Plan: This will depend on hospital course.   Consultants:   Palliative care team.  Cardiology.  Neurology.    Procedures:   None  Antimicrobials:   None   Subjective: No new complaints. Patient is not particularly good historian. Looks better today.  Objective: Vitals:   07/29/20 2020 07/30/20 0500 07/30/20 0943 07/30/20 1150  BP: (!) 143/63  125/65 (!) 149/63  Pulse: (!) 58  60 (!) 54  Resp: 20     Temp: 98.2 F (36.8 C)  98.5 F  (36.9 C) 98.8 F (37.1 C)  TempSrc: Oral  Oral Oral  SpO2: 97%  94% 94%  Weight:  88 kg    Height:        Intake/Output Summary (Last 24 hours) at 07/30/2020 1808 Last data filed at 07/30/2020 0735 Gross per 24 hour  Intake 703.75 ml  Output 2600 ml  Net -1896.25 ml   Filed Weights   07/28/20 0500 07/29/20 0258 07/30/20 0500  Weight: 88.9 kg 86.7 kg 88 kg    Examination:  General exam: Appears calm and comfortable.  Dry buccal mucosa. Respiratory system: Clear to auscultation.  Cardiovascular system: S1 & S2 heard. Gastrointestinal system: Abdomen is obese, soft and nontender. No organomegaly or masses felt. Normal bowel sounds heard. Central nervous system: Alert and oriented.  Tremors from Parkinson's disease.  Extremities: No leg edema.  Data Reviewed: I have personally reviewed following labs and imaging studies  CBC: Recent Labs  Lab 07/27/20 1340 07/28/20 0205 07/29/20 0327  WBC 11.5* 8.1 5.1  NEUTROABS 9.9*  --   --   HGB 12.6 10.7* 10.8*  HCT 40.3 34.3* 33.8*  MCV 86.9 86.4 86.7  PLT 228 197 174   Basic Metabolic Panel: Recent Labs  Lab 07/27/20 1340 07/28/20 0205 07/29/20 0327  NA 138 140 142  K 4.2 3.6 3.7  CL 103 106 108  CO2 25 24 25   GLUCOSE 157* 109* 109*  BUN 17 24* 19  CREATININE 1.03* 1.10* 1.03*  CALCIUM 9.4 8.9 8.9  MG  --   --  1.7  PHOS  --   --  3.4   GFR: Estimated Creatinine Clearance: 50.7 mL/min (A) (by C-G formula based on SCr of 1.03 mg/dL (H)). Liver Function Tests: Recent Labs  Lab 07/27/20 1340 07/28/20 0205 07/29/20 0327  AST 43* 27  --   ALT 25 8  --   ALKPHOS 69 53  --   BILITOT 0.8 0.8  --   PROT 6.7 5.3*  --   ALBUMIN 3.5 2.8* 2.7*   No results for input(s): LIPASE, AMYLASE in the last 168 hours. No results for input(s): AMMONIA in the last 168 hours. Coagulation Profile: Recent Labs  Lab 07/27/20 1340  INR 1.0   Cardiac Enzymes: Recent Labs  Lab 07/27/20 1340 07/28/20 0205 07/29/20 0327    CKTOTAL 1,417* 787* 371*   BNP (last 3 results) No results for input(s): PROBNP in the last 8760 hours. HbA1C: No results for input(s): HGBA1C in the last 72 hours. CBG: Recent Labs  Lab 07/29/20 1110 07/29/20 1621 07/30/20 0753 07/30/20 1151 07/30/20 1627  GLUCAP 141* 166* 130* 116* 121*   Lipid Profile: No results for input(s): CHOL, HDL, LDLCALC, TRIG, CHOLHDL, LDLDIRECT in the last 72 hours. Thyroid Function Tests: No results for input(s): TSH, T4TOTAL, FREET4, T3FREE, THYROIDAB in the last 72 hours. Anemia Panel: No results for input(s): VITAMINB12, FOLATE, FERRITIN, TIBC, IRON, RETICCTPCT in the last 72 hours. Urine analysis:    Component Value Date/Time   COLORURINE YELLOW 07/27/2020 1700   APPEARANCEUR CLEAR 07/27/2020 1700   LABSPEC 1.016 07/27/2020 1700   PHURINE 6.0 07/27/2020 1700   GLUCOSEU 50 (A) 07/27/2020 1700   HGBUR NEGATIVE 07/27/2020 1700   HGBUR negative 11/04/2010 0921   BILIRUBINUR NEGATIVE 07/27/2020 1700   BILIRUBINUR 1+ 09/27/2018 1156   KETONESUR 5 (A) 07/27/2020 1700   PROTEINUR NEGATIVE 07/27/2020 1700   UROBILINOGEN 1.0 09/27/2018  1156   UROBILINOGEN 0.2 11/04/2010 0921   NITRITE NEGATIVE 07/27/2020 1700   LEUKOCYTESUR NEGATIVE 07/27/2020 1700   Sepsis Labs: @LABRCNTIP (procalcitonin:4,lacticidven:4)  ) Recent Results (from the past 240 hour(s))  Respiratory Panel by RT PCR (Flu A&B, Covid) - Nasopharyngeal Swab     Status: None   Collection Time: 07/27/20  4:49 PM   Specimen: Nasopharyngeal Swab  Result Value Ref Range Status   SARS Coronavirus 2 by RT PCR NEGATIVE NEGATIVE Final    Comment: (NOTE) SARS-CoV-2 target nucleic acids are NOT DETECTED.  The SARS-CoV-2 RNA is generally detectable in upper respiratoy specimens during the acute phase of infection. The lowest concentration of SARS-CoV-2 viral copies this assay can detect is 131 copies/mL. A negative result does not preclude SARS-Cov-2 infection and should not be used as  the sole basis for treatment or other patient management decisions. A negative result may occur with  improper specimen collection/handling, submission of specimen other than nasopharyngeal swab, presence of viral mutation(s) within the areas targeted by this assay, and inadequate number of viral copies (<131 copies/mL). A negative result must be combined with clinical observations, patient history, and epidemiological information. The expected result is Negative.  Fact Sheet for Patients:  07/29/20  Fact Sheet for Healthcare Providers:  https://www.moore.com/  This test is no t yet approved or cleared by the https://www.young.biz/ FDA and  has been authorized for detection and/or diagnosis of SARS-CoV-2 by FDA under an Emergency Use Authorization (EUA). This EUA will remain  in effect (meaning this test can be used) for the duration of the COVID-19 declaration under Section 564(b)(1) of the Act, 21 U.S.C. section 360bbb-3(b)(1), unless the authorization is terminated or revoked sooner.     Influenza A by PCR NEGATIVE NEGATIVE Final   Influenza B by PCR NEGATIVE NEGATIVE Final    Comment: (NOTE) The Xpert Xpress SARS-CoV-2/FLU/RSV assay is intended as an aid in  the diagnosis of influenza from Nasopharyngeal swab specimens and  should not be used as a sole basis for treatment. Nasal washings and  aspirates are unacceptable for Xpert Xpress SARS-CoV-2/FLU/RSV  testing.  Fact Sheet for Patients: [place]  Fact Sheet for Healthcare Providers: https://www.moore.com/  This test is not yet approved or cleared by the https://www.young.biz/ FDA and  has been authorized for detection and/or diagnosis of SARS-CoV-2 by  FDA under an Emergency Use Authorization (EUA). This EUA will remain  in effect (meaning this test can be used) for the duration of the  Covid-19 declaration under Section 564(b)(1)  of the Act, 21  U.S.C. section 360bbb-3(b)(1), unless the authorization is  terminated or revoked. Performed at [facility], [address], [place], [place]          Radiology Studies: No results found.      Scheduled Meds:  (feeding supplement) PROSource Plus  30 mL Oral BID BM   allopurinol  300 mg Oral Daily   apixaban  5 mg Oral BID   atorvastatin  40 mg Oral Daily   carbidopa-levodopa  2 tablet Oral BID   And   carbidopa-levodopa  1 tablet Oral Daily   docusate sodium  100 mg Oral BID   feeding supplement (GLUCERNA SHAKE)  237 mL Oral TID BM   fluconazole  100 mg Oral Daily   insulin aspart  0-9 Units Subcutaneous TID WC   metoprolol tartrate  12.5 mg Oral BID   sodium chloride flush  3 mL Intravenous Q12H   Continuous Infusions:  LOS: 3 days    Time spent: 25 minutes.    Berton MountSylvester Kura Bethards, MD  [facility] Pager #: [phone] 7PM-7AM contact night coverage as above

## 2020-07-31 DIAGNOSIS — I480 Paroxysmal atrial fibrillation: Secondary | ICD-10-CM | POA: Diagnosis not present

## 2020-07-31 DIAGNOSIS — I10 Essential (primary) hypertension: Secondary | ICD-10-CM | POA: Diagnosis not present

## 2020-07-31 DIAGNOSIS — Z7901 Long term (current) use of anticoagulants: Secondary | ICD-10-CM | POA: Diagnosis not present

## 2020-07-31 LAB — SARS CORONAVIRUS 2 BY RT PCR (HOSPITAL ORDER, PERFORMED IN ~~LOC~~ HOSPITAL LAB): SARS Coronavirus 2: NEGATIVE

## 2020-07-31 LAB — GLUCOSE, CAPILLARY
Glucose-Capillary: 117 mg/dL — ABNORMAL HIGH (ref 70–99)
Glucose-Capillary: 147 mg/dL — ABNORMAL HIGH (ref 70–99)
Glucose-Capillary: 154 mg/dL — ABNORMAL HIGH (ref 70–99)
Glucose-Capillary: 189 mg/dL — ABNORMAL HIGH (ref 70–99)

## 2020-07-31 NOTE — Progress Notes (Signed)
This chaplain responded to spiritual care consult for notarizing of Pt. Advance Directive.  The Pt. prepared her Advance Directive.  The chaplain asked questions around the understanding of the purpose of HCPOA and Living Will.  The chaplain understands the Pt. desires to complete the document at this time.    The Pt. named Daina Cara as her Healthcare Agent and Lakeidra Reliford as the agent if the healthcare agent is unable or unwilling to serve.  The chaplain gave the Pt. the original and two copies of the Advance Directive.  A copy of the AD was scanned to EMR.  F/U spiritual care is available as needed.

## 2020-07-31 NOTE — Progress Notes (Signed)
PROGRESS NOTE        PATIENT DETAILS Name: Sherri Pratt Age: 74 y.o. Sex: female Date of Birth: Jul 17, 1946 Admit Date: 07/27/2020 Admitting Physician Jonah Blue, MD JOI:[facility], Kandee Keen, NP  Brief Narrative: Patient is a 74 y.o. female Parkinson's disease, atrial fibrillation on Eliquis, DM-two, HTN-recently hospitalized from 9/24-9/28 due to AKI from rhabdomyolysis-presented to the hospital following a fall-found to have recurrent rhabdomyolysis.  See below for further details.    Subjective: Lying comfortably in bed-no major issues overnight.  Assessment/Plan: Rhabdomyolysis: Secondary to fall-CK downtrending with supportive care.    Frequent falls: Suspect related to autonomic dysfunction due to Parkinson's disease.  Difficult situation-really no good options-continue supportive care-PT eval completed-recommending SNF.  PAF: Sinus rhythm this morning-appreciate cardiology input-if she discharged to SNF-where she can have more supervision-Eliquis to be continued, however if she discharges home-may be reasonable to temporarily stop anticoagulation until necessary home arrangements are in place for closer supervision.  HTN: Allow some amount of permissive hypertension-given risk of orthostatic hypotension in the setting of Parkinson's disease.  Continue with low-dose metoprolol.  DM-2: CBG stable-continue SSI.  Recent Labs    07/30/20 2140 07/31/20 0608 07/31/20 1156  GLUCAP 136* 117* 147*   Parkinson's disease: Neurology consult appreciated-continue Sinemet per neurology.  Obesity: Estimated body mass index is 33.3 kg/m as calculated from the following:   Height as of this encounter: 5\' 4"  (1.626 m).   Weight as of this encounter: 88 kg.   Consults: Cardiology, neurology  DVT Prophylaxis : apixaban (ELIQUIS) tablet 5 mg    Diet: Diet Order            Diet Carb Modified Fluid consistency: Thin; Room service appropriate? Yes  Diet  effective now                  Code Status:  DNR  Family Communication: Spoke with son-Jeff-over phone [phone]) on 10/19  Disposition Plan: Status is: Inpatient  Remains inpatient appropriate because:Inpatient level of care appropriate due to severity of illness  Dispo: The patient is from: Home              Anticipated d/c is to: SNF              Anticipated d/c date is: 2 days              Patient currently is not medically stable to d/c.  Barriers to Discharge: None  Antimicrobial agents: Anti-infectives (From admission, onward)   Start     Dose/Rate Route Frequency Ordered Stop   07/30/20 1230  fluconazole (DIFLUCAN) tablet 100 mg        100 mg Oral Daily 07/30/20 1128 08/06/20 0959   07/27/20 1800  fluconazole (DIFLUCAN) IVPB 100 mg  Status:  Discontinued        100 mg 50 mL/hr over 60 Minutes Intravenous Every 24 hours 07/27/20 1646 07/30/20 1128       Time spent: 25- minutes-Greater than 50% of this time was spent in counseling, explanation of diagnosis, planning of further management, and coordination of care.  MEDICATIONS: Scheduled Meds: . (feeding supplement) PROSource Plus  30 mL Oral BID BM  . allopurinol  300 mg Oral Daily  . apixaban  5 mg Oral BID  . atorvastatin  40 mg Oral Daily  . carbidopa-levodopa  2 tablet Oral  BID   And  . carbidopa-levodopa  1 tablet Oral Daily  . docusate sodium  100 mg Oral BID  . feeding supplement (GLUCERNA SHAKE)  237 mL Oral TID BM  . fluconazole  100 mg Oral Daily  . insulin aspart  0-9 Units Subcutaneous TID WC  . metoprolol tartrate  12.5 mg Oral BID  . sodium chloride flush  3 mL Intravenous Q12H   Continuous Infusions: PRN Meds:.acetaminophen **OR** acetaminophen, bisacodyl, hydrALAZINE, HYDROcodone-acetaminophen, morphine injection, ondansetron **OR** ondansetron (ZOFRAN) IV, polyethylene glycol   PHYSICAL EXAM: Vital signs: Vitals:   07/30/20 2034 07/31/20 0438 07/31/20 0957 07/31/20 1155  BP:  (!) 150/72 (!) 150/62 (!) 150/122 120/64  Pulse: 72 (!) 56 72 63  Resp: 17 17  20   Temp: 98.3 F (36.8 C) 97.7 F (36.5 C)  98 F (36.7 C)  TempSrc: Oral Oral  Oral  SpO2: 98% 100% 98% 95%  Weight:  88 kg    Height:       Filed Weights   07/29/20 0258 07/30/20 0500 07/31/20 0438  Weight: 86.7 kg 88 kg 88 kg   Body mass index is 33.3 kg/m.   Gen Exam:Alert awake-not in any distress HEENT:atraumatic, normocephalic Chest: B/L clear to auscultation anteriorly CVS:S1S2 regular Abdomen:soft non tender, non distended Extremities:no edema Neurology: Non focal Skin: no rash  I have personally reviewed following labs and imaging studies  LABORATORY DATA: CBC: Recent Labs  Lab 07/27/20 1340 07/28/20 0205 07/29/20 0327  WBC 11.5* 8.1 5.1  NEUTROABS 9.9*  --   --   HGB 12.6 10.7* 10.8*  HCT 40.3 34.3* 33.8*  MCV 86.9 86.4 86.7  PLT 228 197 174    Basic Metabolic Panel: Recent Labs  Lab 07/27/20 1340 07/28/20 0205 07/29/20 0327  NA 138 140 142  K 4.2 3.6 3.7  CL 103 106 108  CO2 25 24 25   GLUCOSE 157* 109* 109*  BUN 17 24* 19  CREATININE 1.03* 1.10* 1.03*  CALCIUM 9.4 8.9 8.9  MG  --   --  1.7  PHOS  --   --  3.4    GFR: Estimated Creatinine Clearance: 50.7 mL/min (A) (by C-G formula based on SCr of 1.03 mg/dL (H)).  Liver Function Tests: Recent Labs  Lab 07/27/20 1340 07/28/20 0205 07/29/20 0327  AST 43* 27  --   ALT 25 8  --   ALKPHOS 69 53  --   BILITOT 0.8 0.8  --   PROT 6.7 5.3*  --   ALBUMIN 3.5 2.8* 2.7*   No results for input(s): LIPASE, AMYLASE in the last 168 hours. No results for input(s): AMMONIA in the last 168 hours.  Coagulation Profile: Recent Labs  Lab 07/27/20 1340  INR 1.0    Cardiac Enzymes: Recent Labs  Lab 07/27/20 1340 07/28/20 0205 07/29/20 0327  CKTOTAL 1,417* 787* 371*    BNP (last 3 results) No results for input(s): PROBNP in the last 8760 hours.  Lipid Profile: No results for input(s): CHOL, HDL,  LDLCALC, TRIG, CHOLHDL, LDLDIRECT in the last 72 hours.  Thyroid Function Tests: No results for input(s): TSH, T4TOTAL, FREET4, T3FREE, THYROIDAB in the last 72 hours.  Anemia Panel: No results for input(s): VITAMINB12, FOLATE, FERRITIN, TIBC, IRON, RETICCTPCT in the last 72 hours.  Urine analysis:    Component Value Date/Time   COLORURINE YELLOW 07/27/2020 1700   APPEARANCEUR CLEAR 07/27/2020 1700   LABSPEC 1.016 07/27/2020 1700   PHURINE 6.0 07/27/2020 1700   GLUCOSEU 50 (A)  07/27/2020 1700   HGBUR NEGATIVE 07/27/2020 1700   HGBUR negative 11/04/2010 0921   BILIRUBINUR NEGATIVE 07/27/2020 1700   BILIRUBINUR 1+ 09/27/2018 1156   KETONESUR 5 (A) 07/27/2020 1700   PROTEINUR NEGATIVE 07/27/2020 1700   UROBILINOGEN 1.0 09/27/2018 1156   UROBILINOGEN 0.2 11/04/2010 0921   NITRITE NEGATIVE 07/27/2020 1700   LEUKOCYTESUR NEGATIVE 07/27/2020 1700    Sepsis Labs: Lactic Acid, Venous    Component Value Date/Time   LATICACIDVEN 2.0 (HH) 07/27/2020 1544    MICROBIOLOGY: Recent Results (from the past 240 hour(s))  Respiratory Panel by RT PCR (Flu A&B, Covid) - Nasopharyngeal Swab     Status: None   Collection Time: 07/27/20  4:49 PM   Specimen: Nasopharyngeal Swab  Result Value Ref Range Status   SARS Coronavirus 2 by RT PCR NEGATIVE NEGATIVE Final    Comment: (NOTE) SARS-CoV-2 target nucleic acids are NOT DETECTED.  The SARS-CoV-2 RNA is generally detectable in upper respiratoy specimens during the acute phase of infection. The lowest concentration of SARS-CoV-2 viral copies this assay can detect is 131 copies/mL. A negative result does not preclude SARS-Cov-2 infection and should not be used as the sole basis for treatment or other patient management decisions. A negative result may occur with  improper specimen collection/handling, submission of specimen other than nasopharyngeal swab, presence of viral mutation(s) within the areas targeted by this assay, and inadequate  number of viral copies (<131 copies/mL). A negative result must be combined with clinical observations, patient history, and epidemiological information. The expected result is Negative.  Fact Sheet for Patients:  https://www.moore.com/  Fact Sheet for Healthcare Providers:  https://www.young.biz/  This test is no t yet approved or cleared by the [place] FDA and  has been authorized for detection and/or diagnosis of SARS-CoV-2 by FDA under an Emergency Use Authorization (EUA). This EUA will remain  in effect (meaning this test can be used) for the duration of the COVID-19 declaration under Section 564(b)(1) of the Act, 21 U.S.C. section 360bbb-3(b)(1), unless the authorization is terminated or revoked sooner.     Influenza A by PCR NEGATIVE NEGATIVE Final   Influenza B by PCR NEGATIVE NEGATIVE Final    Comment: (NOTE) The Xpert Xpress SARS-CoV-2/FLU/RSV assay is intended as an aid in  the diagnosis of influenza from Nasopharyngeal swab specimens and  should not be used as a sole basis for treatment. Nasal washings and  aspirates are unacceptable for Xpert Xpress SARS-CoV-2/FLU/RSV  testing.  Fact Sheet for Patients: https://www.moore.com/  Fact Sheet for Healthcare Providers: https://www.young.biz/  This test is not yet approved or cleared by the [place] FDA and  has been authorized for detection and/or diagnosis of SARS-CoV-2 by  FDA under an Emergency Use Authorization (EUA). This EUA will remain  in effect (meaning this test can be used) for the duration of the  Covid-19 declaration under Section 564(b)(1) of the Act, 21  U.S.C. section 360bbb-3(b)(1), unless the authorization is  terminated or revoked. Performed at [facility], [address], [place], [place] [address]     RADIOLOGY STUDIES/RESULTS: No results found.   LOS: 4 days   Jeoffrey Massed, MD  [facility]    To contact the attending provider between 7A-7P or the covering provider during after hours 7P-7A, please log into the web site [email or web address] and access using universal [place] password for that web site. If you do not have the password, please call the hospital operator.  07/31/2020, 2:48 PM

## 2020-07-31 NOTE — Plan of Care (Signed)

## 2020-07-31 NOTE — Progress Notes (Signed)
Occupational Therapy Treatment Patient Details Name: Sherri Pratt MRN: [id number] DOB: 1946-02-18 Today's Date: 07/31/2020    History of present illness 74 y.o. female admitted with recurrent falls, rhabdo, and AKI. PMHx significant for PAF on eliquis, DM2, HTN, gout, parkinson's dz.   OT comments  Pt progressing towards acute OT goals. Focus of session was bed mobility, toilet transfers and pericare. D/c plan updated to SNF for rehab at time of d/c.    Follow Up Recommendations  SNF    Equipment Recommendations  Other (comment) (Defer to next level of care)    Recommendations for Other Services      Precautions / Restrictions Precautions Precautions: Fall Precaution Comments: Recurrent falls without injury Restrictions Weight Bearing Restrictions: No       Mobility Bed Mobility Overal bed mobility: Needs Assistance Bed Mobility: Supine to Sit;Sit to Supine     Supine to sit: Mod assist;HOB elevated Sit to supine: Mod assist   General bed mobility comments: Assist to powerup trunk and pivot to full EOB position  Transfers Overall transfer level: Needs assistance Equipment used: Rolling walker (2 wheeled) Transfers: Sit to/from [employer] Sit to Stand: Mod assist Stand pivot transfers: Min assist       General transfer comment: EOB<>BSC. Assist to steady, powerup and initiate.    Balance Overall balance assessment: Needs assistance Sitting-balance support: Single extremity supported;Feet supported Sitting balance-Leahy Scale: Fair Sitting balance - Comments: min A to steady at times   Standing balance support: Bilateral upper extremity supported Standing balance-Leahy Scale: Poor                             ADL either performed or assessed with clinical judgement   ADL Overall ADL's : Needs assistance/impaired                         Toilet Transfer: Minimal assistance;RW;Stand-pivot;BSC Toilet Transfer Details  (indicate cue type and reason): BSC utilized 2/2 urgency to void and not having Parkinsons med yet thismorning Toileting- Clothing Manipulation and Hygiene: Moderate assistance;Sit to/from stand Toileting - Clothing Manipulation Details (indicate cue type and reason): pt stood with rw, totalA for pericare task       General ADL Comments: Pt completed bed mobility, SPT EOB<>BSC, and pericare     Vision       Perception     Praxis      Cognition Arousal/Alertness: Awake/alert Behavior During Therapy: Flat affect;WFL for tasks assessed/performed Overall Cognitive Status: Within Functional Limits for tasks assessed                                          Exercises     Shoulder Instructions       General Comments      Pertinent Vitals/ Pain       Pain Assessment: Faces Faces Pain Scale: Hurts little more Pain Location: generalized, back, L hip "from how I'm stuck in the bed" Pain Descriptors / Indicators: Aching;Sore Pain Intervention(s): Monitored during session;Repositioned  Home Living                                          Prior Functioning/Environment  Frequency  Min 2X/week        Progress Toward Goals  OT Goals(current goals can now be found in the care plan section)  Progress towards OT goals: Progressing toward goals  Acute Rehab OT Goals Patient Stated Goal: home OT Goal Formulation: With patient/family Time For Goal Achievement: 08/11/20 Potential to Achieve Goals: Fair ADL Goals Pt Will Perform Grooming: with set-up;standing Pt Will Perform Lower Body Bathing: sit to/from stand;with min guard assist Pt Will Perform Upper Body Dressing: with set-up;sitting Pt Will Perform Lower Body Dressing: with min guard assist;sit to/from stand Pt Will Transfer to Toilet: with supervision;bedside commode Pt Will Perform Toileting - Clothing Manipulation and hygiene: with min guard assist;sit to/from  stand Pt/caregiver will Perform Home Exercise Program: Increased strength;Both right and left upper extremity;With written HEP provided  Plan Discharge plan needs to be updated    Co-evaluation                 AM-PAC OT "6 Clicks" Daily Activity     Outcome Measure   Help from another person eating meals?: A Little Help from another person taking care of personal grooming?: A Little Help from another person toileting, which includes using toliet, bedpan, or urinal?: A Lot Help from another person bathing (including washing, rinsing, drying)?: A Lot Help from another person to put on and taking off regular upper body clothing?: A Little Help from another person to put on and taking off regular lower body clothing?: A Lot 6 Click Score: 15    End of Session Equipment Utilized During Treatment: Gait belt;Rolling walker  OT Visit Diagnosis: Unsteadiness on feet (R26.81);Repeated falls (R29.6);Muscle weakness (generalized) (M62.81);Pain   Activity Tolerance Patient tolerated treatment well   Patient Left in bed;with call bell/phone within reach;with bed alarm set;with nursing/sitter in room   Nurse Communication Other (comment);Mobility status (blood noted during pericare and on side of bed)        Time: 0926-1000 OT Time Calculation (min): 34 min  Charges: OT General Charges $OT Visit: 1 Visit OT Treatments $Self Care/Home Management : 23-37 mins  Raynald Kemp, OT Acute Rehabilitation Services Pager: [phone] Office: [phone]    Pilar Grammes 07/31/2020, 11:11 AM

## 2020-07-31 NOTE — Progress Notes (Signed)
Physical Therapy Treatment Patient Details Name: Sherri Pratt MRN: [id number] DOB: 04/02/1946 Today's Date: 07/31/2020    History of Present Illness 74 y.o. female admitted with recurrent falls, rhabdo, and AKI. PMHx significant for PAF on eliquis, DM2, HTN, gout, parkinson's dz.    PT Comments    Pt with improved bed mobility during session; pt with requiring increased time sitting EOB with LE movement to engage muscle to safely perform sit<>stand; pt continues to demonstrate deficits in balance, strength and gait increasing risk of falling; pt will benefit from skilled PT to address deficits to maximize independence with functional mobility prior to discharge. Continue to recommend SNF for additional rehab    Follow Up Recommendations  SNF     Equipment Recommendations  None recommended by PT    Recommendations for Other Services       Precautions / Restrictions Precautions Precautions: Fall Precaution Comments: Recurrent falls without injury Restrictions Weight Bearing Restrictions: No    Mobility  Bed Mobility Overal bed mobility: Needs Assistance Bed Mobility: Supine to Sit     Supine to sit: Min assist Sit to supine: Mod assist   General bed mobility comments: Assist to powerup trunk and pivot to full EOB position  Transfers Overall transfer level: Needs assistance Equipment used: Rolling walker (2 wheeled) Transfers: Sit to/from Stand Sit to Stand: Mod assist;Min assist Stand pivot transfers: Min assist       General transfer comment: pt performed sit<>stand from EOB and requiring mod A for initial trial and unable to complete due to increased posterior lean; PT recommended sitting EOB and performed 10 LAQ and 10 marches to engage muscles; following pt able to perform sit<>Stand multiple times from EOB and chair with min A, performed multiple reps of sit<>stand for pregait activity  Ambulation/Gait Ambulation/Gait assistance: Min guard Gait Distance  (Feet): 52 Feet Assistive device: Rolling walker (2 wheeled) Gait Pattern/deviations: Step-through pattern;Decreased stride length;Trunk flexed         Stairs             Wheelchair Mobility    Modified Rankin (Stroke Patients Only)       Balance Overall balance assessment: Needs assistance Sitting-balance support: Single extremity supported;Feet supported Sitting balance-Leahy Scale: Fair Sitting balance - Comments: min A to steady at times   Standing balance support: Bilateral upper extremity supported Standing balance-Leahy Scale: Poor                              Cognition Arousal/Alertness: Awake/alert Behavior During Therapy: Flat affect;WFL for tasks assessed/performed Overall Cognitive Status: Within Functional Limits for tasks assessed                                        Exercises      General Comments        Pertinent Vitals/Pain Pain Assessment: Faces Faces Pain Scale: Hurts little more Pain Location: stated her bottom hurt; repositioned to chair Pain Descriptors / Indicators: Aching;Sore Pain Intervention(s): Monitored during session;Repositioned    Home Living                      Prior Function            PT Goals (current goals can now be found in the care plan section) Acute Rehab PT Goals Patient Stated Goal: home  PT Goal Formulation: With patient/family Time For Goal Achievement: 08/11/20 Potential to Achieve Goals: Good Progress towards PT goals: Progressing toward goals    Frequency    Min 2X/week      PT Plan Frequency needs to be updated    Co-evaluation              AM-PAC PT "6 Clicks" Mobility   Outcome Measure  Help needed turning from your back to your side while in a flat bed without using bedrails?: A Little Help needed moving from lying on your back to sitting on the side of a flat bed without using bedrails?: A Little Help needed moving to and from a bed to  a chair (including a wheelchair)?: A Little Help needed standing up from a chair using your arms (e.g., wheelchair or bedside chair)?: A Little Help needed to walk in hospital room?: A Little Help needed climbing 3-5 steps with a railing? : A Lot 6 Click Score: 17    End of Session Equipment Utilized During Treatment: Gait belt Activity Tolerance: Patient tolerated treatment well Patient left: in chair;with chair alarm set;with call bell/phone within reach Nurse Communication: Mobility status PT Visit Diagnosis: Unsteadiness on feet (R26.81);Other abnormalities of gait and mobility (R26.89);Repeated falls (R29.6);Muscle weakness (generalized) (M62.81);History of falling (Z91.81);Pain     Time: 3825-0539 PT Time Calculation (min) (ACUTE ONLY): 16 min  Charges:  $Gait Training: 8-22 mins                     Sherri Pratt, DPT Acute Rehabilitation Services [phone]   Sherri Pratt 07/31/2020, 12:38 PM

## 2020-07-31 NOTE — Progress Notes (Signed)
Progress Note  Patient Name: Sherri Pratt Date of Encounter: 07/31/2020  [facility] Cardiologist: Thurmon Fair, MD   Subjective   No CV complaints, rested well. Her husband (has dementia) was admitted to the hospital in [place] with altered mental status.  Inpatient Medications    Scheduled Meds: . (feeding supplement) PROSource Plus  30 mL Oral BID BM  . allopurinol  300 mg Oral Daily  . apixaban  5 mg Oral BID  . atorvastatin  40 mg Oral Daily  . carbidopa-levodopa  2 tablet Oral BID   And  . carbidopa-levodopa  1 tablet Oral Daily  . docusate sodium  100 mg Oral BID  . feeding supplement (GLUCERNA SHAKE)  237 mL Oral TID BM  . fluconazole  100 mg Oral Daily  . insulin aspart  0-9 Units Subcutaneous TID WC  . metoprolol tartrate  12.5 mg Oral BID  . sodium chloride flush  3 mL Intravenous Q12H   Continuous Infusions:  PRN Meds: acetaminophen **OR** acetaminophen, bisacodyl, hydrALAZINE, HYDROcodone-acetaminophen, morphine injection, ondansetron **OR** ondansetron (ZOFRAN) IV, polyethylene glycol   Vital Signs    Vitals:   07/30/20 0943 07/30/20 1150 07/30/20 2034 07/31/20 0438  BP: 125/65 (!) 149/63 (!) 150/72 (!) 150/62  Pulse: 60 (!) 54 72 (!) 56  Resp:   17 17  Temp: 98.5 F (36.9 C) 98.8 F (37.1 C) 98.3 F (36.8 C) 97.7 F (36.5 C)  TempSrc: Oral Oral Oral Oral  SpO2: 94% 94% 98% 100%  Weight:    88 kg  Height:        Intake/Output Summary (Last 24 hours) at 07/31/2020 0922 Last data filed at 07/31/2020 9147 Gross per 24 hour  Intake 363 ml  Output 900 ml  Net -537 ml   Last 3 Weights 07/31/2020 07/30/2020 07/29/2020  Weight (lbs) 194 lb 194 lb 191 lb 3.2 oz  Weight (kg) 87.998 kg 87.998 kg 86.728 kg      Telemetry    NSR, one 15 beat episode of paroxysmal atrial tachycardia. Occ PVCs. - Personally Reviewed  ECG    No new tracing - Personally Reviewed  Physical Exam  Appears well GEN: No acute distress.   Neck: No  JVD Cardiac: RRR, no murmurs, rubs, or gallops.  Respiratory: Clear to auscultation bilaterally. GI: Soft, nontender, non-distended  MS: No edema; No deformity. Neuro:  Nonfocal  Psych: Normal affect   Labs    High Sensitivity Troponin:   Recent Labs  Lab 07/27/20 1340 07/27/20 1600  TROPONINIHS 19* 18*      Chemistry Recent Labs  Lab 07/27/20 1340 07/28/20 0205 07/29/20 0327  NA 138 140 142  K 4.2 3.6 3.7  CL 103 106 108  CO2 25 24 25   GLUCOSE 157* 109* 109*  BUN 17 24* 19  CREATININE 1.03* 1.10* 1.03*  CALCIUM 9.4 8.9 8.9  PROT 6.7 5.3*  --   ALBUMIN 3.5 2.8* 2.7*  AST 43* 27  --   ALT 25 8  --   ALKPHOS 69 53  --   BILITOT 0.8 0.8  --   GFRNONAA 53* 49* 53*  ANIONGAP 10 10 9      Hematology Recent Labs  Lab 07/27/20 1340 07/28/20 0205 07/29/20 0327  WBC 11.5* 8.1 5.1  RBC 4.64 3.97 3.90  HGB 12.6 10.7* 10.8*  HCT 40.3 34.3* 33.8*  MCV 86.9 86.4 86.7  MCH 27.2 27.0 27.7  MCHC 31.3 31.2 32.0  RDW 15.0 15.2 15.0  PLT 228 197  174    BNP Recent Labs  Lab 07/27/20 1340  BNP 297.7*     DDimer No results for input(s): DDIMER in the last 168 hours.   Radiology    No results found.  Cardiac Studies   Echocardiogram 2018: Study Conclusions   - Left ventricle: The cavity size was normal. There was mild  concentric hypertrophy. Systolic function was normal. The  estimated ejection fraction was in the range of 55% to 60%. Wall  motion was normal; there were no regional wall motion  abnormalities. Left ventricular diastolic function parameters  were normal.  - Aortic valve: Valve area (VTI): 1.76 cm^2. Valve area (Vmax):  1.75 cm^2. Valve area (Vmean): 1.62 cm^2.  - Left atrium: The atrium was mildly dilated.  - Pulmonary arteries: PA peak pressure: 32 mm Hg (S).    Patient Profile     74 y.o. female with asymptomatic paroxysmal atrial fibrillation, DM, HTN, no significant structural heart disease, gait instability and falls,  Parkinson's disease.  Assessment & Plan    1. AFib:  If she goes home would DC the anticoagulation due to risk of falls. If she changes to residential living, can restart Eliquis. CHADSVasc 5-6  (age 41, gender, HTN, DM, +/- atherosclerotic calcifications on CT). Increased embolic risk, bu also with recurrent falls/injury.  A brief interruption in Eliquis (say for a few weeks or a couple of months) would be associated with with a 1-2% risk of embolic stroke. The risk of catastrophic intracranial bleeding is probably higher than that in the short term. 2. HTN: Acceptable control. Allow SBP in 140s. She has a very low DBP and likely orthostatic hypotension due to her age and neurological condition. 3. DM: good glycemic control. 4. HLP: LDL close to target <70 on statin. Changed to atorvastatin 40 mg daily. CK was increased due to mechanical muscle injury. 5. PVC bigeminy: asymptomatic. On low dose beta blocker.         For questions or updates, please contact [facility] Please consult [email or web address] for contact info under        Signed, Thurmon Fair, MD  07/31/2020, 9:22 AM

## 2020-08-01 DIAGNOSIS — Z794 Long term (current) use of insulin: Secondary | ICD-10-CM

## 2020-08-01 DIAGNOSIS — E1100 Type 2 diabetes mellitus with hyperosmolarity without nonketotic hyperglycemic-hyperosmolar coma (NKHHC): Secondary | ICD-10-CM

## 2020-08-01 DIAGNOSIS — G909 Disorder of the autonomic nervous system, unspecified: Secondary | ICD-10-CM

## 2020-08-01 DIAGNOSIS — Z6830 Body mass index (BMI) 30.0-30.9, adult: Secondary | ICD-10-CM

## 2020-08-01 DIAGNOSIS — E6609 Other obesity due to excess calories: Secondary | ICD-10-CM

## 2020-08-01 LAB — GLUCOSE, CAPILLARY
Glucose-Capillary: 127 mg/dL — ABNORMAL HIGH (ref 70–99)
Glucose-Capillary: 151 mg/dL — ABNORMAL HIGH (ref 70–99)
Glucose-Capillary: 151 mg/dL — ABNORMAL HIGH (ref 70–99)
Glucose-Capillary: 180 mg/dL — ABNORMAL HIGH (ref 70–99)

## 2020-08-01 NOTE — Plan of Care (Signed)
  Problem: Education: Goal: Knowledge of General Education information will improve Description: Including pain rating scale, medication(s)/side effects and non-pharmacologic comfort measures Outcome: Progressing   Problem: Health Behavior/Discharge Planning: Goal: Ability to manage health-related needs will improve Outcome: Progressing   Problem: Clinical Measurements: Goal: Ability to maintain clinical measurements within normal limits will improve Outcome: Progressing Goal: Will remain free from infection Outcome: Progressing   Problem: Activity: Goal: Risk for activity intolerance will decrease Outcome: Progressing   Problem: Pain Managment: Goal: General experience of comfort will improve Outcome: Progressing   Problem: Safety: Goal: Ability to remain free from injury will improve Outcome: Progressing   Problem: Skin Integrity: Goal: Risk for impaired skin integrity will decrease Outcome: Progressing   

## 2020-08-01 NOTE — Progress Notes (Signed)
[facility] PROGRESS NOTE    Progress Note  Sherri Pratt  WUJ:[id number] DOB: 10-15-1945 DOA: 07/27/2020 PCP: [name], NP     Brief Narrative:   Sherri Pratt is an 74 y.o. female past medical history of Parkinson's disease chronic atrial fibrillation on Eliquis diabetes mellitus type 2 recently discharged from the hospital on 07/10/2020 for acute kidney injury from rhabdomyolysis comes into the hospital was found on the floor in the ED she was found rhabdomyolysis again  Assessment/Plan:   Traumatic rhabdomyolysis (HCC) 30 falls continues to trend continue supportive care.  Frequent recurrent falls: Suspect autonomic dysfunction due to Parkinson's disease. PT evaluated the patient recommended skilled nursing facility unfortunately the patient is only 5 days left from skilled. Social worker working on placement.  Paroxysmal atrial fibrillation: If she is discharged to skilled where she is supervised and she can be discharged with Eliquis unfortunately if she is discharged home it is unreasonable to discharge her on anticoagulation and recommend stopping it. Was discussed with the family.  Essential hypertension: Permissive hypertension due to the risk of orthostasis in the setting of Parkinson's disease continue low-dose metoprolol.  Diabetes mellitus type 2: Cont SSI.[name]  Parkinson's disease: Continue Sinemet per neurology.  Sacral decubitus ulcer stage II present on admission RN Pressure Injury Documentation: Pressure Injury 04/20/20 Right Stage 2 -  Partial thickness loss of dermis presenting as a shallow open injury with a red, pink wound bed without slough. Stage 2 pressure injuries to middle back, right buttock, right hip, right posterior thigh, right inn (Active)  04/20/20   Location:   Location Orientation: Right  Staging: Stage 2 -  Partial thickness loss of dermis presenting as a shallow open injury with a red, pink wound bed without slough.  Wound  Description (Comments): Stage 2 pressure injuries to middle back, right buttock, right hip, right posterior thigh, right inner thigh/hip  Present on Admission: Yes     DVT prophylaxis: eliquis Family Communication:none Status is: Inpatient  Remains inpatient appropriate because:Hemodynamically unstable   Dispo: The patient is from: Home              Anticipated d/c is to: Home              Anticipated d/c date is: 3 days              Patient currently is not medically stable to d/c.        Code Status:     Code Status Orders  (From admission, onward)         Start     Ordered   07/27/20 1645  Do not attempt resuscitation (DNR)  Continuous       Question Answer Comment  In the event of cardiac or respiratory ARREST Do not call a "code blue"   In the event of cardiac or respiratory ARREST Do not perform Intubation, CPR, defibrillation or ACLS   In the event of cardiac or respiratory ARREST Use medication by any route, position, wound care, and other measures to relive pain and suffering. May use oxygen, suction and manual treatment of airway obstruction as needed for comfort.      07/27/20 1646        Code Status History    Date Active Date Inactive Code Status Order ID Comments User Context   07/06/2020 2341 07/11/2020 0135 Full Code [id number]  Hillary Bow, DO ED   04/18/2020 2227 04/24/2020 2227 Full Code [id number]  Howerter, Jill Alexanders  B, DO ED   10/12/2018 1942 10/15/2018 1943 Full Code [id number]  Clydie Braun, MD ED   04/15/2017 0420 04/17/2017 2230 Full Code [id number]  Eduard Clos, MD ED   Advance Care Planning Activity        IV Access:    Peripheral IV   Procedures and diagnostic studies:   No results found.   Medical Consultants:    None.  Anti-Infectives:   none  Subjective:    Sherri Pratt she relates her breathing is a lot better than when she came in.  Objective:    Vitals:   08/01/20 0514 08/01/20 0807 08/01/20 0807  08/01/20 1211  BP: 122/81 (!) 142/64 (!) 142/64 115/69  Pulse: (!) 55 63 62 62  Resp: 18   18  Temp: 98 F (36.7 C)   98 F (36.7 C)  TempSrc: Oral   Oral  SpO2: 95%  96% 95%  Weight: 87.4 kg     Height:       SpO2: 95 %   Intake/Output Summary (Last 24 hours) at 08/01/2020 1221 Last data filed at 08/01/2020 1033 Gross per 24 hour  Intake 1274 ml  Output 1650 ml  Net -376 ml   Filed Weights   07/30/20 0500 07/31/20 0438 08/01/20 0514  Weight: 88 kg 88 kg 87.4 kg    Exam: General exam: In no acute distress. Respiratory system: Good air movement and clear to auscultation. Cardiovascular system: S1 & S2 heard, RRR. No JVD. Gastrointestinal system: Abdomen is nondistended, soft and nontender.  Extremities: No pedal edema. Skin: No rashes, lesions or ulcers  Data Reviewed:    Labs: Basic Metabolic Panel: Recent Labs  Lab 07/27/20 1340 07/27/20 1340 07/28/20 0205 07/29/20 0327  NA 138  --  140 142  K 4.2   < > 3.6 3.7  CL 103  --  106 108  CO2 25  --  24 25  GLUCOSE 157*  --  109* 109*  BUN 17  --  24* 19  CREATININE 1.03*  --  1.10* 1.03*  CALCIUM 9.4  --  8.9 8.9  MG  --   --   --  1.7  PHOS  --   --   --  3.4   < > = values in this interval not displayed.   GFR Estimated Creatinine Clearance: 50.5 mL/min (A) (by C-G formula based on SCr of 1.03 mg/dL (H)). Liver Function Tests: Recent Labs  Lab 07/27/20 1340 07/28/20 0205 07/29/20 0327  AST 43* 27  --   ALT 25 8  --   ALKPHOS 69 53  --   BILITOT 0.8 0.8  --   PROT 6.7 5.3*  --   ALBUMIN 3.5 2.8* 2.7*   No results for input(s): LIPASE, AMYLASE in the last 168 hours. No results for input(s): AMMONIA in the last 168 hours. Coagulation profile Recent Labs  Lab 07/27/20 1340  INR 1.0   COVID-19 Labs  No results for input(s): DDIMER, FERRITIN, LDH, CRP in the last 72 hours.  Lab Results  Component Value Date   SARSCOV2NAA NEGATIVE 07/31/2020   SARSCOV2NAA NEGATIVE 07/27/2020    SARSCOV2NAA NEGATIVE 07/09/2020   SARSCOV2NAA NEGATIVE 07/06/2020    CBC: Recent Labs  Lab 07/27/20 1340 07/28/20 0205 07/29/20 0327  WBC 11.5* 8.1 5.1  NEUTROABS 9.9*  --   --   HGB 12.6 10.7* 10.8*  HCT 40.3 34.3* 33.8*  MCV 86.9 86.4 86.7  PLT 228 197 174  Cardiac Enzymes: Recent Labs  Lab 07/27/20 1340 07/28/20 0205 07/29/20 0327  CKTOTAL 1,417* 787* 371*   BNP (last 3 results) No results for input(s): PROBNP in the last 8760 hours. CBG: Recent Labs  Lab 07/31/20 1156 07/31/20 1639 07/31/20 2108 08/01/20 0625 08/01/20 1210  GLUCAP 147* 154* 189* 127* 151*   D-Dimer: No results for input(s): DDIMER in the last 72 hours. Hgb A1c: No results for input(s): HGBA1C in the last 72 hours. Lipid Profile: No results for input(s): CHOL, HDL, LDLCALC, TRIG, CHOLHDL, LDLDIRECT in the last 72 hours. Thyroid function studies: No results for input(s): TSH, T4TOTAL, T3FREE, THYROIDAB in the last 72 hours.  Invalid input(s): FREET3 Anemia work up: No results for input(s): VITAMINB12, FOLATE, FERRITIN, TIBC, IRON, RETICCTPCT in the last 72 hours. Sepsis Labs: Recent Labs  Lab 07/27/20 1340 07/27/20 1544 07/28/20 0205 07/29/20 0327  WBC 11.5*  --  8.1 5.1  LATICACIDVEN 2.6* 2.0*  --   --    Microbiology Recent Results (from the past 240 hour(s))  Respiratory Panel by RT PCR (Flu A&B, Covid) - Nasopharyngeal Swab     Status: None   Collection Time: 07/27/20  4:49 PM   Specimen: Nasopharyngeal Swab  Result Value Ref Range Status   SARS Coronavirus 2 by RT PCR NEGATIVE NEGATIVE Final    Comment: (NOTE) SARS-CoV-2 target nucleic acids are NOT DETECTED.  The SARS-CoV-2 RNA is generally detectable in upper respiratoy specimens during the acute phase of infection. The lowest concentration of SARS-CoV-2 viral copies this assay can detect is 131 copies/mL. A negative result does not preclude SARS-Cov-2 infection and should not be used as the sole basis for treatment  or other patient management decisions. A negative result may occur with  improper specimen collection/handling, submission of specimen other than nasopharyngeal swab, presence of viral mutation(s) within the areas targeted by this assay, and inadequate number of viral copies (<131 copies/mL). A negative result must be combined with clinical observations, patient history, and epidemiological information. The expected result is Negative.  Fact Sheet for Patients:  https://www.moore.com/https://www.fda.gov/media/142436/download  Fact Sheet for Healthcare Providers:  https://www.young.biz/https://www.fda.gov/media/142435/download  This test is no t yet approved or cleared by the [place] FDA and  has been authorized for detection and/or diagnosis of SARS-CoV-2 by FDA under an Emergency Use Authorization (EUA). This EUA will remain  in effect (meaning this test can be used) for the duration of the COVID-19 declaration under Section 564(b)(1) of the Act, 21 U.S.C. section 360bbb-3(b)(1), unless the authorization is terminated or revoked sooner.     Influenza A by PCR NEGATIVE NEGATIVE Final   Influenza B by PCR NEGATIVE NEGATIVE Final    Comment: (NOTE) The Xpert Xpress SARS-CoV-2/FLU/RSV assay is intended as an aid in  the diagnosis of influenza from Nasopharyngeal swab specimens and  should not be used as a sole basis for treatment. Nasal washings and  aspirates are unacceptable for Xpert Xpress SARS-CoV-2/FLU/RSV  testing.  Fact Sheet for Patients: https://www.moore.com/https://www.fda.gov/media/142436/download  Fact Sheet for Healthcare Providers: https://www.young.biz/https://www.fda.gov/media/142435/download  This test is not yet approved or cleared by the [place] FDA and  has been authorized for detection and/or diagnosis of SARS-CoV-2 by  FDA under an Emergency Use Authorization (EUA). This EUA will remain  in effect (meaning this test can be used) for the duration of the  Covid-19 declaration under Section 564(b)(1) of the Act, 21  U.S.C.  section 360bbb-3(b)(1), unless the authorization is  terminated or revoked. Performed at [facility], [address] [place], [place] [address]   SARS Coronavirus 2 by RT PCR (hospital order, performed in [place] [facility] hospital lab) Nasopharyngeal Nasopharyngeal Swab     Status: None   Collection Time: 07/31/20  3:02 PM   Specimen: Nasopharyngeal Swab  Result Value Ref Range Status   SARS Coronavirus 2 NEGATIVE NEGATIVE Final    Comment: (NOTE) SARS-CoV-2 target nucleic acids are NOT DETECTED.  The SARS-CoV-2 RNA is generally detectable in upper and lower respiratory specimens during the acute phase of infection. The lowest concentration of SARS-CoV-2 viral copies this assay can detect is 250 copies / mL. A negative result does not preclude SARS-CoV-2 infection and should not be used as the sole basis for treatment or other patient management decisions.  A negative result may occur with improper specimen collection / handling, submission of specimen other than nasopharyngeal swab, presence of viral mutation(s) within the areas targeted by this assay, and inadequate number of viral copies (<250 copies / mL). A negative result must be combined with clinical observations, patient history, and epidemiological information.  Fact Sheet for Patients:   BoilerBrush.com.cy  Fact Sheet for Healthcare Providers: https://pope.com/  This test is not yet approved or  cleared by the [place] FDA and has been authorized for detection and/or diagnosis of SARS-CoV-2 by FDA under an Emergency Use Authorization (EUA).  This EUA will remain in effect (meaning this test can be used) for the duration of the COVID-19 declaration under Section 564(b)(1) of the Act, 21 U.S.C. section 360bbb-3(b)(1), unless the authorization is terminated or revoked sooner.  Performed at [facility], [address] [place], [place] [address]       Medications:   . (feeding supplement) PROSource Plus  30 mL Oral BID BM  . allopurinol  300 mg Oral Daily  . apixaban  5 mg Oral BID  . atorvastatin  40 mg Oral Daily  . carbidopa-levodopa  2 tablet Oral BID   And  . carbidopa-levodopa  1 tablet Oral Daily  . docusate sodium  100 mg Oral BID  . feeding supplement (GLUCERNA SHAKE)  237 mL Oral TID BM  . fluconazole  100 mg Oral Daily  . insulin aspart  0-9 Units Subcutaneous TID WC  . metoprolol tartrate  12.5 mg Oral BID  . sodium chloride flush  3 mL Intravenous Q12H   Continuous Infusions:    LOS: 5 days   Marinda Elk  [facility]  08/01/2020, 12:21 PM

## 2020-08-01 NOTE — Progress Notes (Signed)
Nutrition Follow-up  DOCUMENTATION CODES:   Obesity unspecified  INTERVENTION:   - Glucerna Shake po TID, each supplement provides 220 kcal and 10 grams of protein  - ProSource Plus 45 ml BID, each supplement provides 100 kcal and 15 grams of protein  NUTRITION DIAGNOSIS:   Increased nutrient needs related to acute illness as evidenced by estimated needs.  Ongoing  GOAL:   Patient will meet greater than or equal to 90% of their needs  Progressing   MONITOR:   PO intake, Supplement acceptance, Labs, Weight trends, Skin  REASON FOR ASSESSMENT:   Consult Other (Comment) (nutritional goals)  ASSESSMENT:   74 year old female who presented to the ED on 10/15 after a fall. PMH of advanced Parkinson's disease, atrial fibrillation, DM, HTN. Pt previously admitted from 9/24 to 9/28 for rhabdo and AKI associated with a fall then discharged to SNF rehab.  Reviewed I/O's: +384 ml x 24 hours and -747 ml since admission  UOP: 1.3L x 24 hours  Spoke with pt at bedside, who reports she is feeling better today. She reports fair appetite, and she is eating about 50% of meals (noted meal completion 50-80%). Pt shares she also had a poor appetite PTA and "my sons were fussing at me to eat more"; she had been consuming 2 meals per day PTA (ham biscuit in AM and meat, starch, and vegetable in PM). Pt reports she is drinking her Glucerna and Prosource supplements.  Noted hand tremors due to Parkinson's; pt denies difficulty feeding herself.   Reviewed wt hx; pt has experienced a 6.6% wt loss over the past 3 months, which is not significant for time frame.   Discussed importance of good meal and supplement intake to promote healing. Pt amenable to continue supplements.   Labs reviewed: CBGS: 127-189 (inpatient orders for glycemic control are 0-9 units insulin aspart TID with meals).   NUTRITION - FOCUSED PHYSICAL EXAM:    Most Recent Value  Orbital Region No depletion  Upper Arm Region  Mild depletion  Thoracic and Lumbar Region No depletion  Buccal Region No depletion  Temple Region No depletion  Clavicle Bone Region No depletion  Clavicle and Acromion Bone Region No depletion  Scapular Bone Region No depletion  Dorsal Hand No depletion  Patellar Region No depletion  Anterior Thigh Region No depletion  Posterior Calf Region No depletion  Edema (RD Assessment) Mild  Hair Reviewed  Eyes Reviewed  Mouth Reviewed  Skin Reviewed  Nails Reviewed       Diet Order:   Diet Order            Diet Carb Modified Fluid consistency: Thin; Room service appropriate? Yes  Diet effective now                 EDUCATION NEEDS:   No education needs have been identified at this time  Skin:  Skin Assessment: Skin Integrity Issues: Skin Integrity Issues:: Other (Comment) Other: MASD perineum  Last BM:  07/31/20  Height:   Ht Readings from Last 1 Encounters:  07/28/20 5\' 4"  (1.626 m)    Weight:   Wt Readings from Last 1 Encounters:  08/01/20 87.4 kg   BMI:  Body mass index is 33.08 kg/m.  Estimated Nutritional Needs:   Kcal:  1650-1850  Protein:  85-100 grams  Fluid:  1.7-1.9 L    08/03/20, RD, LDN, CDCES Registered Dietitian II Certified Diabetes Care and Education Specialist Please refer to [facility] for RD and/or RD on-call/weekend/after hours  pager

## 2020-08-02 DIAGNOSIS — E6609 Other obesity due to excess calories: Secondary | ICD-10-CM | POA: Diagnosis not present

## 2020-08-02 DIAGNOSIS — G909 Disorder of the autonomic nervous system, unspecified: Secondary | ICD-10-CM | POA: Diagnosis not present

## 2020-08-02 DIAGNOSIS — G20 Parkinson's disease: Secondary | ICD-10-CM | POA: Diagnosis not present

## 2020-08-02 DIAGNOSIS — Z7401 Bed confinement status: Secondary | ICD-10-CM | POA: Diagnosis not present

## 2020-08-02 DIAGNOSIS — N179 Acute kidney failure, unspecified: Secondary | ICD-10-CM | POA: Diagnosis not present

## 2020-08-02 DIAGNOSIS — T796XXA Traumatic ischemia of muscle, initial encounter: Secondary | ICD-10-CM | POA: Diagnosis not present

## 2020-08-02 DIAGNOSIS — I480 Paroxysmal atrial fibrillation: Secondary | ICD-10-CM | POA: Diagnosis not present

## 2020-08-02 DIAGNOSIS — W19XXXA Unspecified fall, initial encounter: Secondary | ICD-10-CM | POA: Diagnosis not present

## 2020-08-02 DIAGNOSIS — R5381 Other malaise: Secondary | ICD-10-CM | POA: Diagnosis not present

## 2020-08-02 DIAGNOSIS — E11649 Type 2 diabetes mellitus with hypoglycemia without coma: Secondary | ICD-10-CM | POA: Diagnosis not present

## 2020-08-02 DIAGNOSIS — M6281 Muscle weakness (generalized): Secondary | ICD-10-CM | POA: Diagnosis not present

## 2020-08-02 DIAGNOSIS — M2550 Pain in unspecified joint: Secondary | ICD-10-CM | POA: Diagnosis not present

## 2020-08-02 DIAGNOSIS — M6282 Rhabdomyolysis: Secondary | ICD-10-CM | POA: Diagnosis not present

## 2020-08-02 DIAGNOSIS — G9341 Metabolic encephalopathy: Secondary | ICD-10-CM | POA: Diagnosis not present

## 2020-08-02 DIAGNOSIS — R296 Repeated falls: Secondary | ICD-10-CM | POA: Diagnosis not present

## 2020-08-02 DIAGNOSIS — R2681 Unsteadiness on feet: Secondary | ICD-10-CM | POA: Diagnosis not present

## 2020-08-02 DIAGNOSIS — E119 Type 2 diabetes mellitus without complications: Secondary | ICD-10-CM | POA: Diagnosis not present

## 2020-08-02 DIAGNOSIS — I10 Essential (primary) hypertension: Secondary | ICD-10-CM | POA: Diagnosis not present

## 2020-08-02 LAB — GLUCOSE, CAPILLARY
Glucose-Capillary: 113 mg/dL — ABNORMAL HIGH (ref 70–99)
Glucose-Capillary: 178 mg/dL — ABNORMAL HIGH (ref 70–99)
Glucose-Capillary: 211 mg/dL — ABNORMAL HIGH (ref 70–99)

## 2020-08-02 LAB — SARS CORONAVIRUS 2 BY RT PCR (HOSPITAL ORDER, PERFORMED IN ~~LOC~~ HOSPITAL LAB): SARS Coronavirus 2: NEGATIVE

## 2020-08-02 MED ORDER — ATORVASTATIN CALCIUM 40 MG PO TABS: 40.0000 mg | ORAL_TABLET | Freq: Every day | ORAL | 0 refills | Status: AC

## 2020-08-02 MED ORDER — DOCUSATE SODIUM 100 MG PO CAPS: 100.0000 mg | ORAL_CAPSULE | Freq: Two times a day (BID) | ORAL | 0 refills | Status: AC

## 2020-08-02 NOTE — [place] FL2 (Signed)
[place] MEDICAID FL2 LEVEL OF CARE SCREENING TOOL     IDENTIFICATION  Patient Name: Sherri Pratt Birthdate: 1946/10/08 Sex: female Admission Date (Current Location): 07/27/2020  [facility] and [place] Number:  Producer, television/film/video and Address:  [facility] [place], [address] [place], [place] [address]      Provider Number: [id number]  Attending Physician Name and Address:  Marinda Elk, MD  Relative Name and Phone Number:  Barbara Cower [phone]    Current Level of Care: Hospital Recommended Level of Care: Skilled Nursing Facility Prior Approval Number:    Date Approved/Denied:   PASRR Number: [id number]  Discharge Plan: SNF    Current Diagnoses: Patient Active Problem List   Diagnosis Date Noted  . Chronic anticoagulation   . Palliative care by specialist   . Goals of care, counseling/discussion   . Fall   . Gait instability   . AKI (acute kidney injury) (HCC) 07/06/2020  . Traumatic rhabdomyolysis (HCC) 07/06/2020  . PAF (paroxysmal atrial fibrillation) (HCC) 07/06/2020  . DM2 (diabetes mellitus, type 2) (HCC) 07/06/2020  . HTN (hypertension) 07/06/2020  . Parkinson's disease (HCC) 07/06/2020  . Pressure injury of skin 04/20/2020  . Sepsis (HCC) 04/19/2020  . Severe sepsis (HCC) 04/18/2020  . CAP (community acquired pneumonia) 04/18/2020  . General weakness 04/18/2020  . Acute metabolic encephalopathy 04/18/2020  . Class 1 obesity due to excess calories with body mass index (BMI) of 30.0 to 30.9 in adult 11/12/2018  . Parkinson's disease (HCC) 10/19/2018  . Prolonged QT interval 10/13/2018  . Hypokalemia 10/13/2018  . SDH (subdural hematoma) (HCC) 10/13/2018  . Syncope and collapse 10/12/2018  . Type 2 diabetes mellitus with hypoglycemia without coma (HCC) 10/12/2018  . Paroxysmal atrial fibrillation (HCC) 04/17/2017  . Hypophosphatemia 04/17/2017  . Hypomagnesemia 04/17/2017  . Acute lower UTI 04/17/2017  . Hyperthyroidism  04/17/2017  . AKI (acute kidney injury) (HCC) 04/16/2017  . Anemia 11/08/2012  . GOUT 11/12/2010  . Chronic kidney disease 03/27/2009  . Diabetes 1.5, managed as type 1 (HCC) 09/21/2007  . Mixed hyperlipidemia 09/21/2007  . Essential hypertension 09/21/2007    Orientation RESPIRATION BLADDER Height & Weight     Self, Time, Situation, Place  Normal Incontinent, External catheter Weight: 193 lb 11.2 oz (87.9 kg) Height:  5\' 4"  (162.6 cm)  BEHAVIORAL SYMPTOMS/MOOD NEUROLOGICAL BOWEL NUTRITION STATUS      Continent Diet (See discharge summary)  AMBULATORY STATUS COMMUNICATION OF NEEDS Skin   Extensive Assist Verbally  (MASD)                       Personal Care Assistance Level of Assistance  Bathing, Feeding, Dressing Bathing Assistance: Maximum assistance Feeding assistance: Limited assistance Dressing Assistance: Maximum assistance     Functional Limitations Info    Sight Info: Adequate Hearing Info: Adequate Speech Info: Adequate    SPECIAL CARE FACTORS FREQUENCY  PT (By licensed PT), OT (By licensed OT)     PT Frequency: 5x per week OT Frequency: 5x per week            Contractures Contractures Info: Not present    Additional Factors Info  Code Status Code Status Info: DNR Allergies Info: Peanut           Current Medications (08/02/2020):  This is the current hospital active medication list Current Facility-Administered Medications  Medication Dose Route Frequency Provider Last Rate Last Admin  . (feeding supplement) PROSource Plus liquid  30 mL  30 mL Oral BID BM Berton Mount I, MD   30 mL at 08/02/20 0806  . acetaminophen (TYLENOL) tablet 650 mg  650 mg Oral Q6H PRN Jonah Blue, MD   650 mg at 07/27/20 2213   Or  . acetaminophen (TYLENOL) suppository 650 mg  650 mg Rectal Q6H PRN Jonah Blue, MD      . allopurinol (ZYLOPRIM) tablet 300 mg  300 mg Oral Daily Jonah Blue, MD   300 mg at 08/02/20 0806  . apixaban (ELIQUIS) tablet 5  mg  5 mg Oral BID Jonah Blue, MD   5 mg at 08/02/20 0806  . atorvastatin (LIPITOR) tablet 40 mg  40 mg Oral Daily Croitoru, Mihai, MD   40 mg at 08/02/20 0805  . bisacodyl (DULCOLAX) EC tablet 5 mg  5 mg Oral Daily PRN Jonah Blue, MD      . carbidopa-levodopa (SINEMET IR) 25-100 MG per tablet immediate release 2 tablet  2 tablet Oral BID Jonah Blue, MD   2 tablet at 08/02/20 0805   And  . carbidopa-levodopa (SINEMET IR) 25-100 MG per tablet immediate release 1 tablet  1 tablet Oral Daily Jonah Blue, MD   1 tablet at 08/01/20 1824  . docusate sodium (COLACE) capsule 100 mg  100 mg Oral BID Jonah Blue, MD   100 mg at 08/02/20 0805  . feeding supplement (GLUCERNA SHAKE) (GLUCERNA SHAKE) liquid 237 mL  237 mL Oral TID BM Berton Mount I, MD   237 mL at 08/02/20 0804  . fluconazole (DIFLUCAN) tablet 100 mg  100 mg Oral Daily Berton Mount I, MD   100 mg at 08/02/20 0805  . hydrALAZINE (APRESOLINE) injection 5 mg  5 mg Intravenous Q4H PRN Jonah Blue, MD      . HYDROcodone-acetaminophen (NORCO/VICODIN) 5-325 MG per tablet 1-2 tablet  1-2 tablet Oral Q4H PRN Jonah Blue, MD      . insulin aspart (novoLOG) injection 0-9 Units  0-9 Units Subcutaneous TID WC Jonah Blue, MD   2 Units at 08/01/20 1720  . metoprolol tartrate (LOPRESSOR) tablet 12.5 mg  12.5 mg Oral BID Jonah Blue, MD   12.5 mg at 08/02/20 0805  . morphine 2 MG/ML injection 2 mg  2 mg Intravenous Q2H PRN Jonah Blue, MD      . ondansetron [facility]) tablet 4 mg  4 mg Oral Q6H PRN Jonah Blue, MD       Or  . ondansetron [facility]) injection 4 mg  4 mg Intravenous Q6H PRN Jonah Blue, MD      . polyethylene glycol (MIRALAX / GLYCOLAX) packet 17 g  17 g Oral Daily PRN Jonah Blue, MD      . sodium chloride flush (NS) 0.9 % injection 3 mL  3 mL Intravenous Q12H Jonah Blue, MD   3 mL at 08/02/20 0806     Discharge Medications: Please see discharge summary for a list of discharge  medications.  Relevant Imaging Results:  Relevant Lab Results:   Additional Information SSN [id number] LCSWA

## 2020-08-02 NOTE — Care Management Important Message (Signed)
Important Message  Patient Details  Name: Sherri Pratt MRN: [id number] Date of Birth: 24-Sep-1946   Medicare Important Message Given:  Yes     Renie Ora 08/02/2020, 8:41 AM

## 2020-08-02 NOTE — Progress Notes (Signed)
[facility] PROGRESS NOTE    Progress Note  Sherri Pratt  ZOX:[id number] DOB: 1946-09-03 DOA: 07/27/2020 PCP: [name], NP     Brief Narrative:   Sherri Pratt is an 74 y.o. female past medical history of Parkinson's disease chronic atrial fibrillation on Eliquis diabetes mellitus type 2 recently discharged from the hospital on 07/10/2020 for acute kidney injury from rhabdomyolysis comes into the hospital was found on the floor in the ED she was found rhabdomyolysis again  Assessment/Plan:   Traumatic rhabdomyolysis [facility]) Multiple falls continues to trend continue supportive care.  Frequent recurrent falls: Suspect autonomic dysfunction due to Parkinson's disease. PT evaluated the patient recommended skilled nursing facility unfortunately the patient is only 5 days left from skilled. Social worker working on placement.  Paroxysmal atrial fibrillation: If she is discharged to skilled where she is supervised and she can be discharged with Eliquis unfortunately if she is discharged home it is unreasonable to discharge her on anticoagulation and recommend stopping it. Was discussed with the family.  Essential hypertension: Permissive hypertension due to the risk of orthostasis in the setting of Parkinson's disease continue low-dose metoprolol.  Diabetes mellitus type 2: Cont SSI.[name]  Parkinson's disease: Continue Sinemet per neurology.  Sacral decubitus ulcer stage II present on admission: RN Pressure Injury Documentation: Pressure Injury 04/20/20 Right Stage 2 -  Partial thickness loss of dermis presenting as a shallow open injury with a red, pink wound bed without slough. Stage 2 pressure injuries to middle back, right buttock, right hip, right posterior thigh, right inn (Active)  04/20/20   Location:   Location Orientation: Right  Staging: Stage 2 -  Partial thickness loss of dermis presenting as a shallow open injury with a red, pink wound bed without slough.    Wound Description (Comments): Stage 2 pressure injuries to middle back, right buttock, right hip, right posterior thigh, right inner thigh/hip  Present on Admission: Yes   DVT prophylaxis: eliquis Family Communication:none Status is: Inpatient  Remains inpatient appropriate because:Hemodynamically unstable   Dispo: The patient is from: Home              Anticipated d/c is to: Home              Anticipated d/c date is: 1 days              Patient currently is medically stable to d/c, awaiting skilled nursing facility placement. Code Status:     Code Status Orders  (From admission, onward)         Start     Ordered   07/27/20 1645  Do not attempt resuscitation (DNR)  Continuous       Question Answer Comment  In the event of cardiac or respiratory ARREST Do not call a "code blue"   In the event of cardiac or respiratory ARREST Do not perform Intubation, CPR, defibrillation or ACLS   In the event of cardiac or respiratory ARREST Use medication by any route, position, wound care, and other measures to relive pain and suffering. May use oxygen, suction and manual treatment of airway obstruction as needed for comfort.      07/27/20 1646        Code Status History    Date Active Date Inactive Code Status Order ID Comments User Context   07/06/2020 2341 07/11/2020 0135 Full Code [id number]  Hillary Bow, DO ED   04/18/2020 2227 04/24/2020 2227 Full Code [id number]  Howerter, Chaney Born, DO ED  10/12/2018 1942 10/15/2018 1943 Full Code [id number]  Clydie BraunSmith, Rondell A, MD ED   04/15/2017 0420 04/17/2017 2230 Full Code [id number]  Eduard ClosKakrakandy, Arshad N, MD ED   Advance Care Planning Activity        IV Access:    Peripheral IV   Procedures and diagnostic studies:   No results found.   Medical Consultants:    None.  Anti-Infectives:   none  Subjective:    Sherri Pratt no new complaints today.  Objective:    Vitals:   08/01/20 2010 08/01/20 2227 08/02/20 0411 08/02/20  0803  BP: 107/74 130/68 (!) 147/63 139/78  Pulse: 74 65 (!) 58 (!) 56  Resp: 18  18   Temp: 98.1 F (36.7 C)  98.1 F (36.7 C)   TempSrc: Oral  Oral   SpO2: 97%  95%   Weight:   87.9 kg   Height:       SpO2: 95 %   Intake/Output Summary (Last 24 hours) at 08/02/2020 1009 Last data filed at 08/02/2020 0800 Gross per 24 hour  Intake 954 ml  Output 1550 ml  Net -596 ml   Filed Weights   07/31/20 0438 08/01/20 0514 08/02/20 0411  Weight: 88 kg 87.4 kg 87.9 kg    Exam: General exam: In no acute distress. Respiratory system: Good air movement and clear to auscultation. Cardiovascular system: S1 & S2 heard, RRR. No JVD. Gastrointestinal system: Abdomen is nondistended, soft and nontender.  Extremities: No pedal edema. Skin: No rashes, lesions or ulcers  Data Reviewed:    Labs: Basic Metabolic Panel: Recent Labs  Lab 07/27/20 1340 07/27/20 1340 07/28/20 0205 07/29/20 0327  NA 138  --  140 142  K 4.2   < > 3.6 3.7  CL 103  --  106 108  CO2 25  --  24 25  GLUCOSE 157*  --  109* 109*  BUN 17  --  24* 19  CREATININE 1.03*  --  1.10* 1.03*  CALCIUM 9.4  --  8.9 8.9  MG  --   --   --  1.7  PHOS  --   --   --  3.4   < > = values in this interval not displayed.   GFR Estimated Creatinine Clearance: 50.7 mL/min (A) (by C-G formula based on SCr of 1.03 mg/dL (H)). Liver Function Tests: Recent Labs  Lab 07/27/20 1340 07/28/20 0205 07/29/20 0327  AST 43* 27  --   ALT 25 8  --   ALKPHOS 69 53  --   BILITOT 0.8 0.8  --   PROT 6.7 5.3*  --   ALBUMIN 3.5 2.8* 2.7*   No results for input(s): LIPASE, AMYLASE in the last 168 hours. No results for input(s): AMMONIA in the last 168 hours. Coagulation profile Recent Labs  Lab 07/27/20 1340  INR 1.0   COVID-19 Labs  No results for input(s): DDIMER, FERRITIN, LDH, CRP in the last 72 hours.  Lab Results  Component Value Date   SARSCOV2NAA NEGATIVE 07/31/2020   SARSCOV2NAA NEGATIVE 07/27/2020   SARSCOV2NAA  NEGATIVE 07/09/2020   SARSCOV2NAA NEGATIVE 07/06/2020    CBC: Recent Labs  Lab 07/27/20 1340 07/28/20 0205 07/29/20 0327  WBC 11.5* 8.1 5.1  NEUTROABS 9.9*  --   --   HGB 12.6 10.7* 10.8*  HCT 40.3 34.3* 33.8*  MCV 86.9 86.4 86.7  PLT 228 197 174   Cardiac Enzymes: Recent Labs  Lab 07/27/20 1340 07/28/20 0205 07/29/20 0327  CKTOTAL 1,417* 787* 371*   BNP (last 3 results) No results for input(s): PROBNP in the last 8760 hours. CBG: Recent Labs  Lab 08/01/20 0625 08/01/20 1210 08/01/20 1612 08/01/20 2236 08/02/20 0606  GLUCAP 127* 151* 151* 180* 113*   D-Dimer: No results for input(s): DDIMER in the last 72 hours. Hgb A1c: No results for input(s): HGBA1C in the last 72 hours. Lipid Profile: No results for input(s): CHOL, HDL, LDLCALC, TRIG, CHOLHDL, LDLDIRECT in the last 72 hours. Thyroid function studies: No results for input(s): TSH, T4TOTAL, T3FREE, THYROIDAB in the last 72 hours.  Invalid input(s): FREET3 Anemia work up: No results for input(s): VITAMINB12, FOLATE, FERRITIN, TIBC, IRON, RETICCTPCT in the last 72 hours. Sepsis Labs: Recent Labs  Lab 07/27/20 1340 07/27/20 1544 07/28/20 0205 07/29/20 0327  WBC 11.5*  --  8.1 5.1  LATICACIDVEN 2.6* 2.0*  --   --    Microbiology Recent Results (from the past 240 hour(s))  Respiratory Panel by RT PCR (Flu A&B, Covid) - Nasopharyngeal Swab     Status: None   Collection Time: 07/27/20  4:49 PM   Specimen: Nasopharyngeal Swab  Result Value Ref Range Status   SARS Coronavirus 2 by RT PCR NEGATIVE NEGATIVE Final    Comment: (NOTE) SARS-CoV-2 target nucleic acids are NOT DETECTED.  The SARS-CoV-2 RNA is generally detectable in upper respiratoy specimens during the acute phase of infection. The lowest concentration of SARS-CoV-2 viral copies this assay can detect is 131 copies/mL. A negative result does not preclude SARS-Cov-2 infection and should not be used as the sole basis for treatment or other  patient management decisions. A negative result may occur with  improper specimen collection/handling, submission of specimen other than nasopharyngeal swab, presence of viral mutation(s) within the areas targeted by this assay, and inadequate number of viral copies (<131 copies/mL). A negative result must be combined with clinical observations, patient history, and epidemiological information. The expected result is Negative.  Fact Sheet for Patients:  https://www.moore.com/  Fact Sheet for Healthcare Providers:  https://www.young.biz/  This test is no t yet approved or cleared by the [place] FDA and  has been authorized for detection and/or diagnosis of SARS-CoV-2 by FDA under an Emergency Use Authorization (EUA). This EUA will remain  in effect (meaning this test can be used) for the duration of the COVID-19 declaration under Section 564(b)(1) of the Act, 21 U.S.C. section 360bbb-3(b)(1), unless the authorization is terminated or revoked sooner.     Influenza A by PCR NEGATIVE NEGATIVE Final   Influenza B by PCR NEGATIVE NEGATIVE Final    Comment: (NOTE) The Xpert Xpress SARS-CoV-2/FLU/RSV assay is intended as an aid in  the diagnosis of influenza from Nasopharyngeal swab specimens and  should not be used as a sole basis for treatment. Nasal washings and  aspirates are unacceptable for Xpert Xpress SARS-CoV-2/FLU/RSV  testing.  Fact Sheet for Patients: https://www.moore.com/  Fact Sheet for Healthcare Providers: https://www.young.biz/  This test is not yet approved or cleared by the [place] FDA and  has been authorized for detection and/or diagnosis of SARS-CoV-2 by  FDA under an Emergency Use Authorization (EUA). This EUA will remain  in effect (meaning this test can be used) for the duration of the  Covid-19 declaration under Section 564(b)(1) of the Act, 21  U.S.C. section  360bbb-3(b)(1), unless the authorization is  terminated or revoked. Performed at [facility], [address], [place], [place] [address]   SARS Coronavirus 2 by RT  PCR (hospital order, performed in [facility] hospital lab) Nasopharyngeal Nasopharyngeal Swab     Status: None   Collection Time: 07/31/20  3:02 PM   Specimen: Nasopharyngeal Swab  Result Value Ref Range Status   SARS Coronavirus 2 NEGATIVE NEGATIVE Final    Comment: (NOTE) SARS-CoV-2 target nucleic acids are NOT DETECTED.  The SARS-CoV-2 RNA is generally detectable in upper and lower respiratory specimens during the acute phase of infection. The lowest concentration of SARS-CoV-2 viral copies this assay can detect is 250 copies / mL. A negative result does not preclude SARS-CoV-2 infection and should not be used as the sole basis for treatment or other patient management decisions.  A negative result may occur with improper specimen collection / handling, submission of specimen other than nasopharyngeal swab, presence of viral mutation(s) within the areas targeted by this assay, and inadequate number of viral copies (<250 copies / mL). A negative result must be combined with clinical observations, patient history, and epidemiological information.  Fact Sheet for Patients:   BoilerBrush.com.cy  Fact Sheet for Healthcare Providers: https://pope.com/  This test is not yet approved or  cleared by the [place] FDA and has been authorized for detection and/or diagnosis of SARS-CoV-2 by FDA under an Emergency Use Authorization (EUA).  This EUA will remain in effect (meaning this test can be used) for the duration of the COVID-19 declaration under Section 564(b)(1) of the Act, 21 U.S.C. section 360bbb-3(b)(1), unless the authorization is terminated or revoked sooner.  Performed at [facility], [address], [place], [place] [address]       Medications:   . (feeding supplement) PROSource Plus  30 mL Oral BID BM  . allopurinol  300 mg Oral Daily  . apixaban  5 mg Oral BID  . atorvastatin  40 mg Oral Daily  . carbidopa-levodopa  2 tablet Oral BID   And  . carbidopa-levodopa  1 tablet Oral Daily  . docusate sodium  100 mg Oral BID  . feeding supplement (GLUCERNA SHAKE)  237 mL Oral TID BM  . fluconazole  100 mg Oral Daily  . insulin aspart  0-9 Units Subcutaneous TID WC  . metoprolol tartrate  12.5 mg Oral BID  . sodium chloride flush  3 mL Intravenous Q12H   Continuous Infusions:    LOS: 6 days   Marinda Elk  [facility]  08/02/2020, 10:09 AM

## 2020-08-02 NOTE — Progress Notes (Signed)
   08/02/20 1000  Clinical Encounter Type  Visited With Patient  Visit Type Follow-up  Referral From Nurse  Consult/Referral To Chaplain  The chaplain checked on the patient per unit secretary. The patient is in good spirits. The patient is doing well and completed the AD with Bernette Redbird. The patient has not further chaplain needs. The Chaplain will follow up as needed.

## 2020-08-02 NOTE — Progress Notes (Signed)
D/C instructions printed and placed in packet at nurse's station. Tele and IV removed, tolerated well.  

## 2020-08-02 NOTE — Discharge Summary (Signed)
Physician Discharge Summary  VYLA PINT WUJ:[id number] DOB: [birth year] DOA: 07/27/2020  PCP: [name], NP  Admit date: 07/27/2020 Discharge date: 08/02/2020  Admitted From: Home Disposition:  SNF  Recommendations for Outpatient Follow-up:  1. Follow up with PCP in 1-2 weeks 2. Please obtain BMP/CBC in one week   Home Health:No Equipment/Devices:None  Discharge Condition:Stable CODE STATUS:DNR Diet recommendation: Heart Healthy    Brief/Interim Summary: 74 y.o. female past medical history of Parkinson's disease chronic atrial fibrillation on Eliquis diabetes mellitus type 2 recently discharged from the hospital on 07/10/2020 for acute kidney injury from rhabdomyolysis comes into the hospital was found on the floor in the ED she was found rhabdomyolysis again  Discharge Diagnoses:  Principal Problem:   Traumatic rhabdomyolysis (HCC) Active Problems:   Mixed hyperlipidemia   AKI (acute kidney injury) (HCC)   Parkinson's disease (HCC)   Class 1 obesity due to excess calories with body mass index (BMI) of 30.0 to 30.9 in adult   PAF (paroxysmal atrial fibrillation) (HCC)   DM2 (diabetes mellitus, type 2) (HCC)   HTN (hypertension)   Palliative care by specialist   Goals of care, counseling/discussion   Chronic anticoagulation  Traumatic rhabdomyolysis likely due to multiple falls: She was treated conservatively with IV fluids kidney function remained stable.  Frequent falls: Suspect autonomic dysfunction due to Parkinson's disease, PT evaluated the patient recommended skilled nursing facility.  Paroxysmal atrial fibrillation: Rate controlled metoprolol, will continue Eliquis as an outpatient as she is going to a skilled nursing facility. If she continues to have recurrent falls we will have to reevaluate her to see if she is a candidate for Eliquis as an outpatient at the skilled nursing facility.  Hypertension: Due to her high risk of fall due to her possible  autonomic dysfunction will allow permissive hypertension as long as she is asymptomatic.  Diabetes mellitus type 2: No change made to his medication.  Parkinson disease: Continue Sinemet.  Sacral decubitus ulcer stage II present on admission:  Discharge Instructions  Discharge Instructions    Change dressing (specify)   Complete by: As directed    Dressing change: once a day times   Diet - low sodium heart healthy   Complete by: As directed    Increase activity slowly   Complete by: As directed      Allergies as of 08/02/2020      Reactions   Peanut-containing Drug Products    unknown      Medication List    TAKE these medications   acetaminophen 325 MG tablet Commonly known as: TYLENOL Take 2 tablets (650 mg total) by mouth every 6 (six) hours as needed for mild pain (or Fever >/= 101).   allopurinol 300 MG tablet Commonly known as: ZYLOPRIM Take 1 tablet (300 mg total) by mouth daily.   atorvastatin 40 MG tablet Commonly known as: LIPITOR Take 1 tablet (40 mg total) by mouth daily. Start taking on: August 03, 2020   carbidopa-levodopa 25-100 MG tablet Commonly known as: SINEMET IR Take 2 tablets at 8:30am/2 tablets at 12:30pm/1 tablet at 4:30pm What changed:   how much to take  how to take this  when to take this  additional instructions   docusate sodium 100 MG capsule Commonly known as: COLACE Take 1 capsule (100 mg total) by mouth 2 (two) times daily.   Eliquis 5 MG Tabs tablet Generic drug: apixaban TAKE 1 TABLET BY MOUTH TWICE A DAY What changed: how much to take  glipiZIDE 2.5 MG 24 hr tablet Commonly known as: GLUCOTROL XL Take 1 tablet (2.5 mg total) by mouth daily with breakfast.   metoprolol tartrate 25 MG tablet Commonly known as: LOPRESSOR TAKE 1/2 TABLET BY MOUTH TWICE DAILY   nystatin cream Commonly known as: MYCOSTATIN Apply 1 application topically in the morning and at bedtime.   simvastatin 20 MG tablet Commonly known  as: ZOCOR Take 1 tablet (20 mg total) by mouth at bedtime.            Discharge Care Instructions  (From admission, onward)         Start     Ordered   08/02/20 0000  Change dressing (specify)       Comments: Dressing change: once a day times   08/02/20 1124          Contact information for after-discharge care    Destination    [facility] .   Service: Skilled Nursing Contact information: [name] [place] [address] [phone]                 Allergies  Allergen Reactions  . Peanut-Containing Drug Products     unknown    Consultations:  Neurology   Procedures/Studies: CT Head Wo Contrast  Result Date: 07/27/2020 CLINICAL DATA:  Patient found on the bathroom floor. Possible head trauma. EXAM: CT HEAD WITHOUT CONTRAST TECHNIQUE: Contiguous axial images were obtained from the base of the skull through the vertex without intravenous contrast. COMPARISON:  04/18/2020 FINDINGS: Brain: No evidence of acute infarction, hemorrhage, hydrocephalus, extra-axial collection or mass lesion/mass effect. There is ventricular and sulcal enlargement consistent with mild atrophy. Patchy periventricular white matter hypoattenuation is also noted consistent with mild chronic microvascular ischemic change. These findings are stable. Vascular: No hyperdense vessel or unexpected calcification. Skull: Normal. Negative for fracture or focal lesion. Sinuses/Orbits: Globes and orbits are unremarkable. Sinuses are clear. Other: None. IMPRESSION: 1. No acute intracranial abnormalities. 2. Mild atrophy and chronic microvascular ischemic change. Stable appearance the prior head CT. Electronically Signed   By: Amie Portland M.D.   On: 07/27/2020 15:23   CT HEAD WO CONTRAST  Result Date: 07/06/2020 CLINICAL DATA:  Head trauma, minor. EXAM: CT HEAD WITHOUT CONTRAST CT CERVICAL SPINE WITHOUT CONTRAST TECHNIQUE: Multidetector CT imaging of the head and cervical  spine was performed following the standard protocol without intravenous contrast. Multiplanar CT image reconstructions of the cervical spine were also generated. COMPARISON:  CT head/cervical spine 07/18/2018. FINDINGS: CT HEAD FINDINGS Brain: Stable, moderate generalized cerebral atrophy. Stable, mild ill-defined hypoattenuation within the cerebral white matter which is nonspecific, but likely reflects sequela of chronic small vessel ischemic disease. There is no acute intracranial hemorrhage. No demarcated cortical infarct. No extra-axial fluid collection. No evidence of intracranial mass. No midline shift. Vascular: No hyperdense vessel.  Atherosclerotic calcifications. Skull: Normal. Negative for fracture or focal lesion. Sinuses/Orbits: Visualized orbits show no acute finding. Mild ethmoid sinus mucosal thickening. Frothy secretions within the left frontal sinus. No significant mastoid effusion. CT CERVICAL SPINE FINDINGS Alignment: A mild cervical levocurvature may be positional. Trace C4-C5 grade 1 anterolisthesis Skull base and vertebrae: The basion-dental and atlanto-dental intervals are maintained.No evidence of acute fracture to the cervical spine. 4 mm sclerotic focus within the left C4 articular pillar, nonspecific but possibly reflecting a bone island. Soft tissues and spinal canal: No prevertebral fluid or swelling. No visible canal hematoma. Disc levels: Cervical spondylosis. Most notably at C5-C6 and C6-C7 there is  moderate disc degeneration with posterior disc osteophytes and uncovertebral hypertrophy. The C5-C6 posterior disc osteophyte contributes to moderate/severe spinal canal stenosis. Moderate/severe spinal canal stenosis at these levels with possible spinal cord mass effect. Upper chest: No consolidation within the imaged lung apices. No visible pneumothorax Other: Multiple thyroid nodules, the largest arising from the right lobe measuring 2.2 cm IMPRESSION: CT head: 1. No evidence of acute  intracranial abnormality. 2. Stable, moderate generalized cerebral atrophy. 3. Stable, mild cerebral white matter chronic small vessel ischemic disease. 4. Left frontal sinusitis.  Mild ethmoid sinus mucosal thickening. CT cervical spine: 1. No evidence of acute fracture to the cervical spine. 2. Minimal C4-C5 grade 1 anterolisthesis. 3. Cervical spondylosis greatest at C5-C6 and C6-C7 where prominent posterior disc osteophyte complexes contribute to multifactorial moderate/severe spinal canal stenosis with possible spinal cord mass effect. 4. Multiple thyroid nodules, the largest within the right lobe measuring 2.2 cm. Nonemergent thyroid ultrasound is recommended for further evaluation. Electronically Signed   By: Jackey LogeKyle  Golden DO   On: 07/06/2020 18:28   CT Cervical Spine Wo Contrast  Result Date: 07/06/2020 CLINICAL DATA:  Head trauma, minor. EXAM: CT HEAD WITHOUT CONTRAST CT CERVICAL SPINE WITHOUT CONTRAST TECHNIQUE: Multidetector CT imaging of the head and cervical spine was performed following the standard protocol without intravenous contrast. Multiplanar CT image reconstructions of the cervical spine were also generated. COMPARISON:  CT head/cervical spine 07/18/2018. FINDINGS: CT HEAD FINDINGS Brain: Stable, moderate generalized cerebral atrophy. Stable, mild ill-defined hypoattenuation within the cerebral white matter which is nonspecific, but likely reflects sequela of chronic small vessel ischemic disease. There is no acute intracranial hemorrhage. No demarcated cortical infarct. No extra-axial fluid collection. No evidence of intracranial mass. No midline shift. Vascular: No hyperdense vessel.  Atherosclerotic calcifications. Skull: Normal. Negative for fracture or focal lesion. Sinuses/Orbits: Visualized orbits show no acute finding. Mild ethmoid sinus mucosal thickening. Frothy secretions within the left frontal sinus. No significant mastoid effusion. CT CERVICAL SPINE FINDINGS Alignment: A mild  cervical levocurvature may be positional. Trace C4-C5 grade 1 anterolisthesis Skull base and vertebrae: The basion-dental and atlanto-dental intervals are maintained.No evidence of acute fracture to the cervical spine. 4 mm sclerotic focus within the left C4 articular pillar, nonspecific but possibly reflecting a bone island. Soft tissues and spinal canal: No prevertebral fluid or swelling. No visible canal hematoma. Disc levels: Cervical spondylosis. Most notably at C5-C6 and C6-C7 there is moderate disc degeneration with posterior disc osteophytes and uncovertebral hypertrophy. The C5-C6 posterior disc osteophyte contributes to moderate/severe spinal canal stenosis. Moderate/severe spinal canal stenosis at these levels with possible spinal cord mass effect. Upper chest: No consolidation within the imaged lung apices. No visible pneumothorax Other: Multiple thyroid nodules, the largest arising from the right lobe measuring 2.2 cm IMPRESSION: CT head: 1. No evidence of acute intracranial abnormality. 2. Stable, moderate generalized cerebral atrophy. 3. Stable, mild cerebral white matter chronic small vessel ischemic disease. 4. Left frontal sinusitis.  Mild ethmoid sinus mucosal thickening. CT cervical spine: 1. No evidence of acute fracture to the cervical spine. 2. Minimal C4-C5 grade 1 anterolisthesis. 3. Cervical spondylosis greatest at C5-C6 and C6-C7 where prominent posterior disc osteophyte complexes contribute to multifactorial moderate/severe spinal canal stenosis with possible spinal cord mass effect. 4. Multiple thyroid nodules, the largest within the right lobe measuring 2.2 cm. Nonemergent thyroid ultrasound is recommended for further evaluation. Electronically Signed   By: Jackey LogeKyle  Golden DO   On: 07/06/2020 18:28   [address]  Result Date: 07/27/2020 CLINICAL DATA:  Pt bib ptar after being found on the floor. Ccollar in place. Pt states she did not fall. However pt was on the tile floor in  the bathroom. Pt lives at home with her husband. Hx DM, afib, Parkinsons. C/o pain to R upper arm, denies chest complaints. EXAM: PORTABLE CHEST 1 VIEW COMPARISON:  05/17/2020 FINDINGS: Cardiac silhouette is normal in size. No mediastinal or hilar masses. Clear lungs.  No convincing pleural effusion or pneumothorax. Skeletal structures are grossly intact. IMPRESSION: No active disease. Electronically Signed   By: Amie Portland M.D.   On: 07/27/2020 15:14   DG Chest Portable 1 View  Result Date: 07/06/2020 CLINICAL DATA:  Weakness EXAM: PORTABLE CHEST 1 VIEW COMPARISON:  None. FINDINGS: Single frontal view of the chest demonstrates an unremarkable cardiac silhouette. No airspace disease, effusion, or pneumothorax. No acute bony abnormality. IMPRESSION: 1. No acute intrathoracic process. Electronically Signed   By: Sharlet Salina M.D.   On: 07/06/2020 21:49   DG Humerus Right  Result Date: 07/27/2020 CLINICAL DATA:  Pt bib ptar after being found on the floor. Ccollar in place. Pt states she did not fall. However pt was on the tile floor in the bathroom. Pt lives at home with her husband. Hx DM, afib, Parkinsons. C/o pain to R upper arm, denies chest complaints. EXAM: RIGHT HUMERUS - 2+ VIEW COMPARISON:  None. FINDINGS: No fracture or bone lesion. Shoulder and elbow joints are normally aligned. Soft tissues are unremarkable. IMPRESSION: No fracture or dislocation. Electronically Signed   By: Amie Portland M.D.   On: 07/27/2020 15:15     Subjective: No new complaints.  Discharge Exam: Vitals:   08/02/20 0803 08/02/20 1116  BP: 139/78 126/71  Pulse: (!) 56 (!) 55  Resp:  14  Temp:  98.5 F (36.9 C)  SpO2:  93%   Vitals:   08/01/20 2227 08/02/20 0411 08/02/20 0803 08/02/20 1116  BP: 130/68 (!) 147/63 139/78 126/71  Pulse: 65 (!) 58 (!) 56 (!) 55  Resp:  18  14  Temp:  98.1 F (36.7 C)  98.5 F (36.9 C)  TempSrc:  Oral  Oral  SpO2:  95%  93%  Weight:  87.9 kg    Height:         General: Pt is alert, awake, not in acute distress Cardiovascular: RRR, S1/S2 +, no rubs, no gallops Respiratory: CTA bilaterally, no wheezing, no rhonchi Abdominal: Soft, NT, ND, bowel sounds + Extremities: no edema, no cyanosis    The results of significant diagnostics from this hospitalization (including imaging, microbiology, ancillary and laboratory) are listed below for reference.     Microbiology: Recent Results (from the past 240 hour(s))  Respiratory Panel by RT PCR (Flu A&B, Covid) - Nasopharyngeal Swab     Status: None   Collection Time: 07/27/20  4:49 PM   Specimen: Nasopharyngeal Swab  Result Value Ref Range Status   SARS Coronavirus 2 by RT PCR NEGATIVE NEGATIVE Final    Comment: (NOTE) SARS-CoV-2 target nucleic acids are NOT DETECTED.  The SARS-CoV-2 RNA is generally detectable in upper respiratoy specimens during the acute phase of infection. The lowest concentration of SARS-CoV-2 viral copies this assay can detect is 131 copies/mL. A negative result does not preclude SARS-Cov-2 infection and should not be used as the sole basis for treatment or other patient management decisions. A negative result may occur with  improper specimen collection/handling, submission of specimen other than nasopharyngeal swab, presence of  viral mutation(s) within the areas targeted by this assay, and inadequate number of viral copies (<131 copies/mL). A negative result must be combined with clinical observations, patient history, and epidemiological information. The expected result is Negative.  Fact Sheet for Patients:  https://www.moore.com/  Fact Sheet for Healthcare Providers:  https://www.young.biz/  This test is no t yet approved or cleared by the [place] FDA and  has been authorized for detection and/or diagnosis of SARS-CoV-2 by FDA under an Emergency Use Authorization (EUA). This EUA will remain  in effect (meaning this  test can be used) for the duration of the COVID-19 declaration under Section 564(b)(1) of the Act, 21 U.S.C. section 360bbb-3(b)(1), unless the authorization is terminated or revoked sooner.     Influenza A by PCR NEGATIVE NEGATIVE Final   Influenza B by PCR NEGATIVE NEGATIVE Final    Comment: (NOTE) The Xpert Xpress SARS-CoV-2/FLU/RSV assay is intended as an aid in  the diagnosis of influenza from Nasopharyngeal swab specimens and  should not be used as a sole basis for treatment. Nasal washings and  aspirates are unacceptable for Xpert Xpress SARS-CoV-2/FLU/RSV  testing.  Fact Sheet for Patients: https://www.moore.com/  Fact Sheet for Healthcare Providers: https://www.young.biz/  This test is not yet approved or cleared by the [place] FDA and  has been authorized for detection and/or diagnosis of SARS-CoV-2 by  FDA under an Emergency Use Authorization (EUA). This EUA will remain  in effect (meaning this test can be used) for the duration of the  Covid-19 declaration under Section 564(b)(1) of the Act, 21  U.S.C. section 360bbb-3(b)(1), unless the authorization is  terminated or revoked. Performed at [facility], [address], [place], [place] [address]   SARS Coronavirus 2 by RT PCR (hospital order, performed in [facility] hospital lab) Nasopharyngeal Nasopharyngeal Swab     Status: None   Collection Time: 07/31/20  3:02 PM   Specimen: Nasopharyngeal Swab  Result Value Ref Range Status   SARS Coronavirus 2 NEGATIVE NEGATIVE Final    Comment: (NOTE) SARS-CoV-2 target nucleic acids are NOT DETECTED.  The SARS-CoV-2 RNA is generally detectable in upper and lower respiratory specimens during the acute phase of infection. The lowest concentration of SARS-CoV-2 viral copies this assay can detect is 250 copies / mL. A negative result does not preclude SARS-CoV-2 infection and should not be used as the sole basis for treatment  or other patient management decisions.  A negative result may occur with improper specimen collection / handling, submission of specimen other than nasopharyngeal swab, presence of viral mutation(s) within the areas targeted by this assay, and inadequate number of viral copies (<250 copies / mL). A negative result must be combined with clinical observations, patient history, and epidemiological information.  Fact Sheet for Patients:   BoilerBrush.com.cy  Fact Sheet for Healthcare Providers: https://pope.com/  This test is not yet approved or  cleared by the [place] FDA and has been authorized for detection and/or diagnosis of SARS-CoV-2 by FDA under an Emergency Use Authorization (EUA).  This EUA will remain in effect (meaning this test can be used) for the duration of the COVID-19 declaration under Section 564(b)(1) of the Act, 21 U.S.C. section 360bbb-3(b)(1), unless the authorization is terminated or revoked sooner.  Performed at [facility], [address] [place], [place] [address]      Labs: BNP (last 3 results) Recent Labs    07/27/20 1340  BNP 297.7*   Basic Metabolic Panel: Recent Labs  Lab 07/27/20 1340 07/28/20 0205 07/29/20 0327  NA 138 140 142  K 4.2 3.6 3.7  CL 103 106 108  CO2 25 24 25   GLUCOSE 157* 109* 109*  BUN 17 24* 19  CREATININE 1.03* 1.10* 1.03*  CALCIUM 9.4 8.9 8.9  MG  --   --  1.7  PHOS  --   --  3.4   Liver Function Tests: Recent Labs  Lab 07/27/20 1340 07/28/20 0205 07/29/20 0327  AST 43* 27  --   ALT 25 8  --   ALKPHOS 69 53  --   BILITOT 0.8 0.8  --   PROT 6.7 5.3*  --   ALBUMIN 3.5 2.8* 2.7*   No results for input(s): LIPASE, AMYLASE in the last 168 hours. No results for input(s): AMMONIA in the last 168 hours. CBC: Recent Labs  Lab 07/27/20 1340 07/28/20 0205 07/29/20 0327  WBC 11.5* 8.1 5.1  NEUTROABS 9.9*  --   --   HGB 12.6 10.7* 10.8*  HCT 40.3  34.3* 33.8*  MCV 86.9 86.4 86.7  PLT 228 197 174   Cardiac Enzymes: Recent Labs  Lab 07/27/20 1340 07/28/20 0205 07/29/20 0327  CKTOTAL 1,417* 787* 371*   BNP: Invalid input(s): POCBNP CBG: Recent Labs  Lab 08/01/20 0625 08/01/20 1210 08/01/20 1612 08/01/20 2236 08/02/20 0606  GLUCAP 127* 151* 151* 180* 113*   D-Dimer No results for input(s): DDIMER in the last 72 hours. Hgb A1c No results for input(s): HGBA1C in the last 72 hours. Lipid Profile No results for input(s): CHOL, HDL, LDLCALC, TRIG, CHOLHDL, LDLDIRECT in the last 72 hours. Thyroid function studies No results for input(s): TSH, T4TOTAL, T3FREE, THYROIDAB in the last 72 hours.  Invalid input(s): FREET3 Anemia work up No results for input(s): VITAMINB12, FOLATE, FERRITIN, TIBC, IRON, RETICCTPCT in the last 72 hours. Urinalysis    Component Value Date/Time   COLORURINE YELLOW 07/27/2020 1700   APPEARANCEUR CLEAR 07/27/2020 1700   LABSPEC 1.016 07/27/2020 1700   PHURINE 6.0 07/27/2020 1700   GLUCOSEU 50 (A) 07/27/2020 1700   HGBUR NEGATIVE 07/27/2020 1700   HGBUR negative 11/04/2010 0921   BILIRUBINUR NEGATIVE 07/27/2020 1700   BILIRUBINUR 1+ 09/27/2018 1156   KETONESUR 5 (A) 07/27/2020 1700   PROTEINUR NEGATIVE 07/27/2020 1700   UROBILINOGEN 1.0 09/27/2018 1156   UROBILINOGEN 0.2 11/04/2010 0921   NITRITE NEGATIVE 07/27/2020 1700   LEUKOCYTESUR NEGATIVE 07/27/2020 1700   Sepsis Labs Invalid input(s): PROCALCITONIN,  WBC,  LACTICIDVEN Microbiology Recent Results (from the past 240 hour(s))  Respiratory Panel by RT PCR (Flu A&B, Covid) - Nasopharyngeal Swab     Status: None   Collection Time: 07/27/20  4:49 PM   Specimen: Nasopharyngeal Swab  Result Value Ref Range Status   SARS Coronavirus 2 by RT PCR NEGATIVE NEGATIVE Final    Comment: (NOTE) SARS-CoV-2 target nucleic acids are NOT DETECTED.  The SARS-CoV-2 RNA is generally detectable in upper respiratoy specimens during the acute phase of  infection. The lowest concentration of SARS-CoV-2 viral copies this assay can detect is 131 copies/mL. A negative result does not preclude SARS-Cov-2 infection and should not be used as the sole basis for treatment or other patient management decisions. A negative result may occur with  improper specimen collection/handling, submission of specimen other than nasopharyngeal swab, presence of viral mutation(s) within the areas targeted by this assay, and inadequate number of viral copies (<131 copies/mL). A negative result must be combined with clinical observations, patient history, and epidemiological information.  The expected result is Negative.  Fact Sheet for Patients:  https://www.moore.com/  Fact Sheet for Healthcare Providers:  https://www.young.biz/  This test is no t yet approved or cleared by the [place] FDA and  has been authorized for detection and/or diagnosis of SARS-CoV-2 by FDA under an Emergency Use Authorization (EUA). This EUA will remain  in effect (meaning this test can be used) for the duration of the COVID-19 declaration under Section 564(b)(1) of the Act, 21 U.S.C. section 360bbb-3(b)(1), unless the authorization is terminated or revoked sooner.     Influenza A by PCR NEGATIVE NEGATIVE Final   Influenza B by PCR NEGATIVE NEGATIVE Final    Comment: (NOTE) The Xpert Xpress SARS-CoV-2/FLU/RSV assay is intended as an aid in  the diagnosis of influenza from Nasopharyngeal swab specimens and  should not be used as a sole basis for treatment. Nasal washings and  aspirates are unacceptable for Xpert Xpress SARS-CoV-2/FLU/RSV  testing.  Fact Sheet for Patients: https://www.moore.com/  Fact Sheet for Healthcare Providers: https://www.young.biz/  This test is not yet approved or cleared by the [place] FDA and  has been authorized for detection and/or diagnosis of SARS-CoV-2  by  FDA under an Emergency Use Authorization (EUA). This EUA will remain  in effect (meaning this test can be used) for the duration of the  Covid-19 declaration under Section 564(b)(1) of the Act, 21  U.S.C. section 360bbb-3(b)(1), unless the authorization is  terminated or revoked. Performed at [facility], [address] [place], [place] [address]   SARS Coronavirus 2 by RT PCR (hospital order, performed in [facility] hospital lab) Nasopharyngeal Nasopharyngeal Swab     Status: None   Collection Time: 07/31/20  3:02 PM   Specimen: Nasopharyngeal Swab  Result Value Ref Range Status   SARS Coronavirus 2 NEGATIVE NEGATIVE Final    Comment: (NOTE) SARS-CoV-2 target nucleic acids are NOT DETECTED.  The SARS-CoV-2 RNA is generally detectable in upper and lower respiratory specimens during the acute phase of infection. The lowest concentration of SARS-CoV-2 viral copies this assay can detect is 250 copies / mL. A negative result does not preclude SARS-CoV-2 infection and should not be used as the sole basis for treatment or other patient management decisions.  A negative result may occur with improper specimen collection / handling, submission of specimen other than nasopharyngeal swab, presence of viral mutation(s) within the areas targeted by this assay, and inadequate number of viral copies (<250 copies / mL). A negative result must be combined with clinical observations, patient history, and epidemiological information.  Fact Sheet for Patients:   BoilerBrush.com.cy  Fact Sheet for Healthcare Providers: https://pope.com/  This test is not yet approved or  cleared by the [place] FDA and has been authorized for detection and/or diagnosis of SARS-CoV-2 by FDA under an Emergency Use Authorization (EUA).  This EUA will remain in effect (meaning this test can be used) for the duration of the COVID-19 declaration under  Section 564(b)(1) of the Act, 21 U.S.C. section 360bbb-3(b)(1), unless the authorization is terminated or revoked sooner.  Performed at [facility], [address], [place], [place] [address]      Time coordinating discharge: Over 40 minutes  SIGNED:   Marinda Elk, MD  [facility] 08/02/2020, 11:24 AM Pager   If 7PM-7AM, please contact night-coverage [email or web address] Password [email or web address]

## 2020-08-02 NOTE — Plan of Care (Signed)
  Problem: Education: Goal: Knowledge of General Education information will improve Description: Including pain rating scale, medication(s)/side effects and non-pharmacologic comfort measures Outcome: Adequate for Discharge   Problem: Health Behavior/Discharge Planning: Goal: Ability to manage health-related needs will improve Outcome: Adequate for Discharge   Problem: Clinical Measurements: Goal: Ability to maintain clinical measurements within normal limits will improve Outcome: Adequate for Discharge Goal: Will remain free from infection Outcome: Adequate for Discharge   Problem: Activity: Goal: Risk for activity intolerance will decrease Outcome: Adequate for Discharge   Problem: Pain Managment: Goal: General experience of comfort will improve Outcome: Adequate for Discharge   Problem: Safety: Goal: Ability to remain free from injury will improve Outcome: Adequate for Discharge   Problem: Skin Integrity: Goal: Risk for impaired skin integrity will decrease Outcome: Adequate for Discharge

## 2020-08-02 NOTE — TOC Transition Note (Signed)
Transition of Care [facility]) - CM/SW Discharge Note   Patient Details  Name: Sherri Pratt MRN: [id number] Date of Birth: [birth year]  [facility] CM/SW Contact:  Carmina Miller, LCSWA Phone Number: 08/02/2020, 11:40 AM   Clinical Narrative:    [name] Anticipated DC date:  08/02/20 Family notified: Hulda Humphrey Transport by:  Sharin Mons   Per MD patient ready for DC to [name]. RN to call report prior to discharge [phone], room 806A. RN, patient, patient's family, and facility notified of DC. Discharge Summary and FL2 sent to facility. DC packet on chart. Ambulance transport requested for patient.   CSW will sign off for now as social work intervention is no longer needed. Please consult [place] again if new needs arise.   Final next level of care: Skilled Nursing Facility Barriers to Discharge: Barriers Resolved   Patient Goals and CMS Choice Patient states their goals for this hospitalization and ongoing recovery are:: To get better soon CMS Medicare.gov Compare Post Acute Care list provided to:: Patient Represenative (must comment) Trey Paula, son [phone]) Choice offered to / list presented to : Patient, Adult Children  Discharge Placement PASRR number recieved: 04/17/17            [name] Patient to be transferred to facility by: [facility] Name of family member notified: Hulda Humphrey Patient and family notified of of transfer: 08/02/20  Discharge Plan and Services                                     Social Determinants of Health (SDOH) Interventions     Readmission Risk Interventions Readmission Risk Prevention Plan 07/10/2020  Post Dischage Appt Complete  Medication Screening Complete  Transportation Screening Complete  Some recent data might be hidden

## 2020-08-02 NOTE — Progress Notes (Signed)
Spoke and gave report to [place], Charity fundraiser of [employer]. RN does not verbalizes any questions.

## 2020-08-02 NOTE — Progress Notes (Signed)
Attempted to call to [employer]. Voicemail box is full. Unable to leave a voicemail at this time. Will try again in 30 mins.

## 2020-08-03 DIAGNOSIS — N179 Acute kidney failure, unspecified: Secondary | ICD-10-CM | POA: Diagnosis not present

## 2020-08-03 DIAGNOSIS — R296 Repeated falls: Secondary | ICD-10-CM | POA: Diagnosis not present

## 2020-08-03 DIAGNOSIS — I480 Paroxysmal atrial fibrillation: Secondary | ICD-10-CM | POA: Diagnosis not present

## 2020-08-03 DIAGNOSIS — M6282 Rhabdomyolysis: Secondary | ICD-10-CM | POA: Diagnosis not present

## 2020-08-10 DIAGNOSIS — M6282 Rhabdomyolysis: Secondary | ICD-10-CM | POA: Diagnosis not present

## 2020-08-10 DIAGNOSIS — N179 Acute kidney failure, unspecified: Secondary | ICD-10-CM | POA: Diagnosis not present

## 2020-08-10 DIAGNOSIS — I480 Paroxysmal atrial fibrillation: Secondary | ICD-10-CM | POA: Diagnosis not present

## 2020-08-10 DIAGNOSIS — R296 Repeated falls: Secondary | ICD-10-CM | POA: Diagnosis not present

## 2020-08-17 DIAGNOSIS — N179 Acute kidney failure, unspecified: Secondary | ICD-10-CM | POA: Diagnosis not present

## 2020-08-17 DIAGNOSIS — I480 Paroxysmal atrial fibrillation: Secondary | ICD-10-CM | POA: Diagnosis not present

## 2020-08-17 DIAGNOSIS — M6282 Rhabdomyolysis: Secondary | ICD-10-CM | POA: Diagnosis not present

## 2020-08-17 DIAGNOSIS — R296 Repeated falls: Secondary | ICD-10-CM | POA: Diagnosis not present

## 2020-08-23 ENCOUNTER — Other Ambulatory Visit: Payer: Self-pay | Admitting: Cardiovascular Disease

## 2020-08-23 ENCOUNTER — Other Ambulatory Visit: Payer: Self-pay | Admitting: Adult Health

## 2020-08-23 DIAGNOSIS — E11649 Type 2 diabetes mellitus with hypoglycemia without coma: Secondary | ICD-10-CM

## 2020-09-10 DIAGNOSIS — I480 Paroxysmal atrial fibrillation: Secondary | ICD-10-CM | POA: Diagnosis not present

## 2020-09-10 DIAGNOSIS — I10 Essential (primary) hypertension: Secondary | ICD-10-CM | POA: Diagnosis not present

## 2020-09-10 DIAGNOSIS — G20 Parkinson's disease: Secondary | ICD-10-CM | POA: Diagnosis not present

## 2020-09-10 DIAGNOSIS — E119 Type 2 diabetes mellitus without complications: Secondary | ICD-10-CM | POA: Diagnosis not present

## 2020-09-12 DIAGNOSIS — E782 Mixed hyperlipidemia: Secondary | ICD-10-CM | POA: Diagnosis not present

## 2020-09-12 DIAGNOSIS — E119 Type 2 diabetes mellitus without complications: Secondary | ICD-10-CM | POA: Diagnosis not present

## 2020-09-28 DIAGNOSIS — I10 Essential (primary) hypertension: Secondary | ICD-10-CM | POA: Diagnosis not present

## 2020-09-28 DIAGNOSIS — E119 Type 2 diabetes mellitus without complications: Secondary | ICD-10-CM | POA: Diagnosis not present

## 2020-09-28 DIAGNOSIS — G20 Parkinson's disease: Secondary | ICD-10-CM | POA: Diagnosis not present

## 2020-09-28 DIAGNOSIS — I480 Paroxysmal atrial fibrillation: Secondary | ICD-10-CM | POA: Diagnosis not present

## 2020-10-02 DIAGNOSIS — G20 Parkinson's disease: Secondary | ICD-10-CM | POA: Diagnosis not present

## 2020-10-02 DIAGNOSIS — E119 Type 2 diabetes mellitus without complications: Secondary | ICD-10-CM | POA: Diagnosis not present

## 2020-10-02 DIAGNOSIS — R5381 Other malaise: Secondary | ICD-10-CM | POA: Diagnosis not present

## 2020-10-03 DIAGNOSIS — E7800 Pure hypercholesterolemia, unspecified: Secondary | ICD-10-CM | POA: Diagnosis not present

## 2020-10-03 DIAGNOSIS — Z13220 Encounter for screening for lipoid disorders: Secondary | ICD-10-CM | POA: Diagnosis not present

## 2020-10-03 DIAGNOSIS — R7309 Other abnormal glucose: Secondary | ICD-10-CM | POA: Diagnosis not present

## 2020-10-11 DIAGNOSIS — R799 Abnormal finding of blood chemistry, unspecified: Secondary | ICD-10-CM | POA: Diagnosis not present

## 2020-10-26 DIAGNOSIS — D508 Other iron deficiency anemias: Secondary | ICD-10-CM | POA: Diagnosis not present

## 2020-10-26 DIAGNOSIS — E119 Type 2 diabetes mellitus without complications: Secondary | ICD-10-CM | POA: Diagnosis not present

## 2020-10-26 DIAGNOSIS — G20 Parkinson's disease: Secondary | ICD-10-CM | POA: Diagnosis not present

## 2020-10-26 DIAGNOSIS — I480 Paroxysmal atrial fibrillation: Secondary | ICD-10-CM | POA: Diagnosis not present

## 2020-10-27 DIAGNOSIS — I10 Essential (primary) hypertension: Secondary | ICD-10-CM | POA: Diagnosis not present

## 2020-10-27 DIAGNOSIS — N179 Acute kidney failure, unspecified: Secondary | ICD-10-CM | POA: Diagnosis not present

## 2020-10-27 DIAGNOSIS — D519 Vitamin B12 deficiency anemia, unspecified: Secondary | ICD-10-CM | POA: Diagnosis not present

## 2020-10-31 DIAGNOSIS — I480 Paroxysmal atrial fibrillation: Secondary | ICD-10-CM | POA: Diagnosis not present

## 2020-10-31 DIAGNOSIS — E119 Type 2 diabetes mellitus without complications: Secondary | ICD-10-CM | POA: Diagnosis not present

## 2020-10-31 DIAGNOSIS — G20 Parkinson's disease: Secondary | ICD-10-CM | POA: Diagnosis not present

## 2020-11-08 ENCOUNTER — Telehealth: Payer: Self-pay | Admitting: Adult Health

## 2020-11-08 NOTE — Telephone Encounter (Signed)
Left message for patient to call back and schedule Medicare Annual Wellness Visit (AWV) either virtually or in office.   Last AWV 11/13/14  please schedule at anytime with [facility] Nurse Health Advisor 1 or 2   This should be a 45 minute visit.

## 2020-11-23 DIAGNOSIS — I480 Paroxysmal atrial fibrillation: Secondary | ICD-10-CM | POA: Diagnosis not present

## 2020-11-23 DIAGNOSIS — D508 Other iron deficiency anemias: Secondary | ICD-10-CM | POA: Diagnosis not present

## 2020-11-23 DIAGNOSIS — E119 Type 2 diabetes mellitus without complications: Secondary | ICD-10-CM | POA: Diagnosis not present

## 2020-11-23 DIAGNOSIS — G20 Parkinson's disease: Secondary | ICD-10-CM | POA: Diagnosis not present

## 2020-11-27 DIAGNOSIS — E119 Type 2 diabetes mellitus without complications: Secondary | ICD-10-CM | POA: Diagnosis not present

## 2020-11-27 DIAGNOSIS — B351 Tinea unguium: Secondary | ICD-10-CM | POA: Diagnosis not present

## 2020-11-30 DIAGNOSIS — E119 Type 2 diabetes mellitus without complications: Secondary | ICD-10-CM | POA: Diagnosis not present

## 2020-11-30 DIAGNOSIS — I10 Essential (primary) hypertension: Secondary | ICD-10-CM | POA: Diagnosis not present

## 2020-11-30 DIAGNOSIS — I480 Paroxysmal atrial fibrillation: Secondary | ICD-10-CM | POA: Diagnosis not present

## 2020-11-30 DIAGNOSIS — R296 Repeated falls: Secondary | ICD-10-CM | POA: Diagnosis not present

## 2020-12-20 DIAGNOSIS — D519 Vitamin B12 deficiency anemia, unspecified: Secondary | ICD-10-CM | POA: Diagnosis not present

## 2020-12-20 DIAGNOSIS — E119 Type 2 diabetes mellitus without complications: Secondary | ICD-10-CM | POA: Diagnosis not present

## 2020-12-20 DIAGNOSIS — R8290 Unspecified abnormal findings in urine: Secondary | ICD-10-CM | POA: Diagnosis not present

## 2020-12-21 DIAGNOSIS — I480 Paroxysmal atrial fibrillation: Secondary | ICD-10-CM | POA: Diagnosis not present

## 2020-12-21 DIAGNOSIS — D508 Other iron deficiency anemias: Secondary | ICD-10-CM | POA: Diagnosis not present

## 2020-12-21 DIAGNOSIS — E119 Type 2 diabetes mellitus without complications: Secondary | ICD-10-CM | POA: Diagnosis not present

## 2020-12-21 DIAGNOSIS — G20 Parkinson's disease: Secondary | ICD-10-CM | POA: Diagnosis not present

## 2020-12-29 DIAGNOSIS — E119 Type 2 diabetes mellitus without complications: Secondary | ICD-10-CM | POA: Diagnosis not present

## 2020-12-29 DIAGNOSIS — R5381 Other malaise: Secondary | ICD-10-CM | POA: Diagnosis not present

## 2020-12-29 DIAGNOSIS — I480 Paroxysmal atrial fibrillation: Secondary | ICD-10-CM | POA: Diagnosis not present

## 2020-12-29 DIAGNOSIS — I10 Essential (primary) hypertension: Secondary | ICD-10-CM | POA: Diagnosis not present

## 2021-01-10 DIAGNOSIS — N179 Acute kidney failure, unspecified: Secondary | ICD-10-CM | POA: Diagnosis not present

## 2021-01-10 DIAGNOSIS — E119 Type 2 diabetes mellitus without complications: Secondary | ICD-10-CM | POA: Diagnosis not present

## 2021-01-10 DIAGNOSIS — R8290 Unspecified abnormal findings in urine: Secondary | ICD-10-CM | POA: Diagnosis not present

## 2021-01-28 DIAGNOSIS — I480 Paroxysmal atrial fibrillation: Secondary | ICD-10-CM | POA: Diagnosis not present

## 2021-01-28 DIAGNOSIS — R296 Repeated falls: Secondary | ICD-10-CM | POA: Diagnosis not present

## 2021-01-28 DIAGNOSIS — E119 Type 2 diabetes mellitus without complications: Secondary | ICD-10-CM | POA: Diagnosis not present

## 2021-01-28 DIAGNOSIS — I10 Essential (primary) hypertension: Secondary | ICD-10-CM | POA: Diagnosis not present

## 2021-02-01 DIAGNOSIS — E119 Type 2 diabetes mellitus without complications: Secondary | ICD-10-CM | POA: Diagnosis not present

## 2021-02-01 DIAGNOSIS — I10 Essential (primary) hypertension: Secondary | ICD-10-CM | POA: Diagnosis not present

## 2021-02-01 DIAGNOSIS — G20 Parkinson's disease: Secondary | ICD-10-CM | POA: Diagnosis not present

## 2021-02-01 DIAGNOSIS — I480 Paroxysmal atrial fibrillation: Secondary | ICD-10-CM | POA: Diagnosis not present

## 2021-02-25 DIAGNOSIS — I10 Essential (primary) hypertension: Secondary | ICD-10-CM | POA: Diagnosis not present

## 2021-02-25 DIAGNOSIS — I480 Paroxysmal atrial fibrillation: Secondary | ICD-10-CM | POA: Diagnosis not present

## 2021-02-25 DIAGNOSIS — R296 Repeated falls: Secondary | ICD-10-CM | POA: Diagnosis not present

## 2021-02-25 DIAGNOSIS — G4700 Insomnia, unspecified: Secondary | ICD-10-CM | POA: Diagnosis not present

## 2021-03-01 DIAGNOSIS — I480 Paroxysmal atrial fibrillation: Secondary | ICD-10-CM | POA: Diagnosis not present

## 2021-03-01 DIAGNOSIS — G20 Parkinson's disease: Secondary | ICD-10-CM | POA: Diagnosis not present

## 2021-03-01 DIAGNOSIS — I10 Essential (primary) hypertension: Secondary | ICD-10-CM | POA: Diagnosis not present

## 2021-03-01 DIAGNOSIS — E119 Type 2 diabetes mellitus without complications: Secondary | ICD-10-CM | POA: Diagnosis not present

## 2021-03-20 ENCOUNTER — Telehealth: Payer: Self-pay | Admitting: Adult Health

## 2021-03-20 NOTE — Telephone Encounter (Signed)
Left message for patient to call back and schedule Medicare Annual Wellness Visit (AWV) either virtually or in office.    awvi 11/13/10 per [place]   please schedule at anytime with [facility] Nurse Health Advisor 1 or 2   This should be a 45 minute visit.  

## 2021-03-25 DIAGNOSIS — E119 Type 2 diabetes mellitus without complications: Secondary | ICD-10-CM | POA: Diagnosis not present

## 2021-03-25 DIAGNOSIS — G20 Parkinson's disease: Secondary | ICD-10-CM | POA: Diagnosis not present

## 2021-03-25 DIAGNOSIS — I480 Paroxysmal atrial fibrillation: Secondary | ICD-10-CM | POA: Diagnosis not present

## 2021-03-25 DIAGNOSIS — I10 Essential (primary) hypertension: Secondary | ICD-10-CM | POA: Diagnosis not present

## 2021-04-03 DIAGNOSIS — E1142 Type 2 diabetes mellitus with diabetic polyneuropathy: Secondary | ICD-10-CM | POA: Diagnosis not present

## 2021-04-03 DIAGNOSIS — I10 Essential (primary) hypertension: Secondary | ICD-10-CM | POA: Diagnosis not present

## 2021-04-03 DIAGNOSIS — I480 Paroxysmal atrial fibrillation: Secondary | ICD-10-CM | POA: Diagnosis not present

## 2021-04-03 DIAGNOSIS — G20 Parkinson's disease: Secondary | ICD-10-CM | POA: Diagnosis not present

## 2021-04-23 ENCOUNTER — Telehealth: Payer: Self-pay | Admitting: Adult Health

## 2021-04-23 DIAGNOSIS — I480 Paroxysmal atrial fibrillation: Secondary | ICD-10-CM | POA: Diagnosis not present

## 2021-04-23 DIAGNOSIS — G20 Parkinson's disease: Secondary | ICD-10-CM | POA: Diagnosis not present

## 2021-04-23 DIAGNOSIS — M1A9XX0 Chronic gout, unspecified, without tophus (tophi): Secondary | ICD-10-CM | POA: Diagnosis not present

## 2021-04-23 DIAGNOSIS — E119 Type 2 diabetes mellitus without complications: Secondary | ICD-10-CM | POA: Diagnosis not present

## 2021-04-23 NOTE — Telephone Encounter (Signed)
Left message for patient to call back and schedule Medicare Annual Wellness Visit (AWV) either virtually or in office.    awvi 11/13/10 per [place]   please schedule at anytime with [facility] Nurse Health Advisor 1 or 2   This should be a 45 minute visit.

## 2021-05-03 DIAGNOSIS — I480 Paroxysmal atrial fibrillation: Secondary | ICD-10-CM | POA: Diagnosis not present

## 2021-05-03 DIAGNOSIS — I10 Essential (primary) hypertension: Secondary | ICD-10-CM | POA: Diagnosis not present

## 2021-05-03 DIAGNOSIS — G20 Parkinson's disease: Secondary | ICD-10-CM | POA: Diagnosis not present

## 2021-05-03 DIAGNOSIS — E1142 Type 2 diabetes mellitus with diabetic polyneuropathy: Secondary | ICD-10-CM | POA: Diagnosis not present

## 2021-05-04 DIAGNOSIS — N390 Urinary tract infection, site not specified: Secondary | ICD-10-CM | POA: Diagnosis not present

## 2021-05-09 DIAGNOSIS — E11649 Type 2 diabetes mellitus with hypoglycemia without coma: Secondary | ICD-10-CM | POA: Diagnosis not present

## 2021-05-09 DIAGNOSIS — G9341 Metabolic encephalopathy: Secondary | ICD-10-CM | POA: Diagnosis not present

## 2021-05-09 DIAGNOSIS — M6282 Rhabdomyolysis: Secondary | ICD-10-CM | POA: Diagnosis not present

## 2021-05-09 DIAGNOSIS — G20 Parkinson's disease: Secondary | ICD-10-CM | POA: Diagnosis not present

## 2021-05-09 DIAGNOSIS — N179 Acute kidney failure, unspecified: Secondary | ICD-10-CM | POA: Diagnosis not present

## 2021-05-09 DIAGNOSIS — I480 Paroxysmal atrial fibrillation: Secondary | ICD-10-CM | POA: Diagnosis not present

## 2021-05-11 DIAGNOSIS — I480 Paroxysmal atrial fibrillation: Secondary | ICD-10-CM | POA: Diagnosis not present

## 2021-05-11 DIAGNOSIS — G20 Parkinson's disease: Secondary | ICD-10-CM | POA: Diagnosis not present

## 2021-05-11 DIAGNOSIS — N179 Acute kidney failure, unspecified: Secondary | ICD-10-CM | POA: Diagnosis not present

## 2021-05-11 DIAGNOSIS — E11649 Type 2 diabetes mellitus with hypoglycemia without coma: Secondary | ICD-10-CM | POA: Diagnosis not present

## 2021-05-11 DIAGNOSIS — G9341 Metabolic encephalopathy: Secondary | ICD-10-CM | POA: Diagnosis not present

## 2021-05-11 DIAGNOSIS — M6282 Rhabdomyolysis: Secondary | ICD-10-CM | POA: Diagnosis not present

## 2021-05-13 DIAGNOSIS — R2681 Unsteadiness on feet: Secondary | ICD-10-CM | POA: Diagnosis not present

## 2021-05-13 DIAGNOSIS — N179 Acute kidney failure, unspecified: Secondary | ICD-10-CM | POA: Diagnosis not present

## 2021-05-13 DIAGNOSIS — G9341 Metabolic encephalopathy: Secondary | ICD-10-CM | POA: Diagnosis not present

## 2021-05-13 DIAGNOSIS — G20 Parkinson's disease: Secondary | ICD-10-CM | POA: Diagnosis not present

## 2021-05-13 DIAGNOSIS — I480 Paroxysmal atrial fibrillation: Secondary | ICD-10-CM | POA: Diagnosis not present

## 2021-05-14 DIAGNOSIS — R2681 Unsteadiness on feet: Secondary | ICD-10-CM | POA: Diagnosis not present

## 2021-05-14 DIAGNOSIS — I480 Paroxysmal atrial fibrillation: Secondary | ICD-10-CM | POA: Diagnosis not present

## 2021-05-14 DIAGNOSIS — N179 Acute kidney failure, unspecified: Secondary | ICD-10-CM | POA: Diagnosis not present

## 2021-05-14 DIAGNOSIS — G9341 Metabolic encephalopathy: Secondary | ICD-10-CM | POA: Diagnosis not present

## 2021-05-14 DIAGNOSIS — G20 Parkinson's disease: Secondary | ICD-10-CM | POA: Diagnosis not present

## 2021-05-15 DIAGNOSIS — G9341 Metabolic encephalopathy: Secondary | ICD-10-CM | POA: Diagnosis not present

## 2021-05-15 DIAGNOSIS — G20 Parkinson's disease: Secondary | ICD-10-CM | POA: Diagnosis not present

## 2021-05-15 DIAGNOSIS — R2681 Unsteadiness on feet: Secondary | ICD-10-CM | POA: Diagnosis not present

## 2021-05-15 DIAGNOSIS — N179 Acute kidney failure, unspecified: Secondary | ICD-10-CM | POA: Diagnosis not present

## 2021-05-15 DIAGNOSIS — I480 Paroxysmal atrial fibrillation: Secondary | ICD-10-CM | POA: Diagnosis not present

## 2021-05-20 DIAGNOSIS — E119 Type 2 diabetes mellitus without complications: Secondary | ICD-10-CM | POA: Diagnosis not present

## 2021-05-20 DIAGNOSIS — I480 Paroxysmal atrial fibrillation: Secondary | ICD-10-CM | POA: Diagnosis not present

## 2021-05-20 DIAGNOSIS — R296 Repeated falls: Secondary | ICD-10-CM | POA: Diagnosis not present

## 2021-05-20 DIAGNOSIS — I10 Essential (primary) hypertension: Secondary | ICD-10-CM | POA: Diagnosis not present

## 2021-05-31 DIAGNOSIS — I480 Paroxysmal atrial fibrillation: Secondary | ICD-10-CM | POA: Diagnosis not present

## 2021-05-31 DIAGNOSIS — D508 Other iron deficiency anemias: Secondary | ICD-10-CM | POA: Diagnosis not present

## 2021-05-31 DIAGNOSIS — E119 Type 2 diabetes mellitus without complications: Secondary | ICD-10-CM | POA: Diagnosis not present

## 2021-05-31 DIAGNOSIS — G20 Parkinson's disease: Secondary | ICD-10-CM | POA: Diagnosis not present

## 2021-06-24 ENCOUNTER — Telehealth: Payer: Self-pay | Admitting: Adult Health

## 2021-06-24 NOTE — Telephone Encounter (Signed)
Left message for patient to call back and schedule Medicare Annual Wellness Visit (AWV) either virtually or in office. Left  my Zachery Conch number [phone]   awvi 11/13/10 per [place]   please schedule at anytime with [facility] Nurse Health Advisor 1 or 2   This should be a 45 minute visit.   Patient also needs appointment with pcp  Last appt  05/17/20

## 2021-06-25 DIAGNOSIS — G20 Parkinson's disease: Secondary | ICD-10-CM | POA: Diagnosis not present

## 2021-06-25 DIAGNOSIS — E119 Type 2 diabetes mellitus without complications: Secondary | ICD-10-CM | POA: Diagnosis not present

## 2021-06-25 DIAGNOSIS — I480 Paroxysmal atrial fibrillation: Secondary | ICD-10-CM | POA: Diagnosis not present

## 2021-06-25 DIAGNOSIS — R296 Repeated falls: Secondary | ICD-10-CM | POA: Diagnosis not present

## 2021-06-28 DIAGNOSIS — G20 Parkinson's disease: Secondary | ICD-10-CM | POA: Diagnosis not present

## 2021-06-28 DIAGNOSIS — I10 Essential (primary) hypertension: Secondary | ICD-10-CM | POA: Diagnosis not present

## 2021-06-28 DIAGNOSIS — I480 Paroxysmal atrial fibrillation: Secondary | ICD-10-CM | POA: Diagnosis not present

## 2021-06-28 DIAGNOSIS — E119 Type 2 diabetes mellitus without complications: Secondary | ICD-10-CM | POA: Diagnosis not present

## 2021-07-16 DIAGNOSIS — E1159 Type 2 diabetes mellitus with other circulatory complications: Secondary | ICD-10-CM | POA: Diagnosis not present

## 2021-07-22 DIAGNOSIS — R5381 Other malaise: Secondary | ICD-10-CM | POA: Diagnosis not present

## 2021-07-22 DIAGNOSIS — E119 Type 2 diabetes mellitus without complications: Secondary | ICD-10-CM | POA: Diagnosis not present

## 2021-07-22 DIAGNOSIS — G20 Parkinson's disease: Secondary | ICD-10-CM | POA: Diagnosis not present

## 2021-07-22 DIAGNOSIS — I480 Paroxysmal atrial fibrillation: Secondary | ICD-10-CM | POA: Diagnosis not present

## 2021-07-26 DIAGNOSIS — I480 Paroxysmal atrial fibrillation: Secondary | ICD-10-CM | POA: Diagnosis not present

## 2021-07-26 DIAGNOSIS — E1142 Type 2 diabetes mellitus with diabetic polyneuropathy: Secondary | ICD-10-CM | POA: Diagnosis not present

## 2021-07-26 DIAGNOSIS — I10 Essential (primary) hypertension: Secondary | ICD-10-CM | POA: Diagnosis not present

## 2021-07-26 DIAGNOSIS — G20 Parkinson's disease: Secondary | ICD-10-CM | POA: Diagnosis not present

## 2021-07-29 ENCOUNTER — Telehealth: Payer: Self-pay

## 2021-07-29 ENCOUNTER — Ambulatory Visit: Payer: Medicare Other

## 2021-07-29 NOTE — Telephone Encounter (Signed)
Called patient x 3 for 815 Mwv , No answer,  Patient may reschedule next available appointment  L.Kurstyn Larios,LPN .

## 2021-07-30 ENCOUNTER — Ambulatory Visit: Payer: Medicare Other

## 2021-07-30 ENCOUNTER — Other Ambulatory Visit: Payer: Self-pay

## 2021-08-01 DIAGNOSIS — E119 Type 2 diabetes mellitus without complications: Secondary | ICD-10-CM | POA: Diagnosis not present

## 2021-08-12 DIAGNOSIS — I480 Paroxysmal atrial fibrillation: Secondary | ICD-10-CM | POA: Diagnosis not present

## 2021-08-12 DIAGNOSIS — G9341 Metabolic encephalopathy: Secondary | ICD-10-CM | POA: Diagnosis not present

## 2021-08-12 DIAGNOSIS — E11649 Type 2 diabetes mellitus with hypoglycemia without coma: Secondary | ICD-10-CM | POA: Diagnosis not present

## 2021-08-12 DIAGNOSIS — M6281 Muscle weakness (generalized): Secondary | ICD-10-CM | POA: Diagnosis not present

## 2021-08-12 DIAGNOSIS — G20 Parkinson's disease: Secondary | ICD-10-CM | POA: Diagnosis not present

## 2021-08-12 DIAGNOSIS — Z7409 Other reduced mobility: Secondary | ICD-10-CM | POA: Diagnosis not present

## 2021-08-12 DIAGNOSIS — N179 Acute kidney failure, unspecified: Secondary | ICD-10-CM | POA: Diagnosis not present

## 2021-08-13 DIAGNOSIS — G9341 Metabolic encephalopathy: Secondary | ICD-10-CM | POA: Diagnosis not present

## 2021-08-13 DIAGNOSIS — N179 Acute kidney failure, unspecified: Secondary | ICD-10-CM | POA: Diagnosis not present

## 2021-08-13 DIAGNOSIS — I4819 Other persistent atrial fibrillation: Secondary | ICD-10-CM | POA: Diagnosis not present

## 2021-08-13 DIAGNOSIS — R2243 Localized swelling, mass and lump, lower limb, bilateral: Secondary | ICD-10-CM | POA: Diagnosis not present

## 2021-08-13 DIAGNOSIS — G4452 New daily persistent headache (NDPH): Secondary | ICD-10-CM | POA: Diagnosis not present

## 2021-08-13 DIAGNOSIS — I272 Pulmonary hypertension, unspecified: Secondary | ICD-10-CM | POA: Diagnosis not present

## 2021-08-13 DIAGNOSIS — G20 Parkinson's disease: Secondary | ICD-10-CM | POA: Diagnosis not present

## 2021-08-13 DIAGNOSIS — U071 COVID-19: Secondary | ICD-10-CM | POA: Diagnosis not present

## 2021-08-13 DIAGNOSIS — E11649 Type 2 diabetes mellitus with hypoglycemia without coma: Secondary | ICD-10-CM | POA: Diagnosis not present

## 2021-08-13 DIAGNOSIS — I480 Paroxysmal atrial fibrillation: Secondary | ICD-10-CM | POA: Diagnosis not present

## 2021-08-14 DIAGNOSIS — G4452 New daily persistent headache (NDPH): Secondary | ICD-10-CM | POA: Diagnosis not present

## 2021-08-14 DIAGNOSIS — U071 COVID-19: Secondary | ICD-10-CM | POA: Diagnosis not present

## 2021-08-14 DIAGNOSIS — G9341 Metabolic encephalopathy: Secondary | ICD-10-CM | POA: Diagnosis not present

## 2021-08-14 DIAGNOSIS — R2243 Localized swelling, mass and lump, lower limb, bilateral: Secondary | ICD-10-CM | POA: Diagnosis not present

## 2021-08-14 DIAGNOSIS — E11649 Type 2 diabetes mellitus with hypoglycemia without coma: Secondary | ICD-10-CM | POA: Diagnosis not present

## 2021-08-14 DIAGNOSIS — N179 Acute kidney failure, unspecified: Secondary | ICD-10-CM | POA: Diagnosis not present

## 2021-08-14 DIAGNOSIS — G20 Parkinson's disease: Secondary | ICD-10-CM | POA: Diagnosis not present

## 2021-08-14 DIAGNOSIS — I272 Pulmonary hypertension, unspecified: Secondary | ICD-10-CM | POA: Diagnosis not present

## 2021-08-14 DIAGNOSIS — I4819 Other persistent atrial fibrillation: Secondary | ICD-10-CM | POA: Diagnosis not present

## 2021-08-14 DIAGNOSIS — I480 Paroxysmal atrial fibrillation: Secondary | ICD-10-CM | POA: Diagnosis not present

## 2021-08-15 DIAGNOSIS — I272 Pulmonary hypertension, unspecified: Secondary | ICD-10-CM | POA: Diagnosis not present

## 2021-08-15 DIAGNOSIS — I4819 Other persistent atrial fibrillation: Secondary | ICD-10-CM | POA: Diagnosis not present

## 2021-08-15 DIAGNOSIS — U071 COVID-19: Secondary | ICD-10-CM | POA: Diagnosis not present

## 2021-08-15 DIAGNOSIS — G4452 New daily persistent headache (NDPH): Secondary | ICD-10-CM | POA: Diagnosis not present

## 2021-08-15 DIAGNOSIS — I480 Paroxysmal atrial fibrillation: Secondary | ICD-10-CM | POA: Diagnosis not present

## 2021-08-15 DIAGNOSIS — R2243 Localized swelling, mass and lump, lower limb, bilateral: Secondary | ICD-10-CM | POA: Diagnosis not present

## 2021-08-15 DIAGNOSIS — G9341 Metabolic encephalopathy: Secondary | ICD-10-CM | POA: Diagnosis not present

## 2021-08-15 DIAGNOSIS — E11649 Type 2 diabetes mellitus with hypoglycemia without coma: Secondary | ICD-10-CM | POA: Diagnosis not present

## 2021-08-15 DIAGNOSIS — N179 Acute kidney failure, unspecified: Secondary | ICD-10-CM | POA: Diagnosis not present

## 2021-08-15 DIAGNOSIS — G20 Parkinson's disease: Secondary | ICD-10-CM | POA: Diagnosis not present

## 2021-08-16 DIAGNOSIS — I480 Paroxysmal atrial fibrillation: Secondary | ICD-10-CM | POA: Diagnosis not present

## 2021-08-16 DIAGNOSIS — U071 COVID-19: Secondary | ICD-10-CM | POA: Diagnosis not present

## 2021-08-16 DIAGNOSIS — G4452 New daily persistent headache (NDPH): Secondary | ICD-10-CM | POA: Diagnosis not present

## 2021-08-16 DIAGNOSIS — I4819 Other persistent atrial fibrillation: Secondary | ICD-10-CM | POA: Diagnosis not present

## 2021-08-16 DIAGNOSIS — G20 Parkinson's disease: Secondary | ICD-10-CM | POA: Diagnosis not present

## 2021-08-16 DIAGNOSIS — I272 Pulmonary hypertension, unspecified: Secondary | ICD-10-CM | POA: Diagnosis not present

## 2021-08-16 DIAGNOSIS — N179 Acute kidney failure, unspecified: Secondary | ICD-10-CM | POA: Diagnosis not present

## 2021-08-16 DIAGNOSIS — E11649 Type 2 diabetes mellitus with hypoglycemia without coma: Secondary | ICD-10-CM | POA: Diagnosis not present

## 2021-08-16 DIAGNOSIS — G9341 Metabolic encephalopathy: Secondary | ICD-10-CM | POA: Diagnosis not present

## 2021-08-16 DIAGNOSIS — R2243 Localized swelling, mass and lump, lower limb, bilateral: Secondary | ICD-10-CM | POA: Diagnosis not present

## 2021-08-19 DIAGNOSIS — I272 Pulmonary hypertension, unspecified: Secondary | ICD-10-CM | POA: Diagnosis not present

## 2021-08-19 DIAGNOSIS — I480 Paroxysmal atrial fibrillation: Secondary | ICD-10-CM | POA: Diagnosis not present

## 2021-08-19 DIAGNOSIS — G9341 Metabolic encephalopathy: Secondary | ICD-10-CM | POA: Diagnosis not present

## 2021-08-19 DIAGNOSIS — N179 Acute kidney failure, unspecified: Secondary | ICD-10-CM | POA: Diagnosis not present

## 2021-08-19 DIAGNOSIS — E11649 Type 2 diabetes mellitus with hypoglycemia without coma: Secondary | ICD-10-CM | POA: Diagnosis not present

## 2021-08-19 DIAGNOSIS — G20 Parkinson's disease: Secondary | ICD-10-CM | POA: Diagnosis not present

## 2021-08-19 DIAGNOSIS — G4452 New daily persistent headache (NDPH): Secondary | ICD-10-CM | POA: Diagnosis not present

## 2021-08-19 DIAGNOSIS — T796XXA Traumatic ischemia of muscle, initial encounter: Secondary | ICD-10-CM | POA: Diagnosis not present

## 2021-08-19 DIAGNOSIS — U071 COVID-19: Secondary | ICD-10-CM | POA: Diagnosis not present

## 2021-08-19 DIAGNOSIS — E119 Type 2 diabetes mellitus without complications: Secondary | ICD-10-CM | POA: Diagnosis not present

## 2021-08-19 DIAGNOSIS — R2243 Localized swelling, mass and lump, lower limb, bilateral: Secondary | ICD-10-CM | POA: Diagnosis not present

## 2021-08-19 DIAGNOSIS — I4819 Other persistent atrial fibrillation: Secondary | ICD-10-CM | POA: Diagnosis not present

## 2021-08-20 DIAGNOSIS — E11649 Type 2 diabetes mellitus with hypoglycemia without coma: Secondary | ICD-10-CM | POA: Diagnosis not present

## 2021-08-20 DIAGNOSIS — I4819 Other persistent atrial fibrillation: Secondary | ICD-10-CM | POA: Diagnosis not present

## 2021-08-20 DIAGNOSIS — G20 Parkinson's disease: Secondary | ICD-10-CM | POA: Diagnosis not present

## 2021-08-20 DIAGNOSIS — G9341 Metabolic encephalopathy: Secondary | ICD-10-CM | POA: Diagnosis not present

## 2021-08-20 DIAGNOSIS — G4452 New daily persistent headache (NDPH): Secondary | ICD-10-CM | POA: Diagnosis not present

## 2021-08-20 DIAGNOSIS — I272 Pulmonary hypertension, unspecified: Secondary | ICD-10-CM | POA: Diagnosis not present

## 2021-08-20 DIAGNOSIS — U071 COVID-19: Secondary | ICD-10-CM | POA: Diagnosis not present

## 2021-08-20 DIAGNOSIS — I480 Paroxysmal atrial fibrillation: Secondary | ICD-10-CM | POA: Diagnosis not present

## 2021-08-20 DIAGNOSIS — N179 Acute kidney failure, unspecified: Secondary | ICD-10-CM | POA: Diagnosis not present

## 2021-08-20 DIAGNOSIS — R2243 Localized swelling, mass and lump, lower limb, bilateral: Secondary | ICD-10-CM | POA: Diagnosis not present

## 2021-08-21 DIAGNOSIS — I272 Pulmonary hypertension, unspecified: Secondary | ICD-10-CM | POA: Diagnosis not present

## 2021-08-21 DIAGNOSIS — G4452 New daily persistent headache (NDPH): Secondary | ICD-10-CM | POA: Diagnosis not present

## 2021-08-21 DIAGNOSIS — E11649 Type 2 diabetes mellitus with hypoglycemia without coma: Secondary | ICD-10-CM | POA: Diagnosis not present

## 2021-08-21 DIAGNOSIS — N179 Acute kidney failure, unspecified: Secondary | ICD-10-CM | POA: Diagnosis not present

## 2021-08-21 DIAGNOSIS — I4819 Other persistent atrial fibrillation: Secondary | ICD-10-CM | POA: Diagnosis not present

## 2021-08-21 DIAGNOSIS — G9341 Metabolic encephalopathy: Secondary | ICD-10-CM | POA: Diagnosis not present

## 2021-08-21 DIAGNOSIS — I480 Paroxysmal atrial fibrillation: Secondary | ICD-10-CM | POA: Diagnosis not present

## 2021-08-21 DIAGNOSIS — U071 COVID-19: Secondary | ICD-10-CM | POA: Diagnosis not present

## 2021-08-21 DIAGNOSIS — R2243 Localized swelling, mass and lump, lower limb, bilateral: Secondary | ICD-10-CM | POA: Diagnosis not present

## 2021-08-21 DIAGNOSIS — G20 Parkinson's disease: Secondary | ICD-10-CM | POA: Diagnosis not present

## 2021-08-22 DIAGNOSIS — I480 Paroxysmal atrial fibrillation: Secondary | ICD-10-CM | POA: Diagnosis not present

## 2021-08-22 DIAGNOSIS — G9341 Metabolic encephalopathy: Secondary | ICD-10-CM | POA: Diagnosis not present

## 2021-08-22 DIAGNOSIS — G4452 New daily persistent headache (NDPH): Secondary | ICD-10-CM | POA: Diagnosis not present

## 2021-08-22 DIAGNOSIS — I4819 Other persistent atrial fibrillation: Secondary | ICD-10-CM | POA: Diagnosis not present

## 2021-08-22 DIAGNOSIS — G20 Parkinson's disease: Secondary | ICD-10-CM | POA: Diagnosis not present

## 2021-08-22 DIAGNOSIS — U071 COVID-19: Secondary | ICD-10-CM | POA: Diagnosis not present

## 2021-08-22 DIAGNOSIS — N179 Acute kidney failure, unspecified: Secondary | ICD-10-CM | POA: Diagnosis not present

## 2021-08-22 DIAGNOSIS — E11649 Type 2 diabetes mellitus with hypoglycemia without coma: Secondary | ICD-10-CM | POA: Diagnosis not present

## 2021-08-22 DIAGNOSIS — I272 Pulmonary hypertension, unspecified: Secondary | ICD-10-CM | POA: Diagnosis not present

## 2021-08-22 DIAGNOSIS — R2243 Localized swelling, mass and lump, lower limb, bilateral: Secondary | ICD-10-CM | POA: Diagnosis not present

## 2021-09-09 DIAGNOSIS — Z794 Long term (current) use of insulin: Secondary | ICD-10-CM | POA: Diagnosis not present

## 2021-09-09 DIAGNOSIS — I480 Paroxysmal atrial fibrillation: Secondary | ICD-10-CM | POA: Diagnosis not present

## 2021-09-09 DIAGNOSIS — E1142 Type 2 diabetes mellitus with diabetic polyneuropathy: Secondary | ICD-10-CM | POA: Diagnosis not present

## 2021-09-09 DIAGNOSIS — G20 Parkinson's disease: Secondary | ICD-10-CM | POA: Diagnosis not present

## 2021-09-16 DIAGNOSIS — G20 Parkinson's disease: Secondary | ICD-10-CM | POA: Diagnosis not present

## 2021-09-16 DIAGNOSIS — I10 Essential (primary) hypertension: Secondary | ICD-10-CM | POA: Diagnosis not present

## 2021-09-16 DIAGNOSIS — E1142 Type 2 diabetes mellitus with diabetic polyneuropathy: Secondary | ICD-10-CM | POA: Diagnosis not present

## 2021-09-16 DIAGNOSIS — I480 Paroxysmal atrial fibrillation: Secondary | ICD-10-CM | POA: Diagnosis not present

## 2021-09-18 DIAGNOSIS — L603 Nail dystrophy: Secondary | ICD-10-CM | POA: Diagnosis not present

## 2021-09-18 DIAGNOSIS — E1159 Type 2 diabetes mellitus with other circulatory complications: Secondary | ICD-10-CM | POA: Diagnosis not present

## 2021-09-29 DIAGNOSIS — U071 COVID-19: Secondary | ICD-10-CM | POA: Diagnosis not present

## 2021-09-30 DIAGNOSIS — I10 Essential (primary) hypertension: Secondary | ICD-10-CM | POA: Diagnosis not present

## 2021-09-30 DIAGNOSIS — I480 Paroxysmal atrial fibrillation: Secondary | ICD-10-CM | POA: Diagnosis not present

## 2021-09-30 DIAGNOSIS — U071 COVID-19: Secondary | ICD-10-CM | POA: Diagnosis not present

## 2021-09-30 DIAGNOSIS — E1142 Type 2 diabetes mellitus with diabetic polyneuropathy: Secondary | ICD-10-CM | POA: Diagnosis not present

## 2021-10-08 DIAGNOSIS — U071 COVID-19: Secondary | ICD-10-CM | POA: Diagnosis not present

## 2021-10-10 DIAGNOSIS — R278 Other lack of coordination: Secondary | ICD-10-CM | POA: Diagnosis not present

## 2021-10-10 DIAGNOSIS — I480 Paroxysmal atrial fibrillation: Secondary | ICD-10-CM | POA: Diagnosis not present

## 2021-10-10 DIAGNOSIS — U071 COVID-19: Secondary | ICD-10-CM | POA: Diagnosis not present

## 2021-10-10 DIAGNOSIS — M6281 Muscle weakness (generalized): Secondary | ICD-10-CM | POA: Diagnosis not present

## 2021-10-10 DIAGNOSIS — E11649 Type 2 diabetes mellitus with hypoglycemia without coma: Secondary | ICD-10-CM | POA: Diagnosis not present

## 2021-10-10 DIAGNOSIS — G9341 Metabolic encephalopathy: Secondary | ICD-10-CM | POA: Diagnosis not present

## 2021-10-10 DIAGNOSIS — N179 Acute kidney failure, unspecified: Secondary | ICD-10-CM | POA: Diagnosis not present

## 2021-10-11 DIAGNOSIS — E11649 Type 2 diabetes mellitus with hypoglycemia without coma: Secondary | ICD-10-CM | POA: Diagnosis not present

## 2021-10-11 DIAGNOSIS — I10 Essential (primary) hypertension: Secondary | ICD-10-CM | POA: Diagnosis not present

## 2021-10-11 DIAGNOSIS — I480 Paroxysmal atrial fibrillation: Secondary | ICD-10-CM | POA: Diagnosis not present

## 2021-10-11 DIAGNOSIS — G9341 Metabolic encephalopathy: Secondary | ICD-10-CM | POA: Diagnosis not present

## 2021-10-11 DIAGNOSIS — R278 Other lack of coordination: Secondary | ICD-10-CM | POA: Diagnosis not present

## 2021-10-11 DIAGNOSIS — U071 COVID-19: Secondary | ICD-10-CM | POA: Diagnosis not present

## 2021-10-11 DIAGNOSIS — E1142 Type 2 diabetes mellitus with diabetic polyneuropathy: Secondary | ICD-10-CM | POA: Diagnosis not present

## 2021-10-11 DIAGNOSIS — M6281 Muscle weakness (generalized): Secondary | ICD-10-CM | POA: Diagnosis not present

## 2021-10-11 DIAGNOSIS — G20 Parkinson's disease: Secondary | ICD-10-CM | POA: Diagnosis not present

## 2021-10-11 DIAGNOSIS — N179 Acute kidney failure, unspecified: Secondary | ICD-10-CM | POA: Diagnosis not present

## 2021-10-14 DIAGNOSIS — D508 Other iron deficiency anemias: Secondary | ICD-10-CM | POA: Diagnosis not present

## 2021-10-14 DIAGNOSIS — E11649 Type 2 diabetes mellitus with hypoglycemia without coma: Secondary | ICD-10-CM | POA: Diagnosis not present

## 2021-10-14 DIAGNOSIS — E119 Type 2 diabetes mellitus without complications: Secondary | ICD-10-CM | POA: Diagnosis not present

## 2021-10-14 DIAGNOSIS — J209 Acute bronchitis, unspecified: Secondary | ICD-10-CM | POA: Diagnosis not present

## 2021-10-14 DIAGNOSIS — I480 Paroxysmal atrial fibrillation: Secondary | ICD-10-CM | POA: Diagnosis not present

## 2021-10-14 DIAGNOSIS — G20 Parkinson's disease: Secondary | ICD-10-CM | POA: Diagnosis not present

## 2021-10-14 DIAGNOSIS — R059 Cough, unspecified: Secondary | ICD-10-CM | POA: Diagnosis not present

## 2021-10-14 DIAGNOSIS — N179 Acute kidney failure, unspecified: Secondary | ICD-10-CM | POA: Diagnosis not present

## 2021-10-14 DIAGNOSIS — G9341 Metabolic encephalopathy: Secondary | ICD-10-CM | POA: Diagnosis not present

## 2021-10-14 DIAGNOSIS — U071 COVID-19: Secondary | ICD-10-CM | POA: Diagnosis not present

## 2021-10-15 DIAGNOSIS — G9341 Metabolic encephalopathy: Secondary | ICD-10-CM | POA: Diagnosis not present

## 2021-10-15 DIAGNOSIS — U071 COVID-19: Secondary | ICD-10-CM | POA: Diagnosis not present

## 2021-10-15 DIAGNOSIS — N179 Acute kidney failure, unspecified: Secondary | ICD-10-CM | POA: Diagnosis not present

## 2021-10-15 DIAGNOSIS — G20 Parkinson's disease: Secondary | ICD-10-CM | POA: Diagnosis not present

## 2021-10-15 DIAGNOSIS — E11649 Type 2 diabetes mellitus with hypoglycemia without coma: Secondary | ICD-10-CM | POA: Diagnosis not present

## 2021-10-15 DIAGNOSIS — I480 Paroxysmal atrial fibrillation: Secondary | ICD-10-CM | POA: Diagnosis not present

## 2021-10-16 DIAGNOSIS — I480 Paroxysmal atrial fibrillation: Secondary | ICD-10-CM | POA: Diagnosis not present

## 2021-10-16 DIAGNOSIS — N179 Acute kidney failure, unspecified: Secondary | ICD-10-CM | POA: Diagnosis not present

## 2021-10-16 DIAGNOSIS — G20 Parkinson's disease: Secondary | ICD-10-CM | POA: Diagnosis not present

## 2021-10-16 DIAGNOSIS — U071 COVID-19: Secondary | ICD-10-CM | POA: Diagnosis not present

## 2021-10-16 DIAGNOSIS — E11649 Type 2 diabetes mellitus with hypoglycemia without coma: Secondary | ICD-10-CM | POA: Diagnosis not present

## 2021-10-16 DIAGNOSIS — G9341 Metabolic encephalopathy: Secondary | ICD-10-CM | POA: Diagnosis not present

## 2021-10-17 DIAGNOSIS — G9341 Metabolic encephalopathy: Secondary | ICD-10-CM | POA: Diagnosis not present

## 2021-10-17 DIAGNOSIS — E11649 Type 2 diabetes mellitus with hypoglycemia without coma: Secondary | ICD-10-CM | POA: Diagnosis not present

## 2021-10-17 DIAGNOSIS — U071 COVID-19: Secondary | ICD-10-CM | POA: Diagnosis not present

## 2021-10-17 DIAGNOSIS — I480 Paroxysmal atrial fibrillation: Secondary | ICD-10-CM | POA: Diagnosis not present

## 2021-10-17 DIAGNOSIS — G20 Parkinson's disease: Secondary | ICD-10-CM | POA: Diagnosis not present

## 2021-10-17 DIAGNOSIS — N179 Acute kidney failure, unspecified: Secondary | ICD-10-CM | POA: Diagnosis not present

## 2021-10-18 DIAGNOSIS — I480 Paroxysmal atrial fibrillation: Secondary | ICD-10-CM | POA: Diagnosis not present

## 2021-10-18 DIAGNOSIS — U071 COVID-19: Secondary | ICD-10-CM | POA: Diagnosis not present

## 2021-10-18 DIAGNOSIS — N179 Acute kidney failure, unspecified: Secondary | ICD-10-CM | POA: Diagnosis not present

## 2021-10-18 DIAGNOSIS — E11649 Type 2 diabetes mellitus with hypoglycemia without coma: Secondary | ICD-10-CM | POA: Diagnosis not present

## 2021-10-18 DIAGNOSIS — G20 Parkinson's disease: Secondary | ICD-10-CM | POA: Diagnosis not present

## 2021-10-18 DIAGNOSIS — G9341 Metabolic encephalopathy: Secondary | ICD-10-CM | POA: Diagnosis not present

## 2021-10-21 DIAGNOSIS — I480 Paroxysmal atrial fibrillation: Secondary | ICD-10-CM | POA: Diagnosis not present

## 2021-10-21 DIAGNOSIS — E11649 Type 2 diabetes mellitus with hypoglycemia without coma: Secondary | ICD-10-CM | POA: Diagnosis not present

## 2021-10-21 DIAGNOSIS — G9341 Metabolic encephalopathy: Secondary | ICD-10-CM | POA: Diagnosis not present

## 2021-10-21 DIAGNOSIS — N179 Acute kidney failure, unspecified: Secondary | ICD-10-CM | POA: Diagnosis not present

## 2021-10-21 DIAGNOSIS — G20 Parkinson's disease: Secondary | ICD-10-CM | POA: Diagnosis not present

## 2021-10-21 DIAGNOSIS — U071 COVID-19: Secondary | ICD-10-CM | POA: Diagnosis not present

## 2021-10-22 DIAGNOSIS — G20 Parkinson's disease: Secondary | ICD-10-CM | POA: Diagnosis not present

## 2021-10-22 DIAGNOSIS — N179 Acute kidney failure, unspecified: Secondary | ICD-10-CM | POA: Diagnosis not present

## 2021-10-22 DIAGNOSIS — E11649 Type 2 diabetes mellitus with hypoglycemia without coma: Secondary | ICD-10-CM | POA: Diagnosis not present

## 2021-10-22 DIAGNOSIS — G9341 Metabolic encephalopathy: Secondary | ICD-10-CM | POA: Diagnosis not present

## 2021-10-22 DIAGNOSIS — I480 Paroxysmal atrial fibrillation: Secondary | ICD-10-CM | POA: Diagnosis not present

## 2021-10-22 DIAGNOSIS — U071 COVID-19: Secondary | ICD-10-CM | POA: Diagnosis not present

## 2021-10-23 DIAGNOSIS — I480 Paroxysmal atrial fibrillation: Secondary | ICD-10-CM | POA: Diagnosis not present

## 2021-10-23 DIAGNOSIS — N179 Acute kidney failure, unspecified: Secondary | ICD-10-CM | POA: Diagnosis not present

## 2021-10-23 DIAGNOSIS — G9341 Metabolic encephalopathy: Secondary | ICD-10-CM | POA: Diagnosis not present

## 2021-10-23 DIAGNOSIS — E11649 Type 2 diabetes mellitus with hypoglycemia without coma: Secondary | ICD-10-CM | POA: Diagnosis not present

## 2021-10-23 DIAGNOSIS — G20 Parkinson's disease: Secondary | ICD-10-CM | POA: Diagnosis not present

## 2021-10-23 DIAGNOSIS — U071 COVID-19: Secondary | ICD-10-CM | POA: Diagnosis not present

## 2021-11-08 DIAGNOSIS — D508 Other iron deficiency anemias: Secondary | ICD-10-CM | POA: Diagnosis not present

## 2021-11-08 DIAGNOSIS — I10 Essential (primary) hypertension: Secondary | ICD-10-CM | POA: Diagnosis not present

## 2021-11-08 DIAGNOSIS — E119 Type 2 diabetes mellitus without complications: Secondary | ICD-10-CM | POA: Diagnosis not present

## 2021-11-08 DIAGNOSIS — G20 Parkinson's disease: Secondary | ICD-10-CM | POA: Diagnosis not present

## 2021-11-11 DIAGNOSIS — N179 Acute kidney failure, unspecified: Secondary | ICD-10-CM | POA: Diagnosis not present

## 2021-11-11 DIAGNOSIS — G20 Parkinson's disease: Secondary | ICD-10-CM | POA: Diagnosis not present

## 2021-11-11 DIAGNOSIS — G9341 Metabolic encephalopathy: Secondary | ICD-10-CM | POA: Diagnosis not present

## 2021-11-11 DIAGNOSIS — E119 Type 2 diabetes mellitus without complications: Secondary | ICD-10-CM | POA: Diagnosis not present

## 2021-11-11 DIAGNOSIS — U071 COVID-19: Secondary | ICD-10-CM | POA: Diagnosis not present

## 2021-11-11 DIAGNOSIS — I480 Paroxysmal atrial fibrillation: Secondary | ICD-10-CM | POA: Diagnosis not present

## 2021-11-11 DIAGNOSIS — E11649 Type 2 diabetes mellitus with hypoglycemia without coma: Secondary | ICD-10-CM | POA: Diagnosis not present

## 2021-11-12 DIAGNOSIS — G20 Parkinson's disease: Secondary | ICD-10-CM | POA: Diagnosis not present

## 2021-11-12 DIAGNOSIS — G9341 Metabolic encephalopathy: Secondary | ICD-10-CM | POA: Diagnosis not present

## 2021-11-12 DIAGNOSIS — N179 Acute kidney failure, unspecified: Secondary | ICD-10-CM | POA: Diagnosis not present

## 2021-11-12 DIAGNOSIS — E11649 Type 2 diabetes mellitus with hypoglycemia without coma: Secondary | ICD-10-CM | POA: Diagnosis not present

## 2021-11-12 DIAGNOSIS — U071 COVID-19: Secondary | ICD-10-CM | POA: Diagnosis not present

## 2021-11-12 DIAGNOSIS — I480 Paroxysmal atrial fibrillation: Secondary | ICD-10-CM | POA: Diagnosis not present

## 2021-11-13 DIAGNOSIS — M6281 Muscle weakness (generalized): Secondary | ICD-10-CM | POA: Diagnosis not present

## 2021-11-13 DIAGNOSIS — R2681 Unsteadiness on feet: Secondary | ICD-10-CM | POA: Diagnosis not present

## 2021-11-13 DIAGNOSIS — G20 Parkinson's disease: Secondary | ICD-10-CM | POA: Diagnosis not present

## 2021-11-13 DIAGNOSIS — I480 Paroxysmal atrial fibrillation: Secondary | ICD-10-CM | POA: Diagnosis not present

## 2021-11-13 DIAGNOSIS — N179 Acute kidney failure, unspecified: Secondary | ICD-10-CM | POA: Diagnosis not present

## 2021-11-13 DIAGNOSIS — E11649 Type 2 diabetes mellitus with hypoglycemia without coma: Secondary | ICD-10-CM | POA: Diagnosis not present

## 2021-11-13 DIAGNOSIS — G9341 Metabolic encephalopathy: Secondary | ICD-10-CM | POA: Diagnosis not present

## 2021-11-14 DIAGNOSIS — N179 Acute kidney failure, unspecified: Secondary | ICD-10-CM | POA: Diagnosis not present

## 2021-11-14 DIAGNOSIS — R2681 Unsteadiness on feet: Secondary | ICD-10-CM | POA: Diagnosis not present

## 2021-11-14 DIAGNOSIS — G9341 Metabolic encephalopathy: Secondary | ICD-10-CM | POA: Diagnosis not present

## 2021-11-14 DIAGNOSIS — I480 Paroxysmal atrial fibrillation: Secondary | ICD-10-CM | POA: Diagnosis not present

## 2021-11-14 DIAGNOSIS — G20 Parkinson's disease: Secondary | ICD-10-CM | POA: Diagnosis not present

## 2021-11-14 DIAGNOSIS — E11649 Type 2 diabetes mellitus with hypoglycemia without coma: Secondary | ICD-10-CM | POA: Diagnosis not present

## 2021-11-14 DIAGNOSIS — M6281 Muscle weakness (generalized): Secondary | ICD-10-CM | POA: Diagnosis not present

## 2021-11-15 DIAGNOSIS — M6281 Muscle weakness (generalized): Secondary | ICD-10-CM | POA: Diagnosis not present

## 2021-11-15 DIAGNOSIS — G9341 Metabolic encephalopathy: Secondary | ICD-10-CM | POA: Diagnosis not present

## 2021-11-15 DIAGNOSIS — E11649 Type 2 diabetes mellitus with hypoglycemia without coma: Secondary | ICD-10-CM | POA: Diagnosis not present

## 2021-11-15 DIAGNOSIS — R2681 Unsteadiness on feet: Secondary | ICD-10-CM | POA: Diagnosis not present

## 2021-11-15 DIAGNOSIS — G20 Parkinson's disease: Secondary | ICD-10-CM | POA: Diagnosis not present

## 2021-11-15 DIAGNOSIS — N179 Acute kidney failure, unspecified: Secondary | ICD-10-CM | POA: Diagnosis not present

## 2021-11-15 DIAGNOSIS — I480 Paroxysmal atrial fibrillation: Secondary | ICD-10-CM | POA: Diagnosis not present

## 2021-11-18 DIAGNOSIS — I480 Paroxysmal atrial fibrillation: Secondary | ICD-10-CM | POA: Diagnosis not present

## 2021-11-18 DIAGNOSIS — N179 Acute kidney failure, unspecified: Secondary | ICD-10-CM | POA: Diagnosis not present

## 2021-11-18 DIAGNOSIS — E11649 Type 2 diabetes mellitus with hypoglycemia without coma: Secondary | ICD-10-CM | POA: Diagnosis not present

## 2021-11-18 DIAGNOSIS — G9341 Metabolic encephalopathy: Secondary | ICD-10-CM | POA: Diagnosis not present

## 2021-11-18 DIAGNOSIS — M6281 Muscle weakness (generalized): Secondary | ICD-10-CM | POA: Diagnosis not present

## 2021-11-18 DIAGNOSIS — R2681 Unsteadiness on feet: Secondary | ICD-10-CM | POA: Diagnosis not present

## 2021-11-18 DIAGNOSIS — G20 Parkinson's disease: Secondary | ICD-10-CM | POA: Diagnosis not present

## 2021-11-19 DIAGNOSIS — M6281 Muscle weakness (generalized): Secondary | ICD-10-CM | POA: Diagnosis not present

## 2021-11-19 DIAGNOSIS — R2681 Unsteadiness on feet: Secondary | ICD-10-CM | POA: Diagnosis not present

## 2021-11-19 DIAGNOSIS — G9341 Metabolic encephalopathy: Secondary | ICD-10-CM | POA: Diagnosis not present

## 2021-11-19 DIAGNOSIS — G20 Parkinson's disease: Secondary | ICD-10-CM | POA: Diagnosis not present

## 2021-11-19 DIAGNOSIS — E11649 Type 2 diabetes mellitus with hypoglycemia without coma: Secondary | ICD-10-CM | POA: Diagnosis not present

## 2021-11-19 DIAGNOSIS — I480 Paroxysmal atrial fibrillation: Secondary | ICD-10-CM | POA: Diagnosis not present

## 2021-11-19 DIAGNOSIS — N179 Acute kidney failure, unspecified: Secondary | ICD-10-CM | POA: Diagnosis not present

## 2021-12-06 DIAGNOSIS — I480 Paroxysmal atrial fibrillation: Secondary | ICD-10-CM | POA: Diagnosis not present

## 2021-12-06 DIAGNOSIS — G20 Parkinson's disease: Secondary | ICD-10-CM | POA: Diagnosis not present

## 2021-12-06 DIAGNOSIS — I10 Essential (primary) hypertension: Secondary | ICD-10-CM | POA: Diagnosis not present

## 2021-12-06 DIAGNOSIS — E1142 Type 2 diabetes mellitus with diabetic polyneuropathy: Secondary | ICD-10-CM | POA: Diagnosis not present

## 2021-12-09 DIAGNOSIS — G20 Parkinson's disease: Secondary | ICD-10-CM | POA: Diagnosis not present

## 2021-12-09 DIAGNOSIS — I10 Essential (primary) hypertension: Secondary | ICD-10-CM | POA: Diagnosis not present

## 2021-12-09 DIAGNOSIS — I480 Paroxysmal atrial fibrillation: Secondary | ICD-10-CM | POA: Diagnosis not present

## 2021-12-09 DIAGNOSIS — E119 Type 2 diabetes mellitus without complications: Secondary | ICD-10-CM | POA: Diagnosis not present

## 2021-12-12 DIAGNOSIS — N390 Urinary tract infection, site not specified: Secondary | ICD-10-CM | POA: Diagnosis not present

## 2021-12-18 DIAGNOSIS — B351 Tinea unguium: Secondary | ICD-10-CM | POA: Diagnosis not present

## 2021-12-18 DIAGNOSIS — E1159 Type 2 diabetes mellitus with other circulatory complications: Secondary | ICD-10-CM | POA: Diagnosis not present

## 2022-01-01 DIAGNOSIS — F419 Anxiety disorder, unspecified: Secondary | ICD-10-CM | POA: Diagnosis not present

## 2022-01-03 DIAGNOSIS — I480 Paroxysmal atrial fibrillation: Secondary | ICD-10-CM | POA: Diagnosis not present

## 2022-01-03 DIAGNOSIS — D508 Other iron deficiency anemias: Secondary | ICD-10-CM | POA: Diagnosis not present

## 2022-01-03 DIAGNOSIS — G20 Parkinson's disease: Secondary | ICD-10-CM | POA: Diagnosis not present

## 2022-01-03 DIAGNOSIS — M1A9XX0 Chronic gout, unspecified, without tophus (tophi): Secondary | ICD-10-CM | POA: Diagnosis not present

## 2022-01-07 DIAGNOSIS — I10 Essential (primary) hypertension: Secondary | ICD-10-CM | POA: Diagnosis not present

## 2022-01-07 DIAGNOSIS — G20 Parkinson's disease: Secondary | ICD-10-CM | POA: Diagnosis not present

## 2022-01-07 DIAGNOSIS — I480 Paroxysmal atrial fibrillation: Secondary | ICD-10-CM | POA: Diagnosis not present

## 2022-01-07 DIAGNOSIS — E119 Type 2 diabetes mellitus without complications: Secondary | ICD-10-CM | POA: Diagnosis not present

## 2022-01-31 DIAGNOSIS — G20 Parkinson's disease: Secondary | ICD-10-CM | POA: Diagnosis not present

## 2022-01-31 DIAGNOSIS — I10 Essential (primary) hypertension: Secondary | ICD-10-CM | POA: Diagnosis not present

## 2022-01-31 DIAGNOSIS — E119 Type 2 diabetes mellitus without complications: Secondary | ICD-10-CM | POA: Diagnosis not present

## 2022-01-31 DIAGNOSIS — I480 Paroxysmal atrial fibrillation: Secondary | ICD-10-CM | POA: Diagnosis not present

## 2022-02-11 DIAGNOSIS — E119 Type 2 diabetes mellitus without complications: Secondary | ICD-10-CM | POA: Diagnosis not present

## 2022-02-11 DIAGNOSIS — E039 Hypothyroidism, unspecified: Secondary | ICD-10-CM | POA: Diagnosis not present

## 2022-02-18 DIAGNOSIS — G20 Parkinson's disease: Secondary | ICD-10-CM | POA: Diagnosis not present

## 2022-02-18 DIAGNOSIS — I10 Essential (primary) hypertension: Secondary | ICD-10-CM | POA: Diagnosis not present

## 2022-02-18 DIAGNOSIS — E119 Type 2 diabetes mellitus without complications: Secondary | ICD-10-CM | POA: Diagnosis not present

## 2022-02-18 DIAGNOSIS — I480 Paroxysmal atrial fibrillation: Secondary | ICD-10-CM | POA: Diagnosis not present

## 2022-03-03 DIAGNOSIS — M1A9XX0 Chronic gout, unspecified, without tophus (tophi): Secondary | ICD-10-CM | POA: Diagnosis not present

## 2022-03-03 DIAGNOSIS — I480 Paroxysmal atrial fibrillation: Secondary | ICD-10-CM | POA: Diagnosis not present

## 2022-03-03 DIAGNOSIS — D508 Other iron deficiency anemias: Secondary | ICD-10-CM | POA: Diagnosis not present

## 2022-03-03 DIAGNOSIS — I10 Essential (primary) hypertension: Secondary | ICD-10-CM | POA: Diagnosis not present

## 2022-03-18 DIAGNOSIS — G20 Parkinson's disease: Secondary | ICD-10-CM | POA: Diagnosis not present

## 2022-03-18 DIAGNOSIS — E119 Type 2 diabetes mellitus without complications: Secondary | ICD-10-CM | POA: Diagnosis not present

## 2022-03-18 DIAGNOSIS — I480 Paroxysmal atrial fibrillation: Secondary | ICD-10-CM | POA: Diagnosis not present

## 2022-03-18 DIAGNOSIS — I10 Essential (primary) hypertension: Secondary | ICD-10-CM | POA: Diagnosis not present

## 2022-03-28 DIAGNOSIS — D508 Other iron deficiency anemias: Secondary | ICD-10-CM | POA: Diagnosis not present

## 2022-03-28 DIAGNOSIS — E119 Type 2 diabetes mellitus without complications: Secondary | ICD-10-CM | POA: Diagnosis not present

## 2022-03-28 DIAGNOSIS — I480 Paroxysmal atrial fibrillation: Secondary | ICD-10-CM | POA: Diagnosis not present

## 2022-03-28 DIAGNOSIS — I10 Essential (primary) hypertension: Secondary | ICD-10-CM | POA: Diagnosis not present

## 2022-04-22 DIAGNOSIS — B351 Tinea unguium: Secondary | ICD-10-CM | POA: Diagnosis not present

## 2022-04-22 DIAGNOSIS — I10 Essential (primary) hypertension: Secondary | ICD-10-CM | POA: Diagnosis not present

## 2022-04-22 DIAGNOSIS — E119 Type 2 diabetes mellitus without complications: Secondary | ICD-10-CM | POA: Diagnosis not present

## 2022-04-22 DIAGNOSIS — G20 Parkinson's disease: Secondary | ICD-10-CM | POA: Diagnosis not present

## 2022-04-22 DIAGNOSIS — I480 Paroxysmal atrial fibrillation: Secondary | ICD-10-CM | POA: Diagnosis not present

## 2022-04-22 DIAGNOSIS — E1159 Type 2 diabetes mellitus with other circulatory complications: Secondary | ICD-10-CM | POA: Diagnosis not present

## 2022-04-28 DIAGNOSIS — H109 Unspecified conjunctivitis: Secondary | ICD-10-CM | POA: Diagnosis not present

## 2022-04-30 DIAGNOSIS — G20 Parkinson's disease: Secondary | ICD-10-CM | POA: Diagnosis not present

## 2022-04-30 DIAGNOSIS — D508 Other iron deficiency anemias: Secondary | ICD-10-CM | POA: Diagnosis not present

## 2022-04-30 DIAGNOSIS — E1142 Type 2 diabetes mellitus with diabetic polyneuropathy: Secondary | ICD-10-CM | POA: Diagnosis not present

## 2022-04-30 DIAGNOSIS — H109 Unspecified conjunctivitis: Secondary | ICD-10-CM | POA: Diagnosis not present

## 2022-05-07 DIAGNOSIS — H109 Unspecified conjunctivitis: Secondary | ICD-10-CM | POA: Diagnosis not present

## 2022-05-12 DIAGNOSIS — G20 Parkinson's disease: Secondary | ICD-10-CM | POA: Diagnosis not present

## 2022-05-12 DIAGNOSIS — I480 Paroxysmal atrial fibrillation: Secondary | ICD-10-CM | POA: Diagnosis not present

## 2022-05-12 DIAGNOSIS — M6281 Muscle weakness (generalized): Secondary | ICD-10-CM | POA: Diagnosis not present

## 2022-05-12 DIAGNOSIS — E11649 Type 2 diabetes mellitus with hypoglycemia without coma: Secondary | ICD-10-CM | POA: Diagnosis not present

## 2022-05-12 DIAGNOSIS — R2681 Unsteadiness on feet: Secondary | ICD-10-CM | POA: Diagnosis not present

## 2022-05-12 DIAGNOSIS — N179 Acute kidney failure, unspecified: Secondary | ICD-10-CM | POA: Diagnosis not present

## 2022-05-12 DIAGNOSIS — G9341 Metabolic encephalopathy: Secondary | ICD-10-CM | POA: Diagnosis not present

## 2022-05-12 DIAGNOSIS — J9601 Acute respiratory failure with hypoxia: Secondary | ICD-10-CM | POA: Diagnosis not present

## 2022-05-12 DIAGNOSIS — D529 Folate deficiency anemia, unspecified: Secondary | ICD-10-CM | POA: Diagnosis not present

## 2022-05-13 DIAGNOSIS — D529 Folate deficiency anemia, unspecified: Secondary | ICD-10-CM | POA: Diagnosis not present

## 2022-05-13 DIAGNOSIS — G20 Parkinson's disease: Secondary | ICD-10-CM | POA: Diagnosis not present

## 2022-05-13 DIAGNOSIS — M6281 Muscle weakness (generalized): Secondary | ICD-10-CM | POA: Diagnosis not present

## 2022-05-13 DIAGNOSIS — E11649 Type 2 diabetes mellitus with hypoglycemia without coma: Secondary | ICD-10-CM | POA: Diagnosis not present

## 2022-05-13 DIAGNOSIS — R2681 Unsteadiness on feet: Secondary | ICD-10-CM | POA: Diagnosis not present

## 2022-05-13 DIAGNOSIS — M810 Age-related osteoporosis without current pathological fracture: Secondary | ICD-10-CM | POA: Diagnosis not present

## 2022-05-13 DIAGNOSIS — I480 Paroxysmal atrial fibrillation: Secondary | ICD-10-CM | POA: Diagnosis not present

## 2022-05-13 DIAGNOSIS — J9601 Acute respiratory failure with hypoxia: Secondary | ICD-10-CM | POA: Diagnosis not present

## 2022-05-14 DIAGNOSIS — D529 Folate deficiency anemia, unspecified: Secondary | ICD-10-CM | POA: Diagnosis not present

## 2022-05-14 DIAGNOSIS — E11649 Type 2 diabetes mellitus with hypoglycemia without coma: Secondary | ICD-10-CM | POA: Diagnosis not present

## 2022-05-14 DIAGNOSIS — M810 Age-related osteoporosis without current pathological fracture: Secondary | ICD-10-CM | POA: Diagnosis not present

## 2022-05-14 DIAGNOSIS — I480 Paroxysmal atrial fibrillation: Secondary | ICD-10-CM | POA: Diagnosis not present

## 2022-05-14 DIAGNOSIS — M6281 Muscle weakness (generalized): Secondary | ICD-10-CM | POA: Diagnosis not present

## 2022-05-14 DIAGNOSIS — G20 Parkinson's disease: Secondary | ICD-10-CM | POA: Diagnosis not present

## 2022-05-14 DIAGNOSIS — J9601 Acute respiratory failure with hypoxia: Secondary | ICD-10-CM | POA: Diagnosis not present

## 2022-05-14 DIAGNOSIS — R2681 Unsteadiness on feet: Secondary | ICD-10-CM | POA: Diagnosis not present

## 2022-05-15 DIAGNOSIS — I480 Paroxysmal atrial fibrillation: Secondary | ICD-10-CM | POA: Diagnosis not present

## 2022-05-15 DIAGNOSIS — M810 Age-related osteoporosis without current pathological fracture: Secondary | ICD-10-CM | POA: Diagnosis not present

## 2022-05-15 DIAGNOSIS — G20 Parkinson's disease: Secondary | ICD-10-CM | POA: Diagnosis not present

## 2022-05-15 DIAGNOSIS — M6281 Muscle weakness (generalized): Secondary | ICD-10-CM | POA: Diagnosis not present

## 2022-05-15 DIAGNOSIS — J9601 Acute respiratory failure with hypoxia: Secondary | ICD-10-CM | POA: Diagnosis not present

## 2022-05-15 DIAGNOSIS — D529 Folate deficiency anemia, unspecified: Secondary | ICD-10-CM | POA: Diagnosis not present

## 2022-05-15 DIAGNOSIS — R2681 Unsteadiness on feet: Secondary | ICD-10-CM | POA: Diagnosis not present

## 2022-05-15 DIAGNOSIS — E11649 Type 2 diabetes mellitus with hypoglycemia without coma: Secondary | ICD-10-CM | POA: Diagnosis not present

## 2022-05-16 DIAGNOSIS — J9601 Acute respiratory failure with hypoxia: Secondary | ICD-10-CM | POA: Diagnosis not present

## 2022-05-16 DIAGNOSIS — M810 Age-related osteoporosis without current pathological fracture: Secondary | ICD-10-CM | POA: Diagnosis not present

## 2022-05-16 DIAGNOSIS — D529 Folate deficiency anemia, unspecified: Secondary | ICD-10-CM | POA: Diagnosis not present

## 2022-05-16 DIAGNOSIS — R2681 Unsteadiness on feet: Secondary | ICD-10-CM | POA: Diagnosis not present

## 2022-05-16 DIAGNOSIS — M6281 Muscle weakness (generalized): Secondary | ICD-10-CM | POA: Diagnosis not present

## 2022-05-16 DIAGNOSIS — I480 Paroxysmal atrial fibrillation: Secondary | ICD-10-CM | POA: Diagnosis not present

## 2022-05-16 DIAGNOSIS — G20 Parkinson's disease: Secondary | ICD-10-CM | POA: Diagnosis not present

## 2022-05-16 DIAGNOSIS — E11649 Type 2 diabetes mellitus with hypoglycemia without coma: Secondary | ICD-10-CM | POA: Diagnosis not present

## 2022-05-20 DIAGNOSIS — E1142 Type 2 diabetes mellitus with diabetic polyneuropathy: Secondary | ICD-10-CM | POA: Diagnosis not present

## 2022-05-20 DIAGNOSIS — G20 Parkinson's disease: Secondary | ICD-10-CM | POA: Diagnosis not present

## 2022-05-20 DIAGNOSIS — D508 Other iron deficiency anemias: Secondary | ICD-10-CM | POA: Diagnosis not present

## 2022-05-20 DIAGNOSIS — H109 Unspecified conjunctivitis: Secondary | ICD-10-CM | POA: Diagnosis not present

## 2022-06-24 DIAGNOSIS — H109 Unspecified conjunctivitis: Secondary | ICD-10-CM | POA: Diagnosis not present

## 2022-06-24 DIAGNOSIS — D508 Other iron deficiency anemias: Secondary | ICD-10-CM | POA: Diagnosis not present

## 2022-06-24 DIAGNOSIS — E1142 Type 2 diabetes mellitus with diabetic polyneuropathy: Secondary | ICD-10-CM | POA: Diagnosis not present

## 2022-06-24 DIAGNOSIS — G20 Parkinson's disease: Secondary | ICD-10-CM | POA: Diagnosis not present

## 2022-07-15 DIAGNOSIS — R1310 Dysphagia, unspecified: Secondary | ICD-10-CM | POA: Diagnosis not present

## 2022-07-15 DIAGNOSIS — G20C Parkinsonism, unspecified: Secondary | ICD-10-CM | POA: Diagnosis not present

## 2022-07-15 DIAGNOSIS — I480 Paroxysmal atrial fibrillation: Secondary | ICD-10-CM | POA: Diagnosis not present

## 2022-07-15 DIAGNOSIS — E11649 Type 2 diabetes mellitus with hypoglycemia without coma: Secondary | ICD-10-CM | POA: Diagnosis not present

## 2022-07-17 DIAGNOSIS — I480 Paroxysmal atrial fibrillation: Secondary | ICD-10-CM | POA: Diagnosis not present

## 2022-07-17 DIAGNOSIS — E11649 Type 2 diabetes mellitus with hypoglycemia without coma: Secondary | ICD-10-CM | POA: Diagnosis not present

## 2022-07-17 DIAGNOSIS — R1310 Dysphagia, unspecified: Secondary | ICD-10-CM | POA: Diagnosis not present

## 2022-07-17 DIAGNOSIS — G20C Parkinsonism, unspecified: Secondary | ICD-10-CM | POA: Diagnosis not present

## 2022-07-18 DIAGNOSIS — I480 Paroxysmal atrial fibrillation: Secondary | ICD-10-CM | POA: Diagnosis not present

## 2022-07-18 DIAGNOSIS — G20C Parkinsonism, unspecified: Secondary | ICD-10-CM | POA: Diagnosis not present

## 2022-07-18 DIAGNOSIS — R1310 Dysphagia, unspecified: Secondary | ICD-10-CM | POA: Diagnosis not present

## 2022-07-18 DIAGNOSIS — E11649 Type 2 diabetes mellitus with hypoglycemia without coma: Secondary | ICD-10-CM | POA: Diagnosis not present

## 2022-07-21 DIAGNOSIS — R1310 Dysphagia, unspecified: Secondary | ICD-10-CM | POA: Diagnosis not present

## 2022-07-21 DIAGNOSIS — I480 Paroxysmal atrial fibrillation: Secondary | ICD-10-CM | POA: Diagnosis not present

## 2022-07-21 DIAGNOSIS — G20C Parkinsonism, unspecified: Secondary | ICD-10-CM | POA: Diagnosis not present

## 2022-07-21 DIAGNOSIS — N390 Urinary tract infection, site not specified: Secondary | ICD-10-CM | POA: Diagnosis not present

## 2022-07-21 DIAGNOSIS — E11649 Type 2 diabetes mellitus with hypoglycemia without coma: Secondary | ICD-10-CM | POA: Diagnosis not present

## 2022-07-22 DIAGNOSIS — G20C Parkinsonism, unspecified: Secondary | ICD-10-CM | POA: Diagnosis not present

## 2022-07-22 DIAGNOSIS — I480 Paroxysmal atrial fibrillation: Secondary | ICD-10-CM | POA: Diagnosis not present

## 2022-07-22 DIAGNOSIS — R1310 Dysphagia, unspecified: Secondary | ICD-10-CM | POA: Diagnosis not present

## 2022-07-22 DIAGNOSIS — E1142 Type 2 diabetes mellitus with diabetic polyneuropathy: Secondary | ICD-10-CM | POA: Diagnosis not present

## 2022-07-22 DIAGNOSIS — D508 Other iron deficiency anemias: Secondary | ICD-10-CM | POA: Diagnosis not present

## 2022-07-22 DIAGNOSIS — I10 Essential (primary) hypertension: Secondary | ICD-10-CM | POA: Diagnosis not present

## 2022-07-22 DIAGNOSIS — E11649 Type 2 diabetes mellitus with hypoglycemia without coma: Secondary | ICD-10-CM | POA: Diagnosis not present

## 2022-07-23 DIAGNOSIS — E11649 Type 2 diabetes mellitus with hypoglycemia without coma: Secondary | ICD-10-CM | POA: Diagnosis not present

## 2022-07-23 DIAGNOSIS — R1310 Dysphagia, unspecified: Secondary | ICD-10-CM | POA: Diagnosis not present

## 2022-07-23 DIAGNOSIS — G20C Parkinsonism, unspecified: Secondary | ICD-10-CM | POA: Diagnosis not present

## 2022-07-23 DIAGNOSIS — I480 Paroxysmal atrial fibrillation: Secondary | ICD-10-CM | POA: Diagnosis not present

## 2022-07-24 DIAGNOSIS — R1310 Dysphagia, unspecified: Secondary | ICD-10-CM | POA: Diagnosis not present

## 2022-07-24 DIAGNOSIS — E11649 Type 2 diabetes mellitus with hypoglycemia without coma: Secondary | ICD-10-CM | POA: Diagnosis not present

## 2022-07-24 DIAGNOSIS — I480 Paroxysmal atrial fibrillation: Secondary | ICD-10-CM | POA: Diagnosis not present

## 2022-07-24 DIAGNOSIS — G20C Parkinsonism, unspecified: Secondary | ICD-10-CM | POA: Diagnosis not present

## 2022-07-25 DIAGNOSIS — U071 COVID-19: Secondary | ICD-10-CM | POA: Diagnosis not present

## 2022-07-25 DIAGNOSIS — G20C Parkinsonism, unspecified: Secondary | ICD-10-CM | POA: Diagnosis not present

## 2022-07-25 DIAGNOSIS — G20A1 Parkinson's disease without dyskinesia, without mention of fluctuations: Secondary | ICD-10-CM | POA: Diagnosis not present

## 2022-07-25 DIAGNOSIS — I480 Paroxysmal atrial fibrillation: Secondary | ICD-10-CM | POA: Diagnosis not present

## 2022-07-25 DIAGNOSIS — E11649 Type 2 diabetes mellitus with hypoglycemia without coma: Secondary | ICD-10-CM | POA: Diagnosis not present

## 2022-07-25 DIAGNOSIS — R1310 Dysphagia, unspecified: Secondary | ICD-10-CM | POA: Diagnosis not present

## 2022-07-25 DIAGNOSIS — E1142 Type 2 diabetes mellitus with diabetic polyneuropathy: Secondary | ICD-10-CM | POA: Diagnosis not present

## 2022-07-30 ENCOUNTER — Telehealth: Payer: Self-pay | Admitting: *Deleted

## 2022-07-30 NOTE — Patient Outreach (Signed)
  Care Coordination   07/30/2022 Name: [name] MRN: [id number] DOB: [birth year]   Care Coordination Outreach Attempts:  An unsuccessful telephone outreach was attempted today to offer the patient information about available care coordination services as a benefit of their health plan.   Follow Up Plan:  Additional outreach attempts will be made to offer the patient care coordination information and services.   Encounter Outcome:  No Answer  Care Coordination Interventions Activated:  No   Care Coordination Interventions:  No, not indicated    Raina Mina, RN Care Management Coordinator [place] Office [phone]

## 2022-08-04 IMAGING — CT CT CERVICAL SPINE W/O CM
3 of 4 series · 13 of 33 positions shown, 16 images · non-contrast
Comparison: CT head/cervical spine 07/18/2018.

CLINICAL DATA: Head trauma, minor.

EXAM:
CT HEAD WITHOUT CONTRAST
CT CERVICAL SPINE WITHOUT CONTRAST
TECHNIQUE: Multidetector CT imaging of the head and cervical spine was
performed following the standard protocol without intravenous
contrast. Multiplanar CT image reconstructions of the cervical spine
were also generated.

[Series 1: c_spine 2.0 st · axial · 0.38mm/px · z∈[-204,-68]mm · 5 of 104 slices shown, 7 images]
[im 18/104  soft-tissue]
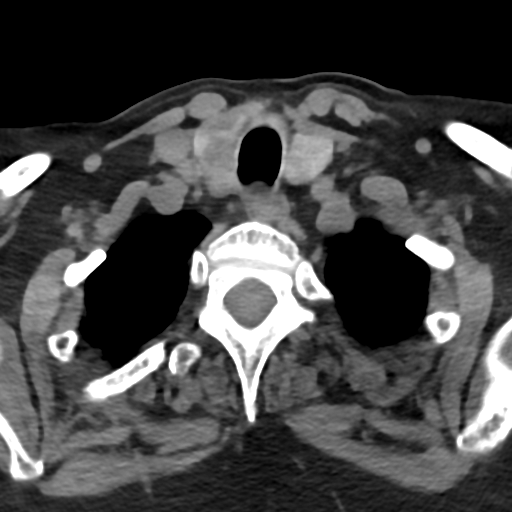
[im 18/104  bone]
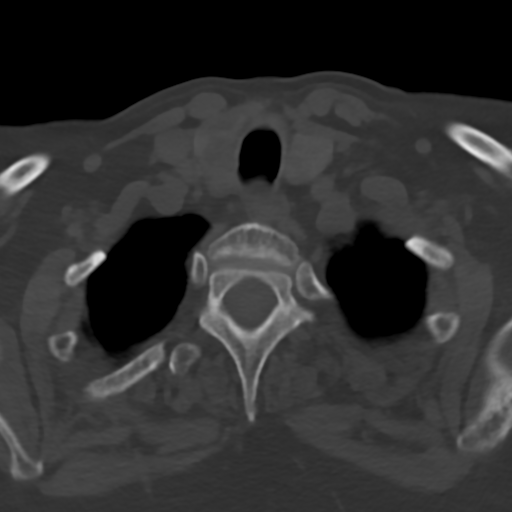
[im 35/104  bone]
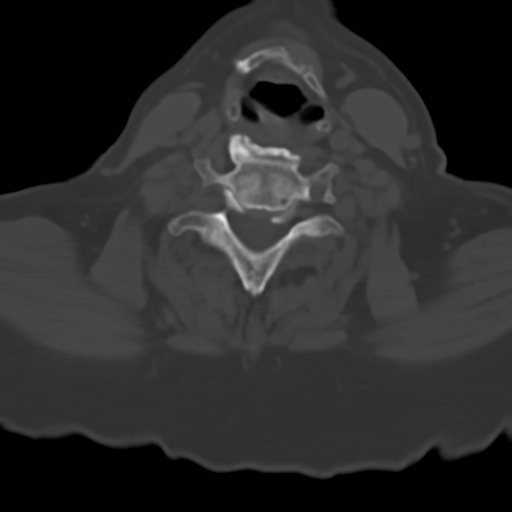
[im 52/104  bone]
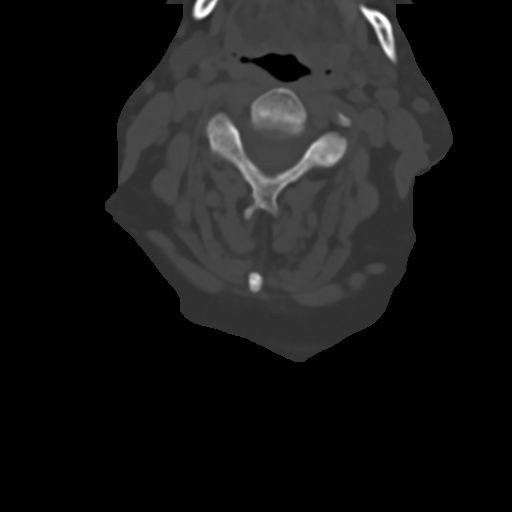
[im 69/104  bone]
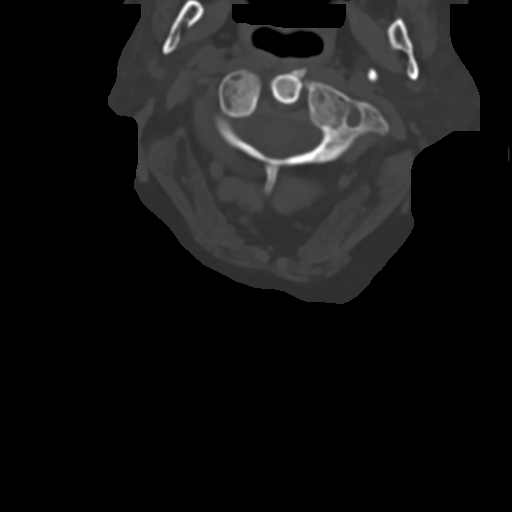
[im 86/104  soft-tissue]
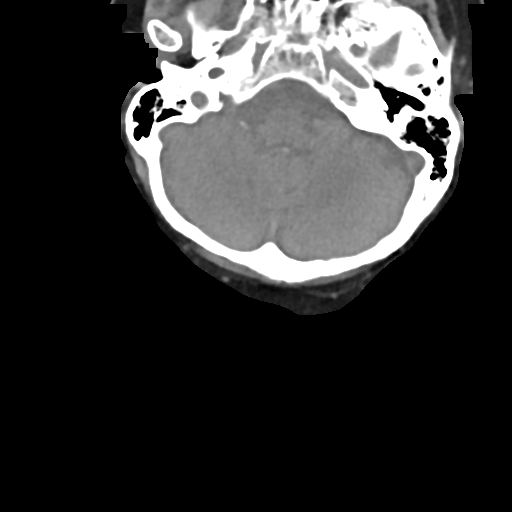
[im 86/104  bone]
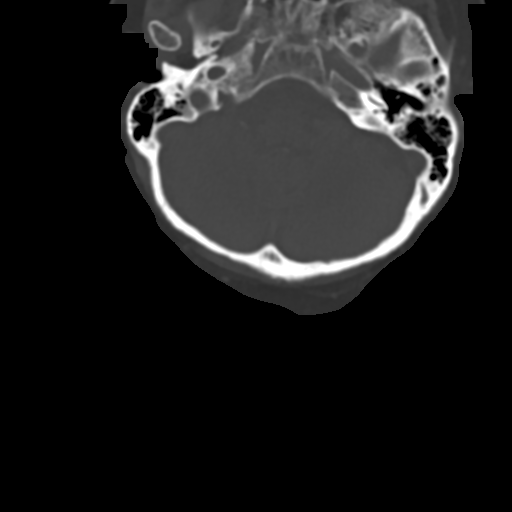

[Series 5: c_spine 2.0 sag bone · sagittal · 0.41mm/px · 5 of 81 slices shown, 6 images]
[im 27/81  bone]
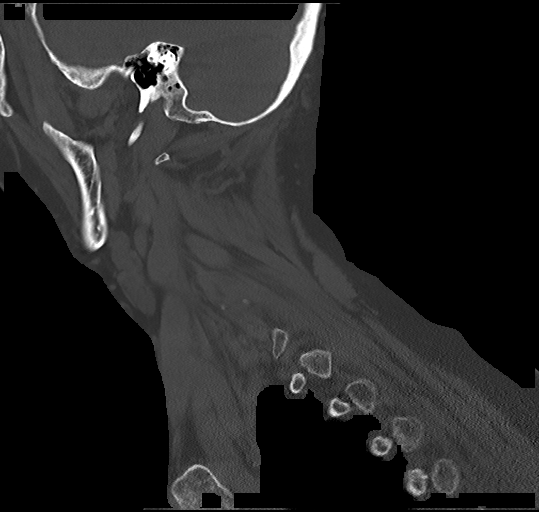
[im 34/81  bone]
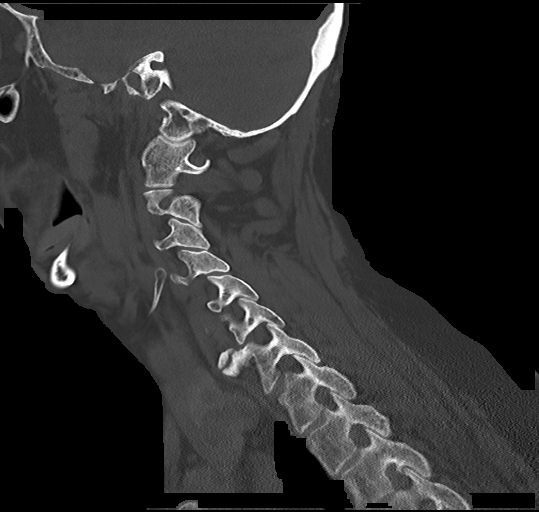
[im 41/81  soft-tissue]
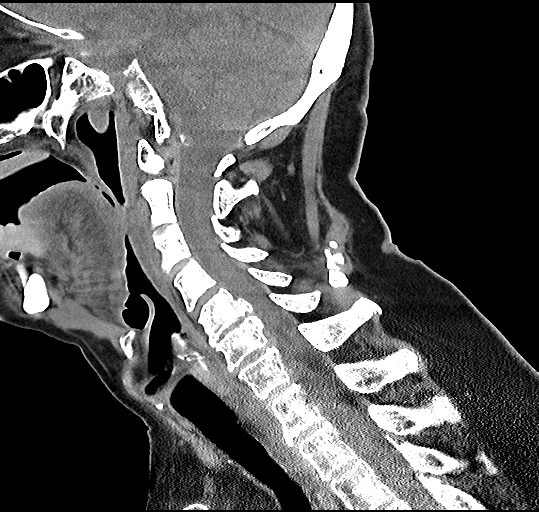
[im 41/81  bone]
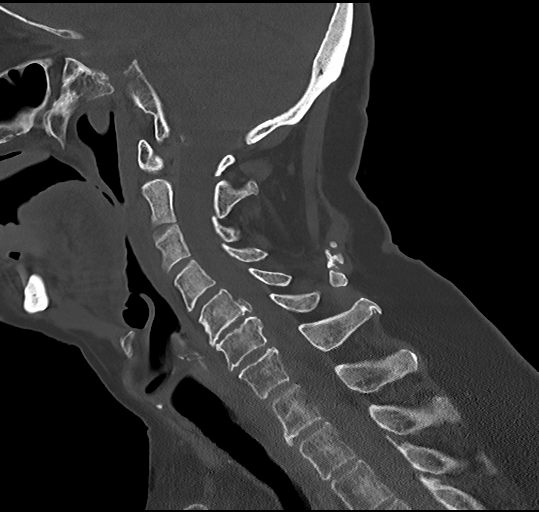
[im 47/81  bone]
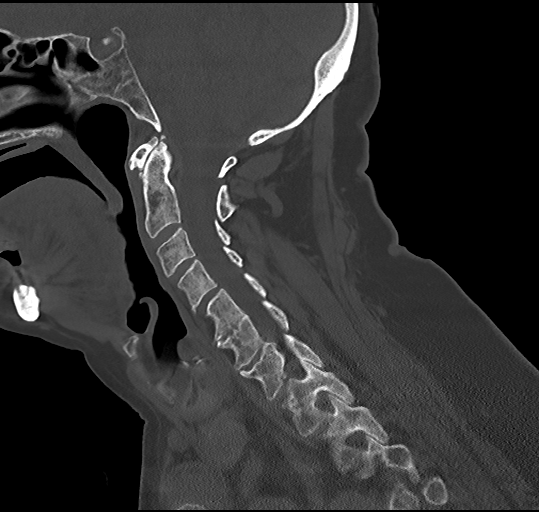
[im 54/81  bone]
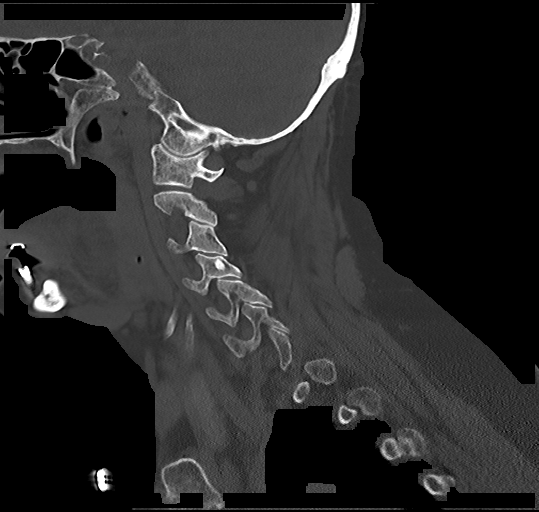

[Series 9: c_spine 2.0 cor bone · coronal · 0.30mm/px · 3 of 90 slices shown]
[im 18/90  bone]
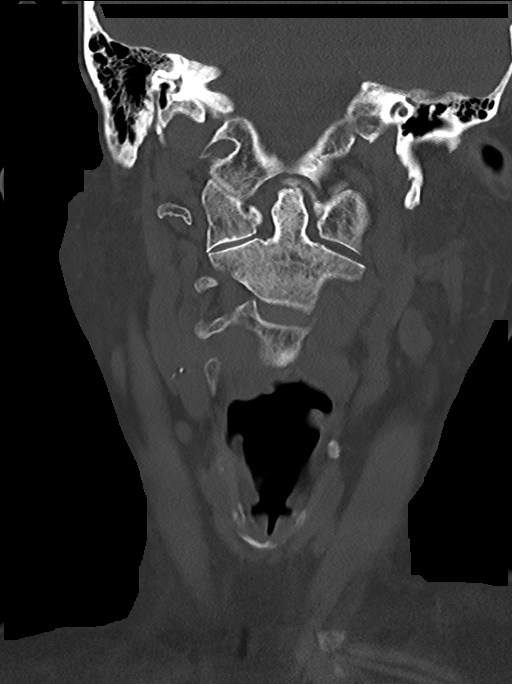
[im 36/90  bone]
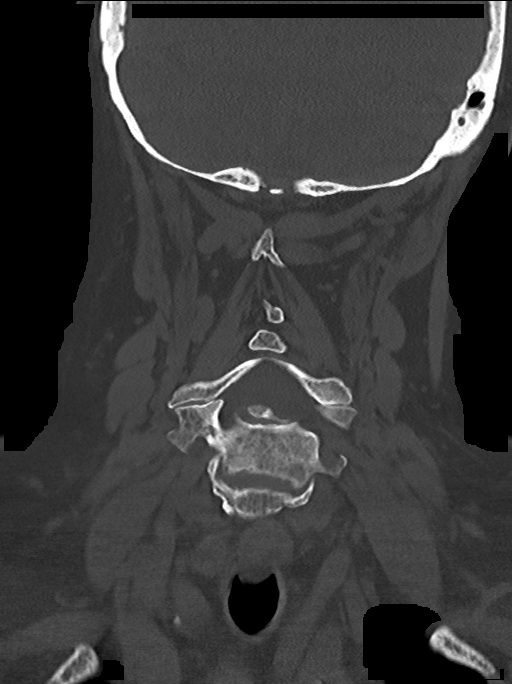
[im 54/90  bone]
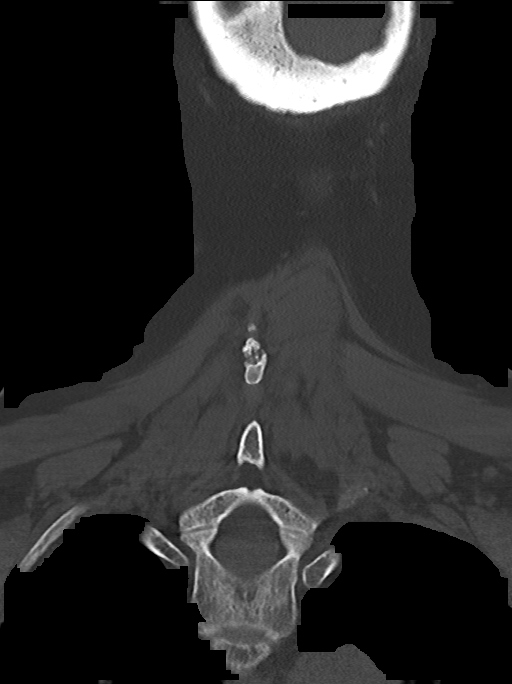

[13 of 33 positions shown; findings below may reference images not displayed]

FINDINGS: CT HEAD FINDINGS

Brain:

Stable, moderate generalized cerebral atrophy.

Stable, mild ill-defined hypoattenuation within the cerebral white
matter which is nonspecific, but likely reflects sequela of chronic
small vessel ischemic disease.

There is no acute intracranial hemorrhage.

No demarcated cortical infarct.

No extra-axial fluid collection.

No evidence of intracranial mass.

No midline shift.

Vascular: No hyperdense vessel.  Atherosclerotic calcifications.

Skull: Normal. Negative for fracture or focal lesion.

Sinuses/Orbits: Visualized orbits show no acute finding. Mild
ethmoid sinus mucosal thickening. Frothy secretions within the left
frontal sinus. No significant mastoid effusion.

CT CERVICAL SPINE FINDINGS

Alignment: A mild cervical levocurvature may be positional. Trace
C4-C5 grade 1 anterolisthesis

Skull base and vertebrae: The basion-dental and atlanto-dental
intervals are maintained.No evidence of acute fracture to the
cervical spine. 4 mm sclerotic focus within the left C4 articular
pillar, nonspecific but possibly reflecting a bone island.

Soft tissues and spinal canal: No prevertebral fluid or swelling. No
visible canal hematoma.

Disc levels: Cervical spondylosis. Most notably at C5-C6 and C6-C7
there is moderate disc degeneration with posterior disc osteophytes
and uncovertebral hypertrophy. The C5-C6 posterior disc osteophyte
contributes to moderate/severe spinal canal stenosis.
Moderate/severe spinal canal stenosis at these levels with possible
spinal cord mass effect.

Upper chest: No consolidation within the imaged lung apices. No
visible pneumothorax

Other: Multiple thyroid nodules, the largest arising from the right
lobe measuring 2.2 cm
IMPRESSION: CT head:

1. No evidence of acute intracranial abnormality.
2. Stable, moderate generalized cerebral atrophy.
3. Stable, mild cerebral white matter chronic small vessel ischemic
disease.
4. Left frontal sinusitis.  Mild ethmoid sinus mucosal thickening.

CT cervical spine:

1. No evidence of acute fracture to the cervical spine.
2. Minimal C4-C5 grade 1 anterolisthesis.
3. Cervical spondylosis greatest at C5-C6 and C6-C7 where prominent
posterior disc osteophyte complexes contribute to multifactorial
moderate/severe spinal canal stenosis with possible spinal cord mass
effect.
4. Multiple thyroid nodules, the largest within the right lobe
measuring 2.2 cm. Nonemergent thyroid ultrasound is recommended for
further evaluation.

## 2022-08-04 IMAGING — CT CT HEAD W/O CM
4 series · 15 of 47 positions shown, 17 images · non-contrast
Comparison: CT head/cervical spine 07/18/2018.

CLINICAL DATA: Head trauma, minor.

EXAM:
CT HEAD WITHOUT CONTRAST
CT CERVICAL SPINE WITHOUT CONTRAST
TECHNIQUE: Multidetector CT imaging of the head and cervical spine was
performed following the standard protocol without intravenous
contrast. Multiplanar CT image reconstructions of the cervical spine
were also generated.

[Series 3: head without · axial · non-contrast · 0.42mm/px · z∈[-68,+52]mm · 7 of 33 slices shown, 9 images]
[im 5/33  brain]
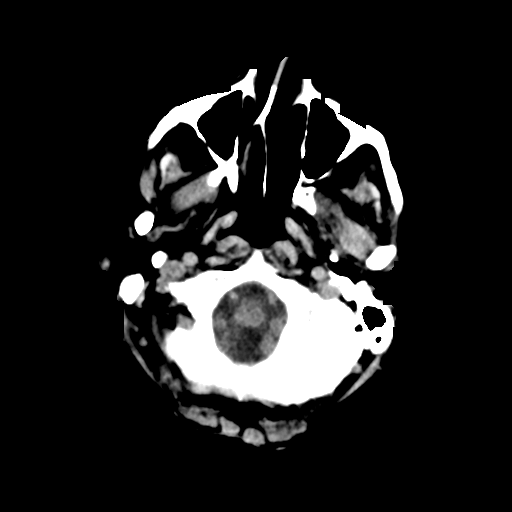
[im 5/33  bone]
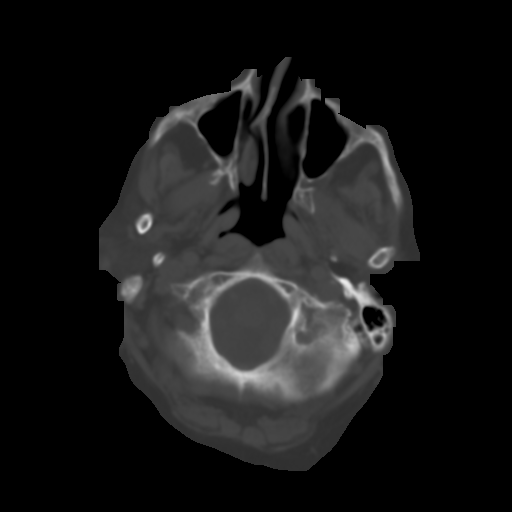
[im 9/33  brain]
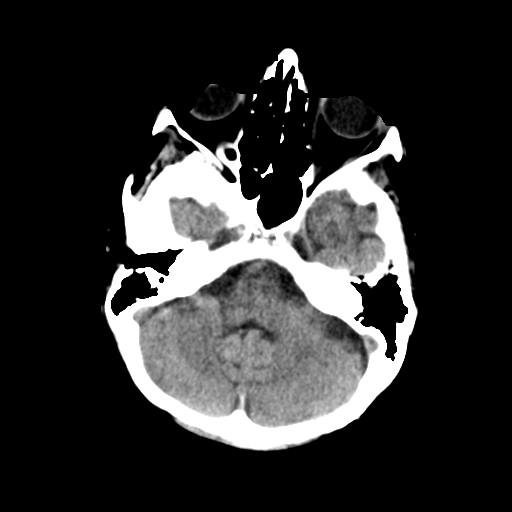
[im 13/33  brain]
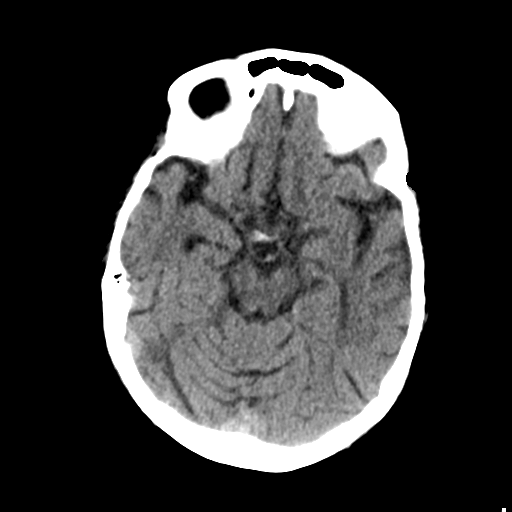
[im 17/33  brain]
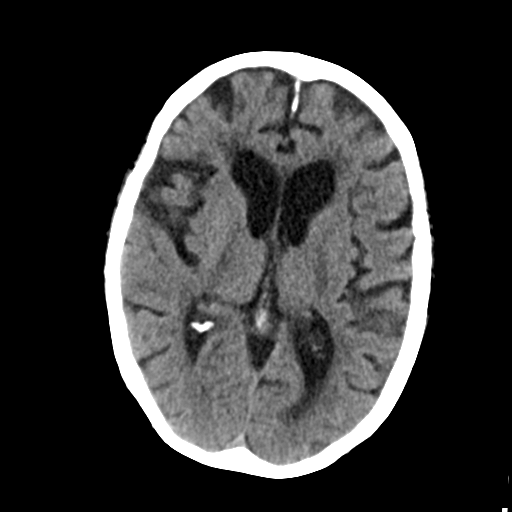
[im 21/33  brain]
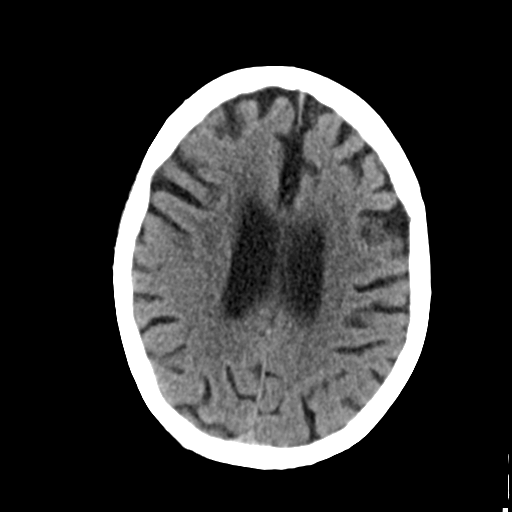
[im 21/33  bone]
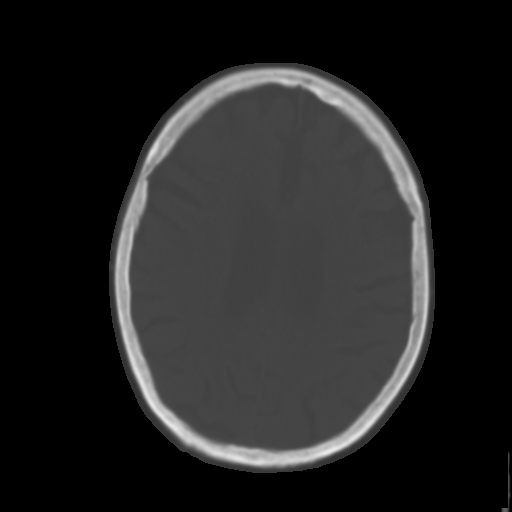
[im 25/33  brain]
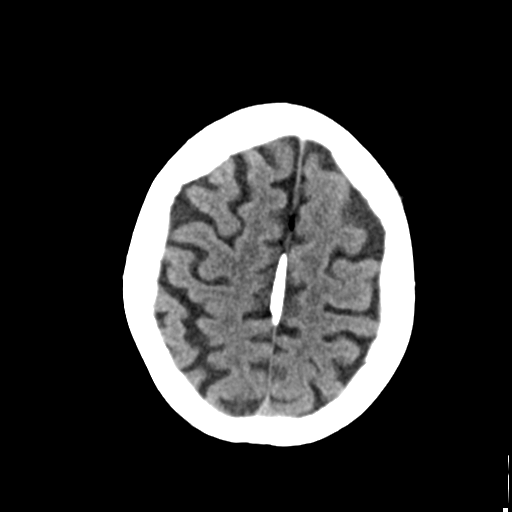
[im 29/33  brain]
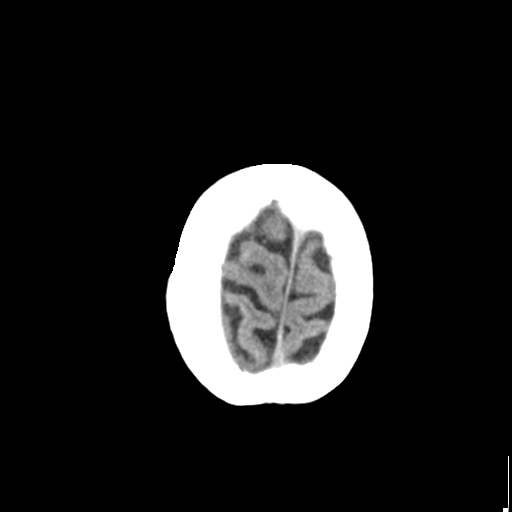

[Series 5: head bone · axial · 0.42mm/px · z∈[-72,-56]mm · 2 of 81 slices shown]
[im 9/81  bone]
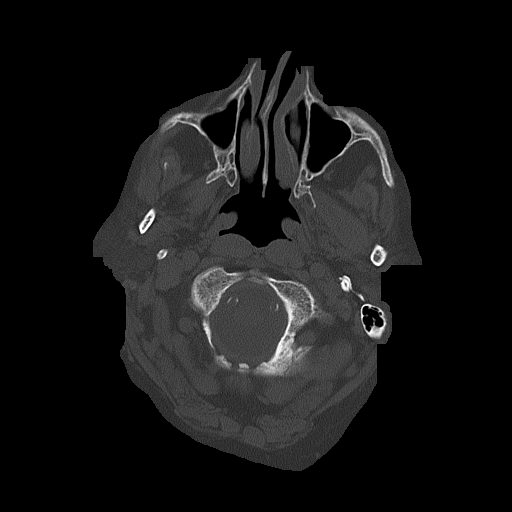
[im 17/81  bone]
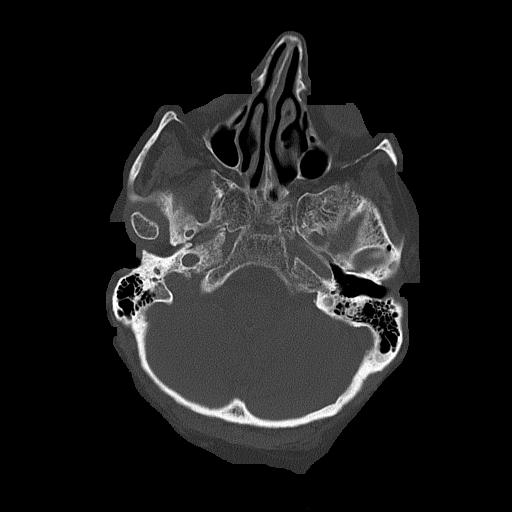

[Series 6: head without cor · coronal · non-contrast · 0.36mm/px · 3 of 67 slices shown]
[im 23/67  brain]
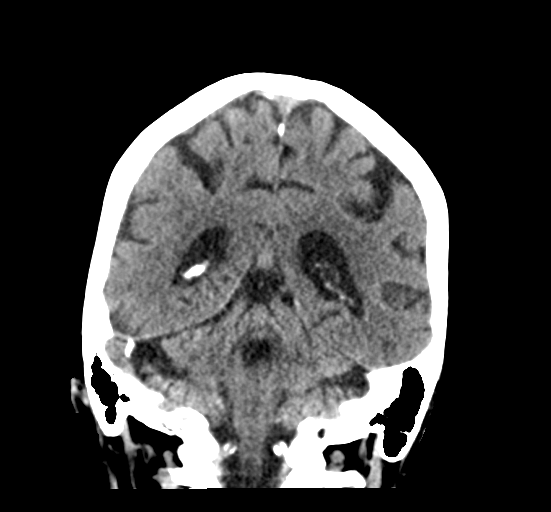
[im 30/67  brain]
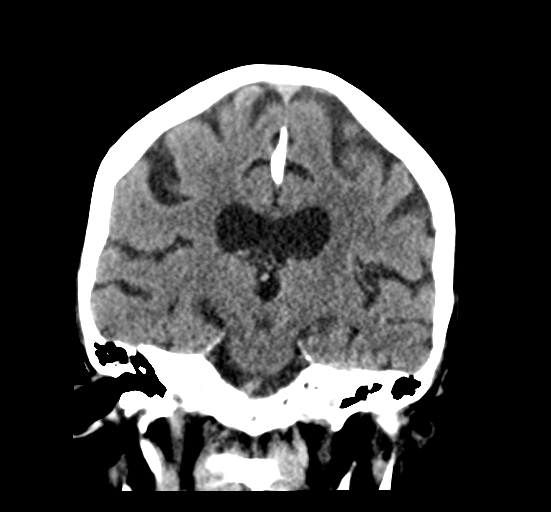
[im 37/67  brain]
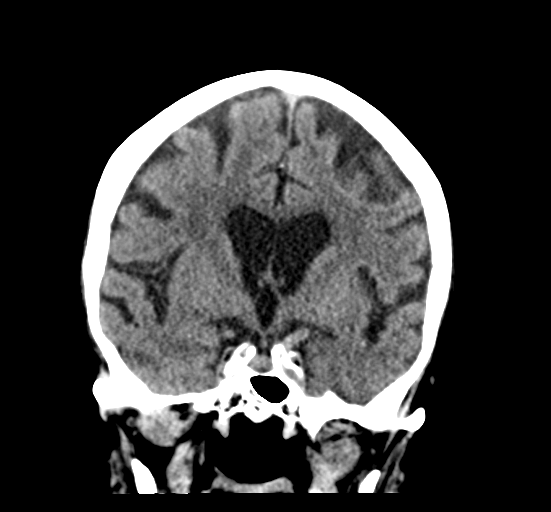

[Series 7: head without sag · sagittal · non-contrast · 0.33mm/px · 3 of 67 slices shown]
[im 23/67  brain]
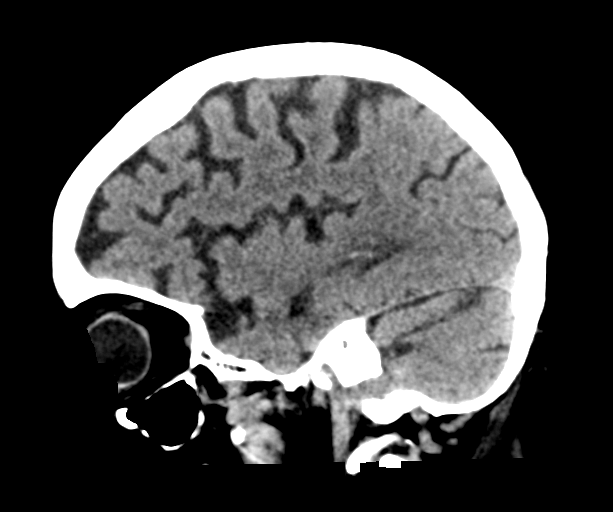
[im 34/67  brain]
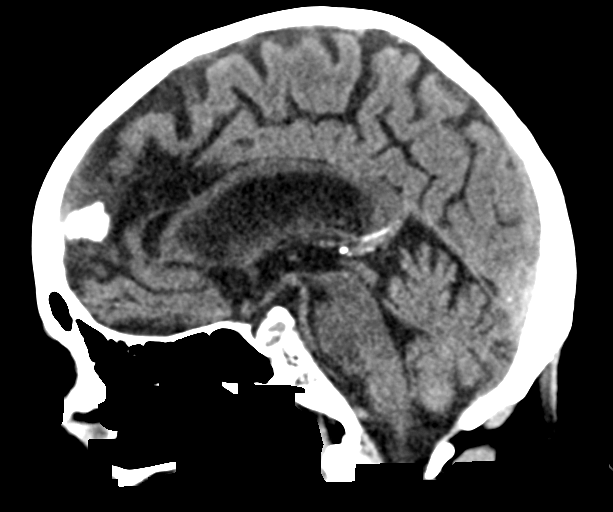
[im 45/67  brain]
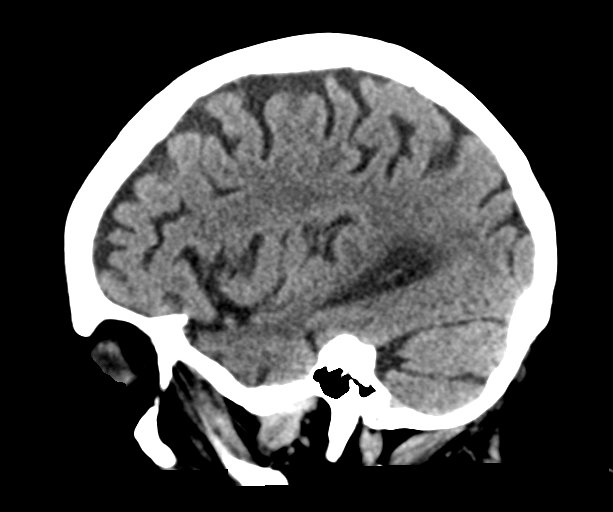

[15 of 47 positions shown; findings below may reference images not displayed]

FINDINGS: CT HEAD FINDINGS

Brain:

Stable, moderate generalized cerebral atrophy.

Stable, mild ill-defined hypoattenuation within the cerebral white
matter which is nonspecific, but likely reflects sequela of chronic
small vessel ischemic disease.

There is no acute intracranial hemorrhage.

No demarcated cortical infarct.

No extra-axial fluid collection.

No evidence of intracranial mass.

No midline shift.

Vascular: No hyperdense vessel.  Atherosclerotic calcifications.

Skull: Normal. Negative for fracture or focal lesion.

Sinuses/Orbits: Visualized orbits show no acute finding. Mild
ethmoid sinus mucosal thickening. Frothy secretions within the left
frontal sinus. No significant mastoid effusion.

CT CERVICAL SPINE FINDINGS

Alignment: A mild cervical levocurvature may be positional. Trace
C4-C5 grade 1 anterolisthesis

Skull base and vertebrae: The basion-dental and atlanto-dental
intervals are maintained.No evidence of acute fracture to the
cervical spine. 4 mm sclerotic focus within the left C4 articular
pillar, nonspecific but possibly reflecting a bone island.

Soft tissues and spinal canal: No prevertebral fluid or swelling. No
visible canal hematoma.

Disc levels: Cervical spondylosis. Most notably at C5-C6 and C6-C7
there is moderate disc degeneration with posterior disc osteophytes
and uncovertebral hypertrophy. The C5-C6 posterior disc osteophyte
contributes to moderate/severe spinal canal stenosis.
Moderate/severe spinal canal stenosis at these levels with possible
spinal cord mass effect.

Upper chest: No consolidation within the imaged lung apices. No
visible pneumothorax

Other: Multiple thyroid nodules, the largest arising from the right
lobe measuring 2.2 cm
IMPRESSION: CT head:

1. No evidence of acute intracranial abnormality.
2. Stable, moderate generalized cerebral atrophy.
3. Stable, mild cerebral white matter chronic small vessel ischemic
disease.
4. Left frontal sinusitis.  Mild ethmoid sinus mucosal thickening.

CT cervical spine:

1. No evidence of acute fracture to the cervical spine.
2. Minimal C4-C5 grade 1 anterolisthesis.
3. Cervical spondylosis greatest at C5-C6 and C6-C7 where prominent
posterior disc osteophyte complexes contribute to multifactorial
moderate/severe spinal canal stenosis with possible spinal cord mass
effect.
4. Multiple thyroid nodules, the largest within the right lobe
measuring 2.2 cm. Nonemergent thyroid ultrasound is recommended for
further evaluation.

## 2022-08-04 IMAGING — DX DG CHEST 1V PORT
1 series · 1 of 1 positions shown · non-contrast
Comparison: None.

CLINICAL DATA: Weakness

EXAM:
PORTABLE CHEST 1 VIEW

[chest ap]
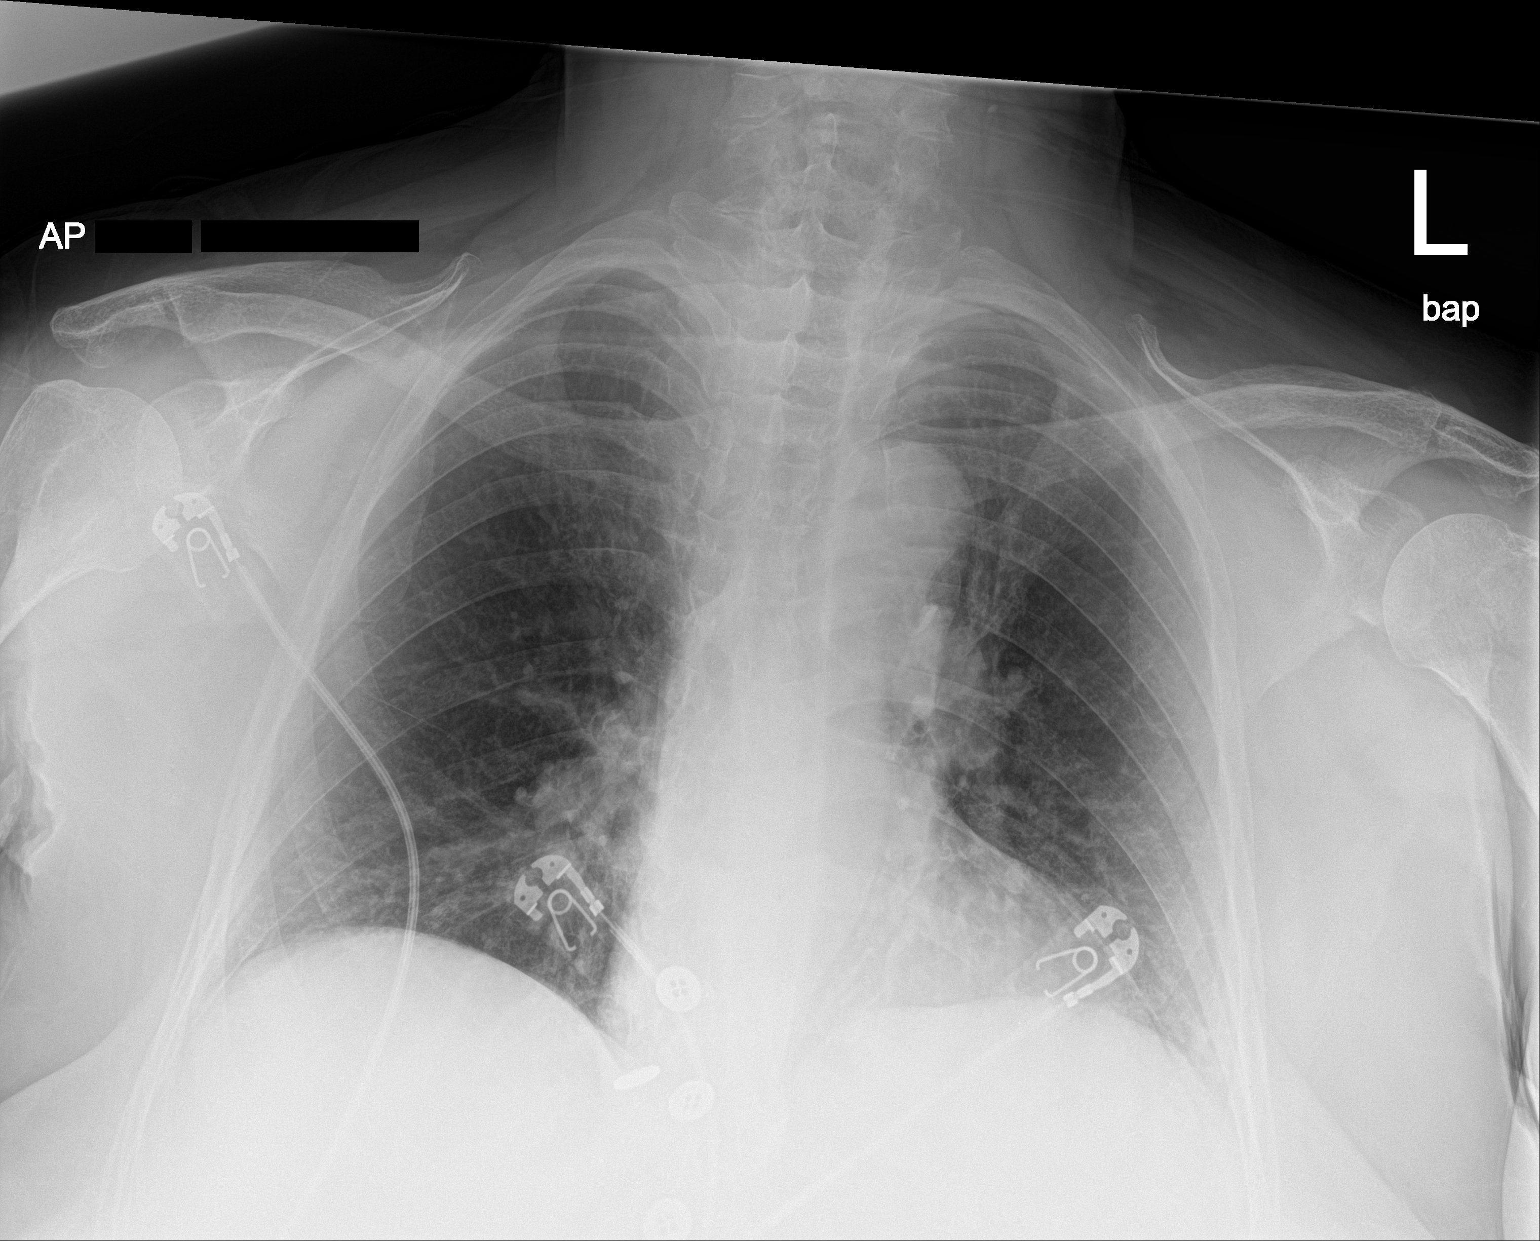

[1 of 1 positions shown; findings below may reference images not displayed]

FINDINGS: Single frontal view of the chest demonstrates an unremarkable
cardiac silhouette. No airspace disease, effusion, or pneumothorax.
No acute bony abnormality.
IMPRESSION: 1. No acute intrathoracic process.

## 2022-08-05 ENCOUNTER — Telehealth: Payer: Self-pay | Admitting: *Deleted

## 2022-08-05 NOTE — Patient Outreach (Signed)
  Care Coordination   08/05/2022 Name: Sherri Pratt MRN: [id number] DOB: 03-Jul-1946   Care Coordination Outreach Attempts:  A second unsuccessful outreach was attempted today to offer the patient with information about available care coordination services as a benefit of their health plan.     Follow Up Plan:  Additional outreach attempts will be made to offer the patient care coordination information and services.   Encounter Outcome:  No Answer  Care Coordination Interventions Activated:  No   Care Coordination Interventions:  No, not indicated    Raina Mina, RN Care Management Coordinator [place] Office [phone]

## 2022-08-07 DIAGNOSIS — B351 Tinea unguium: Secondary | ICD-10-CM | POA: Diagnosis not present

## 2022-08-07 DIAGNOSIS — E1159 Type 2 diabetes mellitus with other circulatory complications: Secondary | ICD-10-CM | POA: Diagnosis not present

## 2022-08-19 DIAGNOSIS — I480 Paroxysmal atrial fibrillation: Secondary | ICD-10-CM | POA: Diagnosis not present

## 2022-08-19 DIAGNOSIS — N390 Urinary tract infection, site not specified: Secondary | ICD-10-CM | POA: Diagnosis not present

## 2022-08-19 DIAGNOSIS — E1142 Type 2 diabetes mellitus with diabetic polyneuropathy: Secondary | ICD-10-CM | POA: Diagnosis not present

## 2022-08-19 DIAGNOSIS — G20A1 Parkinson's disease without dyskinesia, without mention of fluctuations: Secondary | ICD-10-CM | POA: Diagnosis not present

## 2022-09-15 DIAGNOSIS — G20B2 Parkinson's disease with dyskinesia, with fluctuations: Secondary | ICD-10-CM | POA: Diagnosis not present

## 2022-09-15 DIAGNOSIS — I10 Essential (primary) hypertension: Secondary | ICD-10-CM | POA: Diagnosis not present

## 2022-09-15 DIAGNOSIS — E1142 Type 2 diabetes mellitus with diabetic polyneuropathy: Secondary | ICD-10-CM | POA: Diagnosis not present

## 2022-09-15 DIAGNOSIS — I480 Paroxysmal atrial fibrillation: Secondary | ICD-10-CM | POA: Diagnosis not present

## 2022-09-16 ENCOUNTER — Telehealth: Payer: Self-pay | Admitting: Adult Health

## 2022-09-16 DIAGNOSIS — I10 Essential (primary) hypertension: Secondary | ICD-10-CM | POA: Diagnosis not present

## 2022-09-16 DIAGNOSIS — E1142 Type 2 diabetes mellitus with diabetic polyneuropathy: Secondary | ICD-10-CM | POA: Diagnosis not present

## 2022-09-16 DIAGNOSIS — G20B2 Parkinson's disease with dyskinesia, with fluctuations: Secondary | ICD-10-CM | POA: Diagnosis not present

## 2022-09-16 DIAGNOSIS — I480 Paroxysmal atrial fibrillation: Secondary | ICD-10-CM | POA: Diagnosis not present

## 2022-09-16 NOTE — Telephone Encounter (Signed)
Tried calling patient to  schedule Medicare Annual Wellness Visit (AWV) either virtually or phone  Last AWV  11/13/14 please schedule with Sherri Pratt   45 min for awv-i and in office appointments 30 min for awv-s  phone/virtual appointments

## 2022-10-03 DIAGNOSIS — Z23 Encounter for immunization: Secondary | ICD-10-CM | POA: Diagnosis not present

## 2022-10-14 DIAGNOSIS — I480 Paroxysmal atrial fibrillation: Secondary | ICD-10-CM | POA: Diagnosis not present

## 2022-10-14 DIAGNOSIS — M1A9XX0 Chronic gout, unspecified, without tophus (tophi): Secondary | ICD-10-CM | POA: Diagnosis not present

## 2022-10-14 DIAGNOSIS — I10 Essential (primary) hypertension: Secondary | ICD-10-CM | POA: Diagnosis not present

## 2022-10-14 DIAGNOSIS — E1142 Type 2 diabetes mellitus with diabetic polyneuropathy: Secondary | ICD-10-CM | POA: Diagnosis not present

## 2022-10-17 DIAGNOSIS — I10 Essential (primary) hypertension: Secondary | ICD-10-CM | POA: Diagnosis not present

## 2022-10-17 DIAGNOSIS — E1142 Type 2 diabetes mellitus with diabetic polyneuropathy: Secondary | ICD-10-CM | POA: Diagnosis not present

## 2022-10-17 DIAGNOSIS — I480 Paroxysmal atrial fibrillation: Secondary | ICD-10-CM | POA: Diagnosis not present

## 2022-10-29 DIAGNOSIS — N390 Urinary tract infection, site not specified: Secondary | ICD-10-CM | POA: Diagnosis not present

## 2022-11-05 DIAGNOSIS — F5101 Primary insomnia: Secondary | ICD-10-CM | POA: Diagnosis not present

## 2022-11-05 DIAGNOSIS — F064 Anxiety disorder due to known physiological condition: Secondary | ICD-10-CM | POA: Diagnosis not present

## 2022-11-12 DIAGNOSIS — F064 Anxiety disorder due to known physiological condition: Secondary | ICD-10-CM | POA: Diagnosis not present

## 2022-11-12 DIAGNOSIS — F5101 Primary insomnia: Secondary | ICD-10-CM | POA: Diagnosis not present

## 2022-11-14 DIAGNOSIS — I10 Essential (primary) hypertension: Secondary | ICD-10-CM | POA: Diagnosis not present

## 2022-11-14 DIAGNOSIS — E1142 Type 2 diabetes mellitus with diabetic polyneuropathy: Secondary | ICD-10-CM | POA: Diagnosis not present

## 2022-11-14 DIAGNOSIS — I480 Paroxysmal atrial fibrillation: Secondary | ICD-10-CM | POA: Diagnosis not present

## 2022-11-14 DIAGNOSIS — G20B2 Parkinson's disease with dyskinesia, with fluctuations: Secondary | ICD-10-CM | POA: Diagnosis not present

## 2022-11-18 DIAGNOSIS — I480 Paroxysmal atrial fibrillation: Secondary | ICD-10-CM | POA: Diagnosis not present

## 2022-11-18 DIAGNOSIS — I10 Essential (primary) hypertension: Secondary | ICD-10-CM | POA: Diagnosis not present

## 2022-11-18 DIAGNOSIS — G20B2 Parkinson's disease with dyskinesia, with fluctuations: Secondary | ICD-10-CM | POA: Diagnosis not present

## 2022-11-18 DIAGNOSIS — E1142 Type 2 diabetes mellitus with diabetic polyneuropathy: Secondary | ICD-10-CM | POA: Diagnosis not present

## 2022-11-19 DIAGNOSIS — F5101 Primary insomnia: Secondary | ICD-10-CM | POA: Diagnosis not present

## 2022-11-19 DIAGNOSIS — F064 Anxiety disorder due to known physiological condition: Secondary | ICD-10-CM | POA: Diagnosis not present

## 2022-11-24 DIAGNOSIS — H109 Unspecified conjunctivitis: Secondary | ICD-10-CM | POA: Diagnosis not present

## 2022-12-03 DIAGNOSIS — I480 Paroxysmal atrial fibrillation: Secondary | ICD-10-CM | POA: Diagnosis not present

## 2022-12-03 DIAGNOSIS — D508 Other iron deficiency anemias: Secondary | ICD-10-CM | POA: Diagnosis not present

## 2022-12-03 DIAGNOSIS — I10 Essential (primary) hypertension: Secondary | ICD-10-CM | POA: Diagnosis not present

## 2022-12-03 DIAGNOSIS — E119 Type 2 diabetes mellitus without complications: Secondary | ICD-10-CM | POA: Diagnosis not present

## 2022-12-12 DIAGNOSIS — I10 Essential (primary) hypertension: Secondary | ICD-10-CM | POA: Diagnosis not present

## 2022-12-12 DIAGNOSIS — D508 Other iron deficiency anemias: Secondary | ICD-10-CM | POA: Diagnosis not present

## 2022-12-12 DIAGNOSIS — E1142 Type 2 diabetes mellitus with diabetic polyneuropathy: Secondary | ICD-10-CM | POA: Diagnosis not present

## 2022-12-16 DIAGNOSIS — G20B2 Parkinson's disease with dyskinesia, with fluctuations: Secondary | ICD-10-CM | POA: Diagnosis not present

## 2022-12-16 DIAGNOSIS — E119 Type 2 diabetes mellitus without complications: Secondary | ICD-10-CM | POA: Diagnosis not present

## 2022-12-16 DIAGNOSIS — I10 Essential (primary) hypertension: Secondary | ICD-10-CM | POA: Diagnosis not present

## 2022-12-16 DIAGNOSIS — D508 Other iron deficiency anemias: Secondary | ICD-10-CM | POA: Diagnosis not present

## 2022-12-16 DIAGNOSIS — E1142 Type 2 diabetes mellitus with diabetic polyneuropathy: Secondary | ICD-10-CM | POA: Diagnosis not present

## 2022-12-17 DIAGNOSIS — F064 Anxiety disorder due to known physiological condition: Secondary | ICD-10-CM | POA: Diagnosis not present

## 2022-12-17 DIAGNOSIS — F5101 Primary insomnia: Secondary | ICD-10-CM | POA: Diagnosis not present

## 2022-12-25 DIAGNOSIS — D529 Folate deficiency anemia, unspecified: Secondary | ICD-10-CM | POA: Diagnosis not present

## 2022-12-25 DIAGNOSIS — E11649 Type 2 diabetes mellitus with hypoglycemia without coma: Secondary | ICD-10-CM | POA: Diagnosis not present

## 2022-12-25 DIAGNOSIS — J9601 Acute respiratory failure with hypoxia: Secondary | ICD-10-CM | POA: Diagnosis not present

## 2022-12-25 DIAGNOSIS — I480 Paroxysmal atrial fibrillation: Secondary | ICD-10-CM | POA: Diagnosis not present

## 2022-12-25 DIAGNOSIS — G20C Parkinsonism, unspecified: Secondary | ICD-10-CM | POA: Diagnosis not present

## 2022-12-25 DIAGNOSIS — I2510 Atherosclerotic heart disease of native coronary artery without angina pectoris: Secondary | ICD-10-CM | POA: Diagnosis not present

## 2022-12-25 DIAGNOSIS — M6281 Muscle weakness (generalized): Secondary | ICD-10-CM | POA: Diagnosis not present

## 2022-12-25 DIAGNOSIS — M810 Age-related osteoporosis without current pathological fracture: Secondary | ICD-10-CM | POA: Diagnosis not present

## 2022-12-26 DIAGNOSIS — M6281 Muscle weakness (generalized): Secondary | ICD-10-CM | POA: Diagnosis not present

## 2022-12-26 DIAGNOSIS — J9601 Acute respiratory failure with hypoxia: Secondary | ICD-10-CM | POA: Diagnosis not present

## 2022-12-26 DIAGNOSIS — E119 Type 2 diabetes mellitus without complications: Secondary | ICD-10-CM | POA: Diagnosis not present

## 2022-12-26 DIAGNOSIS — I480 Paroxysmal atrial fibrillation: Secondary | ICD-10-CM | POA: Diagnosis not present

## 2022-12-26 DIAGNOSIS — G20C Parkinsonism, unspecified: Secondary | ICD-10-CM | POA: Diagnosis not present

## 2022-12-26 DIAGNOSIS — I10 Essential (primary) hypertension: Secondary | ICD-10-CM | POA: Diagnosis not present

## 2022-12-26 DIAGNOSIS — D529 Folate deficiency anemia, unspecified: Secondary | ICD-10-CM | POA: Diagnosis not present

## 2022-12-26 DIAGNOSIS — E11649 Type 2 diabetes mellitus with hypoglycemia without coma: Secondary | ICD-10-CM | POA: Diagnosis not present

## 2022-12-26 DIAGNOSIS — D508 Other iron deficiency anemias: Secondary | ICD-10-CM | POA: Diagnosis not present

## 2022-12-26 DIAGNOSIS — M810 Age-related osteoporosis without current pathological fracture: Secondary | ICD-10-CM | POA: Diagnosis not present

## 2022-12-26 DIAGNOSIS — I2510 Atherosclerotic heart disease of native coronary artery without angina pectoris: Secondary | ICD-10-CM | POA: Diagnosis not present

## 2022-12-29 DIAGNOSIS — I2510 Atherosclerotic heart disease of native coronary artery without angina pectoris: Secondary | ICD-10-CM | POA: Diagnosis not present

## 2022-12-29 DIAGNOSIS — D529 Folate deficiency anemia, unspecified: Secondary | ICD-10-CM | POA: Diagnosis not present

## 2022-12-29 DIAGNOSIS — I480 Paroxysmal atrial fibrillation: Secondary | ICD-10-CM | POA: Diagnosis not present

## 2022-12-29 DIAGNOSIS — G20C Parkinsonism, unspecified: Secondary | ICD-10-CM | POA: Diagnosis not present

## 2022-12-29 DIAGNOSIS — M6281 Muscle weakness (generalized): Secondary | ICD-10-CM | POA: Diagnosis not present

## 2022-12-29 DIAGNOSIS — E11649 Type 2 diabetes mellitus with hypoglycemia without coma: Secondary | ICD-10-CM | POA: Diagnosis not present

## 2022-12-29 DIAGNOSIS — J9601 Acute respiratory failure with hypoxia: Secondary | ICD-10-CM | POA: Diagnosis not present

## 2022-12-29 DIAGNOSIS — M810 Age-related osteoporosis without current pathological fracture: Secondary | ICD-10-CM | POA: Diagnosis not present

## 2022-12-30 DIAGNOSIS — G20C Parkinsonism, unspecified: Secondary | ICD-10-CM | POA: Diagnosis not present

## 2022-12-30 DIAGNOSIS — M810 Age-related osteoporosis without current pathological fracture: Secondary | ICD-10-CM | POA: Diagnosis not present

## 2022-12-30 DIAGNOSIS — J9601 Acute respiratory failure with hypoxia: Secondary | ICD-10-CM | POA: Diagnosis not present

## 2022-12-30 DIAGNOSIS — I480 Paroxysmal atrial fibrillation: Secondary | ICD-10-CM | POA: Diagnosis not present

## 2022-12-30 DIAGNOSIS — M6281 Muscle weakness (generalized): Secondary | ICD-10-CM | POA: Diagnosis not present

## 2022-12-30 DIAGNOSIS — E11649 Type 2 diabetes mellitus with hypoglycemia without coma: Secondary | ICD-10-CM | POA: Diagnosis not present

## 2022-12-30 DIAGNOSIS — I2510 Atherosclerotic heart disease of native coronary artery without angina pectoris: Secondary | ICD-10-CM | POA: Diagnosis not present

## 2022-12-30 DIAGNOSIS — D529 Folate deficiency anemia, unspecified: Secondary | ICD-10-CM | POA: Diagnosis not present

## 2022-12-31 DIAGNOSIS — M6281 Muscle weakness (generalized): Secondary | ICD-10-CM | POA: Diagnosis not present

## 2022-12-31 DIAGNOSIS — I2510 Atherosclerotic heart disease of native coronary artery without angina pectoris: Secondary | ICD-10-CM | POA: Diagnosis not present

## 2022-12-31 DIAGNOSIS — J9601 Acute respiratory failure with hypoxia: Secondary | ICD-10-CM | POA: Diagnosis not present

## 2022-12-31 DIAGNOSIS — D529 Folate deficiency anemia, unspecified: Secondary | ICD-10-CM | POA: Diagnosis not present

## 2022-12-31 DIAGNOSIS — G20C Parkinsonism, unspecified: Secondary | ICD-10-CM | POA: Diagnosis not present

## 2022-12-31 DIAGNOSIS — M810 Age-related osteoporosis without current pathological fracture: Secondary | ICD-10-CM | POA: Diagnosis not present

## 2022-12-31 DIAGNOSIS — I480 Paroxysmal atrial fibrillation: Secondary | ICD-10-CM | POA: Diagnosis not present

## 2022-12-31 DIAGNOSIS — E11649 Type 2 diabetes mellitus with hypoglycemia without coma: Secondary | ICD-10-CM | POA: Diagnosis not present

## 2023-01-01 DIAGNOSIS — E11649 Type 2 diabetes mellitus with hypoglycemia without coma: Secondary | ICD-10-CM | POA: Diagnosis not present

## 2023-01-01 DIAGNOSIS — M810 Age-related osteoporosis without current pathological fracture: Secondary | ICD-10-CM | POA: Diagnosis not present

## 2023-01-01 DIAGNOSIS — D529 Folate deficiency anemia, unspecified: Secondary | ICD-10-CM | POA: Diagnosis not present

## 2023-01-01 DIAGNOSIS — G20C Parkinsonism, unspecified: Secondary | ICD-10-CM | POA: Diagnosis not present

## 2023-01-01 DIAGNOSIS — M6281 Muscle weakness (generalized): Secondary | ICD-10-CM | POA: Diagnosis not present

## 2023-01-01 DIAGNOSIS — J9601 Acute respiratory failure with hypoxia: Secondary | ICD-10-CM | POA: Diagnosis not present

## 2023-01-01 DIAGNOSIS — I480 Paroxysmal atrial fibrillation: Secondary | ICD-10-CM | POA: Diagnosis not present

## 2023-01-01 DIAGNOSIS — I2510 Atherosclerotic heart disease of native coronary artery without angina pectoris: Secondary | ICD-10-CM | POA: Diagnosis not present

## 2023-01-02 DIAGNOSIS — I2510 Atherosclerotic heart disease of native coronary artery without angina pectoris: Secondary | ICD-10-CM | POA: Diagnosis not present

## 2023-01-02 DIAGNOSIS — M810 Age-related osteoporosis without current pathological fracture: Secondary | ICD-10-CM | POA: Diagnosis not present

## 2023-01-02 DIAGNOSIS — J9601 Acute respiratory failure with hypoxia: Secondary | ICD-10-CM | POA: Diagnosis not present

## 2023-01-02 DIAGNOSIS — D529 Folate deficiency anemia, unspecified: Secondary | ICD-10-CM | POA: Diagnosis not present

## 2023-01-02 DIAGNOSIS — G20C Parkinsonism, unspecified: Secondary | ICD-10-CM | POA: Diagnosis not present

## 2023-01-02 DIAGNOSIS — M6281 Muscle weakness (generalized): Secondary | ICD-10-CM | POA: Diagnosis not present

## 2023-01-02 DIAGNOSIS — E11649 Type 2 diabetes mellitus with hypoglycemia without coma: Secondary | ICD-10-CM | POA: Diagnosis not present

## 2023-01-02 DIAGNOSIS — I480 Paroxysmal atrial fibrillation: Secondary | ICD-10-CM | POA: Diagnosis not present

## 2023-01-12 DIAGNOSIS — E1142 Type 2 diabetes mellitus with diabetic polyneuropathy: Secondary | ICD-10-CM | POA: Diagnosis not present

## 2023-01-12 DIAGNOSIS — G20B2 Parkinson's disease with dyskinesia, with fluctuations: Secondary | ICD-10-CM | POA: Diagnosis not present

## 2023-01-12 DIAGNOSIS — E1143 Type 2 diabetes mellitus with diabetic autonomic (poly)neuropathy: Secondary | ICD-10-CM | POA: Diagnosis not present

## 2023-01-12 DIAGNOSIS — I480 Paroxysmal atrial fibrillation: Secondary | ICD-10-CM | POA: Diagnosis not present

## 2023-01-12 DIAGNOSIS — I10 Essential (primary) hypertension: Secondary | ICD-10-CM | POA: Diagnosis not present

## 2023-01-21 DIAGNOSIS — I480 Paroxysmal atrial fibrillation: Secondary | ICD-10-CM | POA: Diagnosis not present

## 2023-01-21 DIAGNOSIS — I10 Essential (primary) hypertension: Secondary | ICD-10-CM | POA: Diagnosis not present

## 2023-01-21 DIAGNOSIS — F064 Anxiety disorder due to known physiological condition: Secondary | ICD-10-CM | POA: Diagnosis not present

## 2023-01-21 DIAGNOSIS — F5101 Primary insomnia: Secondary | ICD-10-CM | POA: Diagnosis not present

## 2023-01-21 DIAGNOSIS — D508 Other iron deficiency anemias: Secondary | ICD-10-CM | POA: Diagnosis not present

## 2023-01-21 DIAGNOSIS — E119 Type 2 diabetes mellitus without complications: Secondary | ICD-10-CM | POA: Diagnosis not present

## 2023-02-04 DIAGNOSIS — M1A9XX0 Chronic gout, unspecified, without tophus (tophi): Secondary | ICD-10-CM | POA: Diagnosis not present

## 2023-02-04 DIAGNOSIS — E1142 Type 2 diabetes mellitus with diabetic polyneuropathy: Secondary | ICD-10-CM | POA: Diagnosis not present

## 2023-02-04 DIAGNOSIS — I480 Paroxysmal atrial fibrillation: Secondary | ICD-10-CM | POA: Diagnosis not present

## 2023-02-04 DIAGNOSIS — G20B2 Parkinson's disease with dyskinesia, with fluctuations: Secondary | ICD-10-CM | POA: Diagnosis not present

## 2023-02-04 DIAGNOSIS — I10 Essential (primary) hypertension: Secondary | ICD-10-CM | POA: Diagnosis not present

## 2023-02-09 DIAGNOSIS — E1142 Type 2 diabetes mellitus with diabetic polyneuropathy: Secondary | ICD-10-CM | POA: Diagnosis not present

## 2023-02-09 DIAGNOSIS — I480 Paroxysmal atrial fibrillation: Secondary | ICD-10-CM | POA: Diagnosis not present

## 2023-02-09 DIAGNOSIS — I10 Essential (primary) hypertension: Secondary | ICD-10-CM | POA: Diagnosis not present

## 2023-02-09 DIAGNOSIS — R5381 Other malaise: Secondary | ICD-10-CM | POA: Diagnosis not present

## 2023-02-17 DIAGNOSIS — D508 Other iron deficiency anemias: Secondary | ICD-10-CM | POA: Diagnosis not present

## 2023-02-17 DIAGNOSIS — I480 Paroxysmal atrial fibrillation: Secondary | ICD-10-CM | POA: Diagnosis not present

## 2023-02-17 DIAGNOSIS — E119 Type 2 diabetes mellitus without complications: Secondary | ICD-10-CM | POA: Diagnosis not present

## 2023-02-17 DIAGNOSIS — I10 Essential (primary) hypertension: Secondary | ICD-10-CM | POA: Diagnosis not present

## 2023-02-18 DIAGNOSIS — F5101 Primary insomnia: Secondary | ICD-10-CM | POA: Diagnosis not present

## 2023-02-18 DIAGNOSIS — F064 Anxiety disorder due to known physiological condition: Secondary | ICD-10-CM | POA: Diagnosis not present

## 2023-03-03 DIAGNOSIS — E1142 Type 2 diabetes mellitus with diabetic polyneuropathy: Secondary | ICD-10-CM | POA: Diagnosis not present

## 2023-03-03 DIAGNOSIS — I480 Paroxysmal atrial fibrillation: Secondary | ICD-10-CM | POA: Diagnosis not present

## 2023-03-03 DIAGNOSIS — R5381 Other malaise: Secondary | ICD-10-CM | POA: Diagnosis not present

## 2023-03-03 DIAGNOSIS — I10 Essential (primary) hypertension: Secondary | ICD-10-CM | POA: Diagnosis not present

## 2023-03-04 DIAGNOSIS — F5101 Primary insomnia: Secondary | ICD-10-CM | POA: Diagnosis not present

## 2023-03-04 DIAGNOSIS — F064 Anxiety disorder due to known physiological condition: Secondary | ICD-10-CM | POA: Diagnosis not present

## 2023-03-12 DIAGNOSIS — I480 Paroxysmal atrial fibrillation: Secondary | ICD-10-CM | POA: Diagnosis not present

## 2023-03-12 DIAGNOSIS — I10 Essential (primary) hypertension: Secondary | ICD-10-CM | POA: Diagnosis not present

## 2023-03-12 DIAGNOSIS — E119 Type 2 diabetes mellitus without complications: Secondary | ICD-10-CM | POA: Diagnosis not present

## 2023-03-12 DIAGNOSIS — R5381 Other malaise: Secondary | ICD-10-CM | POA: Diagnosis not present

## 2023-03-30 DIAGNOSIS — G20C Parkinsonism, unspecified: Secondary | ICD-10-CM | POA: Diagnosis not present

## 2023-03-30 DIAGNOSIS — E11649 Type 2 diabetes mellitus with hypoglycemia without coma: Secondary | ICD-10-CM | POA: Diagnosis not present

## 2023-03-30 DIAGNOSIS — I2510 Atherosclerotic heart disease of native coronary artery without angina pectoris: Secondary | ICD-10-CM | POA: Diagnosis not present

## 2023-03-30 DIAGNOSIS — J9601 Acute respiratory failure with hypoxia: Secondary | ICD-10-CM | POA: Diagnosis not present

## 2023-03-30 DIAGNOSIS — M6281 Muscle weakness (generalized): Secondary | ICD-10-CM | POA: Diagnosis not present

## 2023-03-30 DIAGNOSIS — R2681 Unsteadiness on feet: Secondary | ICD-10-CM | POA: Diagnosis not present

## 2023-03-30 DIAGNOSIS — R1310 Dysphagia, unspecified: Secondary | ICD-10-CM | POA: Diagnosis not present

## 2023-03-30 DIAGNOSIS — I480 Paroxysmal atrial fibrillation: Secondary | ICD-10-CM | POA: Diagnosis not present

## 2023-04-01 DIAGNOSIS — I2510 Atherosclerotic heart disease of native coronary artery without angina pectoris: Secondary | ICD-10-CM | POA: Diagnosis not present

## 2023-04-01 DIAGNOSIS — E11649 Type 2 diabetes mellitus with hypoglycemia without coma: Secondary | ICD-10-CM | POA: Diagnosis not present

## 2023-04-01 DIAGNOSIS — R2681 Unsteadiness on feet: Secondary | ICD-10-CM | POA: Diagnosis not present

## 2023-04-01 DIAGNOSIS — F5101 Primary insomnia: Secondary | ICD-10-CM | POA: Diagnosis not present

## 2023-04-01 DIAGNOSIS — J9601 Acute respiratory failure with hypoxia: Secondary | ICD-10-CM | POA: Diagnosis not present

## 2023-04-01 DIAGNOSIS — F064 Anxiety disorder due to known physiological condition: Secondary | ICD-10-CM | POA: Diagnosis not present

## 2023-04-01 DIAGNOSIS — R1310 Dysphagia, unspecified: Secondary | ICD-10-CM | POA: Diagnosis not present

## 2023-04-01 DIAGNOSIS — G20C Parkinsonism, unspecified: Secondary | ICD-10-CM | POA: Diagnosis not present

## 2023-04-01 DIAGNOSIS — I480 Paroxysmal atrial fibrillation: Secondary | ICD-10-CM | POA: Diagnosis not present

## 2023-04-01 DIAGNOSIS — M6281 Muscle weakness (generalized): Secondary | ICD-10-CM | POA: Diagnosis not present

## 2023-04-02 DIAGNOSIS — R1310 Dysphagia, unspecified: Secondary | ICD-10-CM | POA: Diagnosis not present

## 2023-04-02 DIAGNOSIS — I2510 Atherosclerotic heart disease of native coronary artery without angina pectoris: Secondary | ICD-10-CM | POA: Diagnosis not present

## 2023-04-02 DIAGNOSIS — J9601 Acute respiratory failure with hypoxia: Secondary | ICD-10-CM | POA: Diagnosis not present

## 2023-04-02 DIAGNOSIS — G20C Parkinsonism, unspecified: Secondary | ICD-10-CM | POA: Diagnosis not present

## 2023-04-02 DIAGNOSIS — R2681 Unsteadiness on feet: Secondary | ICD-10-CM | POA: Diagnosis not present

## 2023-04-02 DIAGNOSIS — I10 Essential (primary) hypertension: Secondary | ICD-10-CM | POA: Diagnosis not present

## 2023-04-02 DIAGNOSIS — E11649 Type 2 diabetes mellitus with hypoglycemia without coma: Secondary | ICD-10-CM | POA: Diagnosis not present

## 2023-04-02 DIAGNOSIS — I480 Paroxysmal atrial fibrillation: Secondary | ICD-10-CM | POA: Diagnosis not present

## 2023-04-02 DIAGNOSIS — E119 Type 2 diabetes mellitus without complications: Secondary | ICD-10-CM | POA: Diagnosis not present

## 2023-04-02 DIAGNOSIS — D508 Other iron deficiency anemias: Secondary | ICD-10-CM | POA: Diagnosis not present

## 2023-04-02 DIAGNOSIS — M6281 Muscle weakness (generalized): Secondary | ICD-10-CM | POA: Diagnosis not present

## 2023-04-06 DIAGNOSIS — R1310 Dysphagia, unspecified: Secondary | ICD-10-CM | POA: Diagnosis not present

## 2023-04-06 DIAGNOSIS — I2510 Atherosclerotic heart disease of native coronary artery without angina pectoris: Secondary | ICD-10-CM | POA: Diagnosis not present

## 2023-04-06 DIAGNOSIS — J9601 Acute respiratory failure with hypoxia: Secondary | ICD-10-CM | POA: Diagnosis not present

## 2023-04-06 DIAGNOSIS — E11649 Type 2 diabetes mellitus with hypoglycemia without coma: Secondary | ICD-10-CM | POA: Diagnosis not present

## 2023-04-06 DIAGNOSIS — G20C Parkinsonism, unspecified: Secondary | ICD-10-CM | POA: Diagnosis not present

## 2023-04-06 DIAGNOSIS — R2681 Unsteadiness on feet: Secondary | ICD-10-CM | POA: Diagnosis not present

## 2023-04-06 DIAGNOSIS — M6281 Muscle weakness (generalized): Secondary | ICD-10-CM | POA: Diagnosis not present

## 2023-04-06 DIAGNOSIS — I480 Paroxysmal atrial fibrillation: Secondary | ICD-10-CM | POA: Diagnosis not present

## 2023-04-07 DIAGNOSIS — R1310 Dysphagia, unspecified: Secondary | ICD-10-CM | POA: Diagnosis not present

## 2023-04-07 DIAGNOSIS — M1A9XX0 Chronic gout, unspecified, without tophus (tophi): Secondary | ICD-10-CM | POA: Diagnosis not present

## 2023-04-07 DIAGNOSIS — M6281 Muscle weakness (generalized): Secondary | ICD-10-CM | POA: Diagnosis not present

## 2023-04-07 DIAGNOSIS — I10 Essential (primary) hypertension: Secondary | ICD-10-CM | POA: Diagnosis not present

## 2023-04-07 DIAGNOSIS — I2510 Atherosclerotic heart disease of native coronary artery without angina pectoris: Secondary | ICD-10-CM | POA: Diagnosis not present

## 2023-04-07 DIAGNOSIS — E11649 Type 2 diabetes mellitus with hypoglycemia without coma: Secondary | ICD-10-CM | POA: Diagnosis not present

## 2023-04-07 DIAGNOSIS — E119 Type 2 diabetes mellitus without complications: Secondary | ICD-10-CM | POA: Diagnosis not present

## 2023-04-07 DIAGNOSIS — I480 Paroxysmal atrial fibrillation: Secondary | ICD-10-CM | POA: Diagnosis not present

## 2023-04-07 DIAGNOSIS — G20C Parkinsonism, unspecified: Secondary | ICD-10-CM | POA: Diagnosis not present

## 2023-04-07 DIAGNOSIS — R2681 Unsteadiness on feet: Secondary | ICD-10-CM | POA: Diagnosis not present

## 2023-04-07 DIAGNOSIS — J9601 Acute respiratory failure with hypoxia: Secondary | ICD-10-CM | POA: Diagnosis not present

## 2023-04-08 DIAGNOSIS — M6281 Muscle weakness (generalized): Secondary | ICD-10-CM | POA: Diagnosis not present

## 2023-04-08 DIAGNOSIS — R1310 Dysphagia, unspecified: Secondary | ICD-10-CM | POA: Diagnosis not present

## 2023-04-08 DIAGNOSIS — G20C Parkinsonism, unspecified: Secondary | ICD-10-CM | POA: Diagnosis not present

## 2023-04-08 DIAGNOSIS — R2681 Unsteadiness on feet: Secondary | ICD-10-CM | POA: Diagnosis not present

## 2023-04-08 DIAGNOSIS — I2510 Atherosclerotic heart disease of native coronary artery without angina pectoris: Secondary | ICD-10-CM | POA: Diagnosis not present

## 2023-04-08 DIAGNOSIS — I480 Paroxysmal atrial fibrillation: Secondary | ICD-10-CM | POA: Diagnosis not present

## 2023-04-08 DIAGNOSIS — J9601 Acute respiratory failure with hypoxia: Secondary | ICD-10-CM | POA: Diagnosis not present

## 2023-04-08 DIAGNOSIS — E11649 Type 2 diabetes mellitus with hypoglycemia without coma: Secondary | ICD-10-CM | POA: Diagnosis not present

## 2023-04-09 DIAGNOSIS — J9601 Acute respiratory failure with hypoxia: Secondary | ICD-10-CM | POA: Diagnosis not present

## 2023-04-09 DIAGNOSIS — I480 Paroxysmal atrial fibrillation: Secondary | ICD-10-CM | POA: Diagnosis not present

## 2023-04-09 DIAGNOSIS — M6281 Muscle weakness (generalized): Secondary | ICD-10-CM | POA: Diagnosis not present

## 2023-04-09 DIAGNOSIS — R2681 Unsteadiness on feet: Secondary | ICD-10-CM | POA: Diagnosis not present

## 2023-04-09 DIAGNOSIS — G20C Parkinsonism, unspecified: Secondary | ICD-10-CM | POA: Diagnosis not present

## 2023-04-09 DIAGNOSIS — I2510 Atherosclerotic heart disease of native coronary artery without angina pectoris: Secondary | ICD-10-CM | POA: Diagnosis not present

## 2023-04-09 DIAGNOSIS — R1310 Dysphagia, unspecified: Secondary | ICD-10-CM | POA: Diagnosis not present

## 2023-04-09 DIAGNOSIS — E11649 Type 2 diabetes mellitus with hypoglycemia without coma: Secondary | ICD-10-CM | POA: Diagnosis not present

## 2023-04-14 DIAGNOSIS — M6281 Muscle weakness (generalized): Secondary | ICD-10-CM | POA: Diagnosis not present

## 2023-04-14 DIAGNOSIS — I480 Paroxysmal atrial fibrillation: Secondary | ICD-10-CM | POA: Diagnosis not present

## 2023-04-14 DIAGNOSIS — E11649 Type 2 diabetes mellitus with hypoglycemia without coma: Secondary | ICD-10-CM | POA: Diagnosis not present

## 2023-04-14 DIAGNOSIS — J9601 Acute respiratory failure with hypoxia: Secondary | ICD-10-CM | POA: Diagnosis not present

## 2023-04-14 DIAGNOSIS — R2681 Unsteadiness on feet: Secondary | ICD-10-CM | POA: Diagnosis not present

## 2023-04-14 DIAGNOSIS — D529 Folate deficiency anemia, unspecified: Secondary | ICD-10-CM | POA: Diagnosis not present

## 2023-04-14 DIAGNOSIS — G20C Parkinsonism, unspecified: Secondary | ICD-10-CM | POA: Diagnosis not present

## 2023-04-15 DIAGNOSIS — I480 Paroxysmal atrial fibrillation: Secondary | ICD-10-CM | POA: Diagnosis not present

## 2023-04-15 DIAGNOSIS — J9601 Acute respiratory failure with hypoxia: Secondary | ICD-10-CM | POA: Diagnosis not present

## 2023-04-15 DIAGNOSIS — M6281 Muscle weakness (generalized): Secondary | ICD-10-CM | POA: Diagnosis not present

## 2023-04-15 DIAGNOSIS — D529 Folate deficiency anemia, unspecified: Secondary | ICD-10-CM | POA: Diagnosis not present

## 2023-04-15 DIAGNOSIS — E11649 Type 2 diabetes mellitus with hypoglycemia without coma: Secondary | ICD-10-CM | POA: Diagnosis not present

## 2023-04-15 DIAGNOSIS — G20C Parkinsonism, unspecified: Secondary | ICD-10-CM | POA: Diagnosis not present

## 2023-04-15 DIAGNOSIS — R2681 Unsteadiness on feet: Secondary | ICD-10-CM | POA: Diagnosis not present

## 2023-04-17 DIAGNOSIS — I480 Paroxysmal atrial fibrillation: Secondary | ICD-10-CM | POA: Diagnosis not present

## 2023-04-17 DIAGNOSIS — G20C Parkinsonism, unspecified: Secondary | ICD-10-CM | POA: Diagnosis not present

## 2023-04-17 DIAGNOSIS — R2681 Unsteadiness on feet: Secondary | ICD-10-CM | POA: Diagnosis not present

## 2023-04-17 DIAGNOSIS — M6281 Muscle weakness (generalized): Secondary | ICD-10-CM | POA: Diagnosis not present

## 2023-04-17 DIAGNOSIS — J9601 Acute respiratory failure with hypoxia: Secondary | ICD-10-CM | POA: Diagnosis not present

## 2023-04-17 DIAGNOSIS — E11649 Type 2 diabetes mellitus with hypoglycemia without coma: Secondary | ICD-10-CM | POA: Diagnosis not present

## 2023-04-17 DIAGNOSIS — D529 Folate deficiency anemia, unspecified: Secondary | ICD-10-CM | POA: Diagnosis not present

## 2023-04-20 DIAGNOSIS — G20C Parkinsonism, unspecified: Secondary | ICD-10-CM | POA: Diagnosis not present

## 2023-04-20 DIAGNOSIS — M6281 Muscle weakness (generalized): Secondary | ICD-10-CM | POA: Diagnosis not present

## 2023-04-20 DIAGNOSIS — D529 Folate deficiency anemia, unspecified: Secondary | ICD-10-CM | POA: Diagnosis not present

## 2023-04-20 DIAGNOSIS — I480 Paroxysmal atrial fibrillation: Secondary | ICD-10-CM | POA: Diagnosis not present

## 2023-04-20 DIAGNOSIS — E11649 Type 2 diabetes mellitus with hypoglycemia without coma: Secondary | ICD-10-CM | POA: Diagnosis not present

## 2023-04-20 DIAGNOSIS — R2681 Unsteadiness on feet: Secondary | ICD-10-CM | POA: Diagnosis not present

## 2023-04-20 DIAGNOSIS — J9601 Acute respiratory failure with hypoxia: Secondary | ICD-10-CM | POA: Diagnosis not present

## 2023-04-21 DIAGNOSIS — R2681 Unsteadiness on feet: Secondary | ICD-10-CM | POA: Diagnosis not present

## 2023-04-21 DIAGNOSIS — M6281 Muscle weakness (generalized): Secondary | ICD-10-CM | POA: Diagnosis not present

## 2023-04-21 DIAGNOSIS — D529 Folate deficiency anemia, unspecified: Secondary | ICD-10-CM | POA: Diagnosis not present

## 2023-04-21 DIAGNOSIS — G20C Parkinsonism, unspecified: Secondary | ICD-10-CM | POA: Diagnosis not present

## 2023-04-21 DIAGNOSIS — I480 Paroxysmal atrial fibrillation: Secondary | ICD-10-CM | POA: Diagnosis not present

## 2023-04-21 DIAGNOSIS — E11649 Type 2 diabetes mellitus with hypoglycemia without coma: Secondary | ICD-10-CM | POA: Diagnosis not present

## 2023-04-21 DIAGNOSIS — J9601 Acute respiratory failure with hypoxia: Secondary | ICD-10-CM | POA: Diagnosis not present

## 2023-04-22 DIAGNOSIS — G20C Parkinsonism, unspecified: Secondary | ICD-10-CM | POA: Diagnosis not present

## 2023-04-22 DIAGNOSIS — M6281 Muscle weakness (generalized): Secondary | ICD-10-CM | POA: Diagnosis not present

## 2023-04-22 DIAGNOSIS — J9601 Acute respiratory failure with hypoxia: Secondary | ICD-10-CM | POA: Diagnosis not present

## 2023-04-22 DIAGNOSIS — D529 Folate deficiency anemia, unspecified: Secondary | ICD-10-CM | POA: Diagnosis not present

## 2023-04-22 DIAGNOSIS — E11649 Type 2 diabetes mellitus with hypoglycemia without coma: Secondary | ICD-10-CM | POA: Diagnosis not present

## 2023-04-22 DIAGNOSIS — R2681 Unsteadiness on feet: Secondary | ICD-10-CM | POA: Diagnosis not present

## 2023-04-22 DIAGNOSIS — I480 Paroxysmal atrial fibrillation: Secondary | ICD-10-CM | POA: Diagnosis not present

## 2023-04-24 DIAGNOSIS — I10 Essential (primary) hypertension: Secondary | ICD-10-CM | POA: Diagnosis not present

## 2023-04-24 DIAGNOSIS — D508 Other iron deficiency anemias: Secondary | ICD-10-CM | POA: Diagnosis not present

## 2023-04-24 DIAGNOSIS — I480 Paroxysmal atrial fibrillation: Secondary | ICD-10-CM | POA: Diagnosis not present

## 2023-04-24 DIAGNOSIS — E119 Type 2 diabetes mellitus without complications: Secondary | ICD-10-CM | POA: Diagnosis not present

## 2023-04-27 DIAGNOSIS — J9601 Acute respiratory failure with hypoxia: Secondary | ICD-10-CM | POA: Diagnosis not present

## 2023-04-27 DIAGNOSIS — I480 Paroxysmal atrial fibrillation: Secondary | ICD-10-CM | POA: Diagnosis not present

## 2023-04-27 DIAGNOSIS — D529 Folate deficiency anemia, unspecified: Secondary | ICD-10-CM | POA: Diagnosis not present

## 2023-04-27 DIAGNOSIS — R2681 Unsteadiness on feet: Secondary | ICD-10-CM | POA: Diagnosis not present

## 2023-04-27 DIAGNOSIS — E11649 Type 2 diabetes mellitus with hypoglycemia without coma: Secondary | ICD-10-CM | POA: Diagnosis not present

## 2023-04-27 DIAGNOSIS — G20C Parkinsonism, unspecified: Secondary | ICD-10-CM | POA: Diagnosis not present

## 2023-04-27 DIAGNOSIS — M6281 Muscle weakness (generalized): Secondary | ICD-10-CM | POA: Diagnosis not present

## 2023-04-29 DIAGNOSIS — F5101 Primary insomnia: Secondary | ICD-10-CM | POA: Diagnosis not present

## 2023-04-29 DIAGNOSIS — F064 Anxiety disorder due to known physiological condition: Secondary | ICD-10-CM | POA: Diagnosis not present

## 2023-05-05 DIAGNOSIS — R5381 Other malaise: Secondary | ICD-10-CM | POA: Diagnosis not present

## 2023-05-05 DIAGNOSIS — I10 Essential (primary) hypertension: Secondary | ICD-10-CM | POA: Diagnosis not present

## 2023-05-05 DIAGNOSIS — E119 Type 2 diabetes mellitus without complications: Secondary | ICD-10-CM | POA: Diagnosis not present

## 2023-05-05 DIAGNOSIS — I480 Paroxysmal atrial fibrillation: Secondary | ICD-10-CM | POA: Diagnosis not present

## 2023-05-11 DIAGNOSIS — J9601 Acute respiratory failure with hypoxia: Secondary | ICD-10-CM | POA: Diagnosis not present

## 2023-05-11 DIAGNOSIS — D529 Folate deficiency anemia, unspecified: Secondary | ICD-10-CM | POA: Diagnosis not present

## 2023-05-11 DIAGNOSIS — M6281 Muscle weakness (generalized): Secondary | ICD-10-CM | POA: Diagnosis not present

## 2023-05-11 DIAGNOSIS — E11649 Type 2 diabetes mellitus with hypoglycemia without coma: Secondary | ICD-10-CM | POA: Diagnosis not present

## 2023-05-11 DIAGNOSIS — R2681 Unsteadiness on feet: Secondary | ICD-10-CM | POA: Diagnosis not present

## 2023-05-11 DIAGNOSIS — I480 Paroxysmal atrial fibrillation: Secondary | ICD-10-CM | POA: Diagnosis not present

## 2023-05-11 DIAGNOSIS — G20C Parkinsonism, unspecified: Secondary | ICD-10-CM | POA: Diagnosis not present

## 2023-05-12 DIAGNOSIS — R638 Other symptoms and signs concerning food and fluid intake: Secondary | ICD-10-CM | POA: Diagnosis not present

## 2023-05-12 DIAGNOSIS — D529 Folate deficiency anemia, unspecified: Secondary | ICD-10-CM | POA: Diagnosis not present

## 2023-05-12 DIAGNOSIS — E11649 Type 2 diabetes mellitus with hypoglycemia without coma: Secondary | ICD-10-CM | POA: Diagnosis not present

## 2023-05-12 DIAGNOSIS — G20C Parkinsonism, unspecified: Secondary | ICD-10-CM | POA: Diagnosis not present

## 2023-05-12 DIAGNOSIS — R2681 Unsteadiness on feet: Secondary | ICD-10-CM | POA: Diagnosis not present

## 2023-05-12 DIAGNOSIS — R1310 Dysphagia, unspecified: Secondary | ICD-10-CM | POA: Diagnosis not present

## 2023-05-12 DIAGNOSIS — M6281 Muscle weakness (generalized): Secondary | ICD-10-CM | POA: Diagnosis not present

## 2023-05-12 DIAGNOSIS — J9601 Acute respiratory failure with hypoxia: Secondary | ICD-10-CM | POA: Diagnosis not present

## 2023-05-12 DIAGNOSIS — I480 Paroxysmal atrial fibrillation: Secondary | ICD-10-CM | POA: Diagnosis not present

## 2023-05-12 DIAGNOSIS — R059 Cough, unspecified: Secondary | ICD-10-CM | POA: Diagnosis not present

## 2023-05-13 DIAGNOSIS — M6281 Muscle weakness (generalized): Secondary | ICD-10-CM | POA: Diagnosis not present

## 2023-05-13 DIAGNOSIS — R2681 Unsteadiness on feet: Secondary | ICD-10-CM | POA: Diagnosis not present

## 2023-05-13 DIAGNOSIS — D529 Folate deficiency anemia, unspecified: Secondary | ICD-10-CM | POA: Diagnosis not present

## 2023-05-13 DIAGNOSIS — G20C Parkinsonism, unspecified: Secondary | ICD-10-CM | POA: Diagnosis not present

## 2023-05-13 DIAGNOSIS — E11649 Type 2 diabetes mellitus with hypoglycemia without coma: Secondary | ICD-10-CM | POA: Diagnosis not present

## 2023-05-13 DIAGNOSIS — J9601 Acute respiratory failure with hypoxia: Secondary | ICD-10-CM | POA: Diagnosis not present

## 2023-05-13 DIAGNOSIS — I480 Paroxysmal atrial fibrillation: Secondary | ICD-10-CM | POA: Diagnosis not present

## 2023-05-14 DIAGNOSIS — E11649 Type 2 diabetes mellitus with hypoglycemia without coma: Secondary | ICD-10-CM | POA: Diagnosis not present

## 2023-05-14 DIAGNOSIS — J9601 Acute respiratory failure with hypoxia: Secondary | ICD-10-CM | POA: Diagnosis not present

## 2023-05-14 DIAGNOSIS — D529 Folate deficiency anemia, unspecified: Secondary | ICD-10-CM | POA: Diagnosis not present

## 2023-05-14 DIAGNOSIS — G20C Parkinsonism, unspecified: Secondary | ICD-10-CM | POA: Diagnosis not present

## 2023-05-14 DIAGNOSIS — I480 Paroxysmal atrial fibrillation: Secondary | ICD-10-CM | POA: Diagnosis not present

## 2023-05-14 DIAGNOSIS — M6281 Muscle weakness (generalized): Secondary | ICD-10-CM | POA: Diagnosis not present

## 2023-05-14 DIAGNOSIS — I2510 Atherosclerotic heart disease of native coronary artery without angina pectoris: Secondary | ICD-10-CM | POA: Diagnosis not present

## 2023-05-14 DIAGNOSIS — R1310 Dysphagia, unspecified: Secondary | ICD-10-CM | POA: Diagnosis not present

## 2023-05-14 DIAGNOSIS — D62 Acute posthemorrhagic anemia: Secondary | ICD-10-CM | POA: Diagnosis not present

## 2023-05-15 DIAGNOSIS — D62 Acute posthemorrhagic anemia: Secondary | ICD-10-CM | POA: Diagnosis not present

## 2023-05-15 DIAGNOSIS — I480 Paroxysmal atrial fibrillation: Secondary | ICD-10-CM | POA: Diagnosis not present

## 2023-05-15 DIAGNOSIS — M6281 Muscle weakness (generalized): Secondary | ICD-10-CM | POA: Diagnosis not present

## 2023-05-15 DIAGNOSIS — J9601 Acute respiratory failure with hypoxia: Secondary | ICD-10-CM | POA: Diagnosis not present

## 2023-05-15 DIAGNOSIS — G20C Parkinsonism, unspecified: Secondary | ICD-10-CM | POA: Diagnosis not present

## 2023-05-15 DIAGNOSIS — E11649 Type 2 diabetes mellitus with hypoglycemia without coma: Secondary | ICD-10-CM | POA: Diagnosis not present

## 2023-05-15 DIAGNOSIS — I2510 Atherosclerotic heart disease of native coronary artery without angina pectoris: Secondary | ICD-10-CM | POA: Diagnosis not present

## 2023-05-15 DIAGNOSIS — D529 Folate deficiency anemia, unspecified: Secondary | ICD-10-CM | POA: Diagnosis not present

## 2023-05-15 DIAGNOSIS — R1310 Dysphagia, unspecified: Secondary | ICD-10-CM | POA: Diagnosis not present

## 2023-05-18 DIAGNOSIS — J9601 Acute respiratory failure with hypoxia: Secondary | ICD-10-CM | POA: Diagnosis not present

## 2023-05-18 DIAGNOSIS — D62 Acute posthemorrhagic anemia: Secondary | ICD-10-CM | POA: Diagnosis not present

## 2023-05-18 DIAGNOSIS — R1310 Dysphagia, unspecified: Secondary | ICD-10-CM | POA: Diagnosis not present

## 2023-05-18 DIAGNOSIS — M6281 Muscle weakness (generalized): Secondary | ICD-10-CM | POA: Diagnosis not present

## 2023-05-18 DIAGNOSIS — E11649 Type 2 diabetes mellitus with hypoglycemia without coma: Secondary | ICD-10-CM | POA: Diagnosis not present

## 2023-05-18 DIAGNOSIS — I480 Paroxysmal atrial fibrillation: Secondary | ICD-10-CM | POA: Diagnosis not present

## 2023-05-18 DIAGNOSIS — D529 Folate deficiency anemia, unspecified: Secondary | ICD-10-CM | POA: Diagnosis not present

## 2023-05-18 DIAGNOSIS — G20C Parkinsonism, unspecified: Secondary | ICD-10-CM | POA: Diagnosis not present

## 2023-05-18 DIAGNOSIS — I2510 Atherosclerotic heart disease of native coronary artery without angina pectoris: Secondary | ICD-10-CM | POA: Diagnosis not present

## 2023-05-25 DIAGNOSIS — J9601 Acute respiratory failure with hypoxia: Secondary | ICD-10-CM | POA: Diagnosis not present

## 2023-05-25 DIAGNOSIS — G20C Parkinsonism, unspecified: Secondary | ICD-10-CM | POA: Diagnosis not present

## 2023-05-25 DIAGNOSIS — D529 Folate deficiency anemia, unspecified: Secondary | ICD-10-CM | POA: Diagnosis not present

## 2023-05-25 DIAGNOSIS — M6281 Muscle weakness (generalized): Secondary | ICD-10-CM | POA: Diagnosis not present

## 2023-05-25 DIAGNOSIS — I480 Paroxysmal atrial fibrillation: Secondary | ICD-10-CM | POA: Diagnosis not present

## 2023-05-25 DIAGNOSIS — R1310 Dysphagia, unspecified: Secondary | ICD-10-CM | POA: Diagnosis not present

## 2023-05-25 DIAGNOSIS — E11649 Type 2 diabetes mellitus with hypoglycemia without coma: Secondary | ICD-10-CM | POA: Diagnosis not present

## 2023-05-25 DIAGNOSIS — I2510 Atherosclerotic heart disease of native coronary artery without angina pectoris: Secondary | ICD-10-CM | POA: Diagnosis not present

## 2023-05-25 DIAGNOSIS — D62 Acute posthemorrhagic anemia: Secondary | ICD-10-CM | POA: Diagnosis not present

## 2023-05-26 DIAGNOSIS — I2510 Atherosclerotic heart disease of native coronary artery without angina pectoris: Secondary | ICD-10-CM | POA: Diagnosis not present

## 2023-05-26 DIAGNOSIS — R1310 Dysphagia, unspecified: Secondary | ICD-10-CM | POA: Diagnosis not present

## 2023-05-26 DIAGNOSIS — J9601 Acute respiratory failure with hypoxia: Secondary | ICD-10-CM | POA: Diagnosis not present

## 2023-05-26 DIAGNOSIS — I480 Paroxysmal atrial fibrillation: Secondary | ICD-10-CM | POA: Diagnosis not present

## 2023-05-26 DIAGNOSIS — E11649 Type 2 diabetes mellitus with hypoglycemia without coma: Secondary | ICD-10-CM | POA: Diagnosis not present

## 2023-05-26 DIAGNOSIS — D62 Acute posthemorrhagic anemia: Secondary | ICD-10-CM | POA: Diagnosis not present

## 2023-05-26 DIAGNOSIS — D529 Folate deficiency anemia, unspecified: Secondary | ICD-10-CM | POA: Diagnosis not present

## 2023-05-26 DIAGNOSIS — G20C Parkinsonism, unspecified: Secondary | ICD-10-CM | POA: Diagnosis not present

## 2023-05-26 DIAGNOSIS — M6281 Muscle weakness (generalized): Secondary | ICD-10-CM | POA: Diagnosis not present

## 2023-05-27 DIAGNOSIS — I480 Paroxysmal atrial fibrillation: Secondary | ICD-10-CM | POA: Diagnosis not present

## 2023-05-27 DIAGNOSIS — F064 Anxiety disorder due to known physiological condition: Secondary | ICD-10-CM | POA: Diagnosis not present

## 2023-05-27 DIAGNOSIS — E11649 Type 2 diabetes mellitus with hypoglycemia without coma: Secondary | ICD-10-CM | POA: Diagnosis not present

## 2023-05-27 DIAGNOSIS — R1310 Dysphagia, unspecified: Secondary | ICD-10-CM | POA: Diagnosis not present

## 2023-05-27 DIAGNOSIS — G20C Parkinsonism, unspecified: Secondary | ICD-10-CM | POA: Diagnosis not present

## 2023-05-27 DIAGNOSIS — J9601 Acute respiratory failure with hypoxia: Secondary | ICD-10-CM | POA: Diagnosis not present

## 2023-05-27 DIAGNOSIS — D529 Folate deficiency anemia, unspecified: Secondary | ICD-10-CM | POA: Diagnosis not present

## 2023-05-27 DIAGNOSIS — F5101 Primary insomnia: Secondary | ICD-10-CM | POA: Diagnosis not present

## 2023-05-27 DIAGNOSIS — I2510 Atherosclerotic heart disease of native coronary artery without angina pectoris: Secondary | ICD-10-CM | POA: Diagnosis not present

## 2023-05-27 DIAGNOSIS — M6281 Muscle weakness (generalized): Secondary | ICD-10-CM | POA: Diagnosis not present

## 2023-05-27 DIAGNOSIS — D62 Acute posthemorrhagic anemia: Secondary | ICD-10-CM | POA: Diagnosis not present

## 2023-05-28 DIAGNOSIS — D529 Folate deficiency anemia, unspecified: Secondary | ICD-10-CM | POA: Diagnosis not present

## 2023-05-28 DIAGNOSIS — D62 Acute posthemorrhagic anemia: Secondary | ICD-10-CM | POA: Diagnosis not present

## 2023-05-28 DIAGNOSIS — I2510 Atherosclerotic heart disease of native coronary artery without angina pectoris: Secondary | ICD-10-CM | POA: Diagnosis not present

## 2023-05-28 DIAGNOSIS — R1310 Dysphagia, unspecified: Secondary | ICD-10-CM | POA: Diagnosis not present

## 2023-05-28 DIAGNOSIS — M6281 Muscle weakness (generalized): Secondary | ICD-10-CM | POA: Diagnosis not present

## 2023-05-28 DIAGNOSIS — I480 Paroxysmal atrial fibrillation: Secondary | ICD-10-CM | POA: Diagnosis not present

## 2023-05-28 DIAGNOSIS — G20C Parkinsonism, unspecified: Secondary | ICD-10-CM | POA: Diagnosis not present

## 2023-05-28 DIAGNOSIS — J9601 Acute respiratory failure with hypoxia: Secondary | ICD-10-CM | POA: Diagnosis not present

## 2023-05-28 DIAGNOSIS — E11649 Type 2 diabetes mellitus with hypoglycemia without coma: Secondary | ICD-10-CM | POA: Diagnosis not present

## 2023-05-29 DIAGNOSIS — R1310 Dysphagia, unspecified: Secondary | ICD-10-CM | POA: Diagnosis not present

## 2023-05-29 DIAGNOSIS — J9601 Acute respiratory failure with hypoxia: Secondary | ICD-10-CM | POA: Diagnosis not present

## 2023-05-29 DIAGNOSIS — E11649 Type 2 diabetes mellitus with hypoglycemia without coma: Secondary | ICD-10-CM | POA: Diagnosis not present

## 2023-05-29 DIAGNOSIS — D529 Folate deficiency anemia, unspecified: Secondary | ICD-10-CM | POA: Diagnosis not present

## 2023-05-29 DIAGNOSIS — I2510 Atherosclerotic heart disease of native coronary artery without angina pectoris: Secondary | ICD-10-CM | POA: Diagnosis not present

## 2023-05-29 DIAGNOSIS — D62 Acute posthemorrhagic anemia: Secondary | ICD-10-CM | POA: Diagnosis not present

## 2023-05-29 DIAGNOSIS — M6281 Muscle weakness (generalized): Secondary | ICD-10-CM | POA: Diagnosis not present

## 2023-05-29 DIAGNOSIS — G20C Parkinsonism, unspecified: Secondary | ICD-10-CM | POA: Diagnosis not present

## 2023-05-29 DIAGNOSIS — I480 Paroxysmal atrial fibrillation: Secondary | ICD-10-CM | POA: Diagnosis not present

## 2023-06-01 DIAGNOSIS — D62 Acute posthemorrhagic anemia: Secondary | ICD-10-CM | POA: Diagnosis not present

## 2023-06-01 DIAGNOSIS — E11649 Type 2 diabetes mellitus with hypoglycemia without coma: Secondary | ICD-10-CM | POA: Diagnosis not present

## 2023-06-01 DIAGNOSIS — I480 Paroxysmal atrial fibrillation: Secondary | ICD-10-CM | POA: Diagnosis not present

## 2023-06-01 DIAGNOSIS — G20C Parkinsonism, unspecified: Secondary | ICD-10-CM | POA: Diagnosis not present

## 2023-06-01 DIAGNOSIS — D529 Folate deficiency anemia, unspecified: Secondary | ICD-10-CM | POA: Diagnosis not present

## 2023-06-01 DIAGNOSIS — R1310 Dysphagia, unspecified: Secondary | ICD-10-CM | POA: Diagnosis not present

## 2023-06-01 DIAGNOSIS — J9601 Acute respiratory failure with hypoxia: Secondary | ICD-10-CM | POA: Diagnosis not present

## 2023-06-01 DIAGNOSIS — M6281 Muscle weakness (generalized): Secondary | ICD-10-CM | POA: Diagnosis not present

## 2023-06-01 DIAGNOSIS — I2510 Atherosclerotic heart disease of native coronary artery without angina pectoris: Secondary | ICD-10-CM | POA: Diagnosis not present

## 2023-06-02 DIAGNOSIS — I480 Paroxysmal atrial fibrillation: Secondary | ICD-10-CM | POA: Diagnosis not present

## 2023-06-02 DIAGNOSIS — D62 Acute posthemorrhagic anemia: Secondary | ICD-10-CM | POA: Diagnosis not present

## 2023-06-02 DIAGNOSIS — G20C Parkinsonism, unspecified: Secondary | ICD-10-CM | POA: Diagnosis not present

## 2023-06-02 DIAGNOSIS — I2510 Atherosclerotic heart disease of native coronary artery without angina pectoris: Secondary | ICD-10-CM | POA: Diagnosis not present

## 2023-06-02 DIAGNOSIS — E11649 Type 2 diabetes mellitus with hypoglycemia without coma: Secondary | ICD-10-CM | POA: Diagnosis not present

## 2023-06-02 DIAGNOSIS — J9601 Acute respiratory failure with hypoxia: Secondary | ICD-10-CM | POA: Diagnosis not present

## 2023-06-02 DIAGNOSIS — M6281 Muscle weakness (generalized): Secondary | ICD-10-CM | POA: Diagnosis not present

## 2023-06-02 DIAGNOSIS — D529 Folate deficiency anemia, unspecified: Secondary | ICD-10-CM | POA: Diagnosis not present

## 2023-06-02 DIAGNOSIS — R1310 Dysphagia, unspecified: Secondary | ICD-10-CM | POA: Diagnosis not present

## 2023-06-03 DIAGNOSIS — R1310 Dysphagia, unspecified: Secondary | ICD-10-CM | POA: Diagnosis not present

## 2023-06-03 DIAGNOSIS — J9601 Acute respiratory failure with hypoxia: Secondary | ICD-10-CM | POA: Diagnosis not present

## 2023-06-03 DIAGNOSIS — M6281 Muscle weakness (generalized): Secondary | ICD-10-CM | POA: Diagnosis not present

## 2023-06-03 DIAGNOSIS — E11649 Type 2 diabetes mellitus with hypoglycemia without coma: Secondary | ICD-10-CM | POA: Diagnosis not present

## 2023-06-03 DIAGNOSIS — I2510 Atherosclerotic heart disease of native coronary artery without angina pectoris: Secondary | ICD-10-CM | POA: Diagnosis not present

## 2023-06-03 DIAGNOSIS — D529 Folate deficiency anemia, unspecified: Secondary | ICD-10-CM | POA: Diagnosis not present

## 2023-06-03 DIAGNOSIS — G20C Parkinsonism, unspecified: Secondary | ICD-10-CM | POA: Diagnosis not present

## 2023-06-03 DIAGNOSIS — I480 Paroxysmal atrial fibrillation: Secondary | ICD-10-CM | POA: Diagnosis not present

## 2023-06-03 DIAGNOSIS — D62 Acute posthemorrhagic anemia: Secondary | ICD-10-CM | POA: Diagnosis not present

## 2023-06-04 DIAGNOSIS — E11649 Type 2 diabetes mellitus with hypoglycemia without coma: Secondary | ICD-10-CM | POA: Diagnosis not present

## 2023-06-04 DIAGNOSIS — R1310 Dysphagia, unspecified: Secondary | ICD-10-CM | POA: Diagnosis not present

## 2023-06-04 DIAGNOSIS — D62 Acute posthemorrhagic anemia: Secondary | ICD-10-CM | POA: Diagnosis not present

## 2023-06-04 DIAGNOSIS — I480 Paroxysmal atrial fibrillation: Secondary | ICD-10-CM | POA: Diagnosis not present

## 2023-06-04 DIAGNOSIS — M6281 Muscle weakness (generalized): Secondary | ICD-10-CM | POA: Diagnosis not present

## 2023-06-04 DIAGNOSIS — I2510 Atherosclerotic heart disease of native coronary artery without angina pectoris: Secondary | ICD-10-CM | POA: Diagnosis not present

## 2023-06-04 DIAGNOSIS — D529 Folate deficiency anemia, unspecified: Secondary | ICD-10-CM | POA: Diagnosis not present

## 2023-06-04 DIAGNOSIS — G20C Parkinsonism, unspecified: Secondary | ICD-10-CM | POA: Diagnosis not present

## 2023-06-04 DIAGNOSIS — J9601 Acute respiratory failure with hypoxia: Secondary | ICD-10-CM | POA: Diagnosis not present

## 2023-06-05 DIAGNOSIS — M6281 Muscle weakness (generalized): Secondary | ICD-10-CM | POA: Diagnosis not present

## 2023-06-05 DIAGNOSIS — J9601 Acute respiratory failure with hypoxia: Secondary | ICD-10-CM | POA: Diagnosis not present

## 2023-06-05 DIAGNOSIS — G20C Parkinsonism, unspecified: Secondary | ICD-10-CM | POA: Diagnosis not present

## 2023-06-05 DIAGNOSIS — I480 Paroxysmal atrial fibrillation: Secondary | ICD-10-CM | POA: Diagnosis not present

## 2023-06-05 DIAGNOSIS — D529 Folate deficiency anemia, unspecified: Secondary | ICD-10-CM | POA: Diagnosis not present

## 2023-06-05 DIAGNOSIS — D62 Acute posthemorrhagic anemia: Secondary | ICD-10-CM | POA: Diagnosis not present

## 2023-06-05 DIAGNOSIS — E11649 Type 2 diabetes mellitus with hypoglycemia without coma: Secondary | ICD-10-CM | POA: Diagnosis not present

## 2023-06-05 DIAGNOSIS — R1310 Dysphagia, unspecified: Secondary | ICD-10-CM | POA: Diagnosis not present

## 2023-06-05 DIAGNOSIS — E1142 Type 2 diabetes mellitus with diabetic polyneuropathy: Secondary | ICD-10-CM | POA: Diagnosis not present

## 2023-06-05 DIAGNOSIS — I10 Essential (primary) hypertension: Secondary | ICD-10-CM | POA: Diagnosis not present

## 2023-06-05 DIAGNOSIS — E119 Type 2 diabetes mellitus without complications: Secondary | ICD-10-CM | POA: Diagnosis not present

## 2023-06-05 DIAGNOSIS — I2510 Atherosclerotic heart disease of native coronary artery without angina pectoris: Secondary | ICD-10-CM | POA: Diagnosis not present

## 2023-06-08 DIAGNOSIS — M6281 Muscle weakness (generalized): Secondary | ICD-10-CM | POA: Diagnosis not present

## 2023-06-08 DIAGNOSIS — J9601 Acute respiratory failure with hypoxia: Secondary | ICD-10-CM | POA: Diagnosis not present

## 2023-06-08 DIAGNOSIS — I480 Paroxysmal atrial fibrillation: Secondary | ICD-10-CM | POA: Diagnosis not present

## 2023-06-08 DIAGNOSIS — R1310 Dysphagia, unspecified: Secondary | ICD-10-CM | POA: Diagnosis not present

## 2023-06-08 DIAGNOSIS — E11649 Type 2 diabetes mellitus with hypoglycemia without coma: Secondary | ICD-10-CM | POA: Diagnosis not present

## 2023-06-08 DIAGNOSIS — I2510 Atherosclerotic heart disease of native coronary artery without angina pectoris: Secondary | ICD-10-CM | POA: Diagnosis not present

## 2023-06-08 DIAGNOSIS — D529 Folate deficiency anemia, unspecified: Secondary | ICD-10-CM | POA: Diagnosis not present

## 2023-06-08 DIAGNOSIS — D62 Acute posthemorrhagic anemia: Secondary | ICD-10-CM | POA: Diagnosis not present

## 2023-06-08 DIAGNOSIS — G20C Parkinsonism, unspecified: Secondary | ICD-10-CM | POA: Diagnosis not present

## 2023-06-09 DIAGNOSIS — I10 Essential (primary) hypertension: Secondary | ICD-10-CM | POA: Diagnosis not present

## 2023-06-09 DIAGNOSIS — I2510 Atherosclerotic heart disease of native coronary artery without angina pectoris: Secondary | ICD-10-CM | POA: Diagnosis not present

## 2023-06-09 DIAGNOSIS — R5381 Other malaise: Secondary | ICD-10-CM | POA: Diagnosis not present

## 2023-06-09 DIAGNOSIS — D62 Acute posthemorrhagic anemia: Secondary | ICD-10-CM | POA: Diagnosis not present

## 2023-06-09 DIAGNOSIS — J9601 Acute respiratory failure with hypoxia: Secondary | ICD-10-CM | POA: Diagnosis not present

## 2023-06-09 DIAGNOSIS — G20C Parkinsonism, unspecified: Secondary | ICD-10-CM | POA: Diagnosis not present

## 2023-06-09 DIAGNOSIS — E11649 Type 2 diabetes mellitus with hypoglycemia without coma: Secondary | ICD-10-CM | POA: Diagnosis not present

## 2023-06-09 DIAGNOSIS — I480 Paroxysmal atrial fibrillation: Secondary | ICD-10-CM | POA: Diagnosis not present

## 2023-06-09 DIAGNOSIS — M6281 Muscle weakness (generalized): Secondary | ICD-10-CM | POA: Diagnosis not present

## 2023-06-09 DIAGNOSIS — D529 Folate deficiency anemia, unspecified: Secondary | ICD-10-CM | POA: Diagnosis not present

## 2023-06-09 DIAGNOSIS — R1310 Dysphagia, unspecified: Secondary | ICD-10-CM | POA: Diagnosis not present

## 2023-06-09 DIAGNOSIS — E119 Type 2 diabetes mellitus without complications: Secondary | ICD-10-CM | POA: Diagnosis not present

## 2023-06-10 DIAGNOSIS — G20C Parkinsonism, unspecified: Secondary | ICD-10-CM | POA: Diagnosis not present

## 2023-06-10 DIAGNOSIS — R1310 Dysphagia, unspecified: Secondary | ICD-10-CM | POA: Diagnosis not present

## 2023-06-10 DIAGNOSIS — D62 Acute posthemorrhagic anemia: Secondary | ICD-10-CM | POA: Diagnosis not present

## 2023-06-10 DIAGNOSIS — M6281 Muscle weakness (generalized): Secondary | ICD-10-CM | POA: Diagnosis not present

## 2023-06-10 DIAGNOSIS — D529 Folate deficiency anemia, unspecified: Secondary | ICD-10-CM | POA: Diagnosis not present

## 2023-06-10 DIAGNOSIS — E11649 Type 2 diabetes mellitus with hypoglycemia without coma: Secondary | ICD-10-CM | POA: Diagnosis not present

## 2023-06-10 DIAGNOSIS — I2510 Atherosclerotic heart disease of native coronary artery without angina pectoris: Secondary | ICD-10-CM | POA: Diagnosis not present

## 2023-06-10 DIAGNOSIS — I480 Paroxysmal atrial fibrillation: Secondary | ICD-10-CM | POA: Diagnosis not present

## 2023-06-10 DIAGNOSIS — J9601 Acute respiratory failure with hypoxia: Secondary | ICD-10-CM | POA: Diagnosis not present

## 2023-06-11 DIAGNOSIS — R1310 Dysphagia, unspecified: Secondary | ICD-10-CM | POA: Diagnosis not present

## 2023-06-11 DIAGNOSIS — M6281 Muscle weakness (generalized): Secondary | ICD-10-CM | POA: Diagnosis not present

## 2023-06-11 DIAGNOSIS — D62 Acute posthemorrhagic anemia: Secondary | ICD-10-CM | POA: Diagnosis not present

## 2023-06-11 DIAGNOSIS — G20C Parkinsonism, unspecified: Secondary | ICD-10-CM | POA: Diagnosis not present

## 2023-06-11 DIAGNOSIS — D529 Folate deficiency anemia, unspecified: Secondary | ICD-10-CM | POA: Diagnosis not present

## 2023-06-11 DIAGNOSIS — I480 Paroxysmal atrial fibrillation: Secondary | ICD-10-CM | POA: Diagnosis not present

## 2023-06-11 DIAGNOSIS — E11649 Type 2 diabetes mellitus with hypoglycemia without coma: Secondary | ICD-10-CM | POA: Diagnosis not present

## 2023-06-11 DIAGNOSIS — J9601 Acute respiratory failure with hypoxia: Secondary | ICD-10-CM | POA: Diagnosis not present

## 2023-06-11 DIAGNOSIS — I2510 Atherosclerotic heart disease of native coronary artery without angina pectoris: Secondary | ICD-10-CM | POA: Diagnosis not present

## 2023-06-12 DIAGNOSIS — G20C Parkinsonism, unspecified: Secondary | ICD-10-CM | POA: Diagnosis not present

## 2023-06-12 DIAGNOSIS — D62 Acute posthemorrhagic anemia: Secondary | ICD-10-CM | POA: Diagnosis not present

## 2023-06-12 DIAGNOSIS — I480 Paroxysmal atrial fibrillation: Secondary | ICD-10-CM | POA: Diagnosis not present

## 2023-06-12 DIAGNOSIS — M6281 Muscle weakness (generalized): Secondary | ICD-10-CM | POA: Diagnosis not present

## 2023-06-12 DIAGNOSIS — D529 Folate deficiency anemia, unspecified: Secondary | ICD-10-CM | POA: Diagnosis not present

## 2023-06-12 DIAGNOSIS — E11649 Type 2 diabetes mellitus with hypoglycemia without coma: Secondary | ICD-10-CM | POA: Diagnosis not present

## 2023-06-12 DIAGNOSIS — I2510 Atherosclerotic heart disease of native coronary artery without angina pectoris: Secondary | ICD-10-CM | POA: Diagnosis not present

## 2023-06-12 DIAGNOSIS — R1310 Dysphagia, unspecified: Secondary | ICD-10-CM | POA: Diagnosis not present

## 2023-06-12 DIAGNOSIS — J9601 Acute respiratory failure with hypoxia: Secondary | ICD-10-CM | POA: Diagnosis not present

## 2023-06-19 DIAGNOSIS — G20A1 Parkinson's disease without dyskinesia, without mention of fluctuations: Secondary | ICD-10-CM | POA: Diagnosis not present

## 2023-06-19 DIAGNOSIS — R634 Abnormal weight loss: Secondary | ICD-10-CM | POA: Diagnosis not present

## 2023-06-19 DIAGNOSIS — E785 Hyperlipidemia, unspecified: Secondary | ICD-10-CM | POA: Diagnosis not present

## 2023-06-19 DIAGNOSIS — F03C0 Unspecified dementia, severe, without behavioral disturbance, psychotic disturbance, mood disturbance, and anxiety: Secondary | ICD-10-CM | POA: Diagnosis not present

## 2023-06-19 DIAGNOSIS — I10 Essential (primary) hypertension: Secondary | ICD-10-CM | POA: Diagnosis not present

## 2023-06-19 DIAGNOSIS — R627 Adult failure to thrive: Secondary | ICD-10-CM | POA: Diagnosis not present

## 2023-07-01 DIAGNOSIS — F064 Anxiety disorder due to known physiological condition: Secondary | ICD-10-CM | POA: Diagnosis not present

## 2023-07-07 DIAGNOSIS — R627 Adult failure to thrive: Secondary | ICD-10-CM | POA: Diagnosis not present

## 2023-07-07 DIAGNOSIS — R634 Abnormal weight loss: Secondary | ICD-10-CM | POA: Diagnosis not present

## 2023-07-07 DIAGNOSIS — E785 Hyperlipidemia, unspecified: Secondary | ICD-10-CM | POA: Diagnosis not present

## 2023-07-07 DIAGNOSIS — I10 Essential (primary) hypertension: Secondary | ICD-10-CM | POA: Diagnosis not present

## 2023-07-07 DIAGNOSIS — F03C0 Unspecified dementia, severe, without behavioral disturbance, psychotic disturbance, mood disturbance, and anxiety: Secondary | ICD-10-CM | POA: Diagnosis not present

## 2023-07-07 DIAGNOSIS — G20A1 Parkinson's disease without dyskinesia, without mention of fluctuations: Secondary | ICD-10-CM | POA: Diagnosis not present

## 2023-07-08 DIAGNOSIS — F419 Anxiety disorder, unspecified: Secondary | ICD-10-CM | POA: Diagnosis not present

## 2023-07-08 DIAGNOSIS — E119 Type 2 diabetes mellitus without complications: Secondary | ICD-10-CM | POA: Diagnosis not present

## 2023-07-08 DIAGNOSIS — I10 Essential (primary) hypertension: Secondary | ICD-10-CM | POA: Diagnosis not present

## 2023-07-08 DIAGNOSIS — I480 Paroxysmal atrial fibrillation: Secondary | ICD-10-CM | POA: Diagnosis not present

## 2023-07-09 DIAGNOSIS — E785 Hyperlipidemia, unspecified: Secondary | ICD-10-CM | POA: Diagnosis not present

## 2023-07-09 DIAGNOSIS — F03C0 Unspecified dementia, severe, without behavioral disturbance, psychotic disturbance, mood disturbance, and anxiety: Secondary | ICD-10-CM | POA: Diagnosis not present

## 2023-07-09 DIAGNOSIS — R634 Abnormal weight loss: Secondary | ICD-10-CM | POA: Diagnosis not present

## 2023-07-09 DIAGNOSIS — I10 Essential (primary) hypertension: Secondary | ICD-10-CM | POA: Diagnosis not present

## 2023-07-09 DIAGNOSIS — G20A1 Parkinson's disease without dyskinesia, without mention of fluctuations: Secondary | ICD-10-CM | POA: Diagnosis not present

## 2023-07-09 DIAGNOSIS — R627 Adult failure to thrive: Secondary | ICD-10-CM | POA: Diagnosis not present

## 2023-07-15 DIAGNOSIS — F064 Anxiety disorder due to known physiological condition: Secondary | ICD-10-CM | POA: Diagnosis not present

## 2023-07-15 DIAGNOSIS — F5101 Primary insomnia: Secondary | ICD-10-CM | POA: Diagnosis not present

## 2023-07-16 DIAGNOSIS — N390 Urinary tract infection, site not specified: Secondary | ICD-10-CM | POA: Diagnosis not present

## 2023-08-05 DIAGNOSIS — I480 Paroxysmal atrial fibrillation: Secondary | ICD-10-CM | POA: Diagnosis not present

## 2023-08-05 DIAGNOSIS — E119 Type 2 diabetes mellitus without complications: Secondary | ICD-10-CM | POA: Diagnosis not present

## 2023-08-05 DIAGNOSIS — I10 Essential (primary) hypertension: Secondary | ICD-10-CM | POA: Diagnosis not present

## 2023-08-05 DIAGNOSIS — D508 Other iron deficiency anemias: Secondary | ICD-10-CM | POA: Diagnosis not present

## 2023-08-10 DIAGNOSIS — E785 Hyperlipidemia, unspecified: Secondary | ICD-10-CM | POA: Diagnosis not present

## 2023-08-10 DIAGNOSIS — F03C0 Unspecified dementia, severe, without behavioral disturbance, psychotic disturbance, mood disturbance, and anxiety: Secondary | ICD-10-CM | POA: Diagnosis not present

## 2023-08-10 DIAGNOSIS — R634 Abnormal weight loss: Secondary | ICD-10-CM | POA: Diagnosis not present

## 2023-08-10 DIAGNOSIS — I10 Essential (primary) hypertension: Secondary | ICD-10-CM | POA: Diagnosis not present

## 2023-08-10 DIAGNOSIS — G20A1 Parkinson's disease without dyskinesia, without mention of fluctuations: Secondary | ICD-10-CM | POA: Diagnosis not present

## 2023-08-10 DIAGNOSIS — R627 Adult failure to thrive: Secondary | ICD-10-CM | POA: Diagnosis not present

## 2023-08-11 DIAGNOSIS — G20A1 Parkinson's disease without dyskinesia, without mention of fluctuations: Secondary | ICD-10-CM | POA: Diagnosis not present

## 2023-08-11 DIAGNOSIS — F03C0 Unspecified dementia, severe, without behavioral disturbance, psychotic disturbance, mood disturbance, and anxiety: Secondary | ICD-10-CM | POA: Diagnosis not present

## 2023-08-11 DIAGNOSIS — R627 Adult failure to thrive: Secondary | ICD-10-CM | POA: Diagnosis not present

## 2023-08-11 DIAGNOSIS — I10 Essential (primary) hypertension: Secondary | ICD-10-CM | POA: Diagnosis not present

## 2023-08-11 DIAGNOSIS — R634 Abnormal weight loss: Secondary | ICD-10-CM | POA: Diagnosis not present

## 2023-08-11 DIAGNOSIS — E785 Hyperlipidemia, unspecified: Secondary | ICD-10-CM | POA: Diagnosis not present

## 2023-08-19 DIAGNOSIS — F064 Anxiety disorder due to known physiological condition: Secondary | ICD-10-CM | POA: Diagnosis not present

## 2023-08-19 DIAGNOSIS — F5101 Primary insomnia: Secondary | ICD-10-CM | POA: Diagnosis not present

## 2023-09-01 DIAGNOSIS — L040 Acute lymphadenitis of face, head and neck: Secondary | ICD-10-CM | POA: Diagnosis not present

## 2023-09-02 DIAGNOSIS — D508 Other iron deficiency anemias: Secondary | ICD-10-CM | POA: Diagnosis not present

## 2023-09-02 DIAGNOSIS — E119 Type 2 diabetes mellitus without complications: Secondary | ICD-10-CM | POA: Diagnosis not present

## 2023-09-02 DIAGNOSIS — I480 Paroxysmal atrial fibrillation: Secondary | ICD-10-CM | POA: Diagnosis not present

## 2023-09-02 DIAGNOSIS — L040 Acute lymphadenitis of face, head and neck: Secondary | ICD-10-CM | POA: Diagnosis not present

## 2023-09-02 DIAGNOSIS — I10 Essential (primary) hypertension: Secondary | ICD-10-CM | POA: Diagnosis not present

## 2023-09-02 DIAGNOSIS — H6122 Impacted cerumen, left ear: Secondary | ICD-10-CM | POA: Diagnosis not present

## 2023-09-14 DIAGNOSIS — N390 Urinary tract infection, site not specified: Secondary | ICD-10-CM | POA: Diagnosis not present

## 2023-09-14 DIAGNOSIS — F03C0 Unspecified dementia, severe, without behavioral disturbance, psychotic disturbance, mood disturbance, and anxiety: Secondary | ICD-10-CM | POA: Diagnosis not present

## 2023-09-14 DIAGNOSIS — L040 Acute lymphadenitis of face, head and neck: Secondary | ICD-10-CM | POA: Diagnosis not present

## 2023-09-14 DIAGNOSIS — H6122 Impacted cerumen, left ear: Secondary | ICD-10-CM | POA: Diagnosis not present

## 2023-09-14 DIAGNOSIS — R627 Adult failure to thrive: Secondary | ICD-10-CM | POA: Diagnosis not present

## 2023-09-17 DIAGNOSIS — F331 Major depressive disorder, recurrent, moderate: Secondary | ICD-10-CM | POA: Diagnosis not present

## 2023-09-22 DIAGNOSIS — H6122 Impacted cerumen, left ear: Secondary | ICD-10-CM | POA: Diagnosis not present

## 2023-09-23 DIAGNOSIS — F5101 Primary insomnia: Secondary | ICD-10-CM | POA: Diagnosis not present

## 2023-09-23 DIAGNOSIS — F064 Anxiety disorder due to known physiological condition: Secondary | ICD-10-CM | POA: Diagnosis not present

## 2023-09-30 DIAGNOSIS — F32A Depression, unspecified: Secondary | ICD-10-CM | POA: Diagnosis not present

## 2023-09-30 DIAGNOSIS — R627 Adult failure to thrive: Secondary | ICD-10-CM | POA: Diagnosis not present

## 2023-09-30 DIAGNOSIS — F419 Anxiety disorder, unspecified: Secondary | ICD-10-CM | POA: Diagnosis not present

## 2023-09-30 DIAGNOSIS — I10 Essential (primary) hypertension: Secondary | ICD-10-CM | POA: Diagnosis not present

## 2023-10-02 DIAGNOSIS — I10 Essential (primary) hypertension: Secondary | ICD-10-CM | POA: Diagnosis not present

## 2023-10-02 DIAGNOSIS — E119 Type 2 diabetes mellitus without complications: Secondary | ICD-10-CM | POA: Diagnosis not present

## 2023-10-02 DIAGNOSIS — D508 Other iron deficiency anemias: Secondary | ICD-10-CM | POA: Diagnosis not present

## 2023-10-02 DIAGNOSIS — I480 Paroxysmal atrial fibrillation: Secondary | ICD-10-CM | POA: Diagnosis not present

## 2023-10-04 DIAGNOSIS — R918 Other nonspecific abnormal finding of lung field: Secondary | ICD-10-CM | POA: Diagnosis not present

## 2023-10-04 DIAGNOSIS — R531 Weakness: Secondary | ICD-10-CM | POA: Diagnosis not present

## 2023-10-04 DIAGNOSIS — Z743 Need for continuous supervision: Secondary | ICD-10-CM | POA: Diagnosis not present

## 2023-10-04 DIAGNOSIS — G928 Other toxic encephalopathy: Secondary | ICD-10-CM | POA: Diagnosis not present

## 2023-10-04 DIAGNOSIS — N390 Urinary tract infection, site not specified: Secondary | ICD-10-CM | POA: Diagnosis not present

## 2023-10-04 DIAGNOSIS — R404 Transient alteration of awareness: Secondary | ICD-10-CM | POA: Diagnosis not present

## 2023-10-04 DIAGNOSIS — I4891 Unspecified atrial fibrillation: Secondary | ICD-10-CM | POA: Diagnosis not present

## 2023-10-04 DIAGNOSIS — R4182 Altered mental status, unspecified: Secondary | ICD-10-CM | POA: Diagnosis not present

## 2023-10-04 DIAGNOSIS — D649 Anemia, unspecified: Secondary | ICD-10-CM | POA: Diagnosis not present

## 2023-10-04 DIAGNOSIS — Z20822 Contact with and (suspected) exposure to covid-19: Secondary | ICD-10-CM | POA: Diagnosis not present

## 2023-10-04 DIAGNOSIS — Z66 Do not resuscitate: Secondary | ICD-10-CM | POA: Diagnosis not present

## 2023-10-04 DIAGNOSIS — R6521 Severe sepsis with septic shock: Secondary | ICD-10-CM | POA: Diagnosis not present

## 2023-10-04 DIAGNOSIS — R0989 Other specified symptoms and signs involving the circulatory and respiratory systems: Secondary | ICD-10-CM | POA: Diagnosis not present

## 2023-10-04 DIAGNOSIS — R0602 Shortness of breath: Secondary | ICD-10-CM | POA: Diagnosis not present

## 2023-10-04 DIAGNOSIS — R0902 Hypoxemia: Secondary | ICD-10-CM | POA: Diagnosis not present

## 2023-10-04 DIAGNOSIS — E872 Acidosis, unspecified: Secondary | ICD-10-CM | POA: Diagnosis not present

## 2023-10-04 DIAGNOSIS — I499 Cardiac arrhythmia, unspecified: Secondary | ICD-10-CM | POA: Diagnosis not present

## 2023-10-04 DIAGNOSIS — F0280 Dementia in other diseases classified elsewhere without behavioral disturbance: Secondary | ICD-10-CM | POA: Diagnosis not present

## 2023-10-04 DIAGNOSIS — R069 Unspecified abnormalities of breathing: Secondary | ICD-10-CM | POA: Diagnosis not present

## 2023-10-04 DIAGNOSIS — J984 Other disorders of lung: Secondary | ICD-10-CM | POA: Diagnosis not present

## 2023-10-04 DIAGNOSIS — I10 Essential (primary) hypertension: Secondary | ICD-10-CM | POA: Diagnosis not present

## 2023-10-04 DIAGNOSIS — J9601 Acute respiratory failure with hypoxia: Secondary | ICD-10-CM | POA: Diagnosis not present

## 2023-10-04 DIAGNOSIS — J180 Bronchopneumonia, unspecified organism: Secondary | ICD-10-CM | POA: Diagnosis not present

## 2023-10-04 DIAGNOSIS — J168 Pneumonia due to other specified infectious organisms: Secondary | ICD-10-CM | POA: Diagnosis not present

## 2023-10-04 DIAGNOSIS — I700 Atherosclerosis of aorta: Secondary | ICD-10-CM | POA: Diagnosis not present

## 2023-10-04 DIAGNOSIS — G20A1 Parkinson's disease without dyskinesia, without mention of fluctuations: Secondary | ICD-10-CM | POA: Diagnosis not present

## 2023-10-04 DIAGNOSIS — L89152 Pressure ulcer of sacral region, stage 2: Secondary | ICD-10-CM | POA: Diagnosis not present

## 2023-10-04 DIAGNOSIS — J90 Pleural effusion, not elsewhere classified: Secondary | ICD-10-CM | POA: Diagnosis not present

## 2023-10-04 DIAGNOSIS — I517 Cardiomegaly: Secondary | ICD-10-CM | POA: Diagnosis not present

## 2023-10-04 DIAGNOSIS — Z4682 Encounter for fitting and adjustment of non-vascular catheter: Secondary | ICD-10-CM | POA: Diagnosis not present

## 2023-10-04 DIAGNOSIS — R410 Disorientation, unspecified: Secondary | ICD-10-CM | POA: Diagnosis not present

## 2023-10-04 DIAGNOSIS — I482 Chronic atrial fibrillation, unspecified: Secondary | ICD-10-CM | POA: Diagnosis not present

## 2023-10-04 DIAGNOSIS — I493 Ventricular premature depolarization: Secondary | ICD-10-CM | POA: Diagnosis not present

## 2023-10-04 DIAGNOSIS — E785 Hyperlipidemia, unspecified: Secondary | ICD-10-CM | POA: Diagnosis not present

## 2023-10-04 DIAGNOSIS — E119 Type 2 diabetes mellitus without complications: Secondary | ICD-10-CM | POA: Diagnosis not present

## 2023-10-04 DIAGNOSIS — A419 Sepsis, unspecified organism: Secondary | ICD-10-CM | POA: Diagnosis not present

## 2023-10-04 DIAGNOSIS — I6782 Cerebral ischemia: Secondary | ICD-10-CM | POA: Diagnosis not present

## 2023-10-04 DIAGNOSIS — J189 Pneumonia, unspecified organism: Secondary | ICD-10-CM | POA: Diagnosis not present

## 2023-10-13 DIAGNOSIS — N390 Urinary tract infection, site not specified: Secondary | ICD-10-CM | POA: Diagnosis not present

## 2023-10-13 DIAGNOSIS — J9691 Respiratory failure, unspecified with hypoxia: Secondary | ICD-10-CM | POA: Diagnosis not present

## 2023-10-13 DIAGNOSIS — I480 Paroxysmal atrial fibrillation: Secondary | ICD-10-CM | POA: Diagnosis not present

## 2023-10-13 DIAGNOSIS — J189 Pneumonia, unspecified organism: Secondary | ICD-10-CM | POA: Diagnosis not present

## 2023-10-16 DIAGNOSIS — E119 Type 2 diabetes mellitus without complications: Secondary | ICD-10-CM | POA: Diagnosis not present

## 2023-10-19 DIAGNOSIS — Z79899 Other long term (current) drug therapy: Secondary | ICD-10-CM | POA: Diagnosis not present

## 2023-10-25 DIAGNOSIS — R4182 Altered mental status, unspecified: Secondary | ICD-10-CM | POA: Diagnosis not present

## 2023-10-25 DIAGNOSIS — Z743 Need for continuous supervision: Secondary | ICD-10-CM | POA: Diagnosis not present

## 2023-10-25 DIAGNOSIS — I959 Hypotension, unspecified: Secondary | ICD-10-CM | POA: Diagnosis not present

## 2023-10-25 DIAGNOSIS — Z20822 Contact with and (suspected) exposure to covid-19: Secondary | ICD-10-CM | POA: Diagnosis not present

## 2023-10-25 DIAGNOSIS — R457 State of emotional shock and stress, unspecified: Secondary | ICD-10-CM | POA: Diagnosis not present

## 2023-10-25 DIAGNOSIS — J189 Pneumonia, unspecified organism: Secondary | ICD-10-CM | POA: Diagnosis not present

## 2023-10-25 DIAGNOSIS — R509 Fever, unspecified: Secondary | ICD-10-CM | POA: Diagnosis not present

## 2023-10-25 DIAGNOSIS — J168 Pneumonia due to other specified infectious organisms: Secondary | ICD-10-CM | POA: Diagnosis not present

## 2023-10-25 DIAGNOSIS — R0902 Hypoxemia: Secondary | ICD-10-CM | POA: Diagnosis not present

## 2023-10-26 DIAGNOSIS — R9431 Abnormal electrocardiogram [ECG] [EKG]: Secondary | ICD-10-CM | POA: Diagnosis not present

## 2023-10-28 DIAGNOSIS — F064 Anxiety disorder due to known physiological condition: Secondary | ICD-10-CM | POA: Diagnosis not present

## 2023-10-28 DIAGNOSIS — F5101 Primary insomnia: Secondary | ICD-10-CM | POA: Diagnosis not present

## 2023-10-29 DIAGNOSIS — I4891 Unspecified atrial fibrillation: Secondary | ICD-10-CM | POA: Diagnosis not present

## 2023-10-29 DIAGNOSIS — R457 State of emotional shock and stress, unspecified: Secondary | ICD-10-CM | POA: Diagnosis not present

## 2023-10-29 DIAGNOSIS — G20A1 Parkinson's disease without dyskinesia, without mention of fluctuations: Secondary | ICD-10-CM | POA: Diagnosis not present

## 2023-10-29 DIAGNOSIS — J189 Pneumonia, unspecified organism: Secondary | ICD-10-CM | POA: Diagnosis not present

## 2023-10-29 DIAGNOSIS — Z20822 Contact with and (suspected) exposure to covid-19: Secondary | ICD-10-CM | POA: Diagnosis not present

## 2023-10-29 DIAGNOSIS — R0902 Hypoxemia: Secondary | ICD-10-CM | POA: Diagnosis not present

## 2023-10-29 DIAGNOSIS — E860 Dehydration: Secondary | ICD-10-CM | POA: Diagnosis not present

## 2023-10-29 DIAGNOSIS — T17920A Food in respiratory tract, part unspecified causing asphyxiation, initial encounter: Secondary | ICD-10-CM | POA: Diagnosis not present

## 2023-10-29 DIAGNOSIS — I959 Hypotension, unspecified: Secondary | ICD-10-CM | POA: Diagnosis not present

## 2023-10-29 DIAGNOSIS — R0602 Shortness of breath: Secondary | ICD-10-CM | POA: Diagnosis not present

## 2023-10-30 DIAGNOSIS — G20A2 Parkinson's disease without dyskinesia, with fluctuations: Secondary | ICD-10-CM | POA: Diagnosis not present

## 2023-10-30 DIAGNOSIS — L89151 Pressure ulcer of sacral region, stage 1: Secondary | ICD-10-CM | POA: Diagnosis not present

## 2023-10-30 DIAGNOSIS — M1A9XX0 Chronic gout, unspecified, without tophus (tophi): Secondary | ICD-10-CM | POA: Diagnosis not present

## 2023-10-30 DIAGNOSIS — J189 Pneumonia, unspecified organism: Secondary | ICD-10-CM | POA: Diagnosis not present

## 2023-10-31 DIAGNOSIS — J189 Pneumonia, unspecified organism: Secondary | ICD-10-CM | POA: Diagnosis not present

## 2023-11-01 DIAGNOSIS — J189 Pneumonia, unspecified organism: Secondary | ICD-10-CM | POA: Diagnosis not present

## 2023-11-02 DIAGNOSIS — R0602 Shortness of breath: Secondary | ICD-10-CM | POA: Diagnosis not present

## 2023-11-02 DIAGNOSIS — I4891 Unspecified atrial fibrillation: Secondary | ICD-10-CM | POA: Diagnosis not present

## 2023-11-02 DIAGNOSIS — J189 Pneumonia, unspecified organism: Secondary | ICD-10-CM | POA: Diagnosis not present

## 2023-11-02 DIAGNOSIS — G20A1 Parkinson's disease without dyskinesia, without mention of fluctuations: Secondary | ICD-10-CM | POA: Diagnosis not present

## 2023-11-03 DIAGNOSIS — I4891 Unspecified atrial fibrillation: Secondary | ICD-10-CM | POA: Diagnosis not present

## 2023-11-03 DIAGNOSIS — I499 Cardiac arrhythmia, unspecified: Secondary | ICD-10-CM | POA: Diagnosis not present

## 2023-11-03 DIAGNOSIS — I480 Paroxysmal atrial fibrillation: Secondary | ICD-10-CM | POA: Diagnosis not present

## 2023-11-03 DIAGNOSIS — R0600 Dyspnea, unspecified: Secondary | ICD-10-CM | POA: Diagnosis not present

## 2023-11-03 DIAGNOSIS — I493 Ventricular premature depolarization: Secondary | ICD-10-CM | POA: Diagnosis not present

## 2023-11-04 DIAGNOSIS — R0600 Dyspnea, unspecified: Secondary | ICD-10-CM | POA: Diagnosis not present

## 2023-11-04 DIAGNOSIS — J690 Pneumonitis due to inhalation of food and vomit: Secondary | ICD-10-CM | POA: Diagnosis not present

## 2023-11-05 DIAGNOSIS — R0600 Dyspnea, unspecified: Secondary | ICD-10-CM | POA: Diagnosis not present

## 2023-11-05 DIAGNOSIS — Z515 Encounter for palliative care: Secondary | ICD-10-CM | POA: Diagnosis not present

## 2023-11-05 DIAGNOSIS — J189 Pneumonia, unspecified organism: Secondary | ICD-10-CM | POA: Diagnosis not present

## 2023-11-05 DIAGNOSIS — R1310 Dysphagia, unspecified: Secondary | ICD-10-CM | POA: Diagnosis not present

## 2023-11-06 DIAGNOSIS — J189 Pneumonia, unspecified organism: Secondary | ICD-10-CM | POA: Diagnosis not present

## 2023-11-06 DIAGNOSIS — R1310 Dysphagia, unspecified: Secondary | ICD-10-CM | POA: Diagnosis not present

## 2023-11-06 DIAGNOSIS — R0600 Dyspnea, unspecified: Secondary | ICD-10-CM | POA: Diagnosis not present

## 2023-11-06 DIAGNOSIS — Z515 Encounter for palliative care: Secondary | ICD-10-CM | POA: Diagnosis not present

## 2023-11-07 DIAGNOSIS — R0600 Dyspnea, unspecified: Secondary | ICD-10-CM | POA: Diagnosis not present

## 2023-11-08 DIAGNOSIS — R0600 Dyspnea, unspecified: Secondary | ICD-10-CM | POA: Diagnosis not present

## 2023-11-09 DIAGNOSIS — R0600 Dyspnea, unspecified: Secondary | ICD-10-CM | POA: Diagnosis not present

## 2023-11-10 DIAGNOSIS — E876 Hypokalemia: Secondary | ICD-10-CM | POA: Diagnosis not present

## 2023-11-10 DIAGNOSIS — I480 Paroxysmal atrial fibrillation: Secondary | ICD-10-CM | POA: Diagnosis not present

## 2023-11-10 DIAGNOSIS — L89151 Pressure ulcer of sacral region, stage 1: Secondary | ICD-10-CM | POA: Diagnosis not present

## 2023-11-10 DIAGNOSIS — J189 Pneumonia, unspecified organism: Secondary | ICD-10-CM | POA: Diagnosis not present

## 2023-11-11 DIAGNOSIS — E876 Hypokalemia: Secondary | ICD-10-CM | POA: Diagnosis not present

## 2023-11-11 DIAGNOSIS — J189 Pneumonia, unspecified organism: Secondary | ICD-10-CM | POA: Diagnosis not present

## 2023-11-11 DIAGNOSIS — I480 Paroxysmal atrial fibrillation: Secondary | ICD-10-CM | POA: Diagnosis not present

## 2023-11-11 DIAGNOSIS — L89151 Pressure ulcer of sacral region, stage 1: Secondary | ICD-10-CM | POA: Diagnosis not present

## 2023-11-12 DIAGNOSIS — L89151 Pressure ulcer of sacral region, stage 1: Secondary | ICD-10-CM | POA: Diagnosis not present

## 2023-11-12 DIAGNOSIS — I480 Paroxysmal atrial fibrillation: Secondary | ICD-10-CM | POA: Diagnosis not present

## 2023-11-12 DIAGNOSIS — J189 Pneumonia, unspecified organism: Secondary | ICD-10-CM | POA: Diagnosis not present

## 2023-11-12 DIAGNOSIS — E876 Hypokalemia: Secondary | ICD-10-CM | POA: Diagnosis not present

## 2023-11-14 DEATH — deceased
